# Patient Record
Sex: Female | Born: 1975 | Race: Black or African American | Hispanic: No | Marital: Single | Smoking: Former smoker
Health system: Southern US, Community
[De-identification: ages and names within clinical notes are randomized; demographics above are authoritative.]

## PROBLEM LIST (undated history)

## (undated) DIAGNOSIS — Z973 Presence of spectacles and contact lenses: Secondary | ICD-10-CM

## (undated) DIAGNOSIS — E1140 Type 2 diabetes mellitus with diabetic neuropathy, unspecified: Secondary | ICD-10-CM

## (undated) DIAGNOSIS — Z9889 Other specified postprocedural states: Secondary | ICD-10-CM

## (undated) DIAGNOSIS — F32A Depression, unspecified: Secondary | ICD-10-CM

## (undated) DIAGNOSIS — M79606 Pain in leg, unspecified: Secondary | ICD-10-CM

## (undated) DIAGNOSIS — F329 Major depressive disorder, single episode, unspecified: Secondary | ICD-10-CM

## (undated) DIAGNOSIS — N920 Excessive and frequent menstruation with regular cycle: Secondary | ICD-10-CM

## (undated) DIAGNOSIS — I10 Essential (primary) hypertension: Secondary | ICD-10-CM

## (undated) DIAGNOSIS — R0600 Dyspnea, unspecified: Secondary | ICD-10-CM

## (undated) DIAGNOSIS — R112 Nausea with vomiting, unspecified: Secondary | ICD-10-CM

## (undated) DIAGNOSIS — B009 Herpesviral infection, unspecified: Secondary | ICD-10-CM

## (undated) DIAGNOSIS — F8081 Childhood onset fluency disorder: Secondary | ICD-10-CM

## (undated) DIAGNOSIS — F419 Anxiety disorder, unspecified: Secondary | ICD-10-CM

## (undated) DIAGNOSIS — R269 Unspecified abnormalities of gait and mobility: Secondary | ICD-10-CM

## (undated) DIAGNOSIS — G8929 Other chronic pain: Secondary | ICD-10-CM

## (undated) DIAGNOSIS — E1110 Type 2 diabetes mellitus with ketoacidosis without coma: Secondary | ICD-10-CM

## (undated) DIAGNOSIS — R29898 Other symptoms and signs involving the musculoskeletal system: Secondary | ICD-10-CM

## (undated) HISTORY — DX: Anxiety disorder, unspecified: F41.9

## (undated) HISTORY — DX: Unspecified abnormalities of gait and mobility: R26.9

## (undated) HISTORY — DX: Depression, unspecified: F32.A

## (undated) HISTORY — DX: Childhood onset fluency disorder: F80.81

## (undated) HISTORY — DX: Herpesviral infection, unspecified: B00.9

## (undated) HISTORY — DX: Type 2 diabetes mellitus with ketoacidosis without coma: E11.10

## (undated) HISTORY — DX: Excessive and frequent menstruation with regular cycle: N92.0

## (undated) HISTORY — DX: Other symptoms and signs involving the musculoskeletal system: R29.898

## (undated) HISTORY — DX: Major depressive disorder, single episode, unspecified: F32.9

## (undated) HISTORY — DX: Pain in leg, unspecified: M79.606

## (undated) HISTORY — DX: Other chronic pain: G89.29

## (undated) HISTORY — PX: TUBAL LIGATION: SHX77

---

## 1998-02-02 ENCOUNTER — Emergency Department (HOSPITAL_COMMUNITY): Admission: EM | Admit: 1998-02-02 | Discharge: 1998-02-02 | Payer: Self-pay | Admitting: Emergency Medicine

## 1998-03-21 ENCOUNTER — Encounter: Admission: RE | Admit: 1998-03-21 | Discharge: 1998-03-21 | Payer: Self-pay | Admitting: Family Medicine

## 1998-06-27 ENCOUNTER — Emergency Department (HOSPITAL_COMMUNITY): Admission: EM | Admit: 1998-06-27 | Discharge: 1998-06-27 | Payer: Self-pay | Admitting: Emergency Medicine

## 1998-06-28 ENCOUNTER — Inpatient Hospital Stay (HOSPITAL_COMMUNITY): Admission: EM | Admit: 1998-06-28 | Discharge: 1998-07-01 | Payer: Self-pay | Admitting: Emergency Medicine

## 1998-06-28 ENCOUNTER — Encounter: Payer: Self-pay | Admitting: Emergency Medicine

## 1998-07-02 ENCOUNTER — Encounter: Admission: RE | Admit: 1998-07-02 | Discharge: 1998-09-30 | Payer: Self-pay | Admitting: *Deleted

## 1998-08-11 ENCOUNTER — Emergency Department (HOSPITAL_COMMUNITY): Admission: EM | Admit: 1998-08-11 | Discharge: 1998-08-11 | Payer: Self-pay | Admitting: Internal Medicine

## 1998-10-23 ENCOUNTER — Emergency Department (HOSPITAL_COMMUNITY): Admission: EM | Admit: 1998-10-23 | Discharge: 1998-10-23 | Payer: Self-pay | Admitting: Emergency Medicine

## 1998-10-25 ENCOUNTER — Encounter: Admission: RE | Admit: 1998-10-25 | Discharge: 1998-10-25 | Payer: Self-pay | Admitting: Internal Medicine

## 1998-11-06 ENCOUNTER — Encounter: Admission: RE | Admit: 1998-11-06 | Discharge: 1998-11-06 | Payer: Self-pay | Admitting: Internal Medicine

## 1998-11-06 ENCOUNTER — Ambulatory Visit (HOSPITAL_COMMUNITY): Admission: RE | Admit: 1998-11-06 | Discharge: 1998-11-06 | Payer: Self-pay | Admitting: Internal Medicine

## 1999-01-29 ENCOUNTER — Encounter: Admission: RE | Admit: 1999-01-29 | Discharge: 1999-01-29 | Payer: Self-pay | Admitting: Internal Medicine

## 1999-03-31 ENCOUNTER — Encounter: Payer: Self-pay | Admitting: Emergency Medicine

## 1999-03-31 ENCOUNTER — Emergency Department (HOSPITAL_COMMUNITY): Admission: EM | Admit: 1999-03-31 | Discharge: 1999-03-31 | Payer: Self-pay | Admitting: Emergency Medicine

## 1999-05-16 ENCOUNTER — Encounter: Admission: RE | Admit: 1999-05-16 | Discharge: 1999-08-14 | Payer: Self-pay | Admitting: Endocrinology

## 1999-07-30 ENCOUNTER — Other Ambulatory Visit: Admission: RE | Admit: 1999-07-30 | Discharge: 1999-07-30 | Payer: Self-pay | Admitting: Family Medicine

## 1999-08-22 ENCOUNTER — Encounter: Admission: RE | Admit: 1999-08-22 | Discharge: 1999-08-22 | Payer: Self-pay | Admitting: Family Medicine

## 1999-08-22 ENCOUNTER — Encounter: Payer: Self-pay | Admitting: Family Medicine

## 1999-08-24 ENCOUNTER — Inpatient Hospital Stay (HOSPITAL_COMMUNITY): Admission: EM | Admit: 1999-08-24 | Discharge: 1999-08-29 | Payer: Self-pay | Admitting: Emergency Medicine

## 1999-09-02 ENCOUNTER — Encounter: Admission: RE | Admit: 1999-09-02 | Discharge: 1999-12-01 | Payer: Self-pay | Admitting: Family Medicine

## 1999-10-30 ENCOUNTER — Inpatient Hospital Stay (HOSPITAL_COMMUNITY): Admission: EM | Admit: 1999-10-30 | Discharge: 1999-11-03 | Payer: Self-pay | Admitting: Family Medicine

## 1999-10-31 ENCOUNTER — Encounter: Payer: Self-pay | Admitting: Family Medicine

## 1999-10-31 ENCOUNTER — Encounter (HOSPITAL_BASED_OUTPATIENT_CLINIC_OR_DEPARTMENT_OTHER): Payer: Self-pay | Admitting: General Surgery

## 2000-02-02 ENCOUNTER — Inpatient Hospital Stay (HOSPITAL_COMMUNITY): Admission: AD | Admit: 2000-02-02 | Discharge: 2000-02-04 | Payer: Self-pay | Admitting: Obstetrics

## 2000-02-05 ENCOUNTER — Encounter: Admission: RE | Admit: 2000-02-05 | Discharge: 2000-02-05 | Payer: Self-pay | Admitting: Obstetrics

## 2000-02-05 ENCOUNTER — Encounter: Admission: RE | Admit: 2000-02-05 | Discharge: 2000-02-05 | Payer: Self-pay | Admitting: Hematology and Oncology

## 2000-02-08 ENCOUNTER — Emergency Department (HOSPITAL_COMMUNITY): Admission: EM | Admit: 2000-02-08 | Discharge: 2000-02-08 | Payer: Self-pay | Admitting: Emergency Medicine

## 2000-03-11 ENCOUNTER — Emergency Department (HOSPITAL_COMMUNITY): Admission: EM | Admit: 2000-03-11 | Discharge: 2000-03-11 | Payer: Self-pay | Admitting: Emergency Medicine

## 2000-03-22 ENCOUNTER — Inpatient Hospital Stay (HOSPITAL_COMMUNITY): Admission: EM | Admit: 2000-03-22 | Discharge: 2000-03-24 | Payer: Self-pay | Admitting: Emergency Medicine

## 2000-03-26 ENCOUNTER — Encounter: Admission: RE | Admit: 2000-03-26 | Discharge: 2000-03-26 | Payer: Self-pay | Admitting: Family Medicine

## 2000-05-05 ENCOUNTER — Encounter: Admission: RE | Admit: 2000-05-05 | Discharge: 2000-05-05 | Payer: Self-pay | Admitting: Family Medicine

## 2000-05-10 ENCOUNTER — Encounter: Admission: RE | Admit: 2000-05-10 | Discharge: 2000-05-10 | Payer: Self-pay | Admitting: Family Medicine

## 2000-07-23 ENCOUNTER — Emergency Department (HOSPITAL_COMMUNITY): Admission: EM | Admit: 2000-07-23 | Discharge: 2000-07-23 | Payer: Self-pay | Admitting: Emergency Medicine

## 2000-09-29 ENCOUNTER — Inpatient Hospital Stay (HOSPITAL_COMMUNITY): Admission: EM | Admit: 2000-09-29 | Discharge: 2000-09-30 | Payer: Self-pay | Admitting: Emergency Medicine

## 2000-11-13 ENCOUNTER — Encounter: Payer: Self-pay | Admitting: Emergency Medicine

## 2000-11-13 ENCOUNTER — Inpatient Hospital Stay (HOSPITAL_COMMUNITY): Admission: EM | Admit: 2000-11-13 | Discharge: 2000-11-16 | Payer: Self-pay | Admitting: Emergency Medicine

## 2000-11-19 ENCOUNTER — Encounter: Admission: RE | Admit: 2000-11-19 | Discharge: 2000-11-19 | Payer: Self-pay | Admitting: Family Medicine

## 2001-01-27 ENCOUNTER — Encounter: Admission: RE | Admit: 2001-01-27 | Discharge: 2001-01-27 | Payer: Self-pay | Admitting: Family Medicine

## 2001-01-27 ENCOUNTER — Ambulatory Visit (HOSPITAL_COMMUNITY): Admission: RE | Admit: 2001-01-27 | Discharge: 2001-01-27 | Payer: Self-pay | Admitting: Sports Medicine

## 2001-01-31 ENCOUNTER — Encounter: Admission: RE | Admit: 2001-01-31 | Discharge: 2001-01-31 | Payer: Self-pay | Admitting: Family Medicine

## 2001-02-02 ENCOUNTER — Encounter: Admission: RE | Admit: 2001-02-02 | Discharge: 2001-02-02 | Payer: Self-pay | Admitting: Family Medicine

## 2001-02-09 ENCOUNTER — Encounter: Admission: RE | Admit: 2001-02-09 | Discharge: 2001-02-09 | Payer: Self-pay | Admitting: Obstetrics & Gynecology

## 2001-02-16 ENCOUNTER — Observation Stay (HOSPITAL_COMMUNITY): Admission: AD | Admit: 2001-02-16 | Discharge: 2001-02-16 | Payer: Self-pay | Admitting: Obstetrics

## 2001-02-16 ENCOUNTER — Encounter: Admission: RE | Admit: 2001-02-16 | Discharge: 2001-02-16 | Payer: Self-pay | Admitting: Obstetrics & Gynecology

## 2001-03-02 ENCOUNTER — Encounter: Admission: RE | Admit: 2001-03-02 | Discharge: 2001-03-02 | Payer: Self-pay | Admitting: *Deleted

## 2001-03-09 ENCOUNTER — Encounter: Admission: RE | Admit: 2001-03-09 | Discharge: 2001-03-09 | Payer: Self-pay | Admitting: *Deleted

## 2001-03-16 ENCOUNTER — Encounter: Admission: RE | Admit: 2001-03-16 | Discharge: 2001-03-16 | Payer: Self-pay | Admitting: *Deleted

## 2001-03-23 ENCOUNTER — Encounter: Admission: RE | Admit: 2001-03-23 | Discharge: 2001-03-23 | Payer: Self-pay | Admitting: *Deleted

## 2001-03-24 ENCOUNTER — Ambulatory Visit (HOSPITAL_COMMUNITY): Admission: RE | Admit: 2001-03-24 | Discharge: 2001-03-24 | Payer: Self-pay | Admitting: *Deleted

## 2001-03-30 ENCOUNTER — Encounter: Admission: RE | Admit: 2001-03-30 | Discharge: 2001-03-30 | Payer: Self-pay | Admitting: *Deleted

## 2001-04-06 ENCOUNTER — Encounter: Admission: RE | Admit: 2001-04-06 | Discharge: 2001-04-06 | Payer: Self-pay | Admitting: *Deleted

## 2001-04-10 ENCOUNTER — Inpatient Hospital Stay (HOSPITAL_COMMUNITY): Admission: AD | Admit: 2001-04-10 | Discharge: 2001-04-10 | Payer: Self-pay | Admitting: *Deleted

## 2001-04-12 ENCOUNTER — Ambulatory Visit (HOSPITAL_COMMUNITY): Admission: RE | Admit: 2001-04-12 | Discharge: 2001-04-12 | Payer: Self-pay | Admitting: *Deleted

## 2001-04-20 ENCOUNTER — Encounter: Admission: RE | Admit: 2001-04-20 | Discharge: 2001-04-20 | Payer: Self-pay | Admitting: *Deleted

## 2001-04-27 ENCOUNTER — Encounter: Admission: RE | Admit: 2001-04-27 | Discharge: 2001-04-27 | Payer: Self-pay | Admitting: *Deleted

## 2001-04-28 ENCOUNTER — Inpatient Hospital Stay (HOSPITAL_COMMUNITY): Admission: AD | Admit: 2001-04-28 | Discharge: 2001-04-30 | Payer: Self-pay | Admitting: *Deleted

## 2001-05-04 ENCOUNTER — Encounter: Admission: RE | Admit: 2001-05-04 | Discharge: 2001-05-04 | Payer: Self-pay | Admitting: *Deleted

## 2001-05-11 ENCOUNTER — Encounter: Admission: RE | Admit: 2001-05-11 | Discharge: 2001-05-11 | Payer: Self-pay | Admitting: *Deleted

## 2001-05-19 ENCOUNTER — Encounter: Admission: RE | Admit: 2001-05-19 | Discharge: 2001-05-19 | Payer: Self-pay | Admitting: *Deleted

## 2001-05-24 ENCOUNTER — Ambulatory Visit (HOSPITAL_COMMUNITY): Admission: RE | Admit: 2001-05-24 | Discharge: 2001-05-24 | Payer: Self-pay | Admitting: *Deleted

## 2001-05-25 ENCOUNTER — Encounter: Admission: RE | Admit: 2001-05-25 | Discharge: 2001-05-25 | Payer: Self-pay | Admitting: *Deleted

## 2001-06-01 ENCOUNTER — Encounter: Admission: RE | Admit: 2001-06-01 | Discharge: 2001-06-01 | Payer: Self-pay | Admitting: *Deleted

## 2001-06-08 ENCOUNTER — Encounter: Admission: RE | Admit: 2001-06-08 | Discharge: 2001-06-08 | Payer: Self-pay | Admitting: *Deleted

## 2001-06-15 ENCOUNTER — Observation Stay (HOSPITAL_COMMUNITY): Admission: AD | Admit: 2001-06-15 | Discharge: 2001-06-16 | Payer: Self-pay | Admitting: Obstetrics and Gynecology

## 2001-06-23 ENCOUNTER — Encounter: Admission: RE | Admit: 2001-06-23 | Discharge: 2001-06-23 | Payer: Self-pay | Admitting: *Deleted

## 2001-06-27 ENCOUNTER — Encounter: Admission: RE | Admit: 2001-06-27 | Discharge: 2001-06-27 | Payer: Self-pay | Admitting: Family Medicine

## 2001-06-29 ENCOUNTER — Encounter (HOSPITAL_COMMUNITY): Admission: RE | Admit: 2001-06-29 | Discharge: 2001-07-29 | Payer: Self-pay | Admitting: *Deleted

## 2001-06-29 ENCOUNTER — Encounter: Admission: RE | Admit: 2001-06-29 | Discharge: 2001-06-29 | Payer: Self-pay | Admitting: *Deleted

## 2001-07-07 ENCOUNTER — Encounter: Admission: RE | Admit: 2001-07-07 | Discharge: 2001-07-07 | Payer: Self-pay | Admitting: *Deleted

## 2001-07-10 ENCOUNTER — Inpatient Hospital Stay (HOSPITAL_COMMUNITY): Admission: AD | Admit: 2001-07-10 | Discharge: 2001-07-10 | Payer: Self-pay | Admitting: *Deleted

## 2001-07-13 ENCOUNTER — Encounter: Admission: RE | Admit: 2001-07-13 | Discharge: 2001-07-13 | Payer: Self-pay | Admitting: *Deleted

## 2001-07-20 ENCOUNTER — Encounter: Admission: RE | Admit: 2001-07-20 | Discharge: 2001-07-20 | Payer: Self-pay | Admitting: *Deleted

## 2001-07-21 ENCOUNTER — Encounter: Admission: RE | Admit: 2001-07-21 | Discharge: 2001-07-21 | Payer: Self-pay | Admitting: *Deleted

## 2001-07-21 ENCOUNTER — Inpatient Hospital Stay (HOSPITAL_COMMUNITY): Admission: AD | Admit: 2001-07-21 | Discharge: 2001-07-21 | Payer: Self-pay | Admitting: *Deleted

## 2001-07-27 ENCOUNTER — Encounter: Admission: RE | Admit: 2001-07-27 | Discharge: 2001-07-27 | Payer: Self-pay | Admitting: *Deleted

## 2001-08-02 ENCOUNTER — Encounter (HOSPITAL_COMMUNITY): Admission: RE | Admit: 2001-08-02 | Discharge: 2001-08-04 | Payer: Self-pay | Admitting: *Deleted

## 2001-08-05 ENCOUNTER — Inpatient Hospital Stay (HOSPITAL_COMMUNITY): Admission: AD | Admit: 2001-08-05 | Discharge: 2001-08-09 | Payer: Self-pay | Admitting: *Deleted

## 2001-09-09 ENCOUNTER — Encounter: Admission: RE | Admit: 2001-09-09 | Discharge: 2001-09-09 | Payer: Self-pay | Admitting: Family Medicine

## 2001-09-30 ENCOUNTER — Other Ambulatory Visit: Admission: RE | Admit: 2001-09-30 | Discharge: 2001-09-30 | Payer: Self-pay | Admitting: Family Medicine

## 2001-09-30 ENCOUNTER — Encounter: Admission: RE | Admit: 2001-09-30 | Discharge: 2001-09-30 | Payer: Self-pay | Admitting: Family Medicine

## 2001-12-28 ENCOUNTER — Encounter: Admission: RE | Admit: 2001-12-28 | Discharge: 2001-12-28 | Payer: Self-pay | Admitting: Family Medicine

## 2002-01-05 ENCOUNTER — Encounter: Admission: RE | Admit: 2002-01-05 | Discharge: 2002-01-05 | Payer: Self-pay | Admitting: Family Medicine

## 2002-01-10 ENCOUNTER — Encounter: Admission: RE | Admit: 2002-01-10 | Discharge: 2002-01-10 | Payer: Self-pay | Admitting: Family Medicine

## 2002-01-17 ENCOUNTER — Encounter: Admission: RE | Admit: 2002-01-17 | Discharge: 2002-01-17 | Payer: Self-pay | Admitting: Family Medicine

## 2002-01-18 ENCOUNTER — Ambulatory Visit (HOSPITAL_COMMUNITY): Admission: RE | Admit: 2002-01-18 | Discharge: 2002-01-18 | Payer: Self-pay | Admitting: Family Medicine

## 2002-01-18 ENCOUNTER — Encounter: Payer: Self-pay | Admitting: Family Medicine

## 2002-01-19 ENCOUNTER — Encounter (INDEPENDENT_AMBULATORY_CARE_PROVIDER_SITE_OTHER): Payer: Self-pay | Admitting: *Deleted

## 2002-01-19 HISTORY — PX: TUBAL LIGATION: SHX77

## 2002-01-19 LAB — CONVERTED CEMR LAB

## 2002-01-20 ENCOUNTER — Inpatient Hospital Stay (HOSPITAL_COMMUNITY): Admission: AD | Admit: 2002-01-20 | Discharge: 2002-01-25 | Payer: Self-pay | Admitting: Family Medicine

## 2002-01-20 ENCOUNTER — Encounter: Admission: RE | Admit: 2002-01-20 | Discharge: 2002-01-20 | Payer: Self-pay | Admitting: Family Medicine

## 2002-01-26 ENCOUNTER — Encounter: Admission: RE | Admit: 2002-01-26 | Discharge: 2002-04-26 | Payer: Self-pay | Admitting: Family Medicine

## 2002-02-01 ENCOUNTER — Encounter: Admission: RE | Admit: 2002-02-01 | Discharge: 2002-02-01 | Payer: Self-pay | Admitting: *Deleted

## 2002-02-15 ENCOUNTER — Encounter: Admission: RE | Admit: 2002-02-15 | Discharge: 2002-02-15 | Payer: Self-pay | Admitting: *Deleted

## 2002-02-22 ENCOUNTER — Encounter: Admission: RE | Admit: 2002-02-22 | Discharge: 2002-02-22 | Payer: Self-pay | Admitting: *Deleted

## 2002-03-08 ENCOUNTER — Encounter: Admission: RE | Admit: 2002-03-08 | Discharge: 2002-03-08 | Payer: Self-pay | Admitting: *Deleted

## 2002-03-13 ENCOUNTER — Emergency Department (HOSPITAL_COMMUNITY): Admission: EM | Admit: 2002-03-13 | Discharge: 2002-03-13 | Payer: Self-pay | Admitting: Emergency Medicine

## 2002-03-15 ENCOUNTER — Encounter: Admission: RE | Admit: 2002-03-15 | Discharge: 2002-03-15 | Payer: Self-pay | Admitting: *Deleted

## 2002-04-18 ENCOUNTER — Encounter: Payer: Self-pay | Admitting: *Deleted

## 2002-04-18 ENCOUNTER — Ambulatory Visit (HOSPITAL_COMMUNITY): Admission: RE | Admit: 2002-04-18 | Discharge: 2002-04-18 | Payer: Self-pay | Admitting: *Deleted

## 2002-04-19 ENCOUNTER — Encounter: Admission: RE | Admit: 2002-04-19 | Discharge: 2002-04-19 | Payer: Self-pay | Admitting: *Deleted

## 2002-04-26 ENCOUNTER — Encounter: Admission: RE | Admit: 2002-04-26 | Discharge: 2002-04-26 | Payer: Self-pay | Admitting: *Deleted

## 2002-05-03 ENCOUNTER — Encounter: Admission: RE | Admit: 2002-05-03 | Discharge: 2002-05-03 | Payer: Self-pay | Admitting: *Deleted

## 2002-05-31 ENCOUNTER — Encounter: Admission: RE | Admit: 2002-05-31 | Discharge: 2002-05-31 | Payer: Self-pay | Admitting: *Deleted

## 2002-06-07 ENCOUNTER — Encounter: Admission: RE | Admit: 2002-06-07 | Discharge: 2002-06-07 | Payer: Self-pay | Admitting: Obstetrics and Gynecology

## 2002-06-21 ENCOUNTER — Encounter: Admission: RE | Admit: 2002-06-21 | Discharge: 2002-06-21 | Payer: Self-pay | Admitting: *Deleted

## 2002-06-21 ENCOUNTER — Ambulatory Visit (HOSPITAL_COMMUNITY): Admission: RE | Admit: 2002-06-21 | Discharge: 2002-06-21 | Payer: Self-pay | Admitting: *Deleted

## 2002-06-28 ENCOUNTER — Encounter: Admission: RE | Admit: 2002-06-28 | Discharge: 2002-06-28 | Payer: Self-pay | Admitting: *Deleted

## 2002-07-05 ENCOUNTER — Ambulatory Visit (HOSPITAL_COMMUNITY): Admission: RE | Admit: 2002-07-05 | Discharge: 2002-07-05 | Payer: Self-pay | Admitting: *Deleted

## 2002-07-05 ENCOUNTER — Encounter: Admission: RE | Admit: 2002-07-05 | Discharge: 2002-07-05 | Payer: Self-pay | Admitting: *Deleted

## 2002-07-26 ENCOUNTER — Encounter: Admission: RE | Admit: 2002-07-26 | Discharge: 2002-07-26 | Payer: Self-pay | Admitting: *Deleted

## 2002-08-02 ENCOUNTER — Encounter: Admission: RE | Admit: 2002-08-02 | Discharge: 2002-08-02 | Payer: Self-pay | Admitting: *Deleted

## 2002-08-09 ENCOUNTER — Encounter: Admission: RE | Admit: 2002-08-09 | Discharge: 2002-08-09 | Payer: Self-pay | Admitting: *Deleted

## 2002-08-13 ENCOUNTER — Inpatient Hospital Stay (HOSPITAL_COMMUNITY): Admission: AD | Admit: 2002-08-13 | Discharge: 2002-08-13 | Payer: Self-pay | Admitting: Obstetrics & Gynecology

## 2002-08-16 ENCOUNTER — Encounter: Admission: RE | Admit: 2002-08-16 | Discharge: 2002-08-16 | Payer: Self-pay | Admitting: *Deleted

## 2002-08-16 ENCOUNTER — Encounter: Payer: Self-pay | Admitting: Obstetrics and Gynecology

## 2002-08-16 ENCOUNTER — Inpatient Hospital Stay (HOSPITAL_COMMUNITY): Admission: AD | Admit: 2002-08-16 | Discharge: 2002-08-18 | Payer: Self-pay | Admitting: Obstetrics and Gynecology

## 2002-08-23 ENCOUNTER — Encounter: Admission: RE | Admit: 2002-08-23 | Discharge: 2002-08-23 | Payer: Self-pay | Admitting: *Deleted

## 2002-08-28 ENCOUNTER — Encounter: Admission: RE | Admit: 2002-08-28 | Discharge: 2002-08-28 | Payer: Self-pay | Admitting: *Deleted

## 2002-08-30 ENCOUNTER — Encounter: Admission: RE | Admit: 2002-08-30 | Discharge: 2002-08-30 | Payer: Self-pay | Admitting: *Deleted

## 2002-09-03 ENCOUNTER — Inpatient Hospital Stay (HOSPITAL_COMMUNITY): Admission: AD | Admit: 2002-09-03 | Discharge: 2002-09-03 | Payer: Self-pay | Admitting: Family Medicine

## 2002-09-04 ENCOUNTER — Encounter: Admission: RE | Admit: 2002-09-04 | Discharge: 2002-09-04 | Payer: Self-pay | Admitting: *Deleted

## 2002-09-06 ENCOUNTER — Encounter: Admission: RE | Admit: 2002-09-06 | Discharge: 2002-09-06 | Payer: Self-pay | Admitting: *Deleted

## 2002-09-06 ENCOUNTER — Inpatient Hospital Stay (HOSPITAL_COMMUNITY): Admission: AD | Admit: 2002-09-06 | Discharge: 2002-09-09 | Payer: Self-pay | Admitting: Obstetrics and Gynecology

## 2002-09-22 ENCOUNTER — Encounter: Admission: RE | Admit: 2002-09-22 | Discharge: 2002-09-22 | Payer: Self-pay | Admitting: Family Medicine

## 2002-10-25 ENCOUNTER — Encounter: Admission: RE | Admit: 2002-10-25 | Discharge: 2002-10-25 | Payer: Self-pay | Admitting: Family Medicine

## 2002-11-08 ENCOUNTER — Encounter: Admission: RE | Admit: 2002-11-08 | Discharge: 2002-11-08 | Payer: Self-pay | Admitting: Family Medicine

## 2003-03-26 ENCOUNTER — Emergency Department (HOSPITAL_COMMUNITY): Admission: EM | Admit: 2003-03-26 | Discharge: 2003-03-26 | Payer: Self-pay | Admitting: Emergency Medicine

## 2003-05-25 ENCOUNTER — Encounter: Admission: RE | Admit: 2003-05-25 | Discharge: 2003-05-25 | Payer: Self-pay | Admitting: Family Medicine

## 2003-12-03 ENCOUNTER — Emergency Department (HOSPITAL_COMMUNITY): Admission: EM | Admit: 2003-12-03 | Discharge: 2003-12-03 | Payer: Self-pay | Admitting: Emergency Medicine

## 2004-07-08 ENCOUNTER — Emergency Department (HOSPITAL_COMMUNITY): Admission: EM | Admit: 2004-07-08 | Discharge: 2004-07-08 | Payer: Self-pay | Admitting: Emergency Medicine

## 2004-07-22 ENCOUNTER — Emergency Department (HOSPITAL_COMMUNITY): Admission: EM | Admit: 2004-07-22 | Discharge: 2004-07-22 | Payer: Self-pay | Admitting: Emergency Medicine

## 2004-10-21 ENCOUNTER — Ambulatory Visit: Payer: Self-pay | Admitting: Sports Medicine

## 2004-11-04 ENCOUNTER — Ambulatory Visit: Payer: Self-pay | Admitting: Family Medicine

## 2005-01-08 ENCOUNTER — Inpatient Hospital Stay (HOSPITAL_COMMUNITY): Admission: EM | Admit: 2005-01-08 | Discharge: 2005-01-09 | Payer: Self-pay | Admitting: Emergency Medicine

## 2005-01-08 ENCOUNTER — Ambulatory Visit: Payer: Self-pay | Admitting: Family Medicine

## 2005-01-15 ENCOUNTER — Ambulatory Visit: Payer: Self-pay | Admitting: Sports Medicine

## 2005-02-18 ENCOUNTER — Ambulatory Visit: Payer: Self-pay | Admitting: Family Medicine

## 2005-03-03 ENCOUNTER — Ambulatory Visit: Payer: Self-pay | Admitting: Family Medicine

## 2005-04-17 ENCOUNTER — Ambulatory Visit: Payer: Self-pay | Admitting: Sports Medicine

## 2005-04-21 ENCOUNTER — Emergency Department (HOSPITAL_COMMUNITY): Admission: EM | Admit: 2005-04-21 | Discharge: 2005-04-21 | Payer: Self-pay | Admitting: Emergency Medicine

## 2005-08-08 ENCOUNTER — Emergency Department (HOSPITAL_COMMUNITY): Admission: EM | Admit: 2005-08-08 | Discharge: 2005-08-08 | Payer: Self-pay | Admitting: Emergency Medicine

## 2005-08-14 ENCOUNTER — Ambulatory Visit: Payer: Self-pay | Admitting: Sports Medicine

## 2006-02-10 ENCOUNTER — Emergency Department (HOSPITAL_COMMUNITY): Admission: EM | Admit: 2006-02-10 | Discharge: 2006-02-10 | Payer: Self-pay | Admitting: Emergency Medicine

## 2006-03-18 DIAGNOSIS — G43909 Migraine, unspecified, not intractable, without status migrainosus: Secondary | ICD-10-CM | POA: Insufficient documentation

## 2006-03-18 DIAGNOSIS — E109 Type 1 diabetes mellitus without complications: Secondary | ICD-10-CM | POA: Insufficient documentation

## 2006-03-18 DIAGNOSIS — F339 Major depressive disorder, recurrent, unspecified: Secondary | ICD-10-CM | POA: Insufficient documentation

## 2006-03-19 ENCOUNTER — Encounter (INDEPENDENT_AMBULATORY_CARE_PROVIDER_SITE_OTHER): Payer: Self-pay | Admitting: *Deleted

## 2006-06-01 ENCOUNTER — Ambulatory Visit: Payer: Self-pay | Admitting: Family Medicine

## 2006-06-01 ENCOUNTER — Encounter (INDEPENDENT_AMBULATORY_CARE_PROVIDER_SITE_OTHER): Payer: Self-pay | Admitting: *Deleted

## 2006-06-02 ENCOUNTER — Telehealth: Payer: Self-pay | Admitting: Family Medicine

## 2006-06-10 ENCOUNTER — Encounter (INDEPENDENT_AMBULATORY_CARE_PROVIDER_SITE_OTHER): Payer: Self-pay | Admitting: *Deleted

## 2006-06-28 ENCOUNTER — Encounter: Payer: Self-pay | Admitting: Family Medicine

## 2006-06-30 ENCOUNTER — Telehealth: Payer: Self-pay | Admitting: *Deleted

## 2006-07-24 ENCOUNTER — Emergency Department (HOSPITAL_COMMUNITY): Admission: EM | Admit: 2006-07-24 | Discharge: 2006-07-25 | Payer: Self-pay | Admitting: Emergency Medicine

## 2006-08-02 ENCOUNTER — Ambulatory Visit: Payer: Self-pay | Admitting: Family Medicine

## 2006-08-02 ENCOUNTER — Encounter: Payer: Self-pay | Admitting: Family Medicine

## 2006-08-02 LAB — CONVERTED CEMR LAB
BUN: 9 mg/dL (ref 6–23)
CO2: 23 meq/L (ref 19–32)
Calcium: 9 mg/dL (ref 8.4–10.5)
Chloride: 106 meq/L (ref 96–112)
Creatinine, Ser: 0.72 mg/dL (ref 0.40–1.20)
Glucose, Bld: 174 mg/dL — ABNORMAL HIGH (ref 70–99)
Hgb A1c MFr Bld: 7.5 %
Potassium: 4.2 meq/L (ref 3.5–5.3)
Sodium: 140 meq/L (ref 135–145)

## 2006-08-10 ENCOUNTER — Telehealth (INDEPENDENT_AMBULATORY_CARE_PROVIDER_SITE_OTHER): Payer: Self-pay | Admitting: *Deleted

## 2006-08-16 ENCOUNTER — Encounter: Payer: Self-pay | Admitting: Family Medicine

## 2006-09-16 ENCOUNTER — Encounter (INDEPENDENT_AMBULATORY_CARE_PROVIDER_SITE_OTHER): Payer: Self-pay | Admitting: *Deleted

## 2006-11-28 ENCOUNTER — Telehealth (INDEPENDENT_AMBULATORY_CARE_PROVIDER_SITE_OTHER): Payer: Self-pay | Admitting: Family Medicine

## 2007-04-20 ENCOUNTER — Telehealth: Payer: Self-pay | Admitting: *Deleted

## 2007-04-20 ENCOUNTER — Ambulatory Visit: Payer: Self-pay | Admitting: Family Medicine

## 2007-04-20 ENCOUNTER — Encounter (INDEPENDENT_AMBULATORY_CARE_PROVIDER_SITE_OTHER): Payer: Self-pay | Admitting: *Deleted

## 2007-04-20 LAB — CONVERTED CEMR LAB
BUN: 8 mg/dL (ref 6–23)
Basophils Absolute: 0 10*3/uL (ref 0.0–0.1)
Basophils Relative: 0 % (ref 0–1)
Bilirubin Urine: NEGATIVE
Blood Glucose, Fingerstick: 314
CO2: 24 meq/L (ref 19–32)
Calcium: 8.7 mg/dL (ref 8.4–10.5)
Chloride: 103 meq/L (ref 96–112)
Creatinine, Ser: 0.54 mg/dL (ref 0.40–1.20)
Eosinophils Absolute: 0.2 10*3/uL (ref 0.0–0.7)
Eosinophils Relative: 2 % (ref 0–5)
Glucose, Bld: 262 mg/dL — ABNORMAL HIGH (ref 70–99)
Glucose, Urine, Semiquant: 500
HCT: 34 % — ABNORMAL LOW (ref 36.0–46.0)
Hemoglobin: 11.1 g/dL — ABNORMAL LOW (ref 12.0–15.0)
Hgb A1c MFr Bld: 9.5 %
Ketones, urine, test strip: NEGATIVE
Lymphocytes Relative: 43 % (ref 12–46)
Lymphs Abs: 3.9 10*3/uL (ref 0.7–4.0)
MCHC: 32.6 g/dL (ref 30.0–36.0)
MCV: 89.9 fL (ref 78.0–100.0)
Monocytes Absolute: 0.6 10*3/uL (ref 0.1–1.0)
Monocytes Relative: 7 % (ref 3–12)
Neutro Abs: 4.3 10*3/uL (ref 1.7–7.7)
Neutrophils Relative %: 48 % (ref 43–77)
Nitrite: POSITIVE
Platelets: 388 10*3/uL (ref 150–400)
Potassium: 4.1 meq/L (ref 3.5–5.3)
Protein, U semiquant: NEGATIVE
RBC: 3.78 M/uL — ABNORMAL LOW (ref 3.87–5.11)
RDW: 12.9 % (ref 11.5–15.5)
Sodium: 138 meq/L (ref 135–145)
Specific Gravity, Urine: 1.015
Urobilinogen, UA: 0.2
WBC Urine, dipstick: NEGATIVE
WBC: 9 10*3/uL (ref 4.0–10.5)
pH: 7

## 2007-04-21 ENCOUNTER — Telehealth: Payer: Self-pay | Admitting: *Deleted

## 2007-08-09 ENCOUNTER — Emergency Department (HOSPITAL_COMMUNITY): Admission: EM | Admit: 2007-08-09 | Discharge: 2007-08-09 | Payer: Self-pay | Admitting: Emergency Medicine

## 2007-10-20 ENCOUNTER — Encounter: Payer: Self-pay | Admitting: Family Medicine

## 2007-11-08 ENCOUNTER — Encounter (INDEPENDENT_AMBULATORY_CARE_PROVIDER_SITE_OTHER): Payer: Self-pay | Admitting: *Deleted

## 2008-02-27 ENCOUNTER — Observation Stay (HOSPITAL_COMMUNITY): Admission: EM | Admit: 2008-02-27 | Discharge: 2008-02-27 | Payer: Self-pay | Admitting: Emergency Medicine

## 2008-02-27 ENCOUNTER — Telehealth: Payer: Self-pay | Admitting: Family Medicine

## 2008-02-28 ENCOUNTER — Encounter: Payer: Self-pay | Admitting: *Deleted

## 2008-02-28 ENCOUNTER — Encounter (INDEPENDENT_AMBULATORY_CARE_PROVIDER_SITE_OTHER): Payer: Self-pay | Admitting: Family Medicine

## 2008-02-28 ENCOUNTER — Telehealth: Payer: Self-pay | Admitting: Family Medicine

## 2008-02-28 ENCOUNTER — Ambulatory Visit: Payer: Self-pay | Admitting: Family Medicine

## 2008-02-28 LAB — CONVERTED CEMR LAB
ALT: 8 units/L (ref 0–35)
AST: 9 units/L (ref 0–37)
Albumin: 3.9 g/dL (ref 3.5–5.2)
Alkaline Phosphatase: 92 units/L (ref 39–117)
BUN: 9 mg/dL (ref 6–23)
CO2: 24 meq/L (ref 19–32)
Calcium: 8.8 mg/dL (ref 8.4–10.5)
Chloride: 104 meq/L (ref 96–112)
Creatinine, Ser: 0.68 mg/dL (ref 0.40–1.20)
Glucose, Bld: 213 mg/dL — ABNORMAL HIGH (ref 70–99)
Hgb A1c MFr Bld: 8.7 %
Potassium: 4.2 meq/L (ref 3.5–5.3)
Sodium: 135 meq/L (ref 135–145)
Total Bilirubin: 0.3 mg/dL (ref 0.3–1.2)
Total Protein: 6.9 g/dL (ref 6.0–8.3)

## 2008-02-29 ENCOUNTER — Encounter (INDEPENDENT_AMBULATORY_CARE_PROVIDER_SITE_OTHER): Payer: Self-pay | Admitting: Family Medicine

## 2008-04-06 ENCOUNTER — Telehealth: Payer: Self-pay | Admitting: Family Medicine

## 2008-04-09 ENCOUNTER — Telehealth: Payer: Self-pay | Admitting: Family Medicine

## 2008-05-08 ENCOUNTER — Emergency Department (HOSPITAL_COMMUNITY): Admission: EM | Admit: 2008-05-08 | Discharge: 2008-05-08 | Payer: Self-pay | Admitting: Emergency Medicine

## 2008-05-15 ENCOUNTER — Encounter (INDEPENDENT_AMBULATORY_CARE_PROVIDER_SITE_OTHER): Payer: Self-pay | Admitting: Family Medicine

## 2008-05-15 ENCOUNTER — Ambulatory Visit: Payer: Self-pay | Admitting: Family Medicine

## 2008-05-15 ENCOUNTER — Encounter: Payer: Self-pay | Admitting: Family Medicine

## 2008-05-15 ENCOUNTER — Other Ambulatory Visit: Admission: RE | Admit: 2008-05-15 | Discharge: 2008-05-15 | Payer: Self-pay | Admitting: Family Medicine

## 2008-05-15 DIAGNOSIS — R110 Nausea: Secondary | ICD-10-CM | POA: Insufficient documentation

## 2008-05-15 DIAGNOSIS — N644 Mastodynia: Secondary | ICD-10-CM | POA: Insufficient documentation

## 2008-05-15 LAB — CONVERTED CEMR LAB
Beta hcg, urine, semiquantitative: NEGATIVE
Bilirubin Urine: NEGATIVE
Blood in Urine, dipstick: NEGATIVE
Glucose, Urine, Semiquant: 500
Hgb A1c MFr Bld: 9 %
Ketones, urine, test strip: NEGATIVE
Nitrite: NEGATIVE
Protein, U semiquant: NEGATIVE
Specific Gravity, Urine: >=1.03
Urobilinogen, UA: 0.2
WBC Urine, dipstick: NEGATIVE
Whiff Test: POSITIVE
pH: 5.5

## 2008-05-16 ENCOUNTER — Encounter: Payer: Self-pay | Admitting: Family Medicine

## 2008-05-16 LAB — CONVERTED CEMR LAB
ALT: 9 units/L (ref 0–35)
AST: 10 units/L (ref 0–37)
Albumin: 3.7 g/dL (ref 3.5–5.2)
Alkaline Phosphatase: 81 units/L (ref 39–117)
BUN: 11 mg/dL (ref 6–23)
Basophils Absolute: 0 10*3/uL (ref 0.0–0.1)
Basophils Relative: 0 % (ref 0–1)
CO2: 23 meq/L (ref 19–32)
Calcium: 8.7 mg/dL (ref 8.4–10.5)
Chlamydia, DNA Probe: NEGATIVE
Chloride: 104 meq/L (ref 96–112)
Creatinine, Ser: 0.67 mg/dL (ref 0.40–1.20)
Eosinophils Absolute: 0.2 10*3/uL (ref 0.0–0.7)
Eosinophils Relative: 3 % (ref 0–5)
GC Probe Amp, Genital: NEGATIVE
Glucose, Bld: 356 mg/dL — ABNORMAL HIGH (ref 70–99)
HCT: 32.5 % — ABNORMAL LOW (ref 36.0–46.0)
Hemoglobin: 10.8 g/dL — ABNORMAL LOW (ref 12.0–15.0)
Lymphocytes Relative: 41 % (ref 12–46)
Lymphs Abs: 3.1 10*3/uL (ref 0.7–4.0)
MCHC: 33.2 g/dL (ref 30.0–36.0)
MCV: 88.8 fL (ref 78.0–100.0)
Monocytes Absolute: 0.4 10*3/uL (ref 0.1–1.0)
Monocytes Relative: 5 % (ref 3–12)
Neutro Abs: 3.9 10*3/uL (ref 1.7–7.7)
Neutrophils Relative %: 51 % (ref 43–77)
Platelets: 462 10*3/uL — ABNORMAL HIGH (ref 150–400)
Potassium: 4.3 meq/L (ref 3.5–5.3)
RBC: 3.66 M/uL — ABNORMAL LOW (ref 3.87–5.11)
RDW: 12.3 % (ref 11.5–15.5)
Sodium: 138 meq/L (ref 135–145)
Total Bilirubin: 0.3 mg/dL (ref 0.3–1.2)
Total Protein: 6.7 g/dL (ref 6.0–8.3)
WBC: 7.6 10*3/uL (ref 4.0–10.5)

## 2008-05-18 ENCOUNTER — Encounter: Payer: Self-pay | Admitting: *Deleted

## 2008-05-18 ENCOUNTER — Telehealth: Payer: Self-pay | Admitting: Family Medicine

## 2008-05-18 ENCOUNTER — Ambulatory Visit: Payer: Self-pay | Admitting: Family Medicine

## 2008-05-18 DIAGNOSIS — R109 Unspecified abdominal pain: Secondary | ICD-10-CM | POA: Insufficient documentation

## 2008-05-18 LAB — CONVERTED CEMR LAB: Beta hcg, urine, semiquantitative: NEGATIVE

## 2008-05-21 ENCOUNTER — Encounter: Payer: Self-pay | Admitting: Family Medicine

## 2008-05-31 ENCOUNTER — Ambulatory Visit: Payer: Self-pay | Admitting: Family Medicine

## 2008-05-31 DIAGNOSIS — N898 Other specified noninflammatory disorders of vagina: Secondary | ICD-10-CM | POA: Insufficient documentation

## 2008-05-31 LAB — CONVERTED CEMR LAB
Bilirubin Urine: NEGATIVE
Blood in Urine, dipstick: NEGATIVE
Glucose, Urine, Semiquant: NEGATIVE
Ketones, urine, test strip: NEGATIVE
Nitrite: NEGATIVE
Protein, U semiquant: 30
Specific Gravity, Urine: 1.02
Urobilinogen, UA: 0.2
Whiff Test: POSITIVE
pH: 7

## 2008-06-01 ENCOUNTER — Encounter: Payer: Self-pay | Admitting: Family Medicine

## 2008-06-04 ENCOUNTER — Telehealth: Payer: Self-pay | Admitting: Family Medicine

## 2008-06-08 ENCOUNTER — Telehealth: Payer: Self-pay | Admitting: Family Medicine

## 2008-06-12 ENCOUNTER — Encounter: Payer: Self-pay | Admitting: Family Medicine

## 2008-08-29 ENCOUNTER — Emergency Department (HOSPITAL_COMMUNITY): Admission: EM | Admit: 2008-08-29 | Discharge: 2008-08-30 | Payer: Self-pay | Admitting: Emergency Medicine

## 2008-09-20 ENCOUNTER — Observation Stay (HOSPITAL_COMMUNITY): Admission: EM | Admit: 2008-09-20 | Discharge: 2008-09-20 | Payer: Self-pay | Admitting: Emergency Medicine

## 2008-10-27 ENCOUNTER — Encounter (INDEPENDENT_AMBULATORY_CARE_PROVIDER_SITE_OTHER): Payer: Self-pay | Admitting: *Deleted

## 2008-10-27 DIAGNOSIS — F17200 Nicotine dependence, unspecified, uncomplicated: Secondary | ICD-10-CM | POA: Insufficient documentation

## 2008-11-20 ENCOUNTER — Encounter: Payer: Self-pay | Admitting: Family Medicine

## 2009-08-07 ENCOUNTER — Emergency Department (HOSPITAL_COMMUNITY): Admission: EM | Admit: 2009-08-07 | Discharge: 2009-08-07 | Payer: Self-pay | Admitting: Emergency Medicine

## 2009-12-09 ENCOUNTER — Emergency Department (HOSPITAL_COMMUNITY): Admission: EM | Admit: 2009-12-09 | Discharge: 2009-12-09 | Payer: Self-pay | Admitting: Emergency Medicine

## 2009-12-24 ENCOUNTER — Emergency Department (HOSPITAL_COMMUNITY)
Admission: EM | Admit: 2009-12-24 | Discharge: 2009-12-24 | Payer: Self-pay | Source: Home / Self Care | Admitting: Emergency Medicine

## 2010-01-19 DIAGNOSIS — B009 Herpesviral infection, unspecified: Secondary | ICD-10-CM

## 2010-01-19 HISTORY — DX: Herpesviral infection, unspecified: B00.9

## 2010-02-18 NOTE — Progress Notes (Signed)
  went to ED. K+ was low, dehydrated, sugar was high. they gave her insulin. has laryngitis. not feeling any better today. appt for today .Marland KitchenNe[facility] CALDWELL RN  February 28, 2008 10:52 AM

## 2010-02-18 NOTE — Miscellaneous (Signed)
Summary: ED Discharge Summary   Clinical Lists Changes  MRN:  [id number]        Account #: [id number]   Name: Barbara Clark, Barbara Clark       Sex: F   Age: 35         DOB: 15-May-1975   Complaint: Nausea and vomiting      Primary Diagnosis: Hyperglycemia   Arrival Time: 02/27/2008 11:26      Discharge Time: 02/27/2008 19:18   All Providers: Dr. Linwood Dibbles, MD PSA; Ms. Narda Bonds - PA   --------------------------------------------------------------------------------------         PROVIDER: Dr. Linwood Dibbles, MD [facility]        HPI:       The patient is a 35 year old female who presents with a chief complaint of nausea       and vomiting. The history was provided by the patient. Pt only  vomited  once  on       saturday. She  called  her doctor and was told to come to the ED The nausea and       vomiting started 3 day(s) ago. The onset was gradual. The Course  is  worsening.       It is  characterized  as clear emesis. The symptoms are described as moderate to       severe. The condition is aggravated by eating. The condition is relieved by noth-       ing.  The symptoms have been associated with nausea,  while the symptoms have not       been associated with abdominal pain, diarrhea, dyspnea or fever.     14:11 02/27/2008 by Linwood Dibbles, MD [facility], Dr.           Linus Orn:       Statement: all systems negative except as marked or noted in the HPI       ENMT: Positive for sore throat.       Neuro: Positive for headache.     14:11 02/27/2008 by Linwood Dibbles, MD [facility], Dr.           Earl Lagos:       Documentation: physician reviewed/amended       Historian: patient       Last normal period: 07/20/2007       Past medical history: diabetes       Family History: CAD, diabetes, hypertension       Surgical History: tubal ligation       Social History: non-drinker, no drug abuse, current smoker w/i last 12 mos.       TB Screen: no symptoms present       Contraception: nothing       Special Needs: none      +--------------------------------------------------------------------------------+       +     Allergies         +       +---------------------+----------------------------+-----------------------------+       +    Drug    +     Reaction        +   Allergy Note      +       +---------------------+----------------------------+-----------------------------+       +    NKDA    +          +        +       +---------------------+----------------------------+-----------------------------+     14:11 02/27/2008 by Linwood Dibbles,  MD PSA, Dr.           Physical examination:       Vital signs and O2 SAT: reviewed, normal       Constitutional: well developed, well nourished, in no acute distress, calm       Head and Face: normocephalic, atraumatic       Eyes: normal appearance, no scleral icterus       ENMT: mucous membranes dry NOTE - hoarseness          Neck: supple       Cardiovascular: regular rate and rhythm, no murmur, rub, or gallop       Respiratory: breath sounds equal bilaterally, no rales, rhonchi, wheezes, or rub       Abdomen: soft, nontender, no guarding, no rebound tenderness       Extremities: pulses normal, no tenderness, no edema       Neuro: motor intact in all extremities, sensation normal , Cranial Nerves  II-XII       intact, normal coordination, normal speech       Skin: color normal       Psychiatric: no abnormalities of mood or affect     14:11 02/27/2008 by Linwood Dibbles, MD PSA, Dr.           ED Course:       CDU Observation:         I have placed the patient in CDU Observation at: 12:41 PM         For continuous medical management of: hyperglycemia     12:41 02/27/2008 by Linwood Dibbles, MD PSA, Dr.        ED Course:       Comments: Pt likely with viral type illness and hyperglycemia.  No dka.  Awaiting       ua and strep test. Blood sugar is iproving with treatment.     14:31 02/27/2008 by Linwood Dibbles, MD PSA, Dr.           Reviewed result:       Result Type: Cleda Daub: [id number]       Step Type: LAB       Procedure Name: GLUCOSE, CAPILLARY        Procedure: GLUCOSE, CAPILLARY        Result:        GLUCOSE    441       mg/dL  [54-09]      H           14:11 02/27/2008 by Linwood Dibbles, MD PSA, Dr.        Reviewed result:       Result Type: Cleda Daub: [id number]       Step Type: LAB       Procedure Name: CBC WITH DIFF        Procedure: CBC WITH DIFF        Result:        WBC COUNT    6.1       K/uL  [4.0-10.5]        RBC COUNT    3.89       MIL/uL  [3.87-5.11]        HEMOGLOBIN    12.0       g/dL  [29.5-62.1]        HEMATOCRIT    36.3       %   [  36.0-46.0]        MCV    93.2       fL  [78.0-100.0]        MCHC    33.1       g/dL  [16.1-09.6]        RDW    13.2       %   [11.5-15.5]        PLATELET COUNT   276       K/uL  [150-400]        NEUTROPHIL    49       %   [43-77]        ABS GRANULOCYTE   3.0       K/uL  [1.7-7.7]        LYMPHOCYTE    42       %   [12-46]        ABS LYMPH    2.6       K/uL  [0.7-4.0]        MONOCYTE    4       %   [3-12]        ABS MONOCYTE   0.2       K/uL  [0.1-1.0]        EOSINOPHIL    4       %   [0-5]        ABS EOS    0.3       K/uL  [0.0-0.7]        BASOPHIL    1       %   [0-1]        ABS BASO    0.0       K/uL  [0.0-0.1]           14:11 02/27/2008 by Linwood Dibbles, MD PSA, Dr.        Reviewed result:       Result Type: Cleda Daub: [id number]       Step Type: XRAY       Procedure Name: DG CHEST 2 VIEW        Procedure: DG CHEST 2 VIEW        Result:              Clinical Data: Nausea, chest pain.           CHEST - 2 VIEW           Comparison: 01/08/2005           Findings: Heart and mediastinal contours are within normal limits.        No focal opacities or effusions.  No acute bony abnormality.           IMPRESSION:        No active disease.           REF:A1 DICTATED: 02/27/2008 12:30:08           14:11 02/27/2008 by Linwood Dibbles, MD PSA, Dr.         Reviewed result:       Result Type: Cleda Daub: [id number]       Step Type: LAB       Procedure Name: BASIC METABOLIC PANEL        Procedure: BASIC METABOLIC PANEL        Procedure Notes: GFR, Est Afr Am - The eGFR has been calculated using  the  MDRD       equation.  This  calculation  has  not been validated in all clinical situations.       eGFR's persistently <60 mL/min signify possible Chronic Kidney Disease.        Result:        SODIUM    128       mEq/L  [135-145]      L        POTASSIUM    3.9       mEq/L  [3.5-5.1]        CHLORIDE    99       mEq/L  [96-112]        CARBON DIOXIDE   22       mEq/L  [19-32]        GLUCOSE    388       mg/dL  [16-10]      H        BUN    9       mg/dL  [9-60]        CREATININE    0.63       mg/dL  [4.5-4.0]        CALCIUM    8.0       mg/dL  [9.8-11.9]      L        GFR, Est Non Af Am   >60       mL/min  [>60]        GFR, Est Afr Am   >60       mL/min  [>60]           14:11 02/27/2008 by Linwood Dibbles, MD [facility], Dr.           Attending:       Supervision of:         Midlevel: I have personally performed and participated in all the services  and         procedures documented herein. I have rev iewed the findings with the patient.     14:31 02/27/2008 by Linwood Dibbles, MD [facility], Dr.           Libby Maw orders:       Verify orders: verify all orders     16:06 02/27/2008 by Linwood Dibbles, MD PSA, Dr.           Milinda Pointer electronically signed by ER Physician     16:06 02/27/2008 by Linwood Dibbles, MD PSA, Dr.        Milinda Pointer electronically signed by ER Physician     16:07 02/27/2008 by Linwood Dibbles, MD PSA, Dr.        Milinda Pointer electronically signed by ER Physician     08:03 02/28/2008 by Linwood Dibbles, MD [facility], Dr.            PROVIDER: Ms. Narda Bonds - PA        Home Medications:       Documentation: nurse pulled-forward and verified/updated          +--------------------------------------------------------------------------------+       +            Medications         +       +--------------------+------------------+------------------------+---------------+       +  Medication  +  [Medication]    +     Frequency      +  Last Dose   +       +    +  Dosage     +        +       +       +--------------------+------------------+------------------------+---------------+       +  NovoLIN N SubQ  +  37 units     + every morning      +       +       +--------------------+------------------+------------------------+---------------+       +  NovoLIN N SubQ  +  20 units     + at bed time      +       +       +--------------------+------------------+------------------------+---------------+       +  HumuLIN R Inj  +  11 units     + three times a day    +       +       +--------------------+------------------+------------------------+---------------+     19:07 02/27/2008 by Narda Bonds - PA, Ms.           ED Course:       Comments:  Received  to CDU bed 5. Presented with complaints of vomiting. Evalua-       tion revealed dehydration, hyponatremia, and hyperglycemia (known diabetic). Cur-       rently  receiving  IV hydration and is on glucostabilizer. UA neg. for infection.       Strep screen pending. Vital signs stable. Tolerating oral meds and fluids.  Will       continue to monitor.     15:56 02/27/2008 by Narda Bonds - PA, Ms.        ED Course:       Comments: Results for strep scree were negative (via both verbal and faxed report       from lab).     17:14 02/27/2008 by Narda Bonds - PA, Ms.        ED Course:       Comments: Tolerating oral fluids, repeat BMET improved. vital signs  stable.  D/C       home.     19:06 02/27/2008 by Narda Bonds - PA, Ms.           Patient disposition:       Patient disposition: Disch - Home       Primary Diagnosis: hyperglycemia       Additional diagnoses: dehydration, hypokalemia, hyponatremia, laryngitis            Counseling:  advised  of diagnosis, advised of treatment plan, advised of xray          and lab findings, advised of need for  close  follow-up, advised  of  need  to         return  for  worsening  or changing symptoms, advised of specific symptoms that         should prompt their return, patient voices understanding     19:06 02/27/2008 by Narda Bonds - PA, Ms.           Documentation completed by Responsible Physician     19:08 02/27/2008 by Narda Bonds - PA, Ms.           Medication disposition:          +--------------------------------------------------------------------------------+       +           Medications         +       +-----------+--------------+-------------+-----------+-------------+-------------+       +  Medication + [Medication] +  Frequency  + Last Dose + Medication  + PCP contact +       +  + Dosage       +      +   + disposition +      +       +-----------+--------------+-------------+-----------+-------------+-------------+       +NovoLIN N + 37 units     + every morn- +   + continue    +      +       +SubQ +        + ing      +   +        +      +       +-----------+--------------+-------------+-----------+-------------+-------------+       +NovoLIN N + 20 units     + at bed time +   + continue    +      +       +SubQ +        +      +   +        +      +       +-----------+--------------+-------------+-----------+-------------+-------------+       +HumuLIN R + 11 units     + three times +   + continue    +      +       +Inj +        + a day      +   +        +      +       +-----------+--------------+-------------+-----------+-------------+-------------+     19:07 02/27/2008 by Narda Bonds - PA, Ms.           Discharge:       Discharge Instructions:         dehydration - iv fluids, hyperglycemia, hypokalemia - with potassium supplemen-         tation, hyponatremia, laryngitis - no antibiotics, viral             +--------------------------------------------------------------------------------+       +      Referral/Appointment         +        +----------------------+--------------------+-------------------+----------------+       +  Refer Patient To:   +   Phone Number: +   Follow-up in    +  Appointment   +       +      +   +      +  Details:      +       +----------------------+--------------------+-------------------+----------------+       +  [facility]-    +   [phone]  +      +       +       +  Family Practice    +   +      +       +       +----------------------+--------------------+-------------------+----------------+     19:08 02/27/2008 by Narda Bonds - PA, Ms.

## 2010-02-18 NOTE — Assessment & Plan Note (Signed)
Summary: fu/diabetes/el   Vital Signs:  Patient Profile:   35 Years Old Female Weight:      170 pounds Pulse rate:   83 / minute BP sitting:   108 / 69  Pt. in pain?   no  Vitals Entered By: Arlyss Repress CMA, (August 02, 2006 9:13 AM)              Is Patient Diabetic? Yes    Chief Complaint:  f/up DM/refill meds.  History of Present Illness:  Type 1 Diabetes Mellitus Follow-Up      This is a 35 year old woman who presents for Type 1 diabetes mellitus follow-up.  The patient has not been seen since 2/07 and has a hx of multiple admissions for DKA.  Currently denies polyuria, polydipsia, blurred vision, self managed hypoglycemia, weight loss, weight gain, and numbness of extremities.  The patient denies the following symptoms: neuropathic pain, chest pain, vomiting, and foot ulcer.  Since the last visit the patient reports compliance with medications, not exercising regularly, and not monitoring blood glucose b/c she ran out of strips.  Since the last visit, the patient reports having had eye care by an optometrist and no foot care.  Pt admits to coming in today only b/c she was told that she would not get med refills w/out appt.  Last A1C 7.2    Past Medical History:    Reviewed history from 03/18/2006 and no changes required:       multiple hosp with DKA       NSVD 1993, 7/03, 8/04  Past Surgical History:    Reviewed history from 03/18/2006 and no changes required:       BTL - 08/20/2002   Family History:    Reviewed history from 03/18/2006 and no changes required:       Grandmother w/DM--unsure of rest b/c was foster child., Mother - drug addiction  Social History:    Reviewed history from 03/18/2006 and no changes required:       Had dgtr Theme park manager) at age 33, also 2 other young daughters Nickie [place], [place]).  Lives alone but has relationship with FOB of younger daughters.  Quit tobacco with last pregnancy; now smoking again.  Infreq EtOH.  No drugs.  Quit school in 11th  grade, obtained GED.  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   Risk Factors:  Tobacco use:  current    Cigarettes:  Yes -- 1/5 pack(s) per day   Review of Systems      See HPI   Physical Exam  General:     Well-developed,well-nourished,in no acute distress; alert,appropriate and cooperative throughout examination Eyes:     PERRL, EOMI Mouth:     Oral mucosa and oropharynx without lesions or exudates.  Neck:     No deformities, masses, or tenderness noted. Lungs:     Normal respiratory effort, chest expands symmetrically. Lungs are clear to auscultation, no crackles or wheezes. Heart:     Normal rate and regular rhythm. S1 and S2 normal without gallop, murmur, click, rub or other extra sounds. Abdomen:     Bowel sounds positive,abdomen soft and non-tender without masses, organomegaly or hernias noted. Pulses:     +2 DP and radial pulses Extremities:     No clubbing, cyanosis, edema.  No lesions.  Diabetes Management Exam:    Foot Exam (with socks and/or shoes not present):       Sensory-Pinprick/Light touch:          Left medial  foot (L-4): normal          Left dorsal foot (L-5): normal          Left lateral foot (S-1): normal          Right medial foot (L-4): normal          Right dorsal foot (L-5): normal          Right lateral foot (S-1): normal       Sensory-Monofilament:          Left foot: diminished          Right foot: diminished       Sensory-other: Diminished sensation to monofilament exam on heels bilaterally.  Otherwise WNL.       Inspection:          Left foot: normal          Right foot: normal       Nails:          Left foot: normal          Right foot: normal    Eye Exam:       Eye Exam done elsewhere          Date: 06/04/2006          Results: normal          Done by: [facility]    Impression & Recommendations:  Problem # 1:  DIABETES MELLITUS, I (ICD-250.01) Assessment: Unchanged Pt dx'd w/ DM Type I in 2000.  Has been noncompliant w/  follow-up as she hasn't been seen since 2/07.  Pt states she's been compliant w/ medication regimen- Humulin 12 units TIDAC and NPH 38 units QAM adn 20 units QPM.  Pt has not been testing CBGs as she has been out of strips.  A1C increased from 7.2--> 7.5.  Will not make changes in insulin regimen at this time as I am uncertain as to what pt's fasting CBGs vs postprandial values.  Pt to continue current insulin regimen and will begin testing sugars and bring values to next appt so we can adjust her regimen.  Pt in agreement w/ this plan.  *Following pt's departure reviewed 2+ microalbuminuria, pt will need low dose ACE for renal protection pending the result of her BMP. Orders: A1C-[facility] (16109) UA Microalbumin-[facility] (60454) Basic Met-[facility] (09811-91478) [facility]- Est Level  3 (29562)   Problem # 2:  Preventive Health Care (ICD-V70.0) Assessment: Comment Only Pt has not had a pap documented in chart since 2003.  Advised pt she is overdue for complete physical exam.  Will schedule pt to return for exam in 6-8 weeks at which time I will also adjust insulin as needed.   Patient Instructions: 1)  Please schedule a follow-up appointment in 6-8 weeks for complete physical. 2)  Check blood sugars every morning (fasting) and before each meal for a total of 4 times/day 3)  Record values in a notebook and bring to your next visit 4)  Please bring your meter to the next visit 5)  Use insulin EVERY day!  Call the office if sugars are >250 or <65. 6)  We will adjust your insulin doses based on your numbers at the next appointment. 7)  HAPPY BIRTHDAY!       Laboratory Results   Urine Tests  Date/Time Recieved: August 02, 2006 9.16  AM  Date/Time Reported: August 02, 2006 10:39 AM  Microalbumin (urine): 2+ mg/L    Comments: ...............test performed by......Marland KitchenBonnie A. [place],  [place] (ASCP)   Blood Tests   Date/Time Recieved: August 02, 2006 9:16 AM  Date/Time Reported: August 02, 2006 9:28 AM   HGBA1C:  7.5%   (Normal Range: Non-Diabetic - 3-6%   Control Diabetic - 6-8%)  Comments: ...............test performed by......Marland KitchenBonnie A. [place], [place] (ASCP)

## 2010-02-18 NOTE — Miscellaneous (Signed)
Summary: insulin refill  Clinical Lists Changes   Prescriptions: NOVOLIN N 100 UNIT/ML  SUSP (INSULIN ISOPHANE HUMAN) Inject 38 units QAM and 20 units QPM.  Dispense 1 vial.  #1 x 3   Entered and Authorized by:   Neena Rhymes  MD   Signed by:   Neena Rhymes  MD on 08/16/2006   Method used:   Electronically sent to ...       [facility] DrMarland Kitchen       [address]       [place], [place]  [address]       Ph: [phone]       Fax: [phone]   RxID:   [id number] NOVOLIN R 100 UNIT/ML INJ SOLN (INSULIN REGULAR HUMAN) inject 12 units three times a day with meals.  Dispense 1 vial  #1 x 3   Entered and Authorized by:   Neena Rhymes  MD   Signed by:   Neena Rhymes  MD on 08/16/2006   Method used:   Electronically sent to ...       [facility] Dr[facility]land Kitchen       [address]       [place], [place]  [address]       Ph: [phone]       Fax: [phone]   RxID:   [id number]

## 2010-02-18 NOTE — Progress Notes (Signed)
Summary: Rx Prob   Phone Note Call from Patient Call back at Home Phone [phone]   Caller: Patient Summary of Call: needs rxs for her insulin the pen the needles that go to it and the strips.  She talked to the pharmacy and they said it would be best if she got a whole new meter because medicaid was not paying for the One Touch Ultra. Initial call taken by: Clydell Hakim,  Jun 04, 2008 9:22 AM  Follow-up for Phone Call        will sned message to MD. Follow-up by: Theresia Lo RN,  Jun 04, 2008 9:30 AM  Additional Follow-up for Phone Call Additional follow up Details #1::        wrote script for lantus needles, prodigy meter and supplies.  also wrote script for humulin R pens (although they don't have this at [facility]).  pt will call me and tell me if she finds pens for fast acting insulin.  called pt and told her scripts ready to pick up Additional Follow-up by: Asher Muir MD,  Jun 04, 2008 12:15 PM

## 2010-02-18 NOTE — Assessment & Plan Note (Signed)
Summary: DNKA   DPG   

## 2010-02-18 NOTE — Assessment & Plan Note (Signed)
Summary: f/u,df   Vital Signs:  Patient profile:   35 year old female Height:      64 inches Weight:      171.6 pounds Temp:     98.6 degrees F Pulse rate:   89 / minute BP sitting:   110 / 78  (right arm)  Vitals Entered By: Renato Battles slade,cma CC: f/up ER visit Is Patient Diabetic? Yes  Pain Assessment Patient in pain? yes     Location: lower back Intensity: 7 Onset of pain  x 1 week   History of Present Illness: Have met this pt when she has brought her children in to see, but today is the first time I am seeing her as a pt.  Discussed:  1.  UTI--seen in ER one week ago.  diagnosed with UTI.  per notes +nitrate and large LE, but no cx was done.  Given 10 day course of cipro.  pt has been taking cipro, but still feels general malaise.  fevers up to 102, most recently yesterday morning.  sleeping alot.  still having nausea (no vomitting), bilateral lower abdominal pain.  breasts tender.  reporst that her sugars are actually lower than normal, ie, 75 this am, 138 this afternoon.  she usually runs in the 200s.  She denies any dysuria, urgency.  does endorse frequency.  she is also constipated the past few weeks.  Has had BTL and neg upreg in ER, but says that she feels like she did when she was pregnant.  LMP 04/21/08.    2.  DM--has type I DM.  takes novolin-N 40 in am and 22 in pm.  takes novolin R 12 units three times a day with meals.  last A1C8.7 on 02/28/08.  Would like to switch to the novolog flex pen because she is having trouble with air bubbles in the needle  contact [phone]   Allergies: No Known Drug Allergies  Past History:  Past Medical History:    Reviewed history from 08/02/2006 and no changes required:    multiple hosp with DKA    NSVD 1993, 7/03, 8/04  Past Surgical History:    Reviewed history from 03/18/2006 and no changes required:    BTL - 08/20/2002  Social History:    Had dgtr Theme park manager) at age 53, also 2 other young daughters Nickie Retort, [place]).  Lives  alone but has relationship with FOB of younger daughters.  Quit tobacco with last pregnancy; now smoking again.  Infreq EtOH.  No drugs.  Quit school in 11th grade, obtained GED.  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  currently in school and working.  lots of psychosocial stressors right now.  Physical Exam  General:  Laying on bed, appears uncomfortable Head:  Normocephalic and atraumatic without obvious abnormalities. No apparent alopecia or balding. Mouth:  mmm Abdomen:  mild ttp in bilateral lower quadrants.  soft, no distention, no masses, no guarding, no rebound tenderness, and no abdominal hernia.  NABS. Genitalia:  Pelvic Exam:        External: normal female genitalia without lesions or masses        Vagina: normal without lesions or masses        Cervix: normal without lesions or masses; discomfort with motion.          Adnexa: no masses or fullness, but discomfort with palpation        Uterus: no discomfort or fullness, but discomfort with palpation        Pap smear: performed; also  G/C, chlam, wet prep Extremities:  no LE edema 2+dp pulses no foot lesions Additional Exam:  vital signs reviewed    Impression & Recommendations:  Problem # 1:  NAUSEA (ICD-787.02) Assessment New Really, this is a constellation of sxs including malaise, fever, urinary frequency, breast tenderness, lower abdominal pain.  She tells me that her cbgs have been lower, but her glucose is 353 today.  no ketonuria; so I don't think this is DKA.  Her urine is normal except for glycosuria.  her exam is a little concerning (though not completely convincing) for PID.  Will check labs as below.  will go ahead and treat empirically for G/C chlam with doxy and metronidazole.  needs close f/u for her DM.  see below.  If she is still having this pain, may need to consider transvaginal u/s to look for ovarian cysts or other gyn causes.  Orders: Urinalysis-[facility] (00000) Comp Met-[facility] [phone]) CBC w/Diff-[facility] [phone])  Glucose Cap-[facility] (78469) Wet Prep- [facility] (62952) GC/Chlamydia-[facility] (87591/87491) [facility]- Est  Level 4 ([address])  Problem # 2:  DIABETES MELLITUS, I (ICD-250.01) Assessment: Deteriorated clearly this is not well-controlled.  will increase her meal covg with some SSI.  don't want to increase basal at this point, because she is having some low cbgs.  given the amount of insulin she is requiring, there is probably an insulin resistant component to this as well.  Will do SSI as written in pt instructions.  RTC soon for f/u Her updated medication list for this problem includes:    Novolin R 100 Unit/ml Inj Soln (Insulin regular human) .Marland Kitchen... At mealtime, use sliding scale as instructed    Novolin N 100 Unit/ml Susp (Insulin isophane human) ..... Inject 45 units qam and 25 units qpm.  dispense 1 vial.  Orders: A1C-[facility] (44010) [facility]- Est  Level 4 (27253)  Problem # 3:  update since visit Assessment: Comment Only reviewed labs.  plts high (400s) and slightly anemic.  probably need to recheck plts at some point and do iron studies for the anemia.  called pt to give results.  she still feels ill.  Tmax 99 since visit.  also gave her script for ibuprofen for pain.  also discussed her request to change to pens.  pt says that she just received medicaid.  was on lantus in the past and that worked well for her.  plan on seeing her for DM follow up and will plan to change her back to lantus at next visit.  Complete Medication List: 1)  Novolin R 100 Unit/ml Inj Soln (Insulin regular human) .... At mealtime, use sliding scale as instructed 2)  Novolin N 100 Unit/ml Susp (Insulin isophane human) .... Inject 45 units qam and 25 units qpm.  dispense 1 vial. 3)  Fluticasone Propionate 50 Mcg/act Susp (Fluticasone propionate) .... 2 sprays per nostril daily 4)  Doxycycline Hyclate 100 Mg Caps (Doxycycline hyclate) .Marland Kitchen.. 1 tab by mouth two times a day for 14 days 5)  Metronidazole 500 Mg Tabs (Metronidazole) .Marland Kitchen.. 1 tab by  mouth two times a day for 14 days 6)  Ibuprofen 600 Mg Tabs (Ibuprofen) .... Take 1 tab by mouth three times a day as needed pain; take with food  Other Orders: U Preg-[facility] (66440) Pap Smear-[facility] ([id number]) Urine Culture-[facility] ([id number])  Patient Instructions: 1)  It was nice to see you today.  2)  If you are not feeling considerably better by Friday, call early Friday morning for a same-day appointment.   3)  I will call you with your lab results. 4)  Finish your antibiotics. 5)  In the meantime, drink lots of water. 6)  At mealtime, if your sugar is 200-250, use 15 units.  if your sugar is 251-300, use 18 units.  if your sugar is 301-350, use 20 units.  if your sugar is >351, use 22 units (novolin R). 7)  Start taking the antibiotics I prescribed you.   8)  Please schedule a follow-up appointment within 1 month for DM and a complete physical.  Prescriptions: IBUPROFEN 600 MG TABS (IBUPROFEN) take 1 tab by mouth three times a day as needed pain; take with food  #90 x 0   Entered and Authorized by:   Asher Muir MD   Signed by:   Asher Muir MD on 05/16/2008   Method used:   Electronically to        [facility] (retail)       [address]       [place], [place]  [address]       Ph: [phone]       Fax: [phone]   RxID:   [id number] NOVOLIN R 100 UNIT/ML INJ SOLN (INSULIN REGULAR HUMAN) At mealtime, use sliding scale as instructed  #2 vials x 0   Entered and Authorized by:   Asher Muir MD   Signed by:   Asher Muir MD on 05/16/2008   Method used:   Electronically to        [facility] (retail)       [address]       [place], [place]  [address]       Ph: [phone]       Fax: [phone]   RxID:   [id number] METRONIDAZOLE 500 MG TABS (METRONIDAZOLE) 1 tab by mouth two times a day for 14 days  #28 x 0   Entered and Authorized by:   Asher Muir MD   Signed by:   Asher Muir MD on 05/15/2008   Method used:   Electronically to        [facility] (retail)       [address]       [place], [place]  [address]       Ph: [phone]       Fax: [phone]   RxID:   [id number] DOXYCYCLINE HYCLATE 100 MG CAPS (DOXYCYCLINE HYCLATE) 1 tab by mouth two times a day for 14 days  #28 x 0   Entered and Authorized by:   Asher Muir MD   Signed by:   Asher Muir MD on 05/15/2008   Method used:   Electronically to        [facility] (retail)       [address]       [place], [place]  [address]       Ph: [phone]       Fax: [phone]   RxID:   [id number]    Flowsheet View for Follow-up Visit    Weight:     171.6    Blood pressure:   110 / 78    Urine protein:       negative    Urine glucose:    500    Urine  nitrite:     negative    OB Initial Intake Information    Race: Black    Marital status: Single   Flowsheet View for Follow-up Visit    Weight:     171.6    Blood pressure:   110 / 78    Urine Protein:     negative    Urine Glucose:   500    Urine Nitrite:     negative     Laboratory Results   Urine Tests  Date/Time Received: May 15, 2008 3:03 PM  Date/Time Reported: May 15, 2008 3:15 PM   Routine Urinalysis   Color: yellow Appearance: Clear Glucose: 500   (Normal Range: Negative) Bilirubin: negative   (Normal Range: Negative) Ketone: negative   (Normal Range: Negative) Spec. Gravity: >=1.030   (Normal Range: 1.003-1.035) Blood: negative   (Normal Range: Negative) pH: 5.5   (Normal Range: 5.0-8.0) Protein: negative   (Normal Range: Negative) Urobilinogen: 0.2   (Normal Range: 0-1) Nitrite: negative   (Normal Range: Negative) Leukocyte Esterace: negative   (Normal Range: Negative)    Urine HCG: negative Comments: ...............test performed by......Marland KitchenBonnie A. [place], [place] (ASCP)   Blood Tests   Date/Time Received: May 15, 2008 3:48 PM  Date/Time Reported: May 15, 2008 5:15 PM  Time patient last ate: 12:30  HGBA1C: 9.0%   (Normal Range: Non-Diabetic - 3-6%   Control Diabetic - 6-8%)  Comments: ...............test performed by......Marland KitchenBonnie A. [place], [place] (ASCP)  Date/Time Received: May 15, 2008 3:48 PM  Date/Time Reported: May 15, 2008 5:16 PM   [employer] Source: vag WBC/hpf: 1-5 Bacteria/hpf: 2+ cocci with 1+ rods Clue cells/hpf: moderate  Positive whiff Yeast/hpf: none Trichomonas/hpf: none Comments: ...............test performed by......Marland KitchenBonnie A. [place], [place] (ASCP)

## 2010-02-18 NOTE — Assessment & Plan Note (Signed)
Summary: tb test/tabori/el  Nurse Visit      PPD Application    Vaccine Type: PPD    Site: left forearm    Mfr: Sanofi Pasteur    Dose: 0.1 ml    Route: ID    Given by: AMY MARTIN RN    Exp. Date: 03/18/2008    Lot #: [id number]   Orders Added: 1)  TB Skin Test [86580] 2)  Admin 1st Vaccine [phone]

## 2010-02-18 NOTE — Letter (Signed)
Summary: Handout Printed  Printed Handout:  - Family Medicine Patient Instructions 

## 2010-02-18 NOTE — Letter (Signed)
Summary: Out of [facility]  [address]   [place], [place] [address]   Phone: [phone]  Fax: [phone]    November 08, 2007   Student:  Max Sane    To Whom It May Concern:   For Medical reasons, please excuse the above named student from school for the following dates:  Start:   November 08, 2007  End:    November 10, 2007 4:49 PM   If you need additional information, please feel free to contact our office.   Sincerely,    Timon Geissinger CMA    ****This is a legal document and cannot be tampered with.  Schools are authorized to verify all information and to do so accordingly.

## 2010-02-18 NOTE — Letter (Signed)
Summary: Generic Letter  [facility]  [address]   [place], [place] [address]   Phone: [phone]  Fax: [phone]    02/29/2008  STORIE HEFFERN [address] [place], [place]  [address]  Dear Ms. Dowson,  Your labs from 02/28/08 were all normal except for your glucose being elevated at 213. Your sodium was low in the hospital but is now normal. If you have any questions, please call our office.   Sincerely,   Cyndia Bent MD [facility]  Appended Document: Generic Letter mailed

## 2010-02-18 NOTE — Letter (Signed)
Summary: Generic Letter  [facility]  [address]   [place], [place] [address]   Phone: [phone]  Fax: [phone]    11/20/2008  Barbara Clark [address] [place], [place]  [address]  Dear Ms. Nogales,   You have not been seen in our office for your diabetes since May.  Please schedule a follow up visit by calling our front office to discuss your diabetes.  I look forward to seeing you.        Sincerely,   Asher Muir MD  Appended Document: Generic Letter mailed

## 2010-02-18 NOTE — Progress Notes (Signed)
Summary: Triage   Phone Note Call from Patient Call back at Home Phone [phone]   Reason for Call: Talk to Nurse Summary of Call: pt is calling c/o feeling bad since last week, thinks it is due to her insulin.  wants to be seen today, but would also like to speak with rn. Initial call taken by: Haydee Salter,  February 27, 2008 10:24 AM  Follow-up for Phone Call        left message Follow-up by: Golden Circle RN,  February 27, 2008 10:27 AM  Additional Follow-up for Phone Call Additional follow up Details #1::        returning call Additional Follow-up by: Haydee Salter,  February 27, 2008 10:33 AM    Additional Follow-up for Phone Call Additional follow up Details #2::    am cbg was over 500. has been sick & having high cbgs since last week. states last time she was like this she was admitted to hospital. sent to ED now. states she has someone to drive her & will go now. voice was high pitched and she was barely able to talk. denies breathing problems. Follow-up by: Golden Circle RN,  February 27, 2008 10:54 AM

## 2010-02-18 NOTE — Assessment & Plan Note (Signed)
Summary: not feeling any better-see Tues notes   Vital Signs:  Patient profile:   35 year old female Weight:      174.2 pounds Temp:     98.2 degrees F oral Pulse rate:   75 / minute BP sitting:   111 / 76  (left arm)  Vitals Entered By: Arlyss Repress CMA, (May 18, 2008 10:11 AM) CC: swollen feet yesterday. stomach pain. Pain Assessment Patient in pain? yes     Location: stomach Intensity: 6   History of Present Illness: 35yo with DM1.  Here b/c not feeling well for 10 days now.  Initially went to ED and given cipro for UTI (no cx obtained).  Also had negative UPreg and CBC at that visit.  Came to our clinic 4/28 b/c not any better.  Evaluated with CMET, CBC, GC/CT, and urine culture.  All studies essentially negative.  Pt treated empirically for PID with doxycycline and flagyl.  Still not any better despite taking cipro, doxycycline,a nd flagyl now.  Breast swelling and tenderness- feels like she is pregnant.  Just had period 4/3 and is s/p BTL and had negative UPreg 4/20.  Also with lower abdominal pain and bloating.  Has only had 1 BM in 2 weeks which is unusual for her.  No blood in stool, no obstipation.  Has not filled miralax prescription given from Dr. Lafonda Mosses on 4/28.  No fevers, no vag d/c, normal appetite.  Still wonders if she could be pregnant or have a tubal pregnancy.  DM1- sugars running "normal" which is unusal for her since they are usually 200-300.  She has been eating normally so she is not sure why her sugars are suddenly "normal."  Allergies: No Known Drug Allergies  Past History:  Past Medical History:    Reviewed history from 08/02/2006 and no changes required:    multiple hosp with DKA    NSVD 1993, 7/03, 8/04  Past Surgical History:    Reviewed history from 03/18/2006 and no changes required:    BTL - 08/20/2002  Physical Exam  General:  alert, laying back on exam table, appears uncomfortable (out of proportion). Head:  normocephalic and  atraumatic.   Mouth:  moist mucous membranes Abdomen:  +BS, soft, ttp in lower quadrants, no rebound, no guarding, no masses Rectal:  no external abnormalities, no hemorrhoids, normal sphincter tone, no masses, no tenderness, and no fissures.  No fecal impaction. Extremities:  no swelling. Neurologic:  alert & oriented X3.     Impression & Recommendations:  Problem # 1:  ABDOMINAL PAIN, LOWER (ICD-789.09) prior w/u has included CBC, CMET, U/A, urine culture, GC, CT, and Upreg. not better now with cipro, doxycycline, and flagyl. repeat upreg today is negative. I think her lower abdominal pain is coming from constipation.  will start miralax 1 cap daily until bowels are more regular.  If no better on return visit, consider pelvic US and checking HIV since her symptoms are so vague.    Her updated medication list for this problem includes:    Ibuprofen 600 Mg Tabs (Ibuprofen) .Marland Kitchen... Take 1 tab by mouth three times a day as needed pain; take with food  Orders: U Preg-[facility] (81025) [facility]- Est  Level 4 ([address])  Complete Medication List: 1)  Novolin R 100 Unit/ml Inj Soln (Insulin regular human) .... At mealtime, use sliding scale as instructed 2)  Novolin N 100 Unit/ml Susp (Insulin isophane human) .... Inject 45 units qam and 25 units qpm.  dispense 1  vial. 3)  Fluticasone Propionate 50 Mcg/act Susp (Fluticasone propionate) .... 2 sprays per nostril daily 4)  Doxycycline Hyclate 100 Mg Caps (Doxycycline hyclate) .Marland Kitchen.. 1 tab by mouth two times a day for 14 days 5)  Metronidazole 500 Mg Tabs (Metronidazole) .Marland Kitchen.. 1 tab by mouth two times a day for 14 days 6)  Ibuprofen 600 Mg Tabs (Ibuprofen) .... Take 1 tab by mouth three times a day as needed pain; take with food 7)  Miralax Powd (Polyethylene glycol 3350) .Marland Kitchen.. 17gm mixed in water or juice once daily until bowels are regular.  give 1 large jar.  Patient Instructions: 1)  We will check one more pregnancy test just to be sure- yours on april  20th was negative. 2)  If the pregnancy test is negative, I think you could be having stomach pain from constipation.  Miralax laxative will be sent to your pharmacy.  Take 1 scoop daily until your bowel movements become regular. 3)  Follow up with Dr. Lafonda Mosses in 1 week if you are not better- or sooner for any severe abdominal pain, stomach swelling, fevers, or vomiting. Prescriptions: MIRALAX  POWD (POLYETHYLENE GLYCOL 3350) 17gm mixed in water or juice once daily until bowels are regular.  Give 1 large jar.  #1 x 0   Entered and Authorized by:   Sylvan Cheese MD   Signed by:   Sylvan Cheese MD on 05/18/2008   Method used:   Electronically to        [facility] (retail)       [address]       [place], [place]  [address]       Ph: [phone]       Fax: [phone]   RxID:   [id number]   Laboratory Results   Urine Tests  Date/Time Received: May 18, 2008 10:51 AM  Date/Time Reported: May 18, 2008 10:54 AM     Urine HCG: negative Comments: ...........test performed by...........Marland KitchenTerese Door, CMA

## 2010-02-18 NOTE — Progress Notes (Signed)
Summary: Phone call - Needs appt  Phone Note Outgoing Call   Call placed by: AMY MARTIN RN,  Jun 02, 2006 10:24 AM Call placed to: Patient Summary of Call: Phone call to pt - left her a message to call me back so that I can set her up with an appt to have a check up with for her DM ASAP. Initial call taken by: AMY MARTIN RN,  Jun 02, 2006 10:31 AM  Follow-up for Phone Call        Phone call to pt - left message to return my call about scheduling an appt to f/u her DM.   Follow-up by: AMY MARTIN RN,  Jun 04, 2006 4:06 PM  Additional Follow-up for Phone Call Additional follow up Details #1::        Phone call to pt- left message to return my call.  Will send letter regarding this because I have been unable to reach pt by phone.  Additional Follow-up by: AMY MARTIN RN,  Jun 10, 2006 9:59 AM

## 2010-02-18 NOTE — Consult Note (Signed)
[facility]: [facility]   Imported By: Knox Royalty 11/02/2007 14:56:42  _____________________________________________________________________  External Attachment:    Type:   Image     Comment:   External Document  Appended Document: [facility]     Clinical Lists Changes  Observations: Added new observation of DBT EY CK DT: 11/05/2007 (11/05/2007 21:21) Added new observation of DMEYEEXAMNXT: 11/04/2008 (11/05/2007 21:21) Added new observation of DIAB EYE EX: normal (11/05/2007 21:21) Added new observation of PAP DUE: 01/20/2003 (11/05/2007 21:21) Added new observation of DB FT EX DUE: 08/02/2007 (11/05/2007 21:21) Added new observation of HGBA1CNXTDUE: 07/20/2007 (11/05/2007 21:21) Added new observation of MICRALB DUE: 08/02/2007 (11/05/2007 21:21) Added new observation of CREATNXTDUE: 04/19/2008 (11/05/2007 21:21) Added new observation of POTASSIUMDUE: 04/19/2008 (11/05/2007 21:21)      Diabetic Eye Exam Date:  11/05/2007 Diabetes Eye Exam Result:  normal Diabetes Eye Exam Due:  1 yr

## 2010-02-18 NOTE — Miscellaneous (Signed)
Summary: [name]  Clinical Lists Changes  Problems: Added new problem of [name] (ICD-305.1) 

## 2010-02-18 NOTE — Progress Notes (Signed)
Summary: triage   Phone Note Call from Patient Call back at Home Phone [phone]   Caller: Patient Summary of Call: still not feeling well and still in pain.  wants to come in today Initial call taken by: De Nurse,  May 18, 2008 9:03 AM  Follow-up for Phone Call        states she does not feel any better. wants to be sen this am. appt with Dr. [name] at 10am Follow-up by: Golden Circle RN,  May 18, 2008 9:09 AM

## 2010-02-18 NOTE — Letter (Signed)
Summary: Out of Work  [facility]  [address]   [place], [place] [address]   Phone: [phone]  Fax: [phone]    February 28, 2008   Employee:  ALICJA EVERITT    To Whom It May Concern:   For Medical reasons, please excuse the above named employee from work for the following dates:  Start:   02/28/08 to 03/01/08   If you need additional information, please feel free to contact our office.         Sincerely,    Cyndia Bent MD

## 2010-02-18 NOTE — Assessment & Plan Note (Signed)
Summary: ed yesterday- not any better-see notes   Vital Signs:  Patient Profile:   35 Years Old Female Height:     64 inches (162.56 cm) Weight:      165.8 pounds (75.36 kg) BMI:     28.56 Temp:     98.1 degrees F (36.72 degrees C) oral Pulse rate:   97 / minute BP sitting:   111 / 74  (left arm) Cuff size:   regular  Vitals Entered By: Garen Grams LPN (February 28, 2008 1:53 PM)                 PCP:  Asher Muir MD  Chief Complaint:  Ed f/u for Nausea and hyperglycemia.  History of Present Illness: 35 yo with type I DM here for ED FU for hyperglycemia and nausea.  Child was recently sick - the 2 youngest had colds. She started feeling sick on Friday and lost her voice. Still does not have her voice back yet. Was taking theraflu and tylenol. Vomited Saturday x 2. Has not vomited since then. Ate a little yesterday but appetite is decreased. Is drinking liquids well. Had cough on Friday but really not much today. Fever on Friday or Saturday but not checked with thermometer. Chest hurts from vomiting and only when you press on it. Sugars have been elevated. This weekend they were in the 600s at home and at the hospital they were in the 400s.  Left work on Friday because could not even work. Feels really tired. Was having runny nose but now nose is really stopped up.  Period started this AM and has BTL. 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 Also has multiple children at home that she has to care for. Feels a little bit better than yesterday. Drinking water.   Patient was also seen by Dr. Leveda Anna as she did not ellaborate much on what we could do to help her and what symptoms were bothering her the most. He was able to spend more time with her to discuss her busy daily schedule. She is getting minimal sleep and is unable to function with work, school, and children. She was advised to think about this and figure out something to decrease or cut  out of her schedule to allow her the time she needs to stay well.     Current Allergies: No known allergies       Physical Exam  General:     Laying on bed, Looks miserable, hoarse voice  Head:     normocephalic and atraumatic.   Ears:     TMs normal bilaterally  Mouth:     pharynx pink and moist, no erythema, and no exudates.   Neck:     supple and no masses.   Chest Wall:     Tender over upper chest wall on palpation Lungs:     normal respiratory effort, normal breath sounds, no crackles, and no wheezes.   Heart:     normal rate, regular rhythm, no murmur, no gallop, and no rub.   Abdomen:     soft and non-tender.   Extremities:     no edema  Neurologic:     Seems very sleepy but is oriented and will talk when asked questions but not very freely.  Psych:     Seems depressed. Tearful at times.     Impression & Recommendations:  Problem # 1:  URI (ICD-465.9) Assessment:  New Has this which has caused hoarseness as well as may also be developing a sinus infection. Sent in Rx for fluticasone nasal spray to see if this helps her symptoms. Orders: [facility]- Est  Level 4 (57322)   Problem # 2:  HYPONATREMIA (ICD-276.1) Assessment: New Likely due to high glucose yesterday in hospital. Will recheck today.   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 Advised her to take a few days off of work and consider her schedule and what she really can possibly do and still stay healthy.  Orders: [facility]- Est  Level 4 (15176)   Problem # 4:  DIABETES MELLITUS, I (ICD-250.01) Assessment: Improved AIC is 8.7 down from 9.5. Will increase insulin by 2 units in AM and 2 units in PM.  Her updated medication list for this problem includes:    Novolin R 100 Unit/ml  Inj Soln (Insulin regular human) ..... Inject 12 units three times a day with meals.  dispense 1 vial    Novolin N 100 Unit/ml Susp (Insulin isophane human) ..... Inject 40 units qam and 22 units qpm.  dispense 1 vial.  Orders: A1C-[facility] (16073) Glucose Cap-[facility] (71062) [facility]- Est  Level 4 (99214)   Complete Medication List: 1)  Novolin R 100 Unit/ml Inj Soln (Insulin regular human) .... Inject 12 units three times a day with meals.  dispense 1 vial 2)  Novolin N 100 Unit/ml Susp (Insulin isophane human) .... Inject 40 units qam and 22 units qpm.  dispense 1 vial. 3)  Fluticasone Propionate 50 Mcg/act Susp (Fluticasone propionate) .... 2 sprays per nostril daily   Patient Instructions: 1)  I think you either have a bad cold or you have a sinus infection. 2)  Try the fluticasone nasal spray daily to help with your infection. 3)  Also try nasal saline drops for your congestion. 4)  I am giving you a note for work.  5)  Try to drink lots of liquids.  6)  We will check some more blood work today. 7)  If you are still feeling poorly on Thursday come back to see [place].  8)  Take Tylenol as needed headache.  9)  Increase your two times a day insulin to 2 more units every morning and 22 units in the evening.  10)  Make an appointment with Dr. Lafonda Mosses (her next available) to follow up your diabetes. It would be best to come back in 1 week if you are able to do this. 11)  Try to relax and think about your schedule. You need regular meals and sleep to function.    Prescriptions: FLUTICASONE PROPIONATE 50 MCG/ACT SUSP (FLUTICASONE PROPIONATE) 2 sprays per nostril daily  #1 x 1   Entered and Authorized by:   Cyndia Bent MD   Signed by:   Cyndia Bent MD on 02/28/2008   Method used:   Electronically to        [facility] (retail)       [address]       [place], [place]  [address]       Ph: [phone]       Fax: [phone]   RxID:   [id number]    Laboratory Results   Blood Tests  Date/Time Received: February 28, 2008 1:56 PM  Date/Time Reported: February 28, 2008 2:13 PM   HGBA1C: 8.7%   (Normal Range: Non-Diabetic - 3-6%   Control Diabetic - 6-8%)  Comments: ..........test performed by.....Marland KitchenMarland KitchenDeseree Blount (SMA)                 confirmed by...Marland KitchenMarland KitchenMarland Kitchen Bonnie A. [place], [place] (ASCP)      Appended Document: ed yesterday- not any better-see notes Also in ED CBC was normal and electrolytes were fine except for Na. Given patients sugar today and her normal electrolytes on her CMET she is not in DKA.

## 2010-02-18 NOTE — Letter (Signed)
Summary: Out of Work  [facility]  [address]   [place], [place] [address]   Phone: [phone]  Fax: [phone]    November 08, 2007   Employee:  Barbara Clark    To Whom It May Concern:   For Medical reasons, please excuse the above named employee from work for the following dates:  Start:    End:    If you need additional information, please feel free to contact our office.         Sincerely,    Lillia Pauls CMA

## 2010-02-18 NOTE — Letter (Signed)
Summary: Generic Letter  [facility]  [address]   [place], [place] [address]   Phone: [phone]  Fax: [phone]    05/21/2008  Barbara Clark [address] [place], [place]  [address]  Dear Ms. Boyar,   I just wanted to let you know that your pap smear was normal.  Please call me if you have any questions or concerns.            Sincerely,   Asher Muir MD  Appended Document: Generic Letter mailed

## 2010-02-18 NOTE — Assessment & Plan Note (Signed)
Summary: f/u dm,df   Vital Signs:  Patient profile:   35 year old female Height:      64 inches Weight:      169.8 pounds Temp:     98.2 degrees F oral Pulse rate:   87 / minute BP sitting:   101 / 71  (left arm)  Vitals Entered By: Alphia Kava (May 31, 2008 1:33 PM) CC: f/u DM, abd pain, vag irritation Is Patient Diabetic? Yes   History of Present Illness: 1.  abd pain-- resolved.  never took miralax, but did give self enema.  feels much better.  back to work.  2.  dm--wants to change to lantus pen.  now has medicaid again.  did well on lantus before.  currently on 45 novolin am and 25 in pm.  also on SSI with meals.  sugars 48-348 past week  3.  vaginal irritation--self treated for yeast infection with antifungal cream, but still with some irritation.  also with dysuria and +frequency  Habits & Providers     Tobacco Status: current     Cigarette Packs/Day: 0.5  Allergies: No Known Drug Allergies  Physical Exam  General:  alert, laying back on exam table, appears uncomfortable (out of proportion). Lungs:  Normal respiratory effort, chest expands symmetrically. Lungs are clear to auscultation, no crackles or wheezes. Heart:  Normal rate and regular rhythm. S1 and S2 normal without gallop, murmur, click, rub or other extra sounds. Abdomen:  Bowel sounds positive,abdomen soft and non-tender without masses, organomegaly or hernias noted.  Diabetes Management Exam:    Foot Exam (with socks and/or shoes not present):       Sensory-Pinprick/Light touch:          Left medial foot (L-4): diminished          Left dorsal foot (L-5): diminished          Left lateral foot (S-1): normal          Right medial foot (L-4): diminished          Right dorsal foot (L-5): diminished          Right lateral foot (S-1): normal       Sensory-Monofilament:          Left foot: diminished          Right foot: diminished       Inspection:          Left foot: normal          Right foot: normal        Nails:          Left foot: normal          Right foot: normal   Impression & Recommendations:  Problem # 1:  ABDOMINAL PAIN, LOWER (ICD-789.09) Assessment Improved resolved.  told to take miralax as needed for constipation The following medications were removed from the medication list:    Ibuprofen 600 Mg Tabs (Ibuprofen) .Marland Kitchen... Take 1 tab by mouth three times a day as needed pain; take with food  Orders: [facility]- Est  Level 4 ([address])  Problem # 2:  VAGINAL DISCHARGE (ICD-623.5) Assessment: New wet prep consistent with BV.  prescribed flagyl Orders: Wet Prep- [facility] (60454) [facility]- Est  Level 4 (09811)  Problem # 3:  DYSURIA (ICD-788.1) Assessment: New u/a not convincing for infection.  will await cx results The following medications were removed from the medication list:    Metronidazole 500 Mg Tabs (Metronidazole) .Marland Kitchen... 1 tab  by mouth two times a day for 14 days Her updated medication list for this problem includes:    Doxycycline Hyclate 100 Mg Caps (Doxycycline hyclate) .Marland Kitchen... 1 tab by mouth two times a day for 14 days    Metronidazole 500 Mg Tabs (Metronidazole) .Marland Kitchen... 1 tab by mouth two times a day for 7 days for bacterial vaginosis  Orders: Urinalysis-[facility] (00000) Urine Culture-[facility] ([address]-78295) [facility]- Est  Level 4 ([address])  Problem # 4:  DIABETES MELLITUS, I (ICD-250.01) Assessment: Unchanged stop nph.  start lantus 60 and increase to get to fasting cbg <140.  see instructions below.  rtc few weeks.  continue same ssi.   The following medications were removed from the medication list:    Novolin N 100 Unit/ml Susp (Insulin isophane human) ..... Inject 45 units qam and 25 units qpm.  dispense 1 vial. Her updated medication list for this problem includes:    Novolin R 100 Unit/ml Inj Soln (Insulin regular human) .Marland Kitchen... At mealtime, use sliding scale as instructed    Lantus Solostar 100 Unit/ml Soln (Insulin glargine) ..... Inject 60 units subcutaneously each morning.  each  morning that your fasting blood sugar is >140, increase your dose by one unit.  Orders: [facility]- Est  Level 4 (99214)  Complete Medication List: 1)  Novolin R 100 Unit/ml Inj Soln (Insulin regular human) .... At mealtime, use sliding scale as instructed 2)  Fluticasone Propionate 50 Mcg/act Susp (Fluticasone propionate) .... 2 sprays per nostril daily 3)  Doxycycline Hyclate 100 Mg Caps (Doxycycline hyclate) .Marland Kitchen.. 1 tab by mouth two times a day for 14 days 4)  Onetouch Ultra Test Strp (Glucose blood) .... Test blood sugars 4 x a day 5)  Lantus Solostar 100 Unit/ml Soln (Insulin glargine) .... Inject 60 units subcutaneously each morning.  each morning that your fasting blood sugar is >140, increase your dose by one unit. 6)  Metronidazole 500 Mg Tabs (Metronidazole) .Marland Kitchen.. 1 tab by mouth two times a day for 7 days for bacterial vaginosis  Patient Instructions: 1)  It was nice to see you today.   I'm glad you are feeling better. 2)  Please schedule a follow-up appointment in 2-3 weeks.  3)  For your diabetes, STOP your novolin-N.  START taking lantus.  Take 60 units daily in the morning.  Each day that your morning fasting blood sugar is more than 140, increase your lantus by one unit. 4)  Keep using the same sliding scale insulin as before.  5)  Please keep a record of your sugars, the times and the amount of sliding scale insulin you are using.   6)  I will call you if you need antibiotics Prescriptions: METRONIDAZOLE 500 MG TABS (METRONIDAZOLE) 1 tab by mouth two times a day for 7 days for bacterial vaginosis  #14 x 0   Entered and Authorized by:   Asher Muir MD   Signed by:   Asher Muir MD on 05/31/2008   Method used:   Electronically to        [facility] (retail)       [address]       [place], [place]  [address]       Ph: [phone]       Fax: [phone]   RxID:   [id number] LANTUS SOLOSTAR 100 UNIT/ML SOLN (INSULIN GLARGINE) inject  60 units subcutaneously each morning.  Each morning that your fasting blood sugar is >  140, increase your dose by one unit.  #1 box x 12   Entered and Authorized by:   Asher Muir MD   Signed by:   Asher Muir MD on 05/31/2008   Method used:   Electronically to        [facility] (retail)       [address]       [place], [place]  [address]       Ph: [phone]       Fax: [phone]   RxID:   [id number] [place] TEST  STRP (GLUCOSE BLOOD) test blood sugars 4 X a day  #3 bottles x 12   Entered and Authorized by:   Asher Muir MD   Signed by:   Asher Muir MD on 05/31/2008   Method used:   Electronically to        [facility] (retail)       [address]       [place], [place]  [address]       Ph: [phone]       Fax: [phone]   RxID:   [id number]   Laboratory Results   Urine Tests  Date/Time Received: May 31, 2008 2:10 PM  Date/Time Reported: May 31, 2008 2:31 PM   Routine Urinalysis   Color: yellow Appearance: Clear Glucose: negative   (Normal Range: Negative) Bilirubin: negative   (Normal Range: Negative) Ketone: negative   (Normal Range: Negative) Spec. Gravity: 1.020   (Normal Range: 1.003-1.035) Blood: negative   (Normal Range: Negative) pH: 7.0   (Normal Range: 5.0-8.0) Protein: 30   (Normal Range: Negative) Urobilinogen: 0.2   (Normal Range: 0-1) Nitrite: negative   (Normal Range: Negative) Leukocyte Esterace: small   (Normal Range: Negative)  Urine Microscopic WBC/HPF: 5-15 RBC/HPF: 1-5 Bacteria/HPF: 1+ Mucous/HPF: 2+ Epithelial/HPF: 10-20    Comments: ...............test performed by......Marland KitchenBonnie A. [place], [place] (ASCP)  Date/Time Received: May 31, 2008 2:10 PM  Date/Time Reported: May 31, 2008 2:32 PM   [employer] Source: vag WBC/hpf: 10-20 Bacteria/hpf: 3+   predominately cocci with 1+ rods Clue cells/hpf: few  Positive whiff Yeast/hpf:  none Trichomonas/hpf: none Comments: ...............test performed by......Marland KitchenBonnie A. [place], [place] (ASCP)

## 2010-02-18 NOTE — Letter (Signed)
Summary: Out of School  [facility]  [address]   [place], [place] [address]   Phone: [phone]  Fax: [phone]    May 18, 2008   Student:  Barbara Clark    To Whom It May Concern:   For Medical reasons, please excuse the above named student from school for the following dates:  Start:   May 16, 2008  End:    May 19, 2008  If you need additional information, please feel free to contact our office.   Sincerely,    Arlyss Repress CMA,    ****This is a legal document and cannot be tampered with.  Schools are authorized to verify all information and to do so accordingly.

## 2010-02-18 NOTE — Progress Notes (Signed)
Summary: Triage   Phone Note Call from Patient Call back at Home Phone [phone]   Reason for Call: Talk to Nurse Summary of Call: requesting to speak with RN, sts she was given an antibiotic and since shes been taking it she has had a severe headache, wants to know what she can take with the medication? Initial call taken by: Knox Royalty,  April 21, 2007 2:12 PM  Follow-up for Phone Call        pt states she has  headache and has tried tylenol but it is not helping. suggested she try ibuprofen with food. push fluids. Follow-up by: Theresia Lo RN,  April 21, 2007 2:23 PM

## 2010-02-18 NOTE — Progress Notes (Signed)
Summary: Triage   Phone Note Call from Patient Call back at Home Phone [phone]   Reason for Call: Talk to Nurse Summary of Call: Pt is requesting to be seen this afternoon for a temp and she states everytime she blows her nose it will bleed and then she will get an awful HA. Initial call taken by: Haydee Salter,  April 20, 2007 2:56 PM  Follow-up for Phone Call        states she got a nosebleed & HA yesterday. Has been sick since the weekend. using otc. has diabetes. states her cbg was in 500s. last temp was 102. told to come in now. informed of wait. pt ok with that  Follow-up by: Golden Circle RN,  April 20, 2007 2:59 PM

## 2010-02-18 NOTE — Progress Notes (Signed)
   Phone Note Outgoing Call   Summary of Call: would you all mind calling this patient and asking her to make an appt with me within the next 6 weeks.  I just refilled her insulin, but she needs to come in for an office visit.  Thanks.  Darl Pikes Initial call taken by: Asher Muir MD,  April 09, 2008 8:59 AM      Appended Document:  pt will make an appt.

## 2010-02-18 NOTE — Letter (Signed)
Summary: Out of Work  [facility]  [address]   [place], [place] [address]   Phone: [phone]  Fax: [phone]    February 28, 2008   Employee:  ELIORA NIENHUIS    To Whom It May Concern:   For Medical reasons, please excuse the above named employee from work for the following dates:  02/28/08 - 02/29/08  If you need additional information, please feel free to contact our office.         Sincerely,    Cyndia Bent MD

## 2010-04-01 LAB — POCT URINALYSIS DIPSTICK
Bilirubin Urine: NEGATIVE
Glucose, UA: 500 mg/dL — AB
Hgb urine dipstick: NEGATIVE
Ketones, ur: NEGATIVE mg/dL
Nitrite: NEGATIVE
Protein, ur: NEGATIVE mg/dL
Specific Gravity, Urine: 1.01 (ref 1.005–1.030)
Urobilinogen, UA: 1 mg/dL (ref 0.0–1.0)
pH: 7 (ref 5.0–8.0)

## 2010-04-01 LAB — POCT RAPID STREP A (OFFICE): Streptococcus, Group A Screen (Direct): POSITIVE — AB

## 2010-04-01 LAB — POCT PREGNANCY, URINE: Preg Test, Ur: NEGATIVE

## 2010-04-01 LAB — GLUCOSE, CAPILLARY: Glucose-Capillary: 347 mg/dL — ABNORMAL HIGH (ref 70–99)

## 2010-04-02 LAB — POCT I-STAT, CHEM 8
BUN: 15 mg/dL (ref 6–23)
Calcium, Ion: 1.1 mmol/L — ABNORMAL LOW (ref 1.12–1.32)
Chloride: 102 mEq/L (ref 96–112)
Creatinine, Ser: 0.7 mg/dL (ref 0.4–1.2)
Glucose, Bld: 317 mg/dL — ABNORMAL HIGH (ref 70–99)
HCT: 40 % (ref 36.0–46.0)
Hemoglobin: 13.6 g/dL (ref 12.0–15.0)
Potassium: 3.9 mEq/L (ref 3.5–5.1)
Sodium: 136 mEq/L (ref 135–145)
TCO2: 25 mmol/L (ref 0–100)

## 2010-04-02 LAB — GLUCOSE, CAPILLARY: Glucose-Capillary: 178 mg/dL — ABNORMAL HIGH (ref 70–99)

## 2010-04-02 LAB — POCT PREGNANCY, URINE: Preg Test, Ur: NEGATIVE

## 2010-04-05 LAB — GLUCOSE, CAPILLARY
Glucose-Capillary: 249 mg/dL — ABNORMAL HIGH (ref 70–99)
Glucose-Capillary: 320 mg/dL — ABNORMAL HIGH (ref 70–99)
Glucose-Capillary: 379 mg/dL — ABNORMAL HIGH (ref 70–99)
Glucose-Capillary: 434 mg/dL — ABNORMAL HIGH (ref 70–99)

## 2010-04-05 LAB — BASIC METABOLIC PANEL
BUN: 10 mg/dL (ref 6–23)
CO2: 26 mEq/L (ref 19–32)
Calcium: 8.8 mg/dL (ref 8.4–10.5)
Chloride: 102 mEq/L (ref 96–112)
Creatinine, Ser: 0.84 mg/dL (ref 0.4–1.2)
GFR calc Af Amer: >60 mL/min (ref >60)
GFR calc non Af Amer: >60 mL/min (ref >60)
Glucose, Bld: 416 mg/dL — ABNORMAL HIGH (ref 70–99)
Potassium: 4.4 mEq/L (ref 3.5–5.1)
Sodium: 136 mEq/L (ref 135–145)

## 2010-04-05 LAB — URINALYSIS, ROUTINE W REFLEX MICROSCOPIC
Bilirubin Urine: NEGATIVE
Glucose, UA: >1000 mg/dL — AB
Ketones, ur: 40 mg/dL — AB
Leukocytes, UA: NEGATIVE
Nitrite: NEGATIVE
Protein, ur: NEGATIVE mg/dL
Specific Gravity, Urine: 1.041 — ABNORMAL HIGH (ref 1.005–1.030)
Urobilinogen, UA: 1 mg/dL (ref 0.0–1.0)
pH: 6.5 (ref 5.0–8.0)

## 2010-04-05 LAB — CBC
HCT: 32.5 % — ABNORMAL LOW (ref 36.0–46.0)
Hemoglobin: 10.8 g/dL — ABNORMAL LOW (ref 12.0–15.0)
MCH: 30.9 pg (ref 26.0–34.0)
MCHC: 33.3 g/dL (ref 30.0–36.0)
MCV: 92.5 fL (ref 78.0–100.0)
Platelets: 318 10*3/uL (ref 150–400)
RBC: 3.51 MIL/uL — ABNORMAL LOW (ref 3.87–5.11)
RDW: 13.7 % (ref 11.5–15.5)
WBC: 8.5 10*3/uL (ref 4.0–10.5)

## 2010-04-05 LAB — DIFFERENTIAL
Basophils Absolute: 0.1 10*3/uL (ref 0.0–0.1)
Basophils Relative: 1 % (ref 0–1)
Eosinophils Absolute: 0.2 10*3/uL (ref 0.0–0.7)
Eosinophils Relative: 2 % (ref 0–5)
Lymphocytes Relative: 42 % (ref 12–46)
Lymphs Abs: 3.6 10*3/uL (ref 0.7–4.0)
Monocytes Absolute: 0.6 10*3/uL (ref 0.1–1.0)
Monocytes Relative: 7 % (ref 3–12)
Neutro Abs: 4.1 10*3/uL (ref 1.7–7.7)
Neutrophils Relative %: 48 % (ref 43–77)

## 2010-04-05 LAB — POCT PREGNANCY, URINE: Preg Test, Ur: NEGATIVE

## 2010-04-05 LAB — URINE MICROSCOPIC-ADD ON

## 2010-04-25 LAB — COMPREHENSIVE METABOLIC PANEL
ALT: 16 U/L (ref 0–35)
AST: 16 U/L (ref 0–37)
Albumin: 3.1 g/dL — ABNORMAL LOW (ref 3.5–5.2)
Alkaline Phosphatase: 92 U/L (ref 39–117)
BUN: 6 mg/dL (ref 6–23)
CO2: 29 mEq/L (ref 19–32)
Calcium: 8.5 mg/dL (ref 8.4–10.5)
Chloride: 97 mEq/L (ref 96–112)
Creatinine, Ser: 0.88 mg/dL (ref 0.4–1.2)
GFR calc Af Amer: >60 mL/min (ref >60)
GFR calc non Af Amer: >60 mL/min (ref >60)
Glucose, Bld: 340 mg/dL — ABNORMAL HIGH (ref 70–99)
Potassium: 4.3 mEq/L (ref 3.5–5.1)
Sodium: 134 mEq/L — ABNORMAL LOW (ref 135–145)
Total Bilirubin: 0.9 mg/dL (ref 0.3–1.2)
Total Protein: 7.1 g/dL (ref 6.0–8.3)

## 2010-04-25 LAB — CBC
HCT: 33.1 % — ABNORMAL LOW (ref 36.0–46.0)
Hemoglobin: 10.9 g/dL — ABNORMAL LOW (ref 12.0–15.0)
MCHC: 32.8 g/dL (ref 30.0–36.0)
MCV: 93.2 fL (ref 78.0–100.0)
Platelets: 306 10*3/uL (ref 150–400)
RBC: 3.56 MIL/uL — ABNORMAL LOW (ref 3.87–5.11)
RDW: 12.9 % (ref 11.5–15.5)
WBC: 10.7 10*3/uL — ABNORMAL HIGH (ref 4.0–10.5)

## 2010-04-25 LAB — DIFFERENTIAL
Basophils Absolute: 0 10*3/uL (ref 0.0–0.1)
Basophils Relative: 0 % (ref 0–1)
Eosinophils Absolute: 0 10*3/uL (ref 0.0–0.7)
Eosinophils Relative: 0 % (ref 0–5)
Lymphocytes Relative: 10 % — ABNORMAL LOW (ref 12–46)
Lymphs Abs: 1 10*3/uL (ref 0.7–4.0)
Monocytes Absolute: 1 10*3/uL (ref 0.1–1.0)
Monocytes Relative: 9 % (ref 3–12)
Neutro Abs: 8.7 10*3/uL — ABNORMAL HIGH (ref 1.7–7.7)
Neutrophils Relative %: 81 % — ABNORMAL HIGH (ref 43–77)

## 2010-04-25 LAB — URINE MICROSCOPIC-ADD ON

## 2010-04-25 LAB — POCT PREGNANCY, URINE: Preg Test, Ur: NEGATIVE

## 2010-04-25 LAB — URINE CULTURE: Colony Count: >=100000

## 2010-04-25 LAB — URINALYSIS, ROUTINE W REFLEX MICROSCOPIC
Bilirubin Urine: NEGATIVE
Glucose, UA: >1000 mg/dL — AB
Ketones, ur: >80 mg/dL — AB
Nitrite: NEGATIVE
Protein, ur: 100 mg/dL — AB
Specific Gravity, Urine: 1.024 (ref 1.005–1.030)
Urobilinogen, UA: 1 mg/dL (ref 0.0–1.0)
pH: 6 (ref 5.0–8.0)

## 2010-04-25 LAB — GLUCOSE, CAPILLARY
Glucose-Capillary: 241 mg/dL — ABNORMAL HIGH (ref 70–99)
Glucose-Capillary: 334 mg/dL — ABNORMAL HIGH (ref 70–99)

## 2010-04-25 LAB — LIPASE, BLOOD: Lipase: 11 U/L (ref 11–59)

## 2010-04-26 LAB — BASIC METABOLIC PANEL
BUN: 9 mg/dL (ref 6–23)
CO2: 28 mEq/L (ref 19–32)
Calcium: 8.3 mg/dL — ABNORMAL LOW (ref 8.4–10.5)
Chloride: 101 mEq/L (ref 96–112)
Creatinine, Ser: 0.62 mg/dL (ref 0.4–1.2)
GFR calc Af Amer: >60 mL/min (ref >60)
GFR calc non Af Amer: >60 mL/min (ref >60)
Glucose, Bld: 306 mg/dL — ABNORMAL HIGH (ref 70–99)
Potassium: 4 mEq/L (ref 3.5–5.1)
Sodium: 132 mEq/L — ABNORMAL LOW (ref 135–145)

## 2010-04-26 LAB — GLUCOSE, CAPILLARY
Glucose-Capillary: 218 mg/dL — ABNORMAL HIGH (ref 70–99)
Glucose-Capillary: 243 mg/dL — ABNORMAL HIGH (ref 70–99)
Glucose-Capillary: 300 mg/dL — ABNORMAL HIGH (ref 70–99)
Glucose-Capillary: 354 mg/dL — ABNORMAL HIGH (ref 70–99)

## 2010-04-26 LAB — URINALYSIS, ROUTINE W REFLEX MICROSCOPIC
Bilirubin Urine: NEGATIVE
Glucose, UA: >1000 mg/dL — AB
Hgb urine dipstick: NEGATIVE
Ketones, ur: NEGATIVE mg/dL
Leukocytes, UA: NEGATIVE
Nitrite: NEGATIVE
Protein, ur: NEGATIVE mg/dL
Specific Gravity, Urine: 1.039 — ABNORMAL HIGH (ref 1.005–1.030)
Urobilinogen, UA: 1 mg/dL (ref 0.0–1.0)
pH: 6.5 (ref 5.0–8.0)

## 2010-04-26 LAB — URINE MICROSCOPIC-ADD ON

## 2010-04-26 LAB — PREGNANCY, URINE: Preg Test, Ur: NEGATIVE

## 2010-04-30 LAB — URINALYSIS, ROUTINE W REFLEX MICROSCOPIC
Bilirubin Urine: NEGATIVE
Glucose, UA: NEGATIVE mg/dL
Ketones, ur: NEGATIVE mg/dL
Nitrite: POSITIVE — AB
Protein, ur: 30 mg/dL — AB
Specific Gravity, Urine: 1.012 (ref 1.005–1.030)
Urobilinogen, UA: 1 mg/dL (ref 0.0–1.0)
pH: 6 (ref 5.0–8.0)

## 2010-04-30 LAB — POCT I-STAT, CHEM 8
BUN: 7 mg/dL (ref 6–23)
Calcium, Ion: 0.98 mmol/L — ABNORMAL LOW (ref 1.12–1.32)
Chloride: 104 mEq/L (ref 96–112)
Creatinine, Ser: 0.9 mg/dL (ref 0.4–1.2)
Glucose, Bld: 130 mg/dL — ABNORMAL HIGH (ref 70–99)
HCT: 35 % — ABNORMAL LOW (ref 36.0–46.0)
Hemoglobin: 11.9 g/dL — ABNORMAL LOW (ref 12.0–15.0)
Potassium: 3.3 mEq/L — ABNORMAL LOW (ref 3.5–5.1)
Sodium: 137 mEq/L (ref 135–145)
TCO2: 22 mmol/L (ref 0–100)

## 2010-04-30 LAB — GLUCOSE, CAPILLARY
Glucose-Capillary: 138 mg/dL — ABNORMAL HIGH (ref 70–99)
Glucose-Capillary: 242 mg/dL — ABNORMAL HIGH (ref 70–99)
Glucose-Capillary: 353 mg/dL — ABNORMAL HIGH (ref 70–99)

## 2010-04-30 LAB — CBC
HCT: 33.6 % — ABNORMAL LOW (ref 36.0–46.0)
Hemoglobin: 11.1 g/dL — ABNORMAL LOW (ref 12.0–15.0)
MCHC: 33 g/dL (ref 30.0–36.0)
MCV: 93.3 fL (ref 78.0–100.0)
Platelets: 307 10*3/uL (ref 150–400)
RBC: 3.6 MIL/uL — ABNORMAL LOW (ref 3.87–5.11)
RDW: 12.9 % (ref 11.5–15.5)
WBC: 9.8 10*3/uL (ref 4.0–10.5)

## 2010-04-30 LAB — URINE MICROSCOPIC-ADD ON

## 2010-04-30 LAB — DIFFERENTIAL
Basophils Absolute: 0 10*3/uL (ref 0.0–0.1)
Basophils Relative: 0 % (ref 0–1)
Eosinophils Absolute: 0 10*3/uL (ref 0.0–0.7)
Eosinophils Relative: 0 % (ref 0–5)
Lymphocytes Relative: 10 % — ABNORMAL LOW (ref 12–46)
Lymphs Abs: 0.9 10*3/uL (ref 0.7–4.0)
Monocytes Absolute: 0.7 10*3/uL (ref 0.1–1.0)
Monocytes Relative: 7 % (ref 3–12)
Neutro Abs: 8.1 10*3/uL — ABNORMAL HIGH (ref 1.7–7.7)
Neutrophils Relative %: 83 % — ABNORMAL HIGH (ref 43–77)

## 2010-04-30 LAB — POCT PREGNANCY, URINE: Preg Test, Ur: NEGATIVE

## 2010-05-06 LAB — BASIC METABOLIC PANEL
BUN: 4 mg/dL — ABNORMAL LOW (ref 6–23)
BUN: 5 mg/dL — ABNORMAL LOW (ref 6–23)
BUN: 9 mg/dL (ref 6–23)
CO2: 22 mEq/L (ref 19–32)
CO2: 26 mEq/L (ref 19–32)
CO2: 26 mEq/L (ref 19–32)
Calcium: 7.7 mg/dL — ABNORMAL LOW (ref 8.4–10.5)
Calcium: 7.8 mg/dL — ABNORMAL LOW (ref 8.4–10.5)
Calcium: 8 mg/dL — ABNORMAL LOW (ref 8.4–10.5)
Chloride: 105 mEq/L (ref 96–112)
Chloride: 106 mEq/L (ref 96–112)
Chloride: 99 mEq/L (ref 96–112)
Creatinine, Ser: 0.51 mg/dL (ref 0.4–1.2)
Creatinine, Ser: 0.53 mg/dL (ref 0.4–1.2)
Creatinine, Ser: 0.63 mg/dL (ref 0.4–1.2)
GFR calc Af Amer: >60 mL/min (ref >60)
GFR calc Af Amer: >60 mL/min (ref >60)
GFR calc Af Amer: >60 mL/min (ref >60)
GFR calc non Af Amer: >60 mL/min (ref >60)
GFR calc non Af Amer: >60 mL/min (ref >60)
GFR calc non Af Amer: >60 mL/min (ref >60)
Glucose, Bld: 134 mg/dL — ABNORMAL HIGH (ref 70–99)
Glucose, Bld: 190 mg/dL — ABNORMAL HIGH (ref 70–99)
Glucose, Bld: 388 mg/dL — ABNORMAL HIGH (ref 70–99)
Potassium: 3.4 mEq/L — ABNORMAL LOW (ref 3.5–5.1)
Potassium: 3.9 mEq/L (ref 3.5–5.1)
Potassium: 3.9 mEq/L (ref 3.5–5.1)
Sodium: 128 mEq/L — ABNORMAL LOW (ref 135–145)
Sodium: 134 mEq/L — ABNORMAL LOW (ref 135–145)
Sodium: 135 mEq/L (ref 135–145)

## 2010-05-06 LAB — DIFFERENTIAL
Basophils Absolute: 0 10*3/uL (ref 0.0–0.1)
Basophils Relative: 1 % (ref 0–1)
Eosinophils Absolute: 0.3 10*3/uL (ref 0.0–0.7)
Eosinophils Relative: 4 % (ref 0–5)
Lymphocytes Relative: 42 % (ref 12–46)
Lymphs Abs: 2.6 10*3/uL (ref 0.7–4.0)
Monocytes Absolute: 0.2 10*3/uL (ref 0.1–1.0)
Monocytes Relative: 4 % (ref 3–12)
Neutro Abs: 3 10*3/uL (ref 1.7–7.7)
Neutrophils Relative %: 49 % (ref 43–77)

## 2010-05-06 LAB — POCT PREGNANCY, URINE: Preg Test, Ur: NEGATIVE

## 2010-05-06 LAB — URINALYSIS, ROUTINE W REFLEX MICROSCOPIC
Bilirubin Urine: NEGATIVE
Glucose, UA: >1000 mg/dL — AB
Hgb urine dipstick: NEGATIVE
Ketones, ur: 15 mg/dL — AB
Leukocytes, UA: NEGATIVE
Nitrite: NEGATIVE
Protein, ur: NEGATIVE mg/dL
Specific Gravity, Urine: 1.042 — ABNORMAL HIGH (ref 1.005–1.030)
Urobilinogen, UA: 1 mg/dL (ref 0.0–1.0)
pH: 6 (ref 5.0–8.0)

## 2010-05-06 LAB — GLUCOSE, CAPILLARY
Glucose-Capillary: 116 mg/dL — ABNORMAL HIGH (ref 70–99)
Glucose-Capillary: 216 mg/dL — ABNORMAL HIGH (ref 70–99)
Glucose-Capillary: 271 mg/dL — ABNORMAL HIGH (ref 70–99)
Glucose-Capillary: 296 mg/dL — ABNORMAL HIGH (ref 70–99)
Glucose-Capillary: 355 mg/dL — ABNORMAL HIGH (ref 70–99)
Glucose-Capillary: 441 mg/dL — ABNORMAL HIGH (ref 70–99)

## 2010-05-06 LAB — RAPID STREP SCREEN (MED CTR MEBANE ONLY): Streptococcus, Group A Screen (Direct): NEGATIVE

## 2010-05-06 LAB — CBC
HCT: 36.3 % (ref 36.0–46.0)
Hemoglobin: 12 g/dL (ref 12.0–15.0)
MCHC: 33.1 g/dL (ref 30.0–36.0)
MCV: 93.2 fL (ref 78.0–100.0)
Platelets: 276 10*3/uL (ref 150–400)
RBC: 3.89 MIL/uL (ref 3.87–5.11)
RDW: 13.2 % (ref 11.5–15.5)
WBC: 6.1 10*3/uL (ref 4.0–10.5)

## 2010-05-06 LAB — POCT I-STAT 3, VENOUS BLOOD GAS (G3P V)
Acid-Base Excess: 2 mmol/L (ref 0.0–2.0)
Bicarbonate: 27.6 mEq/L — ABNORMAL HIGH (ref 20.0–24.0)
O2 Saturation: 46 %
Patient temperature: 97.1
TCO2: 29 mmol/L (ref 0–100)
pCO2, Ven: 45.8 mmHg (ref 45.0–50.0)
pH, Ven: 7.384 — ABNORMAL HIGH (ref 7.250–7.300)
pO2, Ven: 25 mmHg — CL (ref 30.0–45.0)

## 2010-05-06 LAB — URINE MICROSCOPIC-ADD ON

## 2010-06-06 NOTE — Discharge Summary (Signed)
[facility]  Patient:    Barbara Clark, Barbara Clark Visit Number: [id number] MRN: [id number]          Service Type: EMS Location: [facility] Attending Physician:  Pearletha Alfred Dictated by:   Ed Blalock. Burnadette Peter, M.D. Admit Date:  06/15/2001 Discharge Date: 06/15/2001                             Discharge Summary  DATE OF BIRTH:                August 17, 1975  HISTORY OF PRESENT ILLNESS:   The patient is a 35 year old G2, P1-0-0-1, at 29-6/7 weeks by a 9-week ultrasound who had presyncopal episode earlier on the morning of admission.  The patient reported a three- or four-day history of nausea, vomiting, and diarrhea.  She had family members with a similar history.  She was also recently started on Flagyl as well as Bactrim for a UTI and for cervicitis, and she reports a strong history of antibiotic-induced diarrhea.  The patient was on her way to the high-risk clinic and reported getting off the bus and then feeling lightheaded and is not exactly sure whether or not she passed out but does remember being helped to a wheelchair and taken to high-risk clinic where her blood pressure was noted to be 66/38. The nursing staff contacted me, and I ordered IV started and fluid bolus and for the patient to be transported to the [facility] ER.  In the [facility] ER after speaking with the on-call physician, the patient was evaluated for DKA and hypovolemia.  She was found not to be in DKA.  She was fluid bolused.  Her blood pressure returned up to 90s/60s, and she was transferred to [facility] for care, high-risk clinic, at 9 weeks.  Pregnancy complicated by insulin-dependent diabetes, previous hospitalization for DKA, GBS positivity.  MEDICATIONS:                  NPH and Regular Insulin.  ALLERGIES:                    No known drug allergies.  OBSTETRICAL HISTORY:          One prior delivery.  MEDICAL AND GYNECOLOGICAL HISTORY:                      Pregestational  diabetes.  FAMILY, SOCIAL HISTORY:       No tobacco or alcohol or illicit drug use.  LABORATORY DATA:              Prenatal laboratories were done and were normal.  PHYSICAL EXAMINATION:  VITAL SIGNS:                  On presentation the patients blood pressure was 113/68, respirations 18, heart rate 86, temperature 98.7.  GENERAL:                      In no acute distress.  CARDIOVASCULAR:               Regular rate and rhythm.  LUNGS:                        Clear to auscultation.  ABDOMEN:                      Gravid, nontender.  EXTREMITIES:                  With 2+ pulses, no edema.  NEUROLOGIC:                   With 2+ DTRs.  ______ enzyme was not done.  GENITOURINARY:                Cervical exam was not done.  Fetal heart rate initially by Doppler was 160.  She was not contracting.  LABORATORY DATA:              In [place] revealed H&H 11 and 33.  Sodium 135, potassium 4.2, chloride 105, bicarbonate 23, glucose 170.  Ketones negative. Amylase and lipase negative.  ABG showed a pH 7.366.  HOSPITAL COURSE:              The patient continued to receive IV fluid, hydration on hospital days 1 and 2.  On hospital day #2, mid morning, her IV was Hep-Locked, and she was continued on p.o. to see how she would tolerate this.  She was encouraged to ambulate.  She did so without any difficulty. She was eating although she did have some nausea but was tolerating p.o. including solids.  She had only two diarrheal stools on the date of discharge. She remained stable from a diabetes standpoint, continued on her home regime of insulin.  The patient was, therefore, discharged home with follow-up in the high-risk clinic.  DISCHARGE MEDICATIONS:        Phenergan 25 mg p.o. q.6h. as needed.  DIET:                         ADA 2000 calorie diet, no concentrated sweets.  ACTIVITY:                     As tolerated.  FOLLOW-UP:                    High-risk clinic Wednesday, June 22, 2001.   She is to return to [facility] prior to this if she has any problems.  CONDITION ON DISCHARGE:       Improved.  DISCHARGE DIAGNOSES:          1. Intrauterine pregnancy at 30 weeks at                                  discharge.                               2. Presyncopal episode.                               3. Acute gastroenteritis.                               4. Antibiotic-induced diarrhea. Dictated by:   Ed Blalock. Burnadette Peter, M.D. Attending Physician:  [place] B DD:  06/16/01 TD:  06/20/01 Job: [id number]

## 2010-06-06 NOTE — H&P (Signed)
[facility]  Patient:    Barbara Clark, Barbara Clark                        MRN: [id number] Adm. Date:  [id number] Attending:  Benny Lennert CC:         Estill Batten. Chambliss, M.D., [facility]   History and Physical  HISTORY OF PRESENT ILLNESS:  This is a 35 year old black female with a past medical history significant for IDDM x 3 years and a history of genital herpes (only one outbreak).  CHIEF COMPLAINT:  "Vomiting and I havent taken my insulin in the last few days," (story varies), ? reliability.  HISTORY OF PRESENT ILLNESS:  Patient felt fine on Saturday, March 20, 2000. That afternoon, she had a drink, she threw up shortly thereafter and has vomited several times since then.  She has had no diarrhea, no blood in her vomitus, no recent fevers, chills, cough, headaches, chest pain, abdominal discomfort.  She says she has been urinating a lot more recently and has lost a lot of weight over the last several days.  She usually takes 35 units of Lente at night and uses a sliding scale during the rest of the day.  She is not sure what her sliding-scale dose is.  She has an appointment at [facility] tomorrow morning.  She believes her usual weight is about 110 pounds.  She cannot remember what she has eaten recently and when exactly her last dose of insulin was.  PAST MEDICAL HISTORY Hospitalizations/surgeries:  She was hospitalized for DKA in October of last surgery; unfortunately, that chart cannot be found.  She says she has been in the hospital several times related to her diabetes.  Illnesses:  IDDM and genital herpes.  MEDICATIONS:  Lente 35 units subcu q.p.m., sliding-scale insulin with ? dose rest of the time.  She cannot tell me how often she checks her sugars.  ALLERGIES:  No known drug allergies.  SOCIAL HISTORY:  She tells me she does not smoke but other part in the chart says she does smoke.  She says she does not  drink, yet had an alcoholic drink at the start of her vomiting two days ago.  She does no illicit drugs.  FAMILY HISTORY:  A grandmother had diabetes, otherwise, unobtainable.  REVIEW OF SYSTEMS:  Significant weight loss over the last few days but no fevers, chills, cough, shortness of breath, chest pain, diarrhea, constipation, headaches, pharyngitis, rashes.  PHYSICAL EXAMINATION  GENERAL:  Slender, lethargic black female, sleeping and at times semi-difficult to arouse.  VITAL SIGNS:  Blood pressure 95/62, pulse 97 and regular, respirations 20, temperature 98.7.  HEENT:  Normocephalic, atraumatic.  Pupils are equal and reactive.  TMs and EACs okay.  Oropharynx:  Mucous membranes are dry.  NECK:  Supple without thyromegaly or significant nodes.  LUNGS:  Clear without rales, wheezes or rhonchi.  COR:  Normal S1 and S2.  No murmurs, gallops or rubs.  ABDOMEN:  Positive bowel sounds.  Soft, nontender.  No masses.  GU:  Deferred, not pertinent to admission.  RECTAL:  Deferred, not pertinent to admission.  BREASTS:  Deferred, not pertinent to admission.  EXTREMITIES:  No edema.  Dorsalis pedis and posterior tibial pulses present.  NEUROLOGIC:  Lethargic, nonlateralizing, alert at times, though oriented to person and place only.  LABORATORY AND X-RAY FINDINGS:  PCO2 of 15.2, bicarb of 4.2, pH of 7.063. Hemoglobin 15.6, hematocrit 48,  WBC count 14,000.  Sodium 136, potassium 5.2, CO2 8, chloride 105, BUN 19, creatinine 1.3, glucose 414, calcium 9.8.  Urine reveals specific gravity of 1.030, pH of 5.5, greater than 1000 glucose, greater than 80 ketones, negative nitrites, negative LET.  EKG showed a sinus tachycardia.  ASSESSMENT AND PLAN 1. Insulin-dependent diabetes mellitus with diabetic ketoacidosis.  Will    obtain old records, do one-hour capillary blood glucoses, two-hour basic    metabolic panels and pHs, intravenous insulin at 1 u/kg per hour,    intravenous  fluids two and a half bags will run in wide open, then will run    at 300 cc/hr.  May consult endocrinology in the morning.  Replace potassium    when needed. 2. Herpes:  Has only had one episode. DD:  03/22/00 TD:  03/22/00 Job: [id number]

## 2010-06-06 NOTE — Op Note (Signed)
   NAME:  Barbara Clark, Barbara Clark                         ACCOUNT NO.:  [id number]   MEDICAL RECORD NO.:  [id number]                   PATIENT TYPE:  INP   LOCATION:  9131                                 FACILITY:  [facility]   PHYSICIAN:  Clement Husbands, M.D.         DATE OF BIRTH:  08-Mar-1975   DATE OF PROCEDURE:  09/08/2002  DATE OF DISCHARGE:  09/09/2002                                 OPERATIVE REPORT   PREOPERATIVE DIAGNOSIS:  Requested sterilization.   POSTOPERATIVE DIAGNOSIS:  Requested sterilization.   OPERATION/PROCEDURE:  Postpartum bilateral tubal ligation with partial  salpingectomy.   SURGEON:  Burnadette Peter, M.D.   ANESTHESIA:  Epidural.   PROCEDURE:  With the patient under satisfactory epidural anesthesia in the  supine position, the abdomen was prepped and draped.  A small  transverse  infraumbilical skin incision was made and with the scissors, the fascia was  entered and the peritoneal cavity entered.  The fundus of the uterus was at  least two fingerbreadths above the umbilicus.  With pressure from below the  right cornual area, the uterus was seen and the right fallopian tube was  identified.  It was grasped in its mid portion and traced out to the  fimbria.  Mid portion of the right fallopian tube was then doubly ligated  with 0 plain catgut with the ligated segment excised with nonopposing edges.  Attention was turned to the left side where a similar procedure was done.  The tube on the left side, however, was doubly ligated a little bit further  out towards the fimbria.  Ligated segment was also excised with nonopposing  edges.  Retractor was removed.  The peritoneum and fascia were closed  together with a running 0 Vicryl suture.  The skin was approximated with a  subcuticular 3-0 Vicryl suture.  Blood loss was negligible.  Sponge and  needle count was correct.  The patient tolerated the procedure well and was  returned to the recovery room in satisfactory  condition.                                                Clement Husbands, M.D.    EFR/MEDQ  D:  09/08/2002  T:  09/09/2002  Job:  [id number]

## 2010-06-06 NOTE — Discharge Summary (Signed)
[facility]  Patient:    Barbara Clark, Barbara Clark                        MRN: [id number] Adm. Date:  [id number] Disc. Date: [id number] Attending:  Valera Castle CC:         [place]. [facility]   Discharge Summary  DATE OF BIRTH:  Jul 06, 1975  ADMISSION DIAGNOSIS:  Diabetic ketoacidosis.  DISCHARGE DIAGNOSES: 1. Diabetic ketoacidosis, resolved. 2. Insulin-dependent diabetes mellitus. 3. Tobacco abuse.  CONSULTS:  Endocrine, Tera Mater. Evlyn Kanner, M.D.  PROCEDURES:  Electrocardiogram showing sinus tachycardia with a rate of 101.  HISTORY OF PRESENT ILLNESS:  Barbara Clark is a 35 year old black female with a history of IDDM x 3 years, who was recently admitted in January for DKA.  She ran out of her Humalog Insulin and sugars became uncontrolled.  She did remain on her Lantus Insulin 35 units at bedtime.  The glucose on admission was 414 and ABGs showed a pH of 7.063.  HOSPITAL COURSE:  She was started on IV insulin and normal saline after boluses at a rate of 300 cc/hr.  Tera Mater. Evlyn Kanner, M.D., was consulted. Sugars became much better controlled and she was switched to subcutaneous insulin with a sliding scale, as well as remaining on the Lantus 35 units at bedtime.  Her blood pH rapidly normalized, as well as sugars returning to a more acceptable range.  She did receive diabetic education concerning diet, checking sugars, and administrating her insulin and did demonstrated proficiency with this prior to her discharge.  She was supposed to follow up with the family practice center at [facility]. [facility], but was hospitalized at [facility] during this time, so an appointment is to be made prior to her discharge with the family practice center for follow-up within the next week.  She will also be sent out with Humalog and Lantus Insulin.  She said that she ran out because she could not afford to buy  the insulin.  LABORATORY DATA:  UA on admission with glucose greater than 1000, trace hemoglobin, greater than 80 ketones, 100 protein, a few epithelial cells, negative LE, negative nitrite, and 0-5 red blood cells.  Later on March 22, 2000, urine acetone went from large to negative and serum acetone also went from small to moderate to then negative.  CBC:  WBC 14.0, hemoglobin 15.6, hematocrit 48.0, MCV 98.2, platelets 449, 85% neutrophils, 11.9 ANC, 10% lymphocytes, and remainder of differential normal.  On admission, sodium 136, potassium 5.2, chloride 105, bicarbonate 8, glucose 414, BUN 19, creatinine 1.3, and calcium 9.8.  On March 23, 2000, sodium 139, potassium 3.5, chloride 119, bicarbonate 19, glucose 133, BUN 6, creatinine 0.6, and calcium 7.4.  On admission, ABG on room air 7.063/15.2/129.3/4.2.  Nineteen hours later, ABG on room air 7.332/32.1/106.9/16.6.  DISCHARGE MEDICATIONS: 1. Lantus Insulin 35 units q.h.s. 2. Sliding scale insulin.  If sugar less than 30, orange juice or skimmed milk    and crackles and recheck sugar in one hour.  If sugar 70-150, 0 units of    Humalog.  If 151-200, 3 units of Humalog.  If 201-250, 6 units of Humalog.    If 251-300, 9 units of Humalog.  If greater than 300, 11 units of Humalog    and call family practice center at [facility] [facility]. [facility].  DIET:  Strict diabetic diet.  SPECIAL INSTRUCTIONS:  No alcohol.  No tobacco.  Check sugar a.c. and q.h.s. Will consider getting Nicoderm patch over-the-counter after discharge.  FOLLOW-UP:  Nutritional and diabetic management center to call the patient to schedule an appointment with a dietician.  An appointment will be set up for the family practice center at [facility]. [facility] within the next week and this will be made prior to discharge from the hospital to stress the importance of diet, following insulin schedule instructions strictly, and checking sugars  q.i.d.  CONDITION ON DISCHARGE:  Improved. DD:  03/24/00 TD:  03/24/00 Job: [id number]

## 2010-06-06 NOTE — Op Note (Signed)
NAME:  Barbara Clark, Barbara Clark                         ACCOUNT NO.:  [id number]   MEDICAL RECORD NO.:  [id number]                   PATIENT TYPE:  INP   LOCATION:  9131                                 FACILITY:  [facility]   PHYSICIAN:  Tanya S. Shawnie Pons, M.D.                DATE OF BIRTH:  [birth year]   DATE OF PROCEDURE:  09/07/2002  DATE OF DISCHARGE:                                 OPERATIVE REPORT   PREOPERATIVE DIAGNOSES:  1. Intrauterine pregnancy at 39 weeks.  2. Class B diabetes, poorly-controlled.  3. Active labor.  4. Chorioamnionitis.   DELIVERY NOTE:  I assumed care of this patient from the teaching service at  approximately 5 p.m. on the night of this delivery.  I was told the patient  was 8-9 and had been for the better part of three hours.  I did check this  patient at approximately 8 p.m. and found her to be an anterior lip and 0  station.  She was ROP at the time.  The patient was then turned exaggerated  Sims and Pitocin started, for her labor was inadequate, and an hour and a  half later the patient was found to be complete.  She did have fever at this  time.  She was on ampicillin and gentamicin for control of this.  She was a  known class B diabetic, poorly-controlled, and had not had a recent growth  scan.  Per the patient and my exam, the baby felt to be approximately 8-1/2  to 9 pounds.  The patient did report that her last baby was 8 pounds 5  ounces and that this baby did not feel significantly bigger than that.  As  the patient became complete, she began pushing and pushed for approximately  45 minutes with the nurse.  Upon my entry into the room she pushed for  approximately 10-15 more minutes with good advancement of the head with each  subsequent push.  The head delivered easily; however, a turtle sign was  noted.  At that point I called for help.  A call to the NICU was placed.  McRoberts and suprapubic efforts were tried without success.  A large  episiotomy was cut  posteriorly and again McRoberts and suprapubic tried with  no dislodging of the anterior shoulder.  Corkscrew was tried in both  directions; however, the anterior shoulder would not budge.  Attempt was  made to deliver the posterior arm; however, this could not be easily  accomplished.  A call was then made overhead for any OB while efforts  remained to try to deliver the infant.  Carrington Clamp, M.D., then came  into the room, put on gloves, and assessed the situation.  After  approximately a minute and a half, she had the posterior arm delivered and  the rest of the baby came out easily.  The cord was clamped x2 and the baby  taken to the awaiting pediatricians.  This was a viable female infant.  Apgars were 1, 7, and 8.  Cord pH was obtained; however, the sample was  inadequate.  Subsequently the placenta delivered spontaneously and intact  with a three-vessel cord.  After injection with local, the second degree  midline episiotomy was repaired with 3-0 running Vicryl without problem or  without difficulty.  Discussion with pediatrics revealed that the infant  would go to the central nursery and was already moving its left arm although  the movement was not  as brisk as the right arm.  The patient tolerated the  procedure well and was ready for transfer to the mother-baby unit  postpartum.                                               Shelbie Proctor. Shawnie Pons, M.D.    TSP/MEDQ  D:  09/07/2002  T:  09/08/2002  Job:  [id number]

## 2010-06-06 NOTE — Discharge Summary (Signed)
[facility]  Patient:    Barbara Clark, PAVON Visit Number: [id number] MRN: [id number]          Service Type: OBS Location: 9400 [id number] Attending Physician:  Enid Cutter Dictated by:   Marlinda Mike, CNM Admit Date:  04/28/2001 Discharge Date: 04/30/2001                             Discharge Summary  DATE OF BIRTH:                1975/03/06.  ADMITTING DIAGNOSES:          1. Intrauterine pregnancy at 22 weeks 6 days.                               2. Insulin-dependent diabetes mellitus with                                  poor control.                               3. Early diabetic ketoacidosis.  DISCHARGE STATUS:             Discharged April 30, 2001, against medical                               advice.  DISCHARGE DIAGNOSES:          1. Intrauterine pregnancy at 23 weeks 1 day.                               2. Resolving diabetic ketoacidosis with                                  suboptimal glycemic control.                               3. Patient signed herself out of hospital                                  against medical advice after counseling                                  related to risk and benefit of                                  hospitalization and complete glyecemic                                  control prior to discharge.  HISTORY OF PRESENT ILLNESS:   The patient is a 35 year old gravida 2, para 1-0-0-1, with a last menstrual period of unsure.  The patient is dated by a nine-week ultrasound, which gives her an [facility] of August 26, 2001.  The patient is currently on insulin management of insulin-dependent diabetes.  She is on prenatal vitamins and suppression with Keflex.  The patient is currently followed at the high risk OB clinic since approximately [redacted] weeks gestation for risk factors of insulin-dependent diabetes, recurrent strep bacteria.  ALLERGIES:                    No known drug allergies.  PAST MEDICAL HISTORY:          Patient diagnosed with insulin-dependent diabetes approximately three years ago.  History of recurrent diabetic ketoacidosis related to noncompliance.  PAST SURGICAL HISTORY:        The patient has no surgical history.  OBSTETRICAL HISTORY:          Previous pregnancy with a spontaneous vaginal birth.  The patient has a history of HSV with the last outbreak prior to pregnancy.  SOCIAL HISTORY:               The patient denies any substance use.  History of tobacco use, half-pack to one pack per day.  Quit at onset of current pregnancy.  PRENATAL LABORATORY DATA:     The patient is B positive, antibody screen negative.  Rubella nonimmune.  Hepatitis negative.  Syphilis nonreactive.  HIV nonreactive.  GC and Chlamydia cultures were both negative.  History of positive group beta strep both from vaginal swabs and with current bacteriuria.  CURRENT HISTORY:              The patient presents to the maternity admissions unit with complaint of vomiting and headache x1-2 days.  The patient states blood sugars greater than 300 with fasting on day of admission at 325.  The patient states no insulin and unable to tolerate any food or liquid x1 day.  PHYSICAL EXAMINATION:  PHYSICAL EXAMINATION:         On day of admission, temperature 98, pulse 104, respirations 26, blood pressure 126/84.  Fetal heart rate of 160s.  No contractions.  ADMISSION LABORATORY DATA:    CBG in maternity admissions of 290.  UA revealed greater than 80 ketones, glucose of 250, specific gravity of 1.030.  No leukocyte esterase, and negative for nitrite.  Initial blood gas on room air shows a pH of 7.34, a PCO2 of 30, PO2 of 95, a bicarbonate of 15.6.  CBC: White blood cell count of 12.8, hemoglobin of 12.1, hematocrit of 35.7, platelet count of 315.  Glucose of 241 from serial blood tests, BUN of 5, creatinine of 0.5.  Serum acetone positive in small amount.  CO2 17.  Urine culture during hospitalization  revealed negative, just positive for yeast. Wet prep revealed many white blood cells, negative Trichomonas, moderate yeast, no clue cells.  Lab values on April 30, 2001, on day the patient signed AMA out of hospital:  Glucose 224 from serum stick, BUN still of 5, creatinine of 0.5, CO2 now of 25.  Blood gas 7.4, PCO2 of 37.7, PO2 of 82, and bicarbonate of 23.2.  HOSPITAL COURSE:              The patient underwent IV hydration during hospitalization and placed on a Glucommander insulin drip.  History of hyperglycemia and DKA.  Patient treated for Monilia vaginitis during hospitalization.  On day April 30, 2001, patient tearful, desires to go home. The patient discussed blood sugars prior to discharge home are suboptimal. Risk of health.  Patient aware of risk of discharge home.  Patient states  she is leaving ambulatory.  The patient agrees to follow insulin regimen at home and to return with any onset of vomiting or symptomology.  Patient to return to the high risk OB clinic on Wednesday for evaluation.  Dr. Orlene Erm notified that the patient would be leaving hospital.  The patient signed against medical advice form after counseling, left ambulatory with resolving DKA. Postprandial blood sugar level prior to discharge at two hours was 123, which was greatly improved.  Patient tolerating diet at time of leaving the hospital.  Fetal heart rate tracing had shown no contractions during hospitalization with fetal heart tones by Doppler between 135 and 145. Dictated by:   Marlinda Mike, CNM Attending Physician:  Enid Cutter DD:  06/11/01 TD:  06/14/01 Job: [id number] [id number]

## 2010-06-06 NOTE — Discharge Summary (Signed)
[facility]  Patient:    Barbara Clark, Barbara Clark Visit Number: [id number] MRN: [id number]          Service Type: OBS Location: [phone] Attending Physician:  Tammi Sou Dictated by:   Ocie Doyne, M.D. Admit Date:  02/16/2001 Discharge Date: 02/16/2001   CC:         Barbara Clark, M.D.                           Discharge Summary  DATE OF BIRTH:                [birth year]  ADMISSION DIAGNOSES:          1. Insulin-dependent diabetes mellitus poorly                                  controlled.                               2. Hyperglycemia.                               3. Pregnancy at 17-5/[redacted] weeks gestation.  DISCHARGE DIAGNOSES:          1. Insulin-dependent diabetes mellitus poorly                                  controlled.                               2. Hyperglycemia.                               3. Pregnancy at 17-5/[redacted] weeks gestation.  DISCHARGE MEDICATIONS:        1. Insulin NPH 7 units at breakfast and 7 units                                  at bedtime.                               2. Insulin Humalog 10 units at breakfast, 10                                  units at lunch, and 10 units at dinner.                               3. Prenatal vitamins.  HISTORY OF PRESENT ILLNESS:   This 35 year old, gravida 2, para 1-0-0-1, at 12-5/[redacted] weeks gestation presented to the [facility] today.  She was complaining of nausea.  She was found to have a capillary blood glucose of 264.  She was therefore referred to [facility] for admission to check urine for ketones, serum ketones, electrolytes, CBC, and a 24-hour urine.  For details, please refer to admission history and physical as  dictated by Salvadore Dom, M.D.  HOSPITAL COURSE:  House officer called to the patients room at 8 p.m., the patient demanding to leave the hospital.  The patient was tearful.  She stated that her boyfriend who was supposed to  be caring for her 23 year old daughter was not returning her phone calls, although, he had told her he would be returning to the hospital to take care of her child.  She reports he has a history of leaving the child unattended while the patient has been hospitalized for complications related to her diabetes.  She reported that the father of the baby is not supportive of the pregnancy, has told her that he wishes she was not pregnant, and that she has no sources of emotional support. She is under considerable financial stressors and is currently unemployed.  I encouraged her to remain in the hospital overnight so that we could continue a 24-hour urine collection and review her labs that were collected today as well as titrating her insulin to optimize her glycemic control.  She declined to stay.  The risks that were explained to her included complications of the pregnancy secondary to uncontrolled diabetes as well as diabetic ketoacidosis due to the patients poor glycemic control.  The patient was urged to continue using her insulin as instructed by Lenor Coffin and to check fasting blood sugars before meals three times a day and at bedtime.  I strongly encouraged her to document these and bring these numbers to her next visit.  She is to contact the [facility] tomorrow morning for an appointment early next week. Additionally, I left a message with the [facility] that they will want to get in touch with [facility] and ensure that she is not lost to follow-up.  I am very concerned about her suboptimally controlled diabetes and have shared with her my concerns about the effects that her illness will have both on her unborn child and on her own health, however, she declines to stay in the hospital and has signed out AMA. Dictated by:   Ocie Doyne, M.D. Attending Physician:  Tammi Sou DD:  02/16/01 TD:  02/17/01 Job: [id number]

## 2010-06-06 NOTE — Discharge Summary (Signed)
NAMEMIDGE, MOMON NO.:  [id number]   MEDICAL RECORD NO.:  [id number]          PATIENT TYPE:  INP   LOCATION:  3025                         FACILITY:  [facility]   PHYSICIAN:  Altamese Cabal, M.D.  DATE OF BIRTH:  Jan 24, 1975   DATE OF ADMISSION:  01/08/2005  DATE OF DISCHARGE:  01/09/2005                                 DISCHARGE SUMMARY   DISCHARGE DIAGNOSES:  1.  Hyperglycemia.  2.  Dehydration.  3.  Nausea and vomiting.  4.  Depression.   DISCHARGE MEDICATIONS:  1.  NPH 36 units q.a.m., 18 units q.p.m.  2.  Regular insulin 12 units q.a.c.   ADMISSION LABORATORY:  Sodium 134, potassium 4, chloride 104, bicarb 20, BUN  9, creatinine 0.9, glucose 518.  Urine had a specific gravity of 1.037,  glucose greater than 1000, ketones 40, leukocyte esterase negative, nitrite  negative.   BRIEF HISTORY OF PRESENT ILLNESS:  This is a 35 year old African-American  female with a history of type 1 diabetes, who presented to the ED  complaining of diarrhea, dehydration, nausea and vomiting.  She had been out  of her Lantus for about two weeks, and had just been taking regular insulin  instead.  Then starting a week ago she began having nausea and vomiting, and  decreased p.o. intake.  On exam, did appear dehydrated and fatigued, but  otherwise normal exam.   PROCEDURES:  Chest x-ray negative for acute cardiopulmonary disease.   HOSPITAL COURSE BY PROBLEM:  1.  Hyperglycemia.  The patient was initially managed by the Glucommander      overnight and her sugars got down to below 200 in less than about four      hours.  However, once she was off the Glucommander, she did take p.o.      and her blood sugars immediately shot up to around 300 and she got a      large dose of regular insulin on top of half her usual Lantus dose and      her blood sugars plummeted to 34.  We had a discussion with the patient      regarding barriers to using insulin, and her main difficulty  was      affording the medication.  We therefore decided to start her on an NPH      regimen that be less than half the cost of her previous regimen of      Lantus and regular insulin.  The patient was set up with follow-up for      the following week.  She was instructed to measure her blood sugar at      least once every morning and record these readings for her primary care      physician.  2.  Dehydration.  The patient was resuscitated with IV fluids overnight, and      these were gradually decreased the following morning when she was taking      better p.o.  3.  Social.  We spoke with a social work Nurse, adult, who started to help      her  with the Medicaid application process.  We also got her funds from      [employer] for three days worth of medication.   DISCHARGE LABORATORY DATA:  BMP.  Sodium 137, potassium 3.7, chloride 110,  bicarb 24, glucose 136, BUN 4, creatinine 0.6.   DISCHARGE INSTRUCTIONS:  1.  She is to take all medications as prescribed.  2.  She was given a phone number to call regarding her Medicaid application.  3.  A follow-up appointment was set up at the [facility] for      her.  4.  She is to adhere to a diabetic diet, and monitor her blood sugars.      Altamese Cabal, M.D.     KS/MEDQ  D:  01/14/2005  T:  01/14/2005  Job:  [id number]

## 2010-06-06 NOTE — Discharge Summary (Signed)
[facility]  Patient:    Barbara Clark, Barbara Clark                        MRN: [id number] Adm. Date:  [id number] Disc. Date: [id number] Attending:  Valera Castle                           Discharge Summary  ADMITTING DIAGNOSES:          1. Primary herpes genital outbreak.                               2. Insulin-dependent diabetes mellitus,                                  uncontrolled.  DISCHARGE DIAGNOSES:          1. Primary herpes genital outbreak.                               2. Insulin-dependent diabetes mellitus,                                  uncontrolled.                               3. Improved after IV therapy and diabetic                                  management.  DISPOSITION:                  Discharged home in good condition.  REASON FOR ADMISSION:         A 35 year old G0 presents to [facility] triage with painful genital lesions and diabetes out of control. The patient states that she had an itchy feeling in the vaginal area for about a week, and noted increasing pain in the area. She reports having the same sexual partner for the last year and a half who also has small painful spots on his genital area. The patient is an insulin-dependent diabetic and is on insulin, Humalog, and Lantus LA, but she is not in very good control.  PAST MEDICAL HISTORY:         Surgery: None that is known. Illnesses: Diabetes, insulin-dependent, noncompliant.  MEDICATIONS:                  Insulin, Humalog, Lantus LA.  ALLERGIES:                    No known drug allergies.  PHYSICAL EXAMINATION:  GENERAL:                      Very tearful, emotional patient, upset about her diagnosis of genital herpes.  VITAL SIGNS:                  Temperature 99, pulse 88, respiratory rate 18, blood pressure 115/71.  HEENT:  Benign.  LUNGS:                        Clear to auscultation bilaterally.  HEART:                         Regular rate and rhythm without murmur, rub or gallop.  ABDOMEN:                      Soft, nontender, no palpable masses or organomegaly appreciated.  BACK:                         No CVA tenderness.  PELVIC:                       Labia and introitus and perirectal area revealed multiple erosions which were painful to palpation, some were crusting over and draining. Further pelvic exam, including the speculum and bimanual exam, were deferred because of the severe pain with primary herpes outbreak.  IMPRESSION:                   A 35 year old G0 with primary herpes simplex virus outbreak and uncontrolled insulin-dependent mellitus.  PLAN:                         Admit for IV acyclovir and pain control. Consult for diabetic management.  LABORATORY VALUES:            Urine: ketones greater than 80, glucose 500, urine specific gravity is 1.030. Herpes cultures were done of the genital lesions. Hepatitis screen was done. HIV screen was done. Urine pregnancy was negative. Wet prep revealed a few white blood cells, few yeast.  HOSPITAL COURSE:              The patient was admitted and responded well to medical therapy and was very little pain by hospital day #2, and was therefore discharged home with instructions.  DISCHARGE MEDICATIONS:        1. Continue diabetic medications as instructed.                               2. Valtrex 1000 mg twice a day for eight days.                               3. Diflucan 200 mg one pill daily for three                                  days.  DISCHARGE INSTRUCTIONS:       Instructions were given for sitz baths for primary herpes outbreak. Routine written instructions were given for diet and activity.  FOLLOWUP:                     The patient is to follow up in the OB/GYN Office for samples and follow up in the [facility] on February 05, 2000, and also follow up at the [facility], the outpatient internal medicine, on  February 05, 2000 for diabetes and general medical care. D:  05/19/00 TD:  05/19/00 Job: [id number]

## 2010-06-06 NOTE — Discharge Summary (Signed)
NAME:  Barbara Clark, Barbara Clark                         ACCOUNT NO.:  [id number]   MEDICAL RECORD NO.:  [id number]                   PATIENT TYPE:  INP   LOCATION:  9114                                 FACILITY:  [facility]   PHYSICIAN:  Conni Elliot, M.D.             DATE OF BIRTH:  Oct 22, 1975   DATE OF ADMISSION:  08/16/2002  DATE OF DISCHARGE:  08/18/2002                                 DISCHARGE SUMMARY   DISCHARGE DIAGNOSES:  1. Escherichia colic urinary tract infection, pansensitive.  2. Pregnancy at gestation of 36-3/7 weeks.  3. Diabetes mellitus type 1.   DISCHARGE MEDICATIONS:  1. Macrodantin 100 mg p.o. q.i.d. x7 days, then 100 mg p.o. q.d.  2. NPH insulin 62 units q.a.m., 42 units q.p.m.  3. Regular insulin 34 units b.i.d.  4. Colace/Milk of Magnesia as needed.  5. Prenatal vitamins one per day.   FOLLOW UP:  The patient is to follow up at high risk clinic next Wednesday,  August 4.   PROCEDURE:  Renal ultrasound on August 16, 2002, shows hydronephrosis of the  right kidney with moderate to marked pelvocaliectasis and bilateral ureteral  jets within the bladder, but no obstructing calculous.   HISTORY OF PRESENT ILLNESS:  Barbara Clark is a 35 year old, G3, P2 at 36-1/7  weeks who presented with pain and dysuria.  Urine culture from July 28, was  found to be positive for E. coli that was pansensitive.   HOSPITAL COURSE:  The patient did develop hematuria without evidence of  vaginal bleeding during her hospitalization which did resolve.  She took  Percocet for pain, developed no contractions, fever or vomiting, but did  have nausea.  She was treated with IV Cefotetan x2 days.  She had  difficulties with her diabetic control throughout her hospitalization with  result in increasing NPH dosing and requested discharge on hospital day #2  due to a feeling that the walls were closing in on her here.  She  understands the risks of early discharge, however, with her urine culture  results being back and showing pansensitivity, Dr. Perlie Gold and I feel that  it is safe to send her home.   DISCHARGE LABORATORY DATA AND X-RAY FINDINGS:  A1C 6.7.  Sodium 133,  potassium 4.0, chloride 104, CO2 22, BUN 6, creatinine 0.6.  Bilirubin 0.6,  Alk phos 119, AST 10, ALT 20, total protein 6.3, albumin 3.0, calcium 8.7.  WBC 10.2, hemoglobin 12.5, platelets 253.  Urine culture positive for  greater than 100,000 colonies of E. coli pansensitive.    DISPOSITION:  The patient is discharged to home on hospital day #2 and is to  follow up at the high-risk clinic next Wednesday.        Jonah Blue, M.D.                      Conni Elliot, M.D.    Milas Gain  D:  08/18/2002  T:  08/19/2002  Job:  [id number]

## 2010-06-06 NOTE — H&P (Signed)
[place]  Patient:    Barbara Clark, Barbara Clark                        MRN: [id number] Adm. Date:  [id number] Attending:  Sandi Raveling Dictator:   Andrey Spearman, M.D.                         History and Physical  CHIEF COMPLAINT:  Nausea, vomiting, and diarrhea.  HISTORY OF PRESENT ILLNESS:  This is [name] who reports a one-day history of initially a headache, which then progressed to a fever to approximately 100 degrees, then diarrhea, then vomiting.  She reports she has vomited x 2 today and she has had multiple episodes of diarrhea overnight.  She reports that she has an 33-year-old daughter who was sick with vomiting and diarrhea one week ago, which was her only sick contact.  She denies any blood in her stools or vomit.  She denies any cough or sore throat. The patient reports that she is diabetic and she last checked her CBG at approximately 11 a.m. this morning, and it was 317 at that time, so she took 15 units of Humalog.  She reports her sugars generally range from 55-460.  She also has been able to keep down some potato chips recently today, but that is the only thing she has really taken by mouth.  REVIEW OF SYSTEMS:  She denies any changes in her vision.  No chest pain, no shortness of breath, no abdominal pain, no dysuria or hematuria.  She has had bilateral ankle swelling, but she attributes this to her p.m. Lantus insulin. Of note, she was seen in an Urgent Care Clinic today, where she apparently then also had a sugar in the 340 range.  I am a little unclear whether she received any insulin at that time.  PAST MEDICAL HISTORY:  Insulin-dependent diabetes mellitus, diagnosed three years ago, with a history of ketoacidosis.  Hospitalized multiple times in the last year, although she does not have a regular doctor she follows up with.  CURRENT MEDICATIONS: 1. Lantus insulin 30 units q.p.m. 2. Humalog  sliding scale insulin starting at 3 units with CBG between    150-220 she thinks, although she is not sure.  ALLERGIES:  No known drug allergies.  SOCIAL HISTORY:  She smokes 1/2 pack a day and she has for two years. Occasional alcohol use.  She is single and lives with her 59-year-old daughter.   FAMILY HISTORY:  Only known family history is a grandmother with diabetes mellitus.  PHYSICAL EXAMINATION:  VITAL SIGNS:  Temperature 98.6 degrees, blood pressure 90/55, pulse 99, respirations 16, saturating 99% on room air.  GENERAL:  She is pleasant, in no acute distress.  HEENT:  She is normocephalic, atraumatic.  Pupils equal, round, reactive to light.  Throat is clear.  Mucous membranes are dry.  NECK:  Shows shotty anterior and posterior cervical lymphadenopathy.  No thyromegaly.  HEART:  A regular rate and rhythm.  No murmurs, gallops, or rubs.  LUNGS:  Clear to auscultation bilaterally.  ABDOMEN:  Soft, nondistended, with good bowel sounds.  Slightly diffusely tender to palpation.  EXTREMITIES:  No cyanosis, clubbing, or edema.  NEUROLOGIC:  Strength 5/5 and symmetric with a normal gait.  LABORATORY DATA:  A CBC, BMP, urinalysis, with culture and hemoglobin A1C pending.  ASSESSMENT/PLAN:  This is a 35 year old with insulin-dependent diabetes  mellitus, with nausea, vomiting, diarrhea, and dehydration.  PLAN: 1. Insulin-dependent diabetes mellitus:  Will check her CBGs q.a.c., q.h.s.,    and q.3 a.m.  Will cover with sliding scale insulin, in addition to    her regular Lantus dose.  The patient is unlikely to be in ketoacidosis    but will give liberal fluids and watch her potassium.  Will check her    urinalysis for ketones and a BMP for her bicarbonate.  Will likely need    to adjust her insulin with Regular, to cover meals, but will need to    follow this as an outpatient. 2. GASTROINTESTINAL:  Will go ahead and check a CBC and urinalysis.    Suspect this is a  viral gastroenteritis.  I see no further workup    necessary at this time. DD:  03/11/00 TD:  03/12/00 Job: [id number]

## 2010-06-06 NOTE — Discharge Summary (Signed)
[facility]  Patient:    Barbara Clark, Barbara Clark                        MRN: [id number] Adm. Date:  [id number] Disc. Date: [id number] Attending:  Beverely Low                           Discharge Summary  HISTORY/HOSPITAL COURSE:  The patient is a 35 year old female who is a known diabetic who presented to the office and was found to have nausea and vomiting.  She refused admission at that time, came back a day later, was admitted this time at [facility] with no beds at present, she was transferred over to [facility].  She was admitted to the floor.  We had difficulty in maintaining the patients blood pressure.  She certainly was somewhat dehydrated from her episode of nausea and vomiting, but we did ask for a critical care consultation who felt that her DKA had been resolved and suggested that we continue with the problems that we had started.  The patient complained also of abdominal discomfort.  We had a consultation by Dr. Wiliam Ke who did not feel she had an acute abdomen.  At the time of her hospitalization, we felt that she possibly had a PID and she was treated for that and it is of note that she did get better on that treatment.  The patients main problem though, is that she has not been compliant on her diabetic care.  She has seen Dr. Margaretmary Bayley on an outpatient basis.  We also received a consultation from Dr. Chestine Spore who suggested that we continue her on the Lantus and fractional insulin regime, and that he will see her on an outpatient basis.  The patient continued to show improvement hemodynamically. Her blood pressure became stable and it was felt that she had benefited as much as she could from this hospitalization and that discharge was appropriate.  DISCHARGE DIAGNOSES: 1. Nausea and vomiting. 2. Questionable diabetic ketoacidosis. 3. Pelvic inflammatory disease. 4. Hypotension which has been corrected.  FOLLOW-UP:  She  will be followed up on an outpatient basis in our office and she will also be followed by the endocrinologist.  We have had a long talk with this patient and she understands how important this is.  She is to restart her medications. DD:  12/04/99 TD:  12/04/99 Job: [id number]

## 2010-06-06 NOTE — Discharge Summary (Signed)
[place]  Patient:    Barbara Clark, Barbara Clark Visit Number: [id number] MRN: [id number]          Service Type: MED Location: [id number] [id number] Attending Physician:  McDiarmid, Leighton Roach. Dictated by:   Harrold Donath, M.D. Admit Date:  09/29/2000 Discharge Date: 09/30/2000   CC:         Andrey Spearman   Discharge Summary  INCOMPLETE  DIAGNOSES: 1. Diabetic ketoacidosis. 2. Cervicitis. 3. Urinary tract infection.  MEDICATIONS: 1. Insulin 10 units subcu q.a.c. lunch, breakfast, and dinner. 2. Lantus 35 units subcu q.h.s. 3. Zoloft 50 mg q.d. 4. Ortho Tri-Cyclen q.d.  ADMISSION HISTORY:  The patient is a 35 year old female Dictated by:   Harrold Donath, M.D. Attending Physician:  McDiarmid, Tawanna Cooler D. DD:  10/01/00 TD:  10/01/00 Job: [id number]

## 2010-06-06 NOTE — H&P (Signed)
. [facility]  Patient:    Barbara Clark, Barbara Clark Visit Number: [id number] MRN: [id number]          Service Type: MED Location: [phone] Attending Physician:  Doneta Public Dictated by:   Ebbie Ridge, M.D. Admit Date:  11/13/2000   CC:         Andrey Spearman   History and Physical  CHIEF COMPLAINT:  Vomiting.  HISTORY OF PRESENT ILLNESS:  [name] is a 34 year old African-American female with a past medical history significant for type 1 diabetes mellitus and multiple hospitalization for DKA.  [name] presented to the emergency room with a 24-hour history of vomiting and fever.  She is unable to tolerate anything orally.  She said that she did not take her insulin dose the previous day because she was sick.  She had been exposed to two children with a viral illness for the past week.  Then, yesterday, she started running fever, having cough, and runny nose.  She began vomiting yesterday morning and continued throughout the day.  She also complains of back pain.  She denies dysuria, hematuria, polyuria, diarrhea, or abdominal pain.  Glucose on admission is 554.  A pH 7.059 and bicarb 8.  PAST MEDICAL HISTORY:  Type 1 diabetes mellitus diagnosed four years ago. History of tobacco abuse.  History of migraine headaches.  MEDICATIONS: 1. Humulin 10 units at lunch and dinner. 2. Lantus 35 units q.p.m. 3. Aspirin 325 mg q.d. 4. Ortho Tri-cyclen.  ALLERGIES:  No known drug allergies.  SOCIAL HISTORY:  The patient has one daughter, age 76 years old.  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  She lives alone.  She does use tobacco.  She also occasionally uses alcohol.  She denies any drugs.  FAMILY HISTORY:  The patient has a grandmother with diabetes mellitus.  She is unsure of the rest of her family history because she was a foster child.  REVIEW OF SYSTEMS:  The patient states she may have a yeast infection because she has  had thick white discharge.  She denies any genital lesions or yellow discharge.  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  PHYSICAL EXAMINATION:  VITAL SIGNS:  Temperature 97.5, blood pressure 99/60, pulse 123, respiratory rate 22.  Pulse oximetry 99% on room air.  GENERAL:  This is a very thin, ill-appearing black female in moderate distress.  She is anxious and begging for food.  HEENT:  Pupils are equal, round and reactive to light and accommodation. Extraocular movements are intact.  Mucus membranes moist are dry and cracked.  NECK:  Palpable with no thyromegaly.  No lymphadenopathy.  LUNGS:  Clear to auscultation bilaterally without wheezes, rales, or rhonchi.  CARDIOVASCULAR:  Tachycardic with a regular rhythm.  No murmurs, rubs, or gallops.  MUSCULOSKELETAL:  No CVA tenderness.  No paraspinous tenderness.  ABDOMEN:  Soft, nontender, and nondistended.  Normoactive bowel sounds.  SKIN:  Dry.  There are two annular scalene patches on the right arm and left shoulder.  NEUROLOGICAL:  Cranial nerves II-XII are intact.  The patient is alert and oriented x 3.  No focal deficits.  LABORATORY DATA:  A pH of 7.059, pCO2 29, bicarb 8, sodium 131, potassium 5.1, chloride 97, bicarb 8, BUN 24, creatinine 1.9, glucose 554, calcium 9.8. Total bilirubin 2.3, alkaline phosphatase 121, AST 23, ALT 19, total protein 8.7,  albumin 4.6.  Urine drug screen is negative.  Blood alcohol level is negative.  Urine pregnancy test is negative.  Urinalysis reveals specific gravity of 1.027 with greater than 1000 glucose, greater than 80 ketones, small blood, 100 protein, but no leukocyte esterase, nitrites, or bacteria. White blood count is 17.8 with 90% neutrophils, 6% lymphocytes.  Hemoglobin is 15.2 and platelets are 322,000.  ASSESSMENT:  [name] is [name] with type 1 diabetes  mellitus and frequent episodes of diabetic ketoacidosis who is being admitted for diabetic ketoacidosis.  We will admit to step-down unit and rehydrate with intravenous fluids aggressively and we will also start an insulin drip.  We will check blood cultures, urine cultures, and chest x-ray to rule out infectious sources for diabetic ketoacidosis.  However, the patients history is consistent with a viral upper respiratory infection as the initiating factor.  Once the patients acidosis resolves, we will change to subcutaneous insulin and regular diet.  During this admission, the patient would benefit from instruction and education regarding sick day management of her insulin regimen.  We will also treat her tinea corpus with clotrimazole cream t.i.d.  Acute renal failure with creatinine is likely prerenal.  Watch for resolution after hydration. Dictated by:   Ebbie Ridge, M.D. Attending Physician:  Doneta Public DD:  11/13/00 TD:  11/14/00 Job: [id number]

## 2010-06-06 NOTE — Discharge Summary (Signed)
[place]  Patient:    Barbara Clark, Barbara Clark Visit Number: [id number] MRN: [id number]          Service Type: MED Location: [phone] Attending Physician:  Doneta Public Dictated by:   Jonah Blue, M.D. Admit Date:  11/13/2000 Discharge Date: 11/16/2000   CC:         Barbara Clark, M.D.   Discharge Summary  DISCHARGE DIAGNOSES: 1. Diabetes mellitus type 1 with diabetic ketoacidosis. 2. Bacterial vaginosis. 3. Very poor social situation. 4. Severe depression. 5. Tobacco abuse.  DISCHARGE MEDICATIONS: 1. Effexor XR 75 mg p.o. q.d. 2. Lantus insulin 35 units subcutaneous q.p.m. 3. Metronidazole 500 mg p.o. b.i.d. x1 week.  FOLLOW-UP:  The patient has an appointment to see Dr. Lenard Galloway on Friday, November 1, at 3:55 in the afternoon.  PROCEDURES/DIAGNOSTIC STUDIES:  The patient had a chest x-ray on October 26, which showed no active disease.  CONSULTANTS:  Social work and diabetes Programmer, systems.  ADMISSION HISTORY AND PHYSICAL:  The patient is a 36 year old African-American female with past medical history significant for type 1 diabetes mellitus with multiple hospitalizations for DKA who presents to the hospital to the ED with a 24-hour history of vomiting and fever, unable to keep p.o. down.  She missed her insulin dose last night secondary to being sick.  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  She also complains of back pain, denies dysuria, hematuria, diarrhea.  She has has had positive cough and runny nose yesterday and she also thinks she has a yeast infection.  PHYSICAL EXAMINATION:  GENERAL:  The patient was tachycardic, was ill-appearing, in moderate distress, was anxious and begging for food.  HEENT:  She had dry mucous membranes, sweet-smelling breath.  CARDIAC:  Hyperdynamic precordium which is tachycardic with a regular rhythm.  ABDOMEN:  No  CVAT.  EXTREMITIES:  No edema of extremities.  LABORATORIES:  On admission:  WBC 17.8, 90% neutrophils, BMET showed a bicarb of 8, and a glucose of 554.  Blood alcohol level was negative.  VDS was negative.  Small serum ketones.  Urine pregnancy test was negative.  UA showed greater than 1000 glucose, greater than 80 ketones, small blood, 100 proteins, but negative for LE, nitrites and bacteria.  The pH was 7.059, PCO2 29, and bicarb was 8.  The patient was admitted to the step-down unit for aggressive IV hydration and an insulin drip as well as diabetes education.  HOSPITAL COURSE: #1 - DIABETES:  The patient was maintained on the insulin drip with q.1h. CBGs for her entire first day of admission.  By the next morning, she was ready to go off the insulin drip with subcutaneous Humalog.  The patient was due for her Lantus insulin dose that p.m., so she was given Ultralente in the morning to last until the Lantus would start to work.  The patient did have some elevated blood sugars on the Lantus and somewhat sporadic blood sugars on the Lantus.  She would benefit from a three-shot a day Humalog regimen with meals. However, in this noncompliant patient, it seems unlikely that she will be able to comply with such a complicated regimen and therefore ongoing Lantus is probably her best option.  The patient was asked to keep a record of her blood sugars and present it to Dr. Lenard Galloway at their follow-up appointment on Friday. The patient notes that the reason that she does not take her  insulin is because she does not eat, because she cannot afford food.  Diabetic education did instruct the patient about the importance of eating and of even giving sick day insulin regardless of whether she eats or not.  #2 - SOCIAL:  The patient cannot afford food.  She does not have power in her house currently.  Housing authority does supplement her rent and if they find out, then the patient can lose her  housing authority assistance and would be homeless.  Additionally, she has a 59 year old daughter who is in the room with her who is supposed to be going on a field trip to the [employer] the next morning after the patient was admitted.  The child had no clean clothing and no way to get home to get any clothes or to get to the field trip.  I contacted her teacher who came in, picked up the child, took her home, gathered the clothing, took them home and did laundry for her, brought the child back and then picked up the child for the trip the next morning.  So, the patients daughter is on the trip at the time of discharge.  The patient also was connected with social services during her admission and with the social worker at the school that the daughter attends, a number of resources were made available to this patient.  The social worker, Norvel Richards, is going to take [name], this morning, to DSS to try to obtain emergency funding for her power bill and to help her get food.  Hopefully, by the time the little girl returns, the social situation will be more in control.  This should be followed up on by Dr. Lenard Galloway at follow-up as well.  #3 - DEPRESSION:  The patient was severely depressed during her admission. She does state that she has had suicidality of late due to overall feelings of worthlessness and yet she has made no attempts to try to injure herself.  Her last suicidal ideation was three days prior to admission.  She does contract for safety.  She notes that she has been on Zoloft and Paxil in the past and they caused her to be increasingly anxious.  The patient was started on Effexor 37.5 mg p.o. b.i.d. which was given for the first three days of the hospitalization.  At discharge, the patient was given a prescription for Effexor XR 75 mg p.o. q.d. and recommended to follow up with mental health.  #4 - SEXUALLY TRANSMITTED DISEASES:  The patient notes some risky  behaviors  regarding STDs.  She was checked with a wet prep for GC and chlamydia and for HIV.  She was negative for everything for BV and was treated consequently with metronidazole 500 mg p.o. b.i.d. x7 days.  Of note, the patient did come in with reported alcohol use prior to admission, per her daughter.  The patient was strongly encouraged to avoid alcohol and drugs, but especially while taking metronidazole.  #5 - TOBACCO ABUSE:  The patient does not seem to be a good candidate for smoking cessation information at this time, given her number of other issues, but this should be recommended to her as an outpatient.  DISCHARGE LABORATORIES:  ABG on October 26, pH 7.361, PCO2 32.2, PO2 107.0, bicarb 19.5, saturating 97.8% on room air.  CBC on October 27, shows WBC 16.6, hemoglobin 11.0, and platelets 264.  This white count is down from a maximum of 17.8 on the 26th.  Chemistries on October  27, show sodium 138, potassium 3.8, chloride 106, bicarb 27, BUN 3, creatinine 0.6, and glucose 120.  AST 17, ALT 16, alkaline phosphatase 94, bilirubin 2.1, calcium 8.3.  UA on October 26, negative for nitrites, 0-2 rbcs, 100 protein, glucose greater than 1000, small hemoglobin, bilirubin negative, ketones greater than 80. Urine culture on November 13, 2000, shows multiple species present with no uropathogens isolated, likely contamination.  HIV test on October 27, negative.  GC and chlamydia tests on October 27, negative.  Wet prep on October 27 shows few clue cells and moderate WBCs.  Blood acetone on October 26 was small.  Alcohol level on October 26 was less than 5.  Urine drug screen on October 26 was negative.  Urine pregnancy test on October 26 was also negative.  The patient was discharged to home with close follow-up recommended and hopefully adequate recommendations for various social services support available. Dictated by:   Jonah Blue, M.D. Attending Physician:  Doneta Public DD:  11/16/00 TD:  11/17/00 Job: [id number]

## 2010-06-06 NOTE — Discharge Summary (Signed)
[place]  Patient:    Barbara Clark, Barbara Clark                        MRN: [id number] Adm. Date:  [id number] Disc. Date: [id number] Attending:  Doneta Public Dictator:   Andrey Spearman, M.D.                           Discharge Summary  DISCHARGE DIAGNOSES: 1. Gastroenteritis. 2. Dehydration. 3. Insulin-dependent diabetes mellitus.  DISCHARGE MEDICATIONS: 1. Lantus 35 units p.m. q.d. 2. Sliding scale 150-200 3 units, 201-250 5 units, 251-300 7 units, 301-350 9    units, 351-400 11 units.  BRIEF ADMISSION HISTORY:  [name] with history of IDDM for three years comes in with diarrhea x 2 days, vomiting x 1 day, slight fever of 100 degrees, no cough, no sore throat.  The patients daughter also had similar symptoms last week.  The patient complained of abdominal pain upon vomiting.  She was seen earlier at urgent care with blood sugar of 340. On admission to the hospital, the patients blood sugar was 380.  At the time the patient was able to keep liquids down.  The patient did not complain of any other symptoms.  PERTINENT PHYSICAL EXAMINATION ON ADMISSION:  VITAL SIGNS:  Pulse 99, temperature 98.6, blood pressure 90/55, respiratory rate 16, saturating on room air at 99%.  HEENT:  Pale mucous membranes.  ABDOMEN:  Slightly diffuse tenderness to palpation.  No other positive findings were found on physical exam.  PERTINENT LABORATORY DATA:  Blood sugar of 380, a potassium of 5.5, a bicarb of 23.  UA showed + glucose and 15 ketones.  Hemoglobin A1C was 13.  HOSPITAL COURSE: #1 - VIRAL GASTROENTERITIS:  The patient was rehydrated with normal saline 500 cc bolus followed by 150 cc/h.  The patient immediately felt better.  The patients diarrhea and vomiting resolved, and the patient remained afebrile throughout hospital stay.  The patient was told to keep herself well hydrated when discharge.  #2 -  INSULIN-DEPENDENT DIABETES MELLITUS:  The patient has three-year history of IDDM.  The patient is not followed by anyone for this condition.  The patients blood sugar at home is not well controlled, given her hemoglobin of hemoglobin A1C and history of past admissions for DKA.  Her blood sugars were increased upon admission probably secondary to gastroenteritis.  Lantus 30 units and sliding scale are started.  The patients blood sugars began to normalize and currently are stable for discharge.  She should follow up in family practice clinic to better control her diabetes.  #3 - SMOKING HABIT:  Given the patients age and history of diabetes, the patient was advised to stop smoking.  This should also be discussed again with the patient when seen in family practice clinic.  CONDITION ON DISCHARGE:  Stable.  DISCHARGE FOLLOWUP:  Dr. Lenard Galloway on March 22, 2000. DD:  03/12/00 TD:  03/15/00 Job: [id number]

## 2010-06-06 NOTE — Discharge Summary (Signed)
NAMEKAMIA, INSALACO NO.:  [id number]   MEDICAL RECORD NO.:  [id number]                   PATIENT TYPE:  INP   LOCATION:  9128                                 FACILITY:  [facility]   PHYSICIAN:  Billey Gosling, M.D.                 DATE OF BIRTH:  Feb 25, 1975   DATE OF ADMISSION:  01/20/2002  DATE OF DISCHARGE:  01/25/2002                                 DISCHARGE SUMMARY   DISCHARGE DIAGNOSES:  1. Intrauterine pregnancy at 7 weeks and 1 day gestational age.  2. Diabetes mellitus type 1 poorly controlled.  3. Candidal vaginitis.   DISCHARGE MEDICATIONS:  1. Prenatal vitamins q.d.  2. Novolin insulin NPH 26 units q.a.m. and 15 units q.h.s.  3. Novolin regular insulin 17 units q.a.m. and 15 units with dinner.   DISPOSITION:  The patient was discharged to home with instructions to eat  three meals per day with two snacks and bring food diary to [facility] as well as record blood sugars fasting  before lunch, before dinner and at bedtime and call the OB/GYN office in  order for Dr. Gavin Potters to adjust her insulin doses.   FOLLOW UP:  She is to [facility]  tom[facility], January 26, 2002, as well as February 02, 2002, [facility].  She is also to follow up with her previously scheduled appointment  at the [facility] on February 01, 2002.   HOSPITAL COURSE:  This 35 year old African American female is a gravida 3,  para 2-0-0-2 who presented at 6 weeks and 2 days gestational days by  ultrasound and admitted for poorly controlled type 1 diabetes mellitus.  Her  last hemoglobin A1C per Dr. Ophelia Charter of the [facility] was 10.1 and  her average blood sugars had been in the 300 over the past three to four  weeks.  She was admitted for insulin titration and throughout  hospitalization, she was discontinued on the Lantus and started on Novolin  NPH as well as Novolin regular  twice a day.  Her sugars became better  controlled and on the day of discharge her sugars have been in the range of  78 to 160.  The patient expressed a strong desire to be discharged and  manage her insulins at home so arrangements have been made for follow-up and  for her to call the OB/GYN office for Dr. Gavin Potters to adjust her insulin  doses according to the blood sugar she writes down.  It is also stressed to  this patient how important this period of gestation is for organ growth and  for her to have very good blood sugar control with her insulin.  She  appeared to understand and did not have any further questions prior to  discharge.  Billey Gosling, M.D.    AS/MEDQ  D:  01/25/2002  T:  01/25/2002  Job:  [id number]

## 2010-06-06 NOTE — Discharge Summary (Signed)
[place]  Patient:    Barbara Clark, Barbara Clark                        MRN: [id number] Adm. Date:  [id number] Disc. Date: [id number] Attending:  Leilani Able D.                           Discharge Summary  DATE OF BIRTH:  1975/05/20  ADMISSION DIAGNOSES: 1. Diabetic ketoacidosis. 2. Type 1 diabetes.  DISCHARGE DIAGNOSIS:  Type 1 diabetes.  CONSULTANTS:  Lindell Spar. Chestine Spore, M.D.  HISTORY OF PRESENT ILLNESS:  This patient is [name] who presented on August 24, 1999, in moderately severe DKA.  She had been throwing up for several days and had become dehydrated and unable to keep any food down orally.  However, on presentation, she was not septic.  HOSPITAL COURSE:  The patient was sent to the unit and treated for her diabetic ketoacidosis for which she remained in the unit approximately three days.  Upon her transfer to the normal floor, her blood sugars had stabilized and her DKA had resolved.  While on the regular floor, the patients blood sugars continued to fluctuate, but in ranges less than 300 mg/dl.  Lindell Spar. Chestine Spore, M.D., was consulted to co-manage this patient.  Ever since the patient was started on Humalog and Lantus, her blood sugars have been relatively stable and there have been no further signs or symptoms of DKA.  Today the patient feels well and is considered stable.  In addition, her electrolytes are all stable.  DISPOSITION:  She will be discharged home today.  DIET:  The patient will remain on a 2000 calorie ADA diet at home.  DISCHARGE MEDICATIONS: 1. K-Dur 20 mEq one p.o. q.d. x 4 additional days. 2. Os-Cal 500 mg one p.o. q.d. and will discontinue in the outpatient setting.  ACTIVITY:  She is to resume her regular activities.  FOLLOW-UP:  She is to follow up with Betti D. Pecola Leisure, M.D., in one week.  She is also to follow up with Lindell Spar. Chestine Spore, M.D., in two weeks. DD:  08/29/99 TD:  08/31/99 Job:  [id number]

## 2010-06-06 NOTE — Discharge Summary (Signed)
[place]. [facility]  Patient:    Barbara Clark, Barbara Clark Visit Number: [id number] MRN: [id number]          Service Type: MED Location: [phone] Attending Physician:  McDiarmid, Leighton Roach. Dictated by:   Harrold Donath, M.D. Admit Date:  09/29/2000 Discharge Date: 09/30/2000   CC:         Andrey Spearman   Discharge Summary  DISCHARGE DIAGNOSES: 1. Diabetic ketoacidosis. 2. Cervicitis. 3. Urinary tract infection.  DISCHARGE MEDICATIONS: 1. Regular insulin 10 units subcu q.a.c. breakfast, lunch, and dinner. 2. Lantus 35 units subcu q.h.s. 3. Zoloft 50 mg q.d. 4. Ortho Tri-Cyclen q.d.  ADMISSION HISTORY:  The patient is [name] who presented to the emergency department after feeling sick for two days.  She states she had been taking her insulin but she states her refrigerator was not working properly and her insulin had crystallized.  She complained of polyuria and decreased p.o. and decreased weight over the last two weeks.  On admission, she was noted to have a glucose of 450.  PROBLEMS DURING ADMISSION: 1. Diabetic ketoacidosis.  On admission, the patients vitals were normal    except for a heart rate of 127.  She was sleepy but responsive.  She had    dry mucous membranes.  She had a white blood cell count of 18.5, a glucose    of 450, a bicarb of 10.  She had small ketones in her blood and anion gap    of 28.  Corrected sodium of 131.  Otherwise, her labs were relatively    normal.  She was given IV fluids and placed on a Glucommander.  Her anion    closed that afternoon of admission.  That evening, she began taking good    p.o. and her insulin drip was discontinued and she was placed on her home    regimen of Lantus and regular insulin after meals and covered with a    sliding scale.  The patient did well overnight and was discharged on those    medications the next morning.  This admission for DKA was likely due to  UTI    and cervicitis.  Please see next numbers for more information. 2. Urinary tract infection.  The patient was noted to have increased white    blood cell count and leukocytes esterase with bacteria on urinalysis.  She    was given a one-time dose of Suprax. 3. Cervicitis.  The patient was noted to have a yellow malodorous discharge on    pelvic exam.  She was given a one-time dose of Zithromax and Flagyl to    cover for Trichomonas and any possible GC or chlamydia.  The patient had no    further complaints throughout her hospital stay.  CONDITION ON DISCHARGE:  The patient was discharged to home in stable condition.  INSTRUCTIONS GIVEN TO THE PATIENT:  The patient was informed of her medical regimen and her primary care physician discussed the need for a possible SSRI. The patient agreed with this and Zoloft was added to her medical regimen.  She was informed of her appointment with Dr. Lenard Galloway on October 2 at 2 p.m. at the family practice center.  She was also told to watch her sugar intake and limit her calories to 2000 a day. Dictated by:   Harrold Donath, M.D. Attending Physician:  McDiarmid, Tawanna Cooler D. DD:  10/01/00 TD:  10/01/00 Job: [id number]

## 2010-07-28 ENCOUNTER — Encounter: Payer: Self-pay | Admitting: Family Medicine

## 2010-08-02 ENCOUNTER — Inpatient Hospital Stay (INDEPENDENT_AMBULATORY_CARE_PROVIDER_SITE_OTHER)
Admission: RE | Admit: 2010-08-02 | Discharge: 2010-08-02 | Disposition: A | Discharge status: Home / Self Care | Payer: Medicaid Other | Source: Ambulatory Visit | Attending: Emergency Medicine | Admitting: Emergency Medicine

## 2010-08-02 DIAGNOSIS — E109 Type 1 diabetes mellitus without complications: Secondary | ICD-10-CM

## 2010-08-02 DIAGNOSIS — B009 Herpesviral infection, unspecified: Secondary | ICD-10-CM

## 2010-08-06 LAB — HERPES SIMPLEX VIRUS CULTURE: Culture: DETECTED

## 2010-08-19 ENCOUNTER — Other Ambulatory Visit (HOSPITAL_COMMUNITY)
Admission: RE | Admit: 2010-08-19 | Discharge: 2010-08-19 | Disposition: A | Discharge status: Home / Self Care | Payer: Medicaid Other | Source: Ambulatory Visit | Attending: Family Medicine | Admitting: Family Medicine

## 2010-08-19 ENCOUNTER — Ambulatory Visit (INDEPENDENT_AMBULATORY_CARE_PROVIDER_SITE_OTHER): Payer: Medicaid Other | Admitting: Family Medicine

## 2010-08-19 ENCOUNTER — Encounter: Payer: Self-pay | Admitting: Family Medicine

## 2010-08-19 VITALS — BP 109/80 | HR 89 | Temp 98.7°F

## 2010-08-19 DIAGNOSIS — N760 Acute vaginitis: Secondary | ICD-10-CM

## 2010-08-19 DIAGNOSIS — A599 Trichomoniasis, unspecified: Secondary | ICD-10-CM | POA: Insufficient documentation

## 2010-08-19 DIAGNOSIS — Z01419 Encounter for gynecological examination (general) (routine) without abnormal findings: Secondary | ICD-10-CM | POA: Insufficient documentation

## 2010-08-19 DIAGNOSIS — Z202 Contact with and (suspected) exposure to infections with a predominantly sexual mode of transmission: Secondary | ICD-10-CM

## 2010-08-19 DIAGNOSIS — F339 Major depressive disorder, recurrent, unspecified: Secondary | ICD-10-CM

## 2010-08-19 DIAGNOSIS — E109 Type 1 diabetes mellitus without complications: Secondary | ICD-10-CM

## 2010-08-19 DIAGNOSIS — Z124 Encounter for screening for malignant neoplasm of cervix: Secondary | ICD-10-CM

## 2010-08-19 LAB — CBC
HCT: 36.8 % (ref 36.0–46.0)
Hemoglobin: 11.8 g/dL — ABNORMAL LOW (ref 12.0–15.0)
MCH: 30.6 pg (ref 26.0–34.0)
MCHC: 32.1 g/dL (ref 30.0–36.0)
MCV: 95.3 fL (ref 78.0–100.0)
Platelets: 424 10*3/uL — ABNORMAL HIGH (ref 150–400)
RBC: 3.86 MIL/uL — ABNORMAL LOW (ref 3.87–5.11)
RDW: 13.8 % (ref 11.5–15.5)
WBC: 7.6 10*3/uL (ref 4.0–10.5)

## 2010-08-19 LAB — COMPREHENSIVE METABOLIC PANEL
ALT: <8 U/L (ref 0–35)
AST: 10 U/L (ref 0–37)
Albumin: 4.3 g/dL (ref 3.5–5.2)
Alkaline Phosphatase: 113 U/L (ref 39–117)
BUN: 10 mg/dL (ref 6–23)
CO2: 27 mEq/L (ref 19–32)
Calcium: 9.4 mg/dL (ref 8.4–10.5)
Chloride: 102 mEq/L (ref 96–112)
Creat: 0.66 mg/dL (ref 0.50–1.10)
Glucose, Bld: 60 mg/dL — ABNORMAL LOW (ref 70–99)
Potassium: 4.4 mEq/L (ref 3.5–5.3)
Sodium: 138 mEq/L (ref 135–145)
Total Bilirubin: 0.2 mg/dL — ABNORMAL LOW (ref 0.3–1.2)
Total Protein: 7.4 g/dL (ref 6.0–8.3)

## 2010-08-19 LAB — POCT WET PREP (WET MOUNT): Yeast Wet Prep HPF POC: NEGATIVE

## 2010-08-19 LAB — POCT UA - MICROALBUMIN
Albumin/Creatinine Ratio, Urine, POC: <30
Creatinine, POC: 100 mg/dL
Microalbumin Ur, POC: 10 mg/dL

## 2010-08-19 LAB — LIPID PANEL
Cholesterol: 148 mg/dL (ref 0–200)
HDL: 51 mg/dL (ref >39)
LDL Cholesterol: 89 mg/dL (ref 0–99)
Total CHOL/HDL Ratio: 2.9 Ratio
Triglycerides: 40 mg/dL (ref <150)
VLDL: 8 mg/dL (ref 0–40)

## 2010-08-19 LAB — TSH: TSH: 2.355 u[IU]/mL (ref 0.350–4.500)

## 2010-08-19 LAB — POCT GLYCOSYLATED HEMOGLOBIN (HGB A1C): Hemoglobin A1C: 8.3

## 2010-08-19 MED ORDER — INSULIN GLARGINE 100 UNIT/ML ~~LOC~~ SOLN: 45.0000 [IU] | Freq: Every day | SUBCUTANEOUS | Status: DC

## 2010-08-19 MED ORDER — INSULIN REGULAR HUMAN 100 UNIT/ML IJ SOLN: 12.0000 [IU] | Freq: Three times a day (TID) | INTRAMUSCULAR | Status: DC

## 2010-08-19 MED ORDER — METRONIDAZOLE 500 MG PO TABS
ORAL_TABLET | ORAL | Status: AC
Start: 2010-08-19 — End: 2010-08-20

## 2010-08-19 NOTE — Progress Notes (Signed)
  Subjective:    Patient ID: Barbara Clark, female    DOB: 24-Dec-1975, 35 y.o.   MRN: [id number]  HPI Has not been seen in over 2 years - May 2010  Annual Gynecological Exam  [id number] Wt Readings from Last 3 Encounters:  05/31/08 169 lb 12.8 oz (77.021 kg)  05/18/08 174 lb 3.2 oz (79.017 kg)  05/15/08 171 lb 9.6 oz (77.837 kg)   Last period: 08/05/2010 Regular periods: yes Heavy bleeding: no  Sexually active: yes Birth control or hormonal therapy: No Hx of STD: Patient desires STD screening Dyspareunia: No Hot flashes: No Vaginal discharge: fishy odor   Dysuria:No  Last mammogram: 20's, normal- she said it was for screening Breast mass or concerns: No Last Pap: years ago History of abnormal pap: N  FH of breast, uterine, ovarian, colon cancer: NO  DIABETES- TYPE 1  Taking and tolerating: yes- has been getting lantus from a friend, does not have a meter Fasting blood sugars: unknown Hypoglycemic symptoms: no Visual problems: no Monitoring feet: yes Numbness/Tingling: no Last eye exam: 1 year ago per patient Diabetic Labs:  Lab Results  Component Value Date   HGBA1C 9.0 05/15/2008   HGBA1C 8.7 02/28/2008   HGBA1C 9.5 04/20/2007   Lab Results  Component Value Date   CREATININE 0.7 12/09/2009   Last microalbumin: No results found for this basename: MICROALBUR, [id number]         Review of Systems Gen:  No fever, chills, unexplained weight loss Ears:  No hearing loss, ringing Eyes: No vision changes, double vision, eye drainage Nose:  No rhinorrhea, congestion Throat:  No sore throat or dysphagia CV:  No chest pain, palpitations, PND, dyspnea on exertion, or edema Resp: No cough, dyspnea, wheezing Abd: No nausea, vomting, diarrhea, constipation, or change in bowel color, size, or caliber. MSK: no joint pain, myalgias GU: No dysuria, hematuria       Objective:   Physical Exam GEN: Alert & Oriented, No acute distress CV:  Regular Rate & Rhythm, no  murmur Respiratory:  Normal work of breathing, CTAB Abd:  + BS, soft, no tenderness to palpation Ext: no pre-tibial edema Pelvic Exam:        External: normal female genitalia without lesions or masses        Vagina: normal without lesions or masses        Cervix: normal without lesions or masses        Adnexa: normal bimanual exam without masses or fullness        Uterus: normal by palpation        Pap smear: performed        Samples for Wet prep, GC/Chlamydia obtained        Assessment & Plan:   No problem-specific assessment & plan notes found for this encounter.

## 2010-08-19 NOTE — Patient Instructions (Addendum)
Your a1c is 8.3%.  Goal is less than 7 Follow-up in 2-3 weeks.  We will discuss your labs and your diabetes in more detail Bring your glucose log to your appointment

## 2010-08-20 LAB — RPR

## 2010-08-20 LAB — GC/CHLAMYDIA PROBE AMP, GENITAL
Chlamydia, DNA Probe: NEGATIVE
GC Probe Amp, Genital: NEGATIVE

## 2010-08-20 LAB — HIV ANTIBODY (ROUTINE TESTING W REFLEX): HIV: NONREACTIVE

## 2010-08-21 DIAGNOSIS — Z1272 Encounter for screening for malignant neoplasm of vagina: Secondary | ICD-10-CM | POA: Insufficient documentation

## 2010-08-21 NOTE — Assessment & Plan Note (Signed)
Stable off treatment.  

## 2010-08-21 NOTE — Assessment & Plan Note (Signed)
Declines contraception.  Performed pap today.  Will ask about tdap at next visit.

## 2010-08-21 NOTE — Assessment & Plan Note (Signed)
Found on exam, will treat with metronidazole. Also screens for other STD as is at high risk

## 2010-08-21 NOTE — Assessment & Plan Note (Addendum)
Will resume Lantus 45 QHS and novolog 12 tid that patient states she has been taking.  a1c above goal.  Provided patient with rx for mete and strips.  Advised to keep glucose log and follow-up in 2-3 weeks for further titration.  Will need to asses if UTD on pneumovax

## 2010-08-22 ENCOUNTER — Encounter: Payer: Self-pay | Admitting: Family Medicine

## 2010-08-28 ENCOUNTER — Telehealth: Payer: Self-pay | Admitting: *Deleted

## 2010-08-28 ENCOUNTER — Ambulatory Visit: Payer: Medicaid Other

## 2010-08-28 NOTE — Telephone Encounter (Signed)
Patient states that she awoke this am and was nauseous and sweaty.  These are typically her first symptoms when her BS runs low.  From that point on she does not remember anything else.  Her family reports that she got in her car and drove around.  Patient was very upset about this and wanted to know it it was normal.  I explained that there are times when a diabetic's BS gets so low that this may happen.  Her BS at the time of our conversation was 187 and she denied any symptoms.  Advised her to come in today to be seen and made sure that she currently had someone there with her.  She stated that her mom was there.  Gave her an SDA appointment with Dr. Earnest Bailey at 1:30.

## 2010-09-02 ENCOUNTER — Ambulatory Visit (INDEPENDENT_AMBULATORY_CARE_PROVIDER_SITE_OTHER): Payer: Medicaid Other | Admitting: Family Medicine

## 2010-09-02 ENCOUNTER — Encounter: Payer: Self-pay | Admitting: Family Medicine

## 2010-09-02 VITALS — BP 99/74 | HR 88 | Ht 67.0 in | Wt 158.0 lb

## 2010-09-02 DIAGNOSIS — E109 Type 1 diabetes mellitus without complications: Secondary | ICD-10-CM

## 2010-09-02 DIAGNOSIS — Z23 Encounter for immunization: Secondary | ICD-10-CM

## 2010-09-02 DIAGNOSIS — F339 Major depressive disorder, recurrent, unspecified: Secondary | ICD-10-CM
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
## 2010-09-02 NOTE — Assessment & Plan Note (Addendum)
Only one hypoglycemic episode to 63.  Unable to correlate this to other symptoms she is having.  Also CBG's do not seem to correlate with her start of lantus and novolog obtained by prescriptions vs off the street.  Will continued with lowered lantus from 45 to 30 units which was patient initiated.  Asked her to check CBG's QID and to bring log to next visit in 2 weeks.    Will consider c-peptide and autoantibodies to clarify dx of  Type 1 dm.  May benefit from metformin.

## 2010-09-02 NOTE — Patient Instructions (Signed)
Keep sugar log for the next two weeks and bring to appointment Please check fasting, before each meal, and any time you notice unusual symptoms. Consider counseling and/or evaluation for meds Follow-up in 2 weeks You got your tetantus and pertussis vaccine today

## 2010-09-17 ENCOUNTER — Encounter: Payer: Self-pay | Admitting: Family Medicine

## 2010-09-17 ENCOUNTER — Ambulatory Visit (INDEPENDENT_AMBULATORY_CARE_PROVIDER_SITE_OTHER): Payer: Medicaid Other | Admitting: Family Medicine

## 2010-09-17 VITALS — BP 103/74 | HR 97 | Temp 98.2°F | Wt 158.0 lb

## 2010-09-17 DIAGNOSIS — F339 Major depressive disorder, recurrent, unspecified: Secondary | ICD-10-CM

## 2010-09-17 DIAGNOSIS — E109 Type 1 diabetes mellitus without complications: Secondary | ICD-10-CM

## 2010-09-17 LAB — C-PEPTIDE: C-Peptide: <0.1 ng/mL — ABNORMAL LOW (ref 0.80–3.90)

## 2010-09-17 NOTE — Assessment & Plan Note (Addendum)
Given history of being on orals, despite her understanding she is Type I i suspect she may have some endogenous insulin production.  Will check C peptide and if has some insulin production, will consider adding metformin as resistance may also be playing a role.     Will refer to Dm education.  Follow-up in 3 months.

## 2010-09-17 NOTE — Assessment & Plan Note (Signed)
She is reluctant to seek care.  Readdressed today, gave her information for mental health when she feels ready to seek care.

## 2010-09-17 NOTE — Patient Instructions (Signed)
Keep up your glucose log- it was very helpful! I will refer you to diabetes education, let us know if you dont hear about an appt i will call you if we need to start an oral medication Follow-up in 3 months Consider calling mental health - see contact info

## 2010-09-17 NOTE — Progress Notes (Signed)
  Subjective:    Patient ID: Barbara Clark, female    DOB: [birth year], 35 y.o.   MRN: [id number]  HPI  Here for follow-up of DM.  Brings her CBG log with her. Takes 30 units of lantus each morning with 12 Regular at each meal 3 AM lows in 53-61 range.  Overall range is 59-371.  Does not seem to have lows other times of day.  Not exercising.  Notes sometimes poor appetitie.  Oveall much improved from 200-300's she is used to.  Continues to feel sad. Review of Systems    see  HPI Objective:   Physical Exam GEN: Alert & Oriented, No acute distress.  Sad affect CV:  Regular Rate & Rhythm, no murmur Respiratory:  Normal work of breathing, CTAB Ext: no pre-tibial edema        Assessment & Plan:

## 2010-11-03 ENCOUNTER — Ambulatory Visit: Payer: Medicaid Other | Admitting: Family Medicine

## 2010-12-12 ENCOUNTER — Emergency Department (HOSPITAL_COMMUNITY)
Admission: EM | Admit: 2010-12-12 | Discharge: 2010-12-12 | Disposition: A | Discharge status: Home / Self Care | Payer: Medicaid Other | Attending: Emergency Medicine | Admitting: Emergency Medicine

## 2010-12-12 ENCOUNTER — Telehealth: Payer: Self-pay | Admitting: Family Medicine

## 2010-12-12 DIAGNOSIS — Z0389 Encounter for observation for other suspected diseases and conditions ruled out: Secondary | ICD-10-CM | POA: Insufficient documentation

## 2010-12-12 NOTE — Telephone Encounter (Signed)
Pt called emergency line because she injected 5 units of air accidentally in her arm about 20 minutes ago. She thought that she was injecting SQ insulin.  She did not know that the syringe was empty. She is scared, but is feeling ok . She denies CP/SOB, she states that her arm feels fine. She is currently at [facility] ED. I advised pt that it does not sound like she injected air into her vein. She may wait to be seen at [facility] if that will make her more comfortable. I advised patient to be very careful in the future. Patient agreed with plan and voiced understanding.

## 2010-12-12 NOTE — ED Notes (Addendum)
Pt handed this nurse the pager, states "im going to [facility]."  AMA form signed.  Pt left pre-triage.

## 2010-12-31 ENCOUNTER — Other Ambulatory Visit: Payer: Self-pay | Admitting: Family Medicine

## 2010-12-31 NOTE — Telephone Encounter (Signed)
Needs office visit.

## 2010-12-31 NOTE — Telephone Encounter (Signed)
Refill request

## 2011-02-24 ENCOUNTER — Emergency Department (HOSPITAL_COMMUNITY)
Admission: EM | Admit: 2011-02-24 | Discharge: 2011-02-24 | Disposition: A | Discharge status: Home / Self Care | Payer: Medicaid Other | Attending: Emergency Medicine | Admitting: Emergency Medicine

## 2011-02-24 ENCOUNTER — Encounter (HOSPITAL_COMMUNITY): Payer: Self-pay

## 2011-02-24 DIAGNOSIS — R112 Nausea with vomiting, unspecified: Secondary | ICD-10-CM | POA: Insufficient documentation

## 2011-02-24 DIAGNOSIS — E1069 Type 1 diabetes mellitus with other specified complication: Secondary | ICD-10-CM | POA: Insufficient documentation

## 2011-02-24 DIAGNOSIS — E162 Hypoglycemia, unspecified: Secondary | ICD-10-CM

## 2011-02-24 DIAGNOSIS — Z794 Long term (current) use of insulin: Secondary | ICD-10-CM | POA: Insufficient documentation

## 2011-02-24 LAB — URINALYSIS, ROUTINE W REFLEX MICROSCOPIC
Bilirubin Urine: NEGATIVE
Glucose, UA: 100 mg/dL — AB
Hgb urine dipstick: NEGATIVE
Leukocytes, UA: NEGATIVE
Nitrite: NEGATIVE
Protein, ur: NEGATIVE mg/dL
Specific Gravity, Urine: 1.03 (ref 1.005–1.030)
Urobilinogen, UA: 0.2 mg/dL (ref 0.0–1.0)
pH: 6 (ref 5.0–8.0)

## 2011-02-24 LAB — CBC
HCT: 33.4 % — ABNORMAL LOW (ref 36.0–46.0)
Hemoglobin: 10.9 g/dL — ABNORMAL LOW (ref 12.0–15.0)
MCH: 30.2 pg (ref 26.0–34.0)
MCHC: 32.6 g/dL (ref 30.0–36.0)
MCV: 92.5 fL (ref 78.0–100.0)
Platelets: 358 10*3/uL (ref 150–400)
RBC: 3.61 MIL/uL — ABNORMAL LOW (ref 3.87–5.11)
RDW: 13.1 % (ref 11.5–15.5)
WBC: 8.8 10*3/uL (ref 4.0–10.5)

## 2011-02-24 LAB — COMPREHENSIVE METABOLIC PANEL
ALT: 8 U/L (ref 0–35)
AST: 12 U/L (ref 0–37)
Albumin: 3.2 g/dL — ABNORMAL LOW (ref 3.5–5.2)
Alkaline Phosphatase: 99 U/L (ref 39–117)
BUN: 10 mg/dL (ref 6–23)
CO2: 26 mEq/L (ref 19–32)
Calcium: 8.7 mg/dL (ref 8.4–10.5)
Chloride: 105 mEq/L (ref 96–112)
Creatinine, Ser: 0.58 mg/dL (ref 0.50–1.10)
GFR calc Af Amer: >90 mL/min (ref >90)
GFR calc non Af Amer: >90 mL/min (ref >90)
Glucose, Bld: 98 mg/dL (ref 70–99)
Potassium: 3.7 mEq/L (ref 3.5–5.1)
Sodium: 142 mEq/L (ref 135–145)
Total Bilirubin: 0.2 mg/dL — ABNORMAL LOW (ref 0.3–1.2)
Total Protein: 6.8 g/dL (ref 6.0–8.3)

## 2011-02-24 LAB — DIFFERENTIAL
Basophils Absolute: 0 10*3/uL (ref 0.0–0.1)
Basophils Relative: 0 % (ref 0–1)
Eosinophils Absolute: 0.1 10*3/uL (ref 0.0–0.7)
Eosinophils Relative: 1 % (ref 0–5)
Lymphocytes Relative: 23 % (ref 12–46)
Lymphs Abs: 2 10*3/uL (ref 0.7–4.0)
Monocytes Absolute: 0.5 10*3/uL (ref 0.1–1.0)
Monocytes Relative: 6 % (ref 3–12)
Neutro Abs: 6.2 10*3/uL (ref 1.7–7.7)
Neutrophils Relative %: 71 % (ref 43–77)

## 2011-02-24 LAB — GLUCOSE, CAPILLARY
Glucose-Capillary: 117 mg/dL — ABNORMAL HIGH (ref 70–99)
Glucose-Capillary: 96 mg/dL (ref 70–99)

## 2011-02-24 LAB — PREGNANCY, URINE: Preg Test, Ur: NEGATIVE

## 2011-02-24 LAB — POCT PREGNANCY, URINE: Preg Test, Ur: NEGATIVE

## 2011-02-24 MED ORDER — DEXTROSE 50 % IV SOLN
INTRAVENOUS | Status: AC
  Administered 2011-02-24: 08:00:00
  Filled 2011-02-24: qty 50

## 2011-02-24 MED ORDER — SODIUM CHLORIDE 0.9 % IV SOLN
Freq: Once | INTRAVENOUS | Status: AC
  Administered 2011-02-24: 09:00:00 via INTRAVENOUS

## 2011-02-24 MED ORDER — ONDANSETRON HCL 4 MG/2ML IJ SOLN
INTRAMUSCULAR | Status: AC
  Administered 2011-02-24: 08:00:00
  Filled 2011-02-24: qty 2

## 2011-02-24 MED ORDER — PROMETHAZINE HCL 25 MG/ML IJ SOLN
12.5000 mg | Freq: Once | INTRAMUSCULAR | Status: AC
  Administered 2011-02-24: 12.5 mg via INTRAVENOUS
  Filled 2011-02-24: qty 1

## 2011-02-24 MED ORDER — PROMETHAZINE HCL 25 MG PO TABS: 25.0000 mg | ORAL_TABLET | Freq: Four times a day (QID) | ORAL | Status: AC | PRN

## 2011-02-24 MED ORDER — KETOROLAC TROMETHAMINE 30 MG/ML IJ SOLN
30.0000 mg | Freq: Once | INTRAMUSCULAR | Status: AC
  Administered 2011-02-24: 30 mg via INTRAVENOUS
  Filled 2011-02-24: qty 1

## 2011-02-24 NOTE — ED Notes (Signed)
Per ems, upon arrival pt called out for hypoglycemia, n/v.  Pt's cbg was 60 upon arrival.  Per ems, pt was give 25mg  of D50 and 4mg  Zofran.  Pt cbg upon arrival was 122.  Pt alert and oriented.

## 2011-02-24 NOTE — ED Notes (Signed)
[id number]<AV> Expected date:02/24/11<BR> Expected time: 7:39 AM<BR> Means of arrival:Ambulance<BR> Comments:<BR> N/v;diabetic

## 2011-02-24 NOTE — ED Provider Notes (Signed)
History     CSN: [id number]  Arrival date & time 02/24/11  4132   First MD Initiated Contact with Patient 02/24/11 0813      Chief Complaint  Patient presents with  . Hypoglycemia  . Nausea    (Consider location/radiation/quality/duration/timing/severity/associated sxs/prior treatment) HPI Comments: Woke this morning "not feeling well".  Checked sugar and was 60.  Vomited once.  No fevers, chills, or abd pain.  No other complaints.  Still doesn't feel well.  Patient is a 36 y.o. female presenting with vomiting. The history is provided by the patient.  Emesis  This is a new problem. Episode onset: this morning. The problem has not changed since onset.The emesis has an appearance of stomach contents. There has been no fever. Pertinent negatives include no abdominal pain, no chills, no diarrhea and no fever.    Past Medical History  Diagnosis Date  . Diabetes mellitus     Type I  . Depression   . Anxiety   . HSV-2 (herpes simplex virus 2) infection 2012    serology    Past Surgical History  Procedure Date  . Tubal ligation     Family History  Problem Relation Age of Onset  . Diabetes Mother   . Hypertension Mother   . Diabetes Maternal Grandmother     History  Substance Use Topics  . Smoking status: Current Everyday Smoker -- 0.5 packs/day  . Smokeless tobacco: Not on file  . Alcohol Use: No    OB History    Grav Para Term Preterm Abortions TAB SAB Ect Mult Living                  Review of Systems  Constitutional: Negative for fever and chills.  Gastrointestinal: Positive for vomiting. Negative for abdominal pain and diarrhea.  All other systems reviewed and are negative.    Allergies  Review of patient's allergies indicates no known allergies.  Home Medications   Current Outpatient Rx  Name Route Sig Dispense Refill  . INSULIN GLARGINE 100 UNIT/ML [place] SOLN Subcutaneous Inject 30 Units into the skin at bedtime. 10 mL 3  . LANTUS 100 UNIT/ML [place] SOLN   INJECT 45 UNITS SUBCUTANEOUSLY INTO THE SKIN AT BEDTIME 10 mL 2  . NOVOLIN R RELION 100 UNIT/ML IJ SOLN  INJECT 12 UNITS SUBCUTANEOUSLY THREE TIMES DAILY BEFORE MEALS 10 mL 2    BP 97/63  Pulse 74  Temp(Src) 98.1 F (36.7 C) (Oral)  Resp 18  Ht 5\' 7"  (1.702 m)  Wt 135 lb (61.236 kg)  BMI 21.14 kg/m2  SpO2 100%  LMP 02/21/2011  Physical Exam  Nursing note and vitals reviewed. Constitutional: She is oriented to person, place, and time. She appears well-developed and well-nourished. No distress.  HENT:  Head: Normocephalic and atraumatic.  Neck: Normal range of motion. Neck supple.  Cardiovascular: Normal rate and regular rhythm.  Exam reveals no gallop and no friction rub.   No murmur heard. Pulmonary/Chest: Effort normal and breath sounds normal. No respiratory distress. She has no wheezes.  Abdominal: Soft. Bowel sounds are normal. She exhibits no distension. There is no tenderness.  Musculoskeletal: Normal range of motion.  Neurological: She is alert and oriented to person, place, and time.  Skin: Skin is warm and dry. She is not diaphoretic.    ED Course  Procedures (including critical care time)  Labs Reviewed  GLUCOSE, CAPILLARY - Abnormal; Notable for the following:    Glucose-Capillary 117 (*)    All  other components within normal limits  CBC  DIFFERENTIAL  COMPREHENSIVE METABOLIC PANEL  URINALYSIS, ROUTINE W REFLEX MICROSCOPIC  PREGNANCY, URINE   No results found.   No diagnosis found.    MDM  Fluids in, labs look fine, sugar is 98.  Will discharge to home with phenergan, time.  Return prn.        Geoffery Lyons, MD 02/24/11 1024

## 2011-02-25 MED FILL — Dextrose Inj 50%: INTRAVENOUS | Qty: 50 | Status: AC

## 2011-06-03 ENCOUNTER — Other Ambulatory Visit: Payer: Self-pay | Admitting: Family Medicine

## 2011-06-29 ENCOUNTER — Other Ambulatory Visit: Payer: Self-pay | Admitting: Family Medicine

## 2011-08-12 ENCOUNTER — Emergency Department (HOSPITAL_COMMUNITY)
Admission: EM | Admit: 2011-08-12 | Discharge: 2011-08-13 | Disposition: A | Discharge status: Home / Self Care | Payer: Medicaid Other | Attending: Emergency Medicine | Admitting: Emergency Medicine

## 2011-08-12 ENCOUNTER — Encounter (HOSPITAL_COMMUNITY): Payer: Self-pay | Admitting: Emergency Medicine

## 2011-08-12 DIAGNOSIS — B3731 Acute candidiasis of vulva and vagina: Secondary | ICD-10-CM | POA: Insufficient documentation

## 2011-08-12 DIAGNOSIS — E109 Type 1 diabetes mellitus without complications: Secondary | ICD-10-CM | POA: Insufficient documentation

## 2011-08-12 DIAGNOSIS — F3289 Other specified depressive episodes: Secondary | ICD-10-CM | POA: Insufficient documentation

## 2011-08-12 DIAGNOSIS — F17200 Nicotine dependence, unspecified, uncomplicated: Secondary | ICD-10-CM | POA: Insufficient documentation

## 2011-08-12 DIAGNOSIS — F411 Generalized anxiety disorder: Secondary | ICD-10-CM | POA: Insufficient documentation

## 2011-08-12 DIAGNOSIS — B373 Candidiasis of vulva and vagina: Secondary | ICD-10-CM | POA: Insufficient documentation

## 2011-08-12 DIAGNOSIS — N390 Urinary tract infection, site not specified: Secondary | ICD-10-CM | POA: Insufficient documentation

## 2011-08-12 DIAGNOSIS — F329 Major depressive disorder, single episode, unspecified: Secondary | ICD-10-CM | POA: Insufficient documentation

## 2011-08-12 DIAGNOSIS — R739 Hyperglycemia, unspecified: Secondary | ICD-10-CM

## 2011-08-12 NOTE — ED Notes (Signed)
Pt alert, arrives from home, c/o "cottage cheese like discharge" from vagina, onset was several days ago, tried using "plain yogurt on vagina", denies relief, resp even unlabored, skin pwd

## 2011-08-13 LAB — URINALYSIS, ROUTINE W REFLEX MICROSCOPIC
Bilirubin Urine: NEGATIVE
Glucose, UA: >1000 mg/dL — AB
Ketones, ur: NEGATIVE mg/dL
Nitrite: NEGATIVE
Protein, ur: NEGATIVE mg/dL
Specific Gravity, Urine: 1.02 (ref 1.005–1.030)
Urobilinogen, UA: 0.2 mg/dL (ref 0.0–1.0)
pH: 6 (ref 5.0–8.0)

## 2011-08-13 LAB — URINE MICROSCOPIC-ADD ON

## 2011-08-13 LAB — POCT I-STAT, CHEM 8
BUN: 7 mg/dL (ref 6–23)
Calcium, Ion: 1.19 mmol/L (ref 1.12–1.23)
Chloride: 103 mEq/L (ref 96–112)
Creatinine, Ser: 0.5 mg/dL (ref 0.50–1.10)
Glucose, Bld: 247 mg/dL — ABNORMAL HIGH (ref 70–99)
HCT: 38 % (ref 36.0–46.0)
Hemoglobin: 12.9 g/dL (ref 12.0–15.0)
Potassium: 4 mEq/L (ref 3.5–5.1)
Sodium: 138 mEq/L (ref 135–145)
TCO2: 24 mmol/L (ref 0–100)

## 2011-08-13 LAB — WET PREP, GENITAL: Trich, Wet Prep: NONE SEEN

## 2011-08-13 LAB — GLUCOSE, CAPILLARY
Glucose-Capillary: 211 mg/dL — ABNORMAL HIGH (ref 70–99)
Glucose-Capillary: 327 mg/dL — ABNORMAL HIGH (ref 70–99)

## 2011-08-13 LAB — POCT PREGNANCY, URINE: Preg Test, Ur: NEGATIVE

## 2011-08-13 MED ORDER — INSULIN REGULAR HUMAN 100 UNIT/ML IJ SOLN: 10.0000 [IU] | Freq: Once | INTRAMUSCULAR | Status: DC

## 2011-08-13 MED ORDER — SODIUM CHLORIDE 0.9 % IV BOLUS (SEPSIS)
1000.0000 mL | Freq: Once | INTRAVENOUS | Status: AC
  Administered 2011-08-13: 1000 mL via INTRAVENOUS

## 2011-08-13 MED ORDER — METRONIDAZOLE 500 MG PO TABS: 500.0000 mg | ORAL_TABLET | Freq: Two times a day (BID) | ORAL | Status: AC

## 2011-08-13 MED ORDER — FLUCONAZOLE 200 MG PO TABS: 200.0000 mg | ORAL_TABLET | Freq: Every day | ORAL | Status: AC

## 2011-08-13 MED ORDER — INSULIN ASPART 100 UNIT/ML ~~LOC~~ SOLN
10.0000 [IU] | Freq: Once | SUBCUTANEOUS | Status: AC
  Administered 2011-08-13: 10 [IU] via SUBCUTANEOUS
  Filled 2011-08-13: qty 1

## 2011-08-13 MED ORDER — FLUCONAZOLE 200 MG PO TABS
200.0000 mg | ORAL_TABLET | Freq: Once | ORAL | Status: AC
  Administered 2011-08-13: 200 mg via ORAL
  Filled 2011-08-13: qty 1

## 2011-08-13 MED ORDER — NITROFURANTOIN MONOHYD MACRO 100 MG PO CAPS: 100.0000 mg | ORAL_CAPSULE | Freq: Two times a day (BID) | ORAL | Status: AC

## 2011-08-13 MED ORDER — DEXTROSE 5 % IV SOLN
1.0000 g | Freq: Once | INTRAVENOUS | Status: AC
  Administered 2011-08-13: 04:00:00 via INTRAVENOUS
  Filled 2011-08-13: qty 10

## 2011-08-13 NOTE — ED Notes (Signed)
Pt is diabetic and CBG has been 400-500s in the past 3 days. Pt states she think she has a yeast infection. Pt reports itching and then cottage cheese discharge noted Monday. Pt reports taking OTC one day med for yeast infection and felt like it made it worse with burning and swelling. Pt stopped taking medication and placed plain yogurt yesterday in vagina to treat it.

## 2011-08-13 NOTE — ED Provider Notes (Signed)
Medical screening examination/treatment/procedure(s) were performed by non-physician practitioner and as supervising physician I was immediately available for consultation/collaboration.   Zyiere Rosemond L Mckaylah Bettendorf, MD 08/13/11 2251 

## 2011-08-13 NOTE — ED Provider Notes (Signed)
History     CSN: [id number]  Arrival date & time 08/12/11  2204   First MD Initiated Contact with Patient 08/13/11 0155      Chief Complaint  Patient presents with  . Vaginal Discharge   HPI  History provided by the patient. Patient is a 36 year old female with history of type 1 diabetes who presents with complaints of thick white vaginal discharge. Patient states she believes she has a yeast infection for the past 3-4 days. Patient tried using over-the-counter one day yeast infection medication but is not had any improvement. Patient also reports some burning to the area at this time. Patient states she feels very raw. Patient also admits to using some plain yogurt to see if this would help to the area. Patient denies having any additional symptoms. She denies any dysuria, hematuria, urinary frequency. She denies any abdominal pain, nausea or vomiting. No fever, chills or sweats.   Past Medical History  Diagnosis Date  . Diabetes mellitus     Type I  . Depression   . Anxiety   . HSV-2 (herpes simplex virus 2) infection 2012    serology    Past Surgical History  Procedure Date  . Tubal ligation     Family History  Problem Relation Age of Onset  . Diabetes Mother   . Hypertension Mother   . Diabetes Maternal Grandmother     History  Substance Use Topics  . Smoking status: Current Everyday Smoker -- 0.5 packs/day  . Smokeless tobacco: Not on file  . Alcohol Use: No    OB History    Grav Para Term Preterm Abortions TAB SAB Ect Mult Living                  Review of Systems  Constitutional: Negative for fever and chills.  Gastrointestinal: Negative for nausea, vomiting, abdominal pain and diarrhea.  Genitourinary: Positive for vaginal discharge. Negative for dysuria, frequency, hematuria, flank pain and vaginal bleeding.    Allergies  Review of patient's allergies indicates no known allergies.  Home Medications   Current Outpatient Rx  Name Route Sig  Dispense Refill  . LANTUS 100 UNIT/ML  SOLN  INJECT 45 UNITS SUBCUTANEOUSLY INTO THE SKIN AT BEDTIME 10 mL 0  . MICONAZOLE NITRATE 200 MG VA SUPP Vaginal Place 200 mg vaginally at bedtime.    Marland Kitchen NOVOLIN R 100 UNIT/ML IJ SOLN  INJECT 12 UNITS SUBCUTANEOUSLY THREE TIMES DAILY BEFORE MEALS 10 mL 1    BP 119/75  Pulse 78  Temp 98 F (36.7 C)  Resp 16  SpO2 100%  LMP 08/02/2011  Physical Exam  Nursing note and vitals reviewed. Constitutional: She is oriented to person, place, and time. She appears well-developed and well-nourished. No distress.  HENT:  Head: Normocephalic.  Cardiovascular: Normal rate and regular rhythm.   Pulmonary/Chest: Effort normal and breath sounds normal. No respiratory distress. She has no wheezes. She has no rales.  Abdominal: Soft. She exhibits no distension. There is no tenderness. There is no rebound and no guarding.  Genitourinary: Cervix exhibits discharge. Cervix exhibits no motion tenderness and no friability. Right adnexum displays no mass, no tenderness and no fullness. Left adnexum displays no mass, no tenderness and no fullness.       Chaperone was present. There is a large amount of thick white discharge  Neurological: She is alert and oriented to person, place, and time.  Skin: Skin is warm and dry.  Psychiatric: She has a normal mood  and affect. Her behavior is normal.    ED Course  Procedures   Results for orders placed during the hospital encounter of 08/12/11  URINALYSIS, ROUTINE W REFLEX MICROSCOPIC      Component Value Range   Color, Urine YELLOW  YELLOW   APPearance CLOUDY (*) CLEAR   Specific Gravity, Urine 1.020  1.005 - 1.030   pH 6.0  5.0 - 8.0   Glucose, UA >1000 (*) NEGATIVE mg/dL   Hgb urine dipstick SMALL (*) NEGATIVE   Bilirubin Urine NEGATIVE  NEGATIVE   Ketones, ur NEGATIVE  NEGATIVE mg/dL   Protein, ur NEGATIVE  NEGATIVE mg/dL   Urobilinogen, UA 0.2  0.0 - 1.0 mg/dL   Nitrite NEGATIVE  NEGATIVE   Leukocytes, UA  MODERATE (*) NEGATIVE  GLUCOSE, CAPILLARY      Component Value Range   Glucose-Capillary 327 (*) 70 - 99 mg/dL  POCT PREGNANCY, URINE      Component Value Range   Preg Test, Ur NEGATIVE  NEGATIVE  WET PREP, GENITAL      Component Value Range   Yeast Wet Prep HPF POC MANY (*) NONE SEEN   Trich, Wet Prep NONE SEEN  NONE SEEN   Clue Cells Wet Prep HPF POC MANY (*) NONE SEEN   WBC, Wet Prep HPF POC MANY (*) NONE SEEN  URINE MICROSCOPIC-ADD ON      Component Value Range   Squamous Epithelial / LPF MANY (*) RARE   WBC, UA 21-50  <3 WBC/hpf   RBC / HPF 3-6  <3 RBC/hpf   Bacteria, UA MANY (*) RARE   Urine-Other RARE YEAST    POCT I-STAT, CHEM 8      Component Value Range   Sodium 138  135 - 145 mEq/L   Potassium 4.0  3.5 - 5.1 mEq/L   Chloride 103  96 - 112 mEq/L   BUN 7  6 - 23 mg/dL   Creatinine, Ser 6.96  0.50 - 1.10 mg/dL   Glucose, Bld 295 (*) 70 - 99 mg/dL   Calcium, Ion 2.84  1.32 - 1.23 mmol/L   TCO2 24  0 - 100 mmol/L   Hemoglobin 12.9  12.0 - 15.0 g/dL   HCT 44.0  10.2 - 72.5 %  GLUCOSE, CAPILLARY      Component Value Range   Glucose-Capillary 211 (*) 70 - 99 mg/dL        1. Vaginal candidiasis   2. UTI (lower urinary tract infection)   3. Hyperglycemia       MDM  2:30 AM patient seen and evaluated. Pelvic exam consistent with yeast infection. Diflucan ordered. Patient also was signs of UTI. Rocephin ordered.  Patient also with elevated blood sugar. Will order i-STAT. Patient states she has had a very difficult time controlling her blood sugar.  Blood sugar has improved. Normal anion gap. Patient feeling well. We'll provide prescriptions for UTI and yeast infection.    Angus Seller, [place] 08/13/11 [phone]

## 2011-08-14 LAB — GC/CHLAMYDIA PROBE AMP, GENITAL
Chlamydia, DNA Probe: NEGATIVE
GC Probe Amp, Genital: NEGATIVE

## 2011-08-27 ENCOUNTER — Ambulatory Visit (INDEPENDENT_AMBULATORY_CARE_PROVIDER_SITE_OTHER): Payer: Medicaid Other | Admitting: Family Medicine

## 2011-08-27 ENCOUNTER — Encounter: Payer: Self-pay | Admitting: Family Medicine

## 2011-08-27 VITALS — BP 118/82 | HR 85 | Ht 67.0 in | Wt 163.8 lb

## 2011-08-27 DIAGNOSIS — F339 Major depressive disorder, recurrent, unspecified: Secondary | ICD-10-CM

## 2011-08-27 DIAGNOSIS — E109 Type 1 diabetes mellitus without complications: Secondary | ICD-10-CM

## 2011-08-27 MED ORDER — FLUCONAZOLE 150 MG PO TABS: 150.0000 mg | ORAL_TABLET | Freq: Once | ORAL | Status: DC

## 2011-08-27 NOTE — Assessment & Plan Note (Addendum)
Worsened.  No SI, appars to be appropriately goal oriented to improved her financial situation contributing to her depressed mood.  She reports some support persons, declines need for further treatment.  I gave her info resources for [place] encouraged to be vigilant and seek early care, [place] [facility] for psych and financial/job difficulty.

## 2011-08-27 NOTE — Progress Notes (Signed)
  Subjective:    Patient ID: Barbara Clark, female    DOB: 12-14-1975, 36 y.o.   MRN: [id number]  HPI  DIABETES  Taking and tolerating: yes lantus + novlog Fasting blood sugars: no log, meter does not have time set.  Values from 41-300's. Hypoglycemic symptoms: Had several hypoglycemic episodes in June.  EMS was called.  States were tiems hen she didnit eat. Visual problems: no Monitoring feet: yes Numbness/Tingling: no Last eye exam: none in past year Diabetic Labs:  Lab Results  Component Value Date   HGBA1C 8.3 08/19/2010   HGBA1C 9.0 05/15/2008   HGBA1C 8.7 02/28/2008   Lab Results  Component Value Date   LDLCALC 89 08/19/2010   CREATININE 0.50 08/13/2011   Last microalbumin: No results found for this basename: MICROALBUR, [id number]   Depression:  States was sad as her FOB died in 06/17/22, but feels much support from his family.  Feel she is doing better, but is still worried about not having a job.  No SI.  She declines need to teratment   Review of Systems See HPI    Objective:   Physical Exam GEN: Alert & Oriented, No acute distress CV:  Regular Rate & Rhythm, no murmur Respiratory:  Normal work of breathing, CTAB Abd:  + BS, soft, no tenderness to palpation Ext: no pre-tibial edema Psych:  Linear thought, Good insight       Assessment & Plan:

## 2011-08-27 NOTE — Assessment & Plan Note (Addendum)
Deteriorated.  Concerned with hypoglycemia.  Last a1c not at goal. No recent hypoglycemia, seems diet related.  Will continue lantus 45 QHS, advised to decrease novolog by half is eating lighter meal.  Encouraged her not to skip meals.  Gave her a log, will check CBG's QID, follow-up in 1 week for further titration

## 2011-08-27 NOTE — Patient Instructions (Addendum)
If you eat half, cut novolog in half  Try not to skip meals  Bring log and return in one week

## 2011-09-02 ENCOUNTER — Encounter: Payer: Self-pay | Admitting: Family Medicine

## 2011-09-02 ENCOUNTER — Ambulatory Visit (INDEPENDENT_AMBULATORY_CARE_PROVIDER_SITE_OTHER): Payer: Medicaid Other | Admitting: Family Medicine

## 2011-09-02 VITALS — BP 113/78 | HR 89 | Ht 67.0 in | Wt 166.0 lb

## 2011-09-02 DIAGNOSIS — Z5989 Other problems related to housing and economic circumstances: Secondary | ICD-10-CM

## 2011-09-02 DIAGNOSIS — Z598 Other problems related to housing and economic circumstances: Secondary | ICD-10-CM

## 2011-09-02 DIAGNOSIS — E109 Type 1 diabetes mellitus without complications: Secondary | ICD-10-CM

## 2011-09-02 DIAGNOSIS — F339 Major depressive disorder, recurrent, unspecified: Secondary | ICD-10-CM

## 2011-09-02 DIAGNOSIS — E119 Type 2 diabetes mellitus without complications: Secondary | ICD-10-CM

## 2011-09-02 LAB — POCT GLYCOSYLATED HEMOGLOBIN (HGB A1C): Hemoglobin A1C: 8.2

## 2011-09-02 NOTE — Patient Instructions (Addendum)
Decrease Lantus to 40 units at night from 45  Keep novlog the same  Try to eat regularly  Will ask social worker to give you a call to see if she can help  Follow-up in 4 weeks, bring your log with you

## 2011-09-03 DIAGNOSIS — Z598 Other problems related to housing and economic circumstances: Secondary | ICD-10-CM | POA: Insufficient documentation

## 2011-09-03 DIAGNOSIS — Z5987 Material hardship due to limited financial resources, not elsewhere classified: Secondary | ICD-10-CM | POA: Insufficient documentation

## 2011-09-03 DIAGNOSIS — Z5989 Other problems related to housing and economic circumstances: Secondary | ICD-10-CM | POA: Insufficient documentation

## 2011-09-03 NOTE — Assessment & Plan Note (Signed)
Unemployed, not eating regularly due to food scarcity, impacting her insulin dependent diabetes.  Will refer to SW.

## 2011-09-03 NOTE — Progress Notes (Signed)
  Subjective:    Patient ID: Barbara Clark, female    DOB: 12-22-1975, 36 y.o.   MRN: [id number]  HPI Here for 1` week follow-up of depression and diabetes  Depression: states feeling better than last week.  Has not contacted resources I advised for treatment.  DIABETES  Taking and tolerating: yes: Lantus 40 QHS, Novolog 12 tid   Glucose log:  Before breakfast Before lunch  Before dinner  Bedtime 49   334   380   289  84   147   149   198 127   Did not eat  Did not eat  82 94     169   72   120 164   55   127  Diabetic Labs:  Lab Results  Component Value Date   HGBA1C 8.2 09/02/2011   HGBA1C 8.3 08/19/2010   HGBA1C 9.0 05/15/2008   Lab Results  Component Value Date   LDLCALC 89 08/19/2010   CREATININE 0.50 08/13/2011   Last microalbumin: No results found for this basename: MICROALBUR, [id number]       Review of Systems See HPI    Objective:   Physical Exam  GEN: NAD, with her daughter      Assessment & Plan:

## 2011-09-03 NOTE — Assessment & Plan Note (Signed)
Improved glucoses while checking this week compared to her a1c of 8.2.  2 readings below 60.  Will decrease lantus from 45 to 40 units.  Will keep novolog at 12.  She will decrease novolog when not eating, but encouraged her to eat regularly.  Will fwd to social work as food scarcity is a factor.  Will continue to keep log, bring back in 1 month

## 2011-09-03 NOTE — Assessment & Plan Note (Signed)
Stable.  She feels it is situational due to unemployment.  Encouraged considering seeking help.

## 2011-09-07 ENCOUNTER — Telehealth: Payer: Self-pay | Admitting: Clinical

## 2011-09-07 NOTE — Telephone Encounter (Signed)
Message copied by Theresia Bough on Mon Sep 07, 2011  3:44 PM ------      Message from: Macy Mis      Created: Thu Sep 03, 2011  9:10 AM       Not eating due to no food, insulin dependent diabetes.  Can you call to offer resources?  Thanks!

## 2011-09-07 NOTE — Telephone Encounter (Signed)
Clinical Child psychotherapist (CSW) received a referral to assist pt with additional resources. CSW contacted pt and explored pt current situation. Pt stated she is unemployed, having a difficult time paying for her $100 rent, bills, food and children's back to school needs. CSW validated pt concerns and tearfulness. CSW explored whether pt has applied for employment, if pt has any family/friends who are able to help and which resources pt has already pursued. Pt stated she was applying for jobs however since she is not able to work until next week when her children go back to school she has not continued applying. Pt stated her mother is not very supportive and pt does not have any other friends or family who are able to assist pt and her 2 children. Pt stated she has received financial assistance from [employer] and has gone to a Programme researcher, broadcasting/film/video. CSW praised pt for trying to find employment and encouraged pt to continue applying as pt could potentially receive an interview by the time her children return to school. CSW encouraged pt to contact the school social worker at [employer] (when to Child psychotherapist returns) and explore what resources the school social worker may have to assist pt children. CSW also provided pt with contact information to the [employer] [phone] [address] [place]/ [phone]) who can provide pt with a clothing voucher for her children as well as provide pt with a box of food. Pt was very appreciative of CSW resources. CSW will continue exploring other options however currently pt is receiving food stamps, has housing and plans to begin searching for employment.  Theresia Bough, MSW, Theresia Majors [phone]

## 2011-09-18 ENCOUNTER — Other Ambulatory Visit: Payer: Self-pay | Admitting: Family Medicine

## 2011-09-18 MED ORDER — GLUCOSE BLOOD VI STRP: ORAL_STRIP | Status: DC

## 2011-10-05 ENCOUNTER — Ambulatory Visit: Payer: Medicaid Other | Admitting: Family Medicine

## 2011-10-26 ENCOUNTER — Other Ambulatory Visit: Payer: Self-pay | Admitting: Family Medicine

## 2011-11-11 ENCOUNTER — Other Ambulatory Visit: Payer: Self-pay | Admitting: Family Medicine

## 2011-12-04 ENCOUNTER — Emergency Department (INDEPENDENT_AMBULATORY_CARE_PROVIDER_SITE_OTHER)
Admission: EM | Admit: 2011-12-04 | Discharge: 2011-12-04 | Disposition: A | Discharge status: Home / Self Care | Payer: Self-pay | Source: Home / Self Care

## 2011-12-04 ENCOUNTER — Encounter (HOSPITAL_COMMUNITY): Payer: Self-pay

## 2011-12-04 DIAGNOSIS — N390 Urinary tract infection, site not specified: Secondary | ICD-10-CM

## 2011-12-04 LAB — URINALYSIS, ROUTINE W REFLEX MICROSCOPIC
Bilirubin Urine: NEGATIVE
Glucose, UA: >1000 mg/dL — AB
Hgb urine dipstick: NEGATIVE
Ketones, ur: NEGATIVE mg/dL
Leukocytes, UA: NEGATIVE
Nitrite: NEGATIVE
Protein, ur: NEGATIVE mg/dL
Specific Gravity, Urine: 1.034 — ABNORMAL HIGH (ref 1.005–1.030)
Urobilinogen, UA: 0.2 mg/dL (ref 0.0–1.0)
pH: 6.5 (ref 5.0–8.0)

## 2011-12-04 LAB — URINE MICROSCOPIC-ADD ON

## 2011-12-04 LAB — POCT URINALYSIS DIP (DEVICE)
Bilirubin Urine: NEGATIVE
Glucose, UA: 500 mg/dL — AB
Hgb urine dipstick: NEGATIVE
Ketones, ur: NEGATIVE mg/dL
Leukocytes, UA: NEGATIVE
Nitrite: POSITIVE — AB
Protein, ur: NEGATIVE mg/dL
Specific Gravity, Urine: 1.01 (ref 1.005–1.030)
Urobilinogen, UA: 0.2 mg/dL (ref 0.0–1.0)
pH: 7 (ref 5.0–8.0)

## 2011-12-04 LAB — POCT PREGNANCY, URINE: Preg Test, Ur: NEGATIVE

## 2011-12-04 MED ORDER — NITROFURANTOIN MONOHYD MACRO 100 MG PO CAPS: 100.0000 mg | ORAL_CAPSULE | Freq: Two times a day (BID) | ORAL | Status: DC

## 2011-12-04 MED ORDER — METRONIDAZOLE 500 MG PO TABS
ORAL_TABLET | ORAL | Status: AC
  Filled 2011-12-04: qty 4

## 2011-12-04 MED ORDER — METRONIDAZOLE 500 MG PO TABS
2000.0000 mg | ORAL_TABLET | Freq: Once | ORAL | Status: AC
  Administered 2011-12-04: 2000 mg via ORAL

## 2011-12-04 NOTE — ED Provider Notes (Signed)
History     CSN: [id number]  Arrival date & time 12/04/11  1150   None     Chief Complaint  Patient presents with  . Urinary Tract Infection    (Consider location/radiation/quality/duration/timing/severity/associated sxs/prior treatment) HPI Comments: Mild dysuria.  No frequency, hematuria or urgency.  "fishy" odor to urine.  No vaginal d/c or pelvic pain.    Urine is "dark".  Drinks plenty of water.  Patient is a 36 y.o. female presenting with urinary tract infection. The history is provided by the patient. No language interpreter was used.  Urinary Tract Infection This is a new problem. The current episode started more than 2 days ago. The problem occurs constantly. Nothing aggravates the symptoms. She has tried nothing for the symptoms.    Past Medical History  Diagnosis Date  . Diabetes mellitus     Type I  . Depression   . Anxiety   . HSV-2 (herpes simplex virus 2) infection 2012    serology    Past Surgical History  Procedure Date  . Tubal ligation     Family History  Problem Relation Age of Onset  . Diabetes Mother   . Hypertension Mother   . Diabetes Maternal Grandmother     History  Substance Use Topics  . Smoking status: Current Every Day Smoker -- 1.0 packs/day  . Smokeless tobacco: Not on file     Comment: is trying to quitt  . Alcohol Use: No    OB History    Grav Para Term Preterm Abortions TAB SAB Ect Mult Living                  Review of Systems  Constitutional: Negative for fever and chills.  Genitourinary: Positive for dysuria. Negative for urgency, frequency, hematuria, decreased urine volume, vaginal bleeding, vaginal discharge, vaginal pain and pelvic pain.  Musculoskeletal: Positive for back pain.  All other systems reviewed and are negative.    Allergies  Review of patient's allergies indicates no known allergies.  Home Medications   Current Outpatient Rx  Name  Route  Sig  Dispense  Refill  . GLUCOSE BLOOD VI STRP        Check 4 times a day and as needed   100 each   12   . LANTUS 100 UNIT/ML [employer] SOLN      INJECT 45 UNITS SUBCUTANEOUSLY INTO THE SKIN AT BEDTIME   10 mL   0   . NITROFURANTOIN MONOHYD MACRO 100 MG PO CAPS   Oral   Take 1 capsule (100 mg total) by mouth 2 (two) times daily.   10 capsule   0   . NOVOLIN R RELION 100 UNIT/ML IJ SOLN      INJECT 12 UNITS SUBCUTANEOUSLY THREE TIMES DAILY BEFORE MEALS   10 mL   1     Needs office visit     BP 112/74  Pulse 80  Temp 98.7 F (37.1 C) (Oral)  Resp 18  SpO2 98%  Physical Exam  Nursing note and vitals reviewed. Constitutional: She is oriented to person, place, and time. She appears well-developed and well-nourished. No distress.  HENT:  Head: Normocephalic and atraumatic.  Eyes: EOM are normal.  Neck: Normal range of motion.  Cardiovascular: Normal rate and regular rhythm.   Pulmonary/Chest: Effort normal.  Abdominal: Soft. She exhibits no distension. There is no hepatosplenomegaly. There is no tenderness. There is no rigidity, no rebound, no guarding, no CVA tenderness, no tenderness at McBurney's point  and negative Murphy's sign.  Genitourinary: No vaginal discharge found.  Musculoskeletal: Normal range of motion.  Neurological: She is alert and oriented to person, place, and time.  Skin: Skin is warm and dry.  Psychiatric: She has a normal mood and affect. Judgment normal.    ED Course  Procedures (including critical care time)  Labs Reviewed  POCT URINALYSIS DIP (DEVICE) - Abnormal; Notable for the following:    Glucose, UA 500 (*)     Nitrite POSITIVE (*)     All other components within normal limits  URINALYSIS, ROUTINE W REFLEX MICROSCOPIC - Abnormal; Notable for the following:    APPearance CLOUDY (*)     Specific Gravity, Urine 1.034 (*)     Glucose, UA >1000 (*)     All other components within normal limits  URINE MICROSCOPIC-ADD ON - Abnormal; Notable for the following:    Squamous Epithelial / LPF FEW  (*)     Bacteria, UA MANY (*)     All other components within normal limits  POCT PREGNANCY, URINE  URINE CULTURE   No results found.   1. UTI (lower urinary tract infection)       MDM  rx-macrobid, 10 Urine culture Empiric tx for trich. F/u [facility]        Evalina Field, PA 12/04/11 1443

## 2011-12-04 NOTE — ED Notes (Signed)
C/o she has had strong odor to UA for past month, and episodic syx that make her think she might have another case of BV

## 2011-12-04 NOTE — ED Provider Notes (Signed)
Medical screening examination/treatment/procedure(s) were performed by non-physician practitioner and as supervising physician I was immediately available for consultation/collaboration.  Leslee Home, M.D.   Reuben Likes, MD 12/04/11 2126

## 2011-12-06 LAB — URINE CULTURE: Colony Count: >=100000

## 2011-12-08 NOTE — ED Notes (Signed)
Urine culture: >100,000 colonies E. Coli.  Pt. adequately treated with Macrobid. Barbara Clark 12/08/2011

## 2011-12-09 ENCOUNTER — Other Ambulatory Visit: Payer: Self-pay | Admitting: Family Medicine

## 2011-12-11 ENCOUNTER — Telehealth: Payer: Self-pay | Admitting: Family Medicine

## 2011-12-11 MED ORDER — FLUCONAZOLE 150 MG PO TABS: 150.0000 mg | ORAL_TABLET | Freq: Once | ORAL | Status: DC

## 2011-12-11 NOTE — Telephone Encounter (Signed)
Pt has a yeast inf. Form some abx that she got from UC last week - would like something called in please  [facility] [address]

## 2011-12-11 NOTE — Telephone Encounter (Signed)
Patient reports she is currently taking antibiotic and developed symptoms yesterday of vaginal itching and thick white discharge. Has tried eating yogurt. Will send message to Dr. Earnest Bailey . Suggested to patient she try OTC Monistat but she states last time she had yeast ,used Monistat and still ended up coming in  Office for RX.

## 2011-12-11 NOTE — Telephone Encounter (Signed)
Please let patient know I will send  fluconazole for her.  Remind her that she is very overdue for a follow-up office visit.  Please bring glucose log to appointment

## 2011-12-11 NOTE — Telephone Encounter (Signed)
Message left on voice mail.

## 2012-01-24 ENCOUNTER — Other Ambulatory Visit: Payer: Self-pay | Admitting: Family Medicine

## 2012-02-11 ENCOUNTER — Encounter: Payer: Self-pay | Admitting: Family Medicine

## 2012-02-11 ENCOUNTER — Ambulatory Visit (INDEPENDENT_AMBULATORY_CARE_PROVIDER_SITE_OTHER): Payer: Medicaid Other | Admitting: Family Medicine

## 2012-02-11 VITALS — BP 127/85 | HR 89 | Ht 67.0 in | Wt 164.0 lb

## 2012-02-11 DIAGNOSIS — F339 Major depressive disorder, recurrent, unspecified: Secondary | ICD-10-CM

## 2012-02-11 DIAGNOSIS — E109 Type 1 diabetes mellitus without complications: Secondary | ICD-10-CM

## 2012-02-11 DIAGNOSIS — Z23 Encounter for immunization: Secondary | ICD-10-CM

## 2012-02-11 DIAGNOSIS — F17200 Nicotine dependence, unspecified, uncomplicated: Secondary | ICD-10-CM

## 2012-02-11 LAB — POCT GLYCOSYLATED HEMOGLOBIN (HGB A1C): Hemoglobin A1C: 8.7

## 2012-02-11 MED ORDER — BUPROPION HCL ER (SR) 150 MG PO TB12: 150.0000 mg | ORAL_TABLET | Freq: Two times a day (BID) | ORAL | Status: DC

## 2012-02-11 MED ORDER — NICOTINE 21 MG/24HR TD PT24: 1.0000 | MEDICATED_PATCH | TRANSDERMAL | Status: DC

## 2012-02-11 MED ORDER — INSULIN GLARGINE 100 UNIT/ML ~~LOC~~ SOLN: 46.0000 [IU] | Freq: Every day | SUBCUTANEOUS | Status: DC

## 2012-02-11 MED ORDER — INSULIN ASPART 100 UNIT/ML ~~LOC~~ SOLN: 14.0000 [IU] | Freq: Three times a day (TID) | SUBCUTANEOUS | Status: DC

## 2012-02-11 NOTE — Progress Notes (Signed)
  Subjective:    Patient ID: Barbara Clark, female    DOB: 04/28/1975, 37 y.o.   MRN: [id number]  HPI  DIABETES  Taking and tolerating: Lantus 45 QHS, Regular 13 units TID Fasting blood sugars: Hasn't been checking Hypoglycemic symptoms: no, perhaps, but not checking. Visual problems: no Monitoring feet: yes Numbness/Tingling: no Last eye exam: overdue Diabetic Labs:  Lab Results  Component Value Date   HGBA1C 8.2 09/02/2011   HGBA1C 8.3 08/19/2010   HGBA1C 9.0 05/15/2008   Lab Results  Component Value Date   LDLCALC 89 08/19/2010   CREATININE 0.50 08/13/2011   Last microalbumin: No results found for this basename: MICROALBUR, MALB24HUR   Tobacco JYN:WGNFAO- 1+ PPD since teens.  Did stop for 2 years during pregnancy. Worse during times of stress.  Had tried regular chewing gum, water,eating.  Has not seen someone professionally.  Is ready to  Quit now.  Hurdles- such as stress relief chain smokes.  Worries.  Got a job at [employer] and is hopefully will occupy her time.  If hasn't smoked for 2-3 hours, gets the shakes and gets irritable.  Has tried healthy snacks such as celery and popcorn and drinking water.    Depression;  Still sturggles much is in much better mood as she had medicaid and thinks she will be able to get a job at [employer]  I have reviewed patient's  PMH, FH, and Social history and Medications as related to this visit.      Review of Systemssee HPI   Objective:   Physical Exam  GEN: Alert & Oriented, No acute distress CV:  Regular Rate & Rhythm, no murmur Respiratory:  Normal work of breathing, CTAB Abd:  + BS, soft, no tenderness to palpation Ext: no pre-tibial edema       Assessment & Plan:

## 2012-02-11 NOTE — Assessment & Plan Note (Signed)
Will start wellbutrin and nicotine replacement as she has significant withdrawal symptoms.  Counseled on quit date- Feb 14- although she may decide to move it up a few weeks.  Wellbutrin will also help with depression.  Advised to follow-up 1-2 weeks after quit date. Given info for 1-800 quit now as well,.

## 2012-02-11 NOTE — Patient Instructions (Addendum)
Start wellbutrin- one tablet a day for 3 days, then increase to one tablet twice a day   After on it for 1 week, quit date:  Use nicotine patches- once daily, will decrease dose after 1 month  Follow-up in pharmacy clinic 1-2 weeks after quit date  1-800-quit now for free phone counseling

## 2012-02-11 NOTE — Assessment & Plan Note (Signed)
a1c increased.  Now has medicaid.  Will change insulin r to novolog, increase lantus from 45- 46 units and increase novolog to 14 untis with meals from 14 units of regular.  Will rx pens per patient preference.    Discussed with patient, above goal, but given she may have had some hypoglycemia and has not been checking blood sugars at all, I am reluctant to make large changes.  She will pick up diabetic supplies and start checking, will follow-up in 3-4 weeks

## 2012-02-11 NOTE — Addendum Note (Signed)
Addended by: Macy Mis on: 02/11/2012 12:37 PM   Modules accepted: Orders

## 2012-02-11 NOTE — Assessment & Plan Note (Signed)
Stable. Will start wellbutrin as part of tobacco cessation regimen.

## 2012-02-16 ENCOUNTER — Telehealth: Payer: Self-pay | Admitting: Family Medicine

## 2012-02-16 MED ORDER — INSULIN PEN NEEDLE 31G X 5 MM MISC: Status: DC

## 2012-02-16 NOTE — Telephone Encounter (Signed)
Pt is at [facility] and they need a script for her needles for the flex pen -

## 2012-02-25 ENCOUNTER — Encounter: Payer: Self-pay | Admitting: Pharmacist

## 2012-02-25 ENCOUNTER — Ambulatory Visit (INDEPENDENT_AMBULATORY_CARE_PROVIDER_SITE_OTHER): Payer: Medicaid Other | Admitting: Pharmacist

## 2012-02-25 VITALS — BP 120/83 | HR 89 | Ht 67.75 in | Wt 168.3 lb

## 2012-02-25 DIAGNOSIS — F17200 Nicotine dependence, unspecified, uncomplicated: Secondary | ICD-10-CM

## 2012-02-25 NOTE — Progress Notes (Signed)
Age when started using tobacco on a daily basis 19. Number of Cigarettes per day Was 20, but over past week has gone down to 3-5/day.  Estimated Nicotine Content per Cigarette (mg) 1.  Estimated Nicotine intake per day 20 mg normally, recently down to 3-5.   Smokes first cigarette at variable minutes after waking often times later in the day. Denies waking to smoke.    Most recent quit attempt a few years ago Longest time ever been tobacco free 2. What Medications (NRT, bupropion, varenicline) used in past includes buproprion started on last visit, now starting NRT patch.  Rates IMPORTANCE of quitting tobacco on 1-10 scale of 10. Rates READINESS of quitting tobacco on 1-10 scale of 7. Rates CONFIDENCE of quitting tobacco on 1-10 scale of 6. 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  She wanted to move up her quit date to today which given her recent success in decreasing her smoking is reasonable. Pt will continue buproprion and start her NRT patch today. She is starting her new job at [employer] this Monday and will focus on keeping busy to help her quit. She expressed the desire to eat healthier and is planning to eat fruits/veggies when she has cravings. Follow up scheduled with Dr. Earnest Bailey Feb 24th. Patient seen with Drue Stager PharmD, Pharmacy Resident.

## 2012-02-25 NOTE — Progress Notes (Signed)
Patient ID: Barbara Clark, female   DOB: [birth year], 37 y.o.   MRN: [id number] Reviewed and agree with Dr. Macky Lower documentation and management.

## 2012-02-25 NOTE — Assessment & Plan Note (Addendum)
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  She wanted to move up her quit date to today which given her recent success in decreasing her smoking is reasonable. Pt will continue buproprion and start her NRT patch today. She is starting her new job at [employer] this Monday and will focus on keeping busy to help her quit. She expressed the desire to eat healthier and is planning to eat fruits/veggies when she has cravings. Follow up scheduled with Dr. Earnest Bailey Feb 24th. Patient seen with Drue Stager PharmD, Pharmacy Resident.

## 2012-02-25 NOTE — Patient Instructions (Addendum)
1. Stop smoking now 2. Continue the buproprion (Wellbutrin) 3. Start using the nicotine patches now 4. Enjoy the new job and keep trying to keep yourself busy while you're getting used to being smoke-free 5. If you would like professional counseling at anytime you can call 1-800-QUIT-NOW

## 2012-03-14 ENCOUNTER — Ambulatory Visit (INDEPENDENT_AMBULATORY_CARE_PROVIDER_SITE_OTHER): Payer: Medicaid Other | Admitting: Family Medicine

## 2012-03-14 ENCOUNTER — Encounter: Payer: Self-pay | Admitting: Family Medicine

## 2012-03-14 VITALS — BP 118/76 | HR 94 | Temp 98.1°F | Ht 67.0 in | Wt 172.0 lb

## 2012-03-14 DIAGNOSIS — M79671 Pain in right foot: Secondary | ICD-10-CM

## 2012-03-14 DIAGNOSIS — E109 Type 1 diabetes mellitus without complications: Secondary | ICD-10-CM

## 2012-03-14 DIAGNOSIS — M79609 Pain in unspecified limb: Secondary | ICD-10-CM

## 2012-03-14 DIAGNOSIS — M79672 Pain in left foot: Secondary | ICD-10-CM | POA: Insufficient documentation

## 2012-03-14 NOTE — Patient Instructions (Addendum)
Continue Lantus 30 units daily  Don't skip any meals- ok to have a small snack before bedtime to prevent low blood sugars overnight  Will get you an appointment with diabetes nutrition counselor  Buy some supportive athletic shoes- make sure it gives you enough support and isnt too tight in the foot or toes  Someone from sports medicine will call with an appointment - bring your shoes with you  Follow-up in 1-2 months

## 2012-03-14 NOTE — Assessment & Plan Note (Signed)
Labile CBG due to erratic eating schedules.  Discussed bringing snacks to work and bedtime snack to rpevent hypoglycemia.  HAve backed off lantus for now, but oevrall still elevated a1c.  WIll refer to DM education, follow-up with 1-2 months and hopefully will be able to titrate insulin back up without fear of hypoglycemia.

## 2012-03-14 NOTE — Assessment & Plan Note (Signed)
Bilateral foot pain with evidence of shoes rubbing on pressure points on her foot.  Concern due to poorly controlled diabetes.  Asked her to consider buying supportive athletic shoes, will refer to sports medicines for further evaluation for need for orthotics for support.

## 2012-03-14 NOTE — Progress Notes (Signed)
  Subjective:    Patient ID: Barbara Clark, female    DOB: Jul 09, 1975, 37 y.o.   MRN: [id number]  HPI Here for follow-up of DM, eval of blisters  DIABETES  Taking and tolerating:reduced her lantis dose to 30 from 46.   Fasting blood sugars:;abile- between 50's - 300's.  Lows are when she skips dinner or all day meals Hypoglycemic symptom: yes Visual problems: no Monitoring feet: yes Numbness/Tingling: no Last eye exam: has to reschedule appt Diabetic Labs:  Lab Results  Component Value Date   HGBA1C 8.7 02/11/2012   HGBA1C 8.2 09/02/2011   HGBA1C 8.3 08/19/2010   Lab Results  Component Value Date   LDLCALC 89 08/19/2010   CREATININE 0.50 08/13/2011   Last microalbumin: No results found for this basename: MICROALBUR, MALB24HUR   Foot pain;  Bilateral foot pain after starting new job where she is on her feet daily.  Wears clogs which she did not bring today.  No open sores    Review of Systems See HPI    Objective:   Physical Exam GEN: Alert & Oriented, No acute distress CV:  Regular Rate & Rhythm, no murmur Respiratory:  Normal work of breathing, CTAB Abd:  + BS, soft, no tenderness to palpation Ext: no pre-tibial edema Feet: monofilament testing wnl.   Area of tenderness on pressure points bilaterally.  Abrasion on great toe bilaterally.       Assessment & Plan:

## 2012-03-16 ENCOUNTER — Telehealth: Payer: Self-pay | Admitting: Family Medicine

## 2012-03-16 NOTE — Telephone Encounter (Signed)
Spoke with Barbara Clark, pt is to call [phone] # on back of machine.  I will inform tomorrow. Fleeger, Maryjo Rochester

## 2012-03-16 NOTE — Telephone Encounter (Signed)
The Accu check [employer] meter that the patient has, had a piece to break off and she didn't if it can be fixed or if she needs to get a new meter all together.

## 2012-03-17 NOTE — Telephone Encounter (Signed)
LM on identifiable VM informing her to call the 1-800 #. Fleeger, Maryjo Rochester

## 2012-03-23 ENCOUNTER — Ambulatory Visit (INDEPENDENT_AMBULATORY_CARE_PROVIDER_SITE_OTHER): Payer: Medicaid Other | Admitting: Sports Medicine

## 2012-03-23 ENCOUNTER — Encounter: Payer: Self-pay | Admitting: Sports Medicine

## 2012-03-23 VITALS — BP 119/76 | HR 93 | Ht 67.0 in | Wt 172.0 lb

## 2012-03-23 DIAGNOSIS — M79671 Pain in right foot: Secondary | ICD-10-CM

## 2012-03-23 DIAGNOSIS — M79672 Pain in left foot: Secondary | ICD-10-CM

## 2012-03-23 DIAGNOSIS — M79609 Pain in unspecified limb: Secondary | ICD-10-CM

## 2012-03-23 NOTE — Progress Notes (Signed)
  Subjective:    Patient ID: Barbara Clark, female    DOB: [birth year], 37 y.o.   MRN: [id number]  HPI chief complaint: Bilateral feet pain  37 year old diabetic female comes in today complaining of bilateral foot pain. She has recently started a new job at [employer] which requires her to stand for long periods of time. Since starting on February 16 she has noticed increasing pain diffusely in her feet. She has also developed blisters although she admits that the blisters have improved. She is also complaining of numbness and tingling which is worse at night. Her diabetes is poorly controlled. She was referred to [place] by her primary care physician in the hopes that we may be able to get her some inserts. She denies significant problems with her feet in the past.  Medical history and medications are reviewed No known drug allergies Socially she smokes a pack of cigarettes a day, no alcohol use, working at [employer]     Review of Systems     Objective:   Physical Exam Well-developed, well-nourished. No acute distress. Awake alert and oriented x3  Examination of each of her feet in the standing position shows collapse of both the longitudinal and transverse arches. No soft tissue swelling. No obvious blister formation. Patient is diffusely tender to palpation along the dorsum and plantar aspect of the foot. Also some tenderness along the medial great toes bilaterally. Sensation is intact to light-touch. 2. discrimination is intact. Brisk capillary refill. Good color. Good dorsalis pedis and posterior tibial pulses. Patient walks without significant limp.      Assessment & Plan:  1. 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 Uncontrolled diabetes  Although the patient is diabetic she does appear to still have fairly good sensation in her feet. Therefore, we will try a green sports insole with a scaphoid pad. We may need to add a metatarsal pad later but I do not want  to add too much padding initially. I've asked her to buy some tennis shoes with a wider toe box. She will followup with me in 3 weeks. The symptoms she is experiencing at night are likely due to diabetic neuropathy and I've explained to her that the only real treatment for this at this point in time is better control of her underlying diabetes.

## 2012-03-30 ENCOUNTER — Encounter: Payer: Medicaid Other | Attending: Family Medicine | Admitting: *Deleted

## 2012-03-30 ENCOUNTER — Encounter: Payer: Self-pay | Admitting: *Deleted

## 2012-03-30 VITALS — Ht 67.0 in | Wt 173.9 lb

## 2012-03-30 DIAGNOSIS — E109 Type 1 diabetes mellitus without complications: Secondary | ICD-10-CM | POA: Insufficient documentation

## 2012-03-30 DIAGNOSIS — Z713 Dietary counseling and surveillance: Secondary | ICD-10-CM | POA: Insufficient documentation

## 2012-03-30 NOTE — Patient Instructions (Signed)
Plan:  Consider paying more attention to foods that contain carbohydrate so you can be more aware of the amounts you are eating or drinking We will discuss amounts of Carb to aim for at our next meeting. Consider reading food labels for Total Carbohydrate of foods Continue checking BG before meals each day as directed by MD  Consider taking medication Lantus at 7 PM each night so it can be a more consistent time to take it daily

## 2012-03-30 NOTE — Progress Notes (Signed)
  Medical Nutrition Therapy:  Appt start time: 1200 end time:  1300.  Assessment:  Primary concerns today: patient here for diabetes and carb counting. Lives with 2 of 3 children, 75 & 35 year old. Just started working at [employer], hours vary from week to week, on feet a lot. Just bought better pair of shoes for support. She does her own food shopping and preparation. Does not understand carb counting yet. SMBG 4 times or more a day and takes insulin pre meals and if she feels bad, but if not eating, states she is not taking any.  MEDICATIONS: see list   DIETARY INTAKE:  Usual eating pattern includes 1-3 meals and 1-3 snacks per day.  Everyday foods include fair variety of all food groups.  Avoided foods include pork, soda.    24-hr recall:  B ( AM): skips often, left overs OR eggs, bacon OR sandwich OR sweet cereal, whole or 2% milk, decaf coffee with sweetener and creamer Snk ( AM): occasionally PNB, fruit, yogurt, veggies or anything  L ( PM): sonic food: burger, chic sandwich or fried peppers or fries etc., water and mixed sweet tea with water Snk ( PM): same as AM D ( PM): cooks or kids cook: chicken, steak, ribs, with starches, vegetables, water with Crystal light Snk ( PM): popcorn or PNB crackers or celery with PNB and jelly Beverages: water, milk, diluted sweet tea, crystal light  Usual physical activity: walks a lot, doesn't have a car  Estimated energy needs: 1500 calories 170 g carbohydrates 112 g protein 42 g fat  Progress Towards Goal(s):  In progress.   Nutritional Diagnosis:  NB-1.1 Food and nutrition-related knowledge deficit As related to diabetes management.  As evidenced by A1c of 8.7% on 02/17/12.    Intervention:  Nutrition counseling and diabetes education initiated. Discussed basic physiology of diabetes, SMBG and rationale of checking BG at alternate times of day, A1c, Carb Counting and reading food labels, and benefits of increased activity. Also discussed  insulin action and the importance of taking Lantus at more consistent time each night. Mentioned potential of moving towards an intensive insulin regime where she could adjust insulin dose based on carb intake as well as BG reading.   Plan:  Consider paying more attention to foods that contain carbohydrate so you can be more aware of the amounts you are eating or drinking We will discuss amounts of Carb to aim for at our next meeting. Consider reading food labels for Total Carbohydrate of foods Continue checking BG before meals each day as directed by MD  Consider taking medication Lantus at 7 PM each night so it can be a more consistent time to take it daily  Handouts given during visit include: Living Well with Diabetes Carb Counting and Food Label handouts Meal Plan Card  Monitoring/Evaluation:  Dietary intake, exercise, SMBG, and body weight in 4 week(s).

## 2012-04-04 ENCOUNTER — Encounter: Payer: Self-pay | Admitting: *Deleted

## 2012-04-12 ENCOUNTER — Encounter: Payer: Self-pay | Admitting: Family Medicine

## 2012-04-12 ENCOUNTER — Ambulatory Visit (INDEPENDENT_AMBULATORY_CARE_PROVIDER_SITE_OTHER): Payer: Medicaid Other | Admitting: Family Medicine

## 2012-04-12 ENCOUNTER — Other Ambulatory Visit (HOSPITAL_COMMUNITY)
Admission: RE | Admit: 2012-04-12 | Discharge: 2012-04-12 | Disposition: A | Discharge status: Home / Self Care | Payer: Medicaid Other | Source: Ambulatory Visit | Attending: Family Medicine | Admitting: Family Medicine

## 2012-04-12 VITALS — BP 110/75 | HR 80 | Temp 98.6°F | Ht 67.0 in | Wt 171.9 lb

## 2012-04-12 DIAGNOSIS — N898 Other specified noninflammatory disorders of vagina: Secondary | ICD-10-CM | POA: Insufficient documentation

## 2012-04-12 DIAGNOSIS — Z7251 High risk heterosexual behavior: Secondary | ICD-10-CM | POA: Insufficient documentation

## 2012-04-12 DIAGNOSIS — Z113 Encounter for screening for infections with a predominantly sexual mode of transmission: Secondary | ICD-10-CM | POA: Insufficient documentation

## 2012-04-12 DIAGNOSIS — N760 Acute vaginitis: Secondary | ICD-10-CM

## 2012-04-12 LAB — POCT WET PREP (WET MOUNT): Clue Cells Wet Prep Whiff POC: POSITIVE

## 2012-04-12 MED ORDER — METRONIDAZOLE 500 MG PO TABS: 500.0000 mg | ORAL_TABLET | Freq: Two times a day (BID) | ORAL | Status: DC

## 2012-04-12 MED ORDER — FLUCONAZOLE 150 MG PO TABS: 150.0000 mg | ORAL_TABLET | Freq: Once | ORAL | Status: DC

## 2012-04-12 NOTE — Patient Instructions (Addendum)
Will call you with results and send normal results in the mail  Will check HIV in 6 weeks

## 2012-04-12 NOTE — Progress Notes (Signed)
  Subjective:    Patient ID: Barbara Clark, female    DOB: 02-23-1975, 37 y.o.   MRN: [id number]  HPI 37 yo here for evaluation of vaginal discharge and condom break last night  Known intermittent partner- no known STD exposure.  Has tubal for contraception.  States vaginal discharge itchy, like yeast infection  On menses Review of Systems See HPI    Objective:   Physical Exam GEN: NAD Pelvic Exam:On menses        External: normal female genitalia without lesions or masses        Vagina: normal without lesions or masses        Cervix: normal without lesions or masses        Samples for Wet prep, GC/Chlamydia obtained        Assessment & Plan:

## 2012-04-12 NOTE — Assessment & Plan Note (Signed)
Known partner, no known STD exposure.  Will check GC/Chlamydia, Wet prep today.  Patient declines baseline HIV, had neg in 2012.  Advised to have partner screened at health department, will return to get HIV checked in 6 weeks.

## 2012-04-13 ENCOUNTER — Encounter: Payer: Self-pay | Admitting: Family Medicine

## 2012-04-20 ENCOUNTER — Ambulatory Visit (INDEPENDENT_AMBULATORY_CARE_PROVIDER_SITE_OTHER): Payer: Medicaid Other | Admitting: Sports Medicine

## 2012-04-20 VITALS — BP 120/78 | Ht 67.0 in | Wt 164.0 lb

## 2012-04-20 DIAGNOSIS — M2140 Flat foot [pes planus] (acquired), unspecified foot: Secondary | ICD-10-CM

## 2012-04-20 NOTE — Progress Notes (Signed)
  Subjective:    Patient ID: Barbara Clark, female    DOB: October 05, 1975, 37 y.o.   MRN: [id number]  HPI Patient comes in today for followup on bilateral foot pain. She is doing much better. She has obtained a pair of combat boots which seem to be more comfortable to her. He is wearing her green sports insoles with scaphoid pads in the boots. The blistering that she was getting previously has resolved. Her nighttime pain has also improved.    Review of Systems     Objective:   Physical Exam Well-developed, well-nourished  Examination of each of her feet shows no soft tissue swelling. There is no bony or soft tissue tenderness to direct palpation. No pain with metatarsal squeeze. Neurovascularly intact distally.       Assessment & Plan:  1. Improved bilateral foot pain secondary to pes planus and transverse arch collapse  Patient will continue with her green sports insoles with scaphoid pads. At some point down the road she may benefit from a custom orthotic. Although she does have collapse of the transverse arch she seems to be very comfortable in her current sports insole with scaphoid pads. Therefore, I will not place a metatarsal pad at this time. I can do this at a later date if she develops metatarsalgia. Can you with activity as tolerated and followup when necessary.

## 2012-04-25 ENCOUNTER — Encounter: Payer: Self-pay | Admitting: *Deleted

## 2012-04-25 ENCOUNTER — Encounter: Payer: Medicaid Other | Attending: Family Medicine | Admitting: *Deleted

## 2012-04-25 VITALS — Ht 67.0 in | Wt 170.7 lb

## 2012-04-25 DIAGNOSIS — Z713 Dietary counseling and surveillance: Secondary | ICD-10-CM | POA: Insufficient documentation

## 2012-04-25 DIAGNOSIS — E109 Type 1 diabetes mellitus without complications: Secondary | ICD-10-CM | POA: Insufficient documentation

## 2012-04-25 NOTE — Progress Notes (Signed)
Medical Nutrition Therapy:  Appt start time: 1115end time:  1215.  Assessment:  Primary concerns today: patient here for diabetes and carb counting follow up visit. States she is more comfortable with Carb Counting now, is taking Lantus in AM now around 7 AM more consistently. Not able to SMBG lately stating her lancing device is broken. Has incorporated taking less Novolog if not eating enough carbs at that meal, for example in only 2 Carb choices she takes 8 units instead of 13 for the meal.  MEDICATIONS: see list   DIETARY INTAKE:  Usual eating pattern includes 1-3 meals and 1-3 snacks per day.  Everyday foods include fair variety of all food groups.  Avoided foods include pork, soda.    24-hr recall:  B ( AM): skips often, left overs OR eggs, bacon OR sandwich OR sweet cereal, whole or 2% milk, decaf coffee with sweetener and creamer Snk ( AM): occasionally PNB, fruit, yogurt, veggies or anything  L ( PM): sonic food: burger, chic sandwich or fried peppers, no more fries, water and mixed sweet tea diluted with water Snk ( PM): same as AM D ( PM): cooks or kids cook: chicken, steak, ribs, with starches, vegetables, water with Crystal light Snk ( PM): popcorn or PNB crackers or celery with PNB and jelly Beverages: water, milk, diluted sweet tea, crystal light  Usual physical activity: walks a lot, doesn't have a car  Estimated energy needs: 1500 calories 170 g carbohydrates 112 g protein 42 g fat  Progress Towards Goal(s):  In progress.   Nutritional Diagnosis:  NB-1.1 Food and nutrition-related knowledge deficit As related to diabetes management.  As evidenced by A1c of 8.7% on 02/17/12.    Intervention:  Commended her on her improved eating habits, less concentrated sweets and also taking the initiative to decrease her Novolog insulin based on a lower carb meal! Discussed concept of intensive insulin further and explained that based on her current food intake and insulin dose  she could consider a Carb ratio of 4 units Novolog for each Carb choice (= 15 grams each) that she eats. She expressed good understanding, motivation and interest in implementing that in order to improve her control. Also discussed possibility of insulin pump. She was interested in learning more about pumps so I demonstrated a pump, infusion set, reservoir and overview of changing out a pump every 3 days. Also discussed increased accuracy of insulin delivery and how infusion set would be inserted by herself. She thought a pump would have to be surgically implanted, was relieved to find out it was self inserted so very easily. Suggested she discuss with her MD whenever she felt ready to. 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   Plan:  Continue paying more attention to foods that contain carbohydrate  Consider aiming for 3 Carb choices (45 grams) per meal, +/- 1 either way to provide a consistent carb intake Continue reading food labels for Total Carbohydrate of foods Continue checking BG before meals each day as directed by MD  Continue taking Lantus at 7 PM each night at a more consistent time daily Consider adjusting Novolog insulin at meal time at 4 units per each Carb Choice to better match insulin to carb intake and improve your BG control.   Handouts given during visit include: Reviewed Carb Counting and Food Label handouts New Lancing device and lancets  Monitoring/Evaluation:  Dietary intake, exercise, SMBG,  and body weight in 4 week(s).

## 2012-04-29 ENCOUNTER — Encounter: Payer: Self-pay | Admitting: *Deleted

## 2012-04-29 NOTE — Patient Instructions (Signed)
Plan:  Continue paying more attention to foods that contain carbohydrate  Consider aiming for 3 Carb choices (45 grams) per meal, +/- 1 either way to provide a consistent carb intake Continue reading food labels for Total Carbohydrate of foods Continue checking BG before meals each day as directed by MD  Continue taking Lantus at 7 PM each night at a more consistent time daily Consider adjusting Novolog insulin at meal time at 4 units per each Carb Choice to better match insulin to carb intake and improve your BG control.

## 2012-05-24 ENCOUNTER — Encounter: Payer: Medicaid Other | Attending: Family Medicine | Admitting: *Deleted

## 2012-05-24 ENCOUNTER — Encounter: Payer: Self-pay | Admitting: *Deleted

## 2012-05-24 VITALS — Ht 67.0 in | Wt 172.4 lb

## 2012-05-24 DIAGNOSIS — Z713 Dietary counseling and surveillance: Secondary | ICD-10-CM | POA: Insufficient documentation

## 2012-05-24 DIAGNOSIS — E109 Type 1 diabetes mellitus without complications: Secondary | ICD-10-CM | POA: Insufficient documentation

## 2012-05-24 NOTE — Progress Notes (Signed)
Medical Nutrition Therapy:  Appt start time: 1115end time:  1145.  Assessment:  Primary concerns today: patient here for diabetes and carb counting follow up visit.  She shared with me today that she has a lot of stress in her life that is affecting her BG management. Prior to some family issues occuring the last couple of weeks, she states she was starting to get some BGs under 200 mg/dl. Not taking any correction insulin right now, stating she does not have a sliding scale. She has continued modifying her insulin dose based on 4 units for each Carb Choice (15 grams). Upon review, she discovered she has not been counting her fruit intake as a Carb choice, which will have impacted her BG control. 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 That will start in the next couple of weeks. She did state that she had been taking her Lantus in the morning but if she doesn't remember, she moves it to the afternoon. She continues to be interested in pursuing an insulin pump.  MEDICATIONS: see list   DIETARY INTAKE:  Usual eating pattern includes 1-3 meals and 1-3 snacks per day.  Everyday foods include fair variety of all food groups.  Avoided foods include pork, soda.    24-hr recall:  B ( AM): skips often, left overs OR eggs, bacon OR sandwich OR sweet cereal, whole or 2% milk, decaf coffee with sweetener and creamer Snk ( AM): occasionally PNB, fruit, yogurt, veggies or anything  L ( PM): sonic food: burger, chic sandwich or fried peppers, no more fries, water and mixed sweet tea diluted with water Snk ( PM): same as AM D ( PM): cooks or kids cook: chicken, steak, ribs, with starches, vegetables, water with Crystal light Snk ( PM): popcorn or PNB crackers or celery with PNB and jelly Beverages: water, milk, diluted sweet tea, crystal light  Usual physical activity: walks a lot, doesn't have a car  Estimated energy needs: 1500 calories 170 g carbohydrates 112 g  protein 42 g fat  Progress Towards Goal(s):  In progress.   Nutritional Diagnosis:  NB-1.1 Food and nutrition-related knowledge deficit As related to diabetes management.  As evidenced by A1c of 8.7% on 02/17/12.    Intervention: Commended her on her continued decision-making about Novolog dose relating to Carb intake. Acknowledged with her that when see has extra stress in her life, it will affect her BG management and that she should not feel guilty about that. Then discussed the potential of having a Sliding Scale to provide a correction dose so BG's don't have to remain high in those circumstances. Upon the realization that she wasn't counting her fruit as part of her Carb choices, we reviewed Carb Counting again to reinforce those decisions. Suggested she speak with her MD regarding the insulin pump and the implementation of a Sliding Scale at her next visit.  Plan:  Continue paying more attention to foods that contain carbohydrate  Continue aiming for 3 Carb choices (45 grams) per meal, +/- 1 either way to provide a consistent carb intake Continue reading food labels for Total Carbohydrate of foods Continue checking BG before meals each day as directed by MD and record in your new book or Log Sheet for MD Continue taking Lantus at 10 PM each night at a more consistent time daily Continue adjusting Novolog insulin at meal time at 4 units per each Carb Choice to better match insulin to carb intake  and improve your BG control. Start including fruits as Carb choices  Talk to your MD about:  Possibility of moving towards insulin pump? Use of Sliding Scale to be able to correct high BGs   Handouts given during visit include: Reviewed Carb Counting and Food Label handouts Yellow Meal Plan Card  Monitoring/Evaluation:  Dietary intake, exercise, SMBG, and body weight after her next MD appointment so if pump is approved, we can contact the pump company to start the process.

## 2012-05-24 NOTE — Patient Instructions (Addendum)
Plan:  Continue paying more attention to foods that contain carbohydrate  Continue aiming for 3 Carb choices (45 grams) per meal, +/- 1 either way to provide a consistent carb intake Continue reading food labels for Total Carbohydrate of foods Continue checking BG before meals each day as directed by MD and record in your new book or Log Sheet for MD Continue taking Lantus at 10 PM each night at a more consistent time daily Continue adjusting Novolog insulin at meal time at 4 units per each Carb Choice to better match insulin to carb intake and improve your BG control. Start including fruits as Carb choices  Talk to your MD about:  Possibility of moving towards insulin pump? Use of Sliding Scale to be able to correct high BGs

## 2012-07-28 ENCOUNTER — Other Ambulatory Visit: Payer: Self-pay

## 2012-07-29 ENCOUNTER — Emergency Department (HOSPITAL_COMMUNITY): Payer: Worker's Compensation

## 2012-07-29 ENCOUNTER — Encounter (HOSPITAL_COMMUNITY): Payer: Self-pay

## 2012-07-29 ENCOUNTER — Emergency Department (HOSPITAL_COMMUNITY)
Admission: EM | Admit: 2012-07-29 | Discharge: 2012-07-29 | Disposition: A | Discharge status: Home / Self Care | Payer: Worker's Compensation | Attending: Emergency Medicine | Admitting: Emergency Medicine

## 2012-07-29 DIAGNOSIS — Z8659 Personal history of other mental and behavioral disorders: Secondary | ICD-10-CM | POA: Insufficient documentation

## 2012-07-29 DIAGNOSIS — Z794 Long term (current) use of insulin: Secondary | ICD-10-CM | POA: Insufficient documentation

## 2012-07-29 DIAGNOSIS — IMO0002 Reserved for concepts with insufficient information to code with codable children: Secondary | ICD-10-CM | POA: Insufficient documentation

## 2012-07-29 DIAGNOSIS — Z8619 Personal history of other infectious and parasitic diseases: Secondary | ICD-10-CM | POA: Insufficient documentation

## 2012-07-29 DIAGNOSIS — S199XXA Unspecified injury of neck, initial encounter: Secondary | ICD-10-CM | POA: Insufficient documentation

## 2012-07-29 DIAGNOSIS — Y9389 Activity, other specified: Secondary | ICD-10-CM | POA: Insufficient documentation

## 2012-07-29 DIAGNOSIS — Y990 Civilian activity done for income or pay: Secondary | ICD-10-CM | POA: Insufficient documentation

## 2012-07-29 DIAGNOSIS — W19XXXA Unspecified fall, initial encounter: Secondary | ICD-10-CM

## 2012-07-29 DIAGNOSIS — M549 Dorsalgia, unspecified: Secondary | ICD-10-CM

## 2012-07-29 DIAGNOSIS — F17200 Nicotine dependence, unspecified, uncomplicated: Secondary | ICD-10-CM | POA: Insufficient documentation

## 2012-07-29 DIAGNOSIS — Y9210 Unspecified residential institution as the place of occurrence of the external cause: Secondary | ICD-10-CM | POA: Insufficient documentation

## 2012-07-29 DIAGNOSIS — S298XXA Other specified injuries of thorax, initial encounter: Secondary | ICD-10-CM | POA: Insufficient documentation

## 2012-07-29 DIAGNOSIS — E119 Type 2 diabetes mellitus without complications: Secondary | ICD-10-CM | POA: Insufficient documentation

## 2012-07-29 DIAGNOSIS — S0993XA Unspecified injury of face, initial encounter: Secondary | ICD-10-CM | POA: Insufficient documentation

## 2012-07-29 DIAGNOSIS — R0602 Shortness of breath: Secondary | ICD-10-CM | POA: Insufficient documentation

## 2012-07-29 DIAGNOSIS — T148XXA Other injury of unspecified body region, initial encounter: Secondary | ICD-10-CM

## 2012-07-29 MED ORDER — NAPROXEN 500 MG PO TABS: 500.0000 mg | ORAL_TABLET | Freq: Two times a day (BID) | ORAL | Status: DC

## 2012-07-29 MED ORDER — CYCLOBENZAPRINE HCL 10 MG PO TABS: 10.0000 mg | ORAL_TABLET | Freq: Three times a day (TID) | ORAL | Status: DC | PRN

## 2012-07-29 MED ORDER — IBUPROFEN 400 MG PO TABS
800.0000 mg | ORAL_TABLET | Freq: Once | ORAL | Status: AC
  Administered 2012-07-29: 800 mg via ORAL
  Filled 2012-07-29: qty 2
  Filled 2012-07-29: qty 1

## 2012-07-29 NOTE — ED Provider Notes (Signed)
History    CSN: [id number] Arrival date & time 07/29/12  2153  First MD Initiated Contact with Patient 07/29/12 2211     Chief Complaint  Patient presents with  . Back Pain   HPI  History provided by the patient. The patient is a 37 year old female with history of diabetes presenting with work-related injury with chest and back pain. 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 Patient was positioned behind the woman but was unable to keep her from falling. The woman weighed near 200 pounds and fell directly on top of the patient to the floor. Patient complains of mid back pain as well as some chest discomforts. She reported initial loss of breath and shortness of breath but has since improved. Denies any shortness of breath at this time. She denied any significant head injury to the floor and no LOC. Denies any neck pains. No weakness or numbness in extremities. No urinary fecal incontinence. The patient is not using treatments for her symptoms. She did drive herself to the emergency room. Denies any other aggravating or alleviating factors. No other associated symptoms.     Past Medical History  Diagnosis Date  . Diabetes mellitus     Type I  . Depression   . Anxiety   . HSV-2 (herpes simplex virus 2) infection 2012    serology   Past Surgical History  Procedure Laterality Date  . Tubal ligation     Family History  Problem Relation Age of Onset  . Diabetes Mother   . Hypertension Mother   . Diabetes Maternal Grandmother    History  Substance Use Topics  . Smoking status: Current Some Day Smoker -- 1.00 packs/day    Types: Cigarettes  . Smokeless tobacco: Never Used     Comment: trying to quit  . Alcohol Use: No   OB History   Grav Para Term Preterm Abortions TAB SAB Ect Mult Living                 Review of Systems  HENT: Negative for neck pain.   Respiratory: Negative  for shortness of breath.   Cardiovascular: Positive for chest pain.  Gastrointestinal: Negative for abdominal pain.  Musculoskeletal: Positive for back pain.  Neurological: Negative for weakness, numbness and headaches.    Allergies  Review of patient's allergies indicates no known allergies.  Home Medications   Current Outpatient Rx  Name  Route  Sig  Dispense  Refill  . insulin aspart (NOVOLOG) 100 UNIT/ML injection   Subcutaneous   Inject 13 Units into the skin 3 (three) times daily before meals.         . insulin glargine (LANTUS) 100 UNIT/ML injection   Subcutaneous   Inject 30 Units into the skin at bedtime.         Marland Kitchen glucose blood (ACCU-CHEK AVIVA PLUS) test strip      Check 4 times a day and as needed   100 each   12   . Insulin Pen Needle (B-D UF III MINI PEN NEEDLES) 31G X 5 MM MISC      To be used with novolog flexpen and lantus solostar.  Can substitute per formulary as compatible.   100 each   5    BP 121/70  Pulse 90  Temp(Src) 98.1 F (36.7 C) (Oral)  Resp 16  SpO2 99% Physical Exam  Nursing note  and vitals reviewed. Constitutional: She is oriented to person, place, and time. She appears well-developed and well-nourished. No distress.  HENT:  Head: Normocephalic.  Neck: Normal range of motion. Neck supple.  Mild discomforts with movements of the head and neck but otherwise full range of motion. There is no significant cervical midline tenderness.  Cardiovascular: Normal rate and regular rhythm.   Pulmonary/Chest: Effort normal and breath sounds normal. No respiratory distress. She has no wheezes. She has no rales.  Abdominal: Soft. There is no tenderness.  Musculoskeletal:       Cervical back: Normal.       Thoracic back: She exhibits tenderness. She exhibits no deformity.       Lumbar back: Normal.       Back:  Neurological: She is alert and oriented to person, place, and time.  Skin: Skin is warm and dry. No rash noted.  Psychiatric: She  has a normal mood and affect. Her behavior is normal.    ED Course  Procedures    Dg Chest 2 View  07/29/2012   *RADIOLOGY REPORT*  Clinical Data: Pain in the chest and sternum after fall.  CHEST - 2 VIEW  Comparison: 09/20/2008  Findings: The heart size and pulmonary vascularity are normal. The lungs appear clear and expanded without focal air space disease or consolidation. No blunting of the costophrenic angles.  No pneumothorax.  Mediastinal contours appear intact.  Sternum does not appear depressed. Visualized ribs do not appear displaced. Probable old fracture of the right anterior fourth rib.  No significant change since previous study.  IMPRESSION: No evidence of active pulmonary disease.   Original Report Authenticated By: Burman Nieves, M.D.   Dg Cervical Spine Complete  07/29/2012   *RADIOLOGY REPORT*  Clinical Data: Neck pain after fall.  CERVICAL SPINE - COMPLETE 4+ VIEW  Comparison: 08/08/2005  Findings: Straightening of the usual cervical lordosis which is likely due to patient positioning and stable since previous study. Ligamentous injury or muscle spasm can also have this appearance. No vertebral compression deformities.  Intervertebral disc space heights are preserved.  No prevertebral soft tissue swelling.  No focal bone lesion or bone destruction.  Bone cortex and trabecular architecture appear intact.  No bony encroachment upon the neural foramina.  Facet joints appear well-aligned.  Lateral masses of C1 appear symmetrical.  The odontoid process appears intact.  No significant change since previous study.  IMPRESSION: Nonspecific straightening of the usual cervical lordosis.  No displaced cervical fractures identified.   Original Report Authenticated By: Burman Nieves, M.D.   Dg Thoracic Spine 2 View  07/29/2012   *RADIOLOGY REPORT*  Clinical Data: Back pain after fall.  THORACIC SPINE - 2 VIEW  Comparison: Chest 09/20/2008  Findings: Normal alignment of the thoracic  vertebrae.  No vertebral compression deformities.  Intervertebral disc space heights are preserved.  No focal bone lesion or bone destruction.  Bone cortex and trabecular architecture appear intact.  No paraspinal soft tissue swelling.  IMPRESSION: No displaced fractures identified in the thoracic spine.   Original Report Authenticated By: Burman Nieves, M.D.      1. Fall, initial encounter   2. Back pain   3. Muscle strain     MDM  10:15 PM patient seen and evaluated. The patient sitting upright in chair does not appear in significant discomfort in no acute distress. Normal respirations and O2 sats.  Angus Seller, PA-C 07/29/12 2352

## 2012-07-29 NOTE — ED Notes (Signed)
Pt presents with injury from work. A patient fell on top her and she landed on her back. Now complains of thoracic back pain and chest pain described as sharp and sore. Denies SOB. Sats 100% ra

## 2012-07-30 NOTE — ED Provider Notes (Signed)
Medical screening examination/treatment/procedure(s) were performed by non-physician practitioner and as supervising physician I was immediately available for consultation/collaboration.  Rickiya Picariello M Chalee Hirota, MD 07/30/12 0045 

## 2012-08-26 ENCOUNTER — Telehealth: Payer: Self-pay | Admitting: Family Medicine

## 2012-08-26 NOTE — Telephone Encounter (Signed)
NPI auth for Nutrition appt on 05/24/12, for Diabeties education and training, ok'd by Clint Guy, RN.

## 2012-09-08 ENCOUNTER — Other Ambulatory Visit: Payer: Self-pay | Admitting: Occupational Medicine

## 2012-09-08 ENCOUNTER — Ambulatory Visit: Payer: Worker's Compensation

## 2012-09-08 DIAGNOSIS — M549 Dorsalgia, unspecified: Secondary | ICD-10-CM

## 2012-11-20 ENCOUNTER — Encounter (HOSPITAL_COMMUNITY): Payer: Self-pay | Admitting: Emergency Medicine

## 2012-11-20 ENCOUNTER — Emergency Department (HOSPITAL_COMMUNITY): Payer: Medicaid Other

## 2012-11-20 ENCOUNTER — Other Ambulatory Visit (HOSPITAL_COMMUNITY): Payer: Self-pay

## 2012-11-20 ENCOUNTER — Inpatient Hospital Stay (HOSPITAL_COMMUNITY)
Admission: EM | Admit: 2012-11-20 | Discharge: 2012-11-26 | DRG: 880 | Disposition: A | Discharge status: Skilled Nursing Facility | Payer: Medicaid Other | Attending: Family Medicine | Admitting: Family Medicine

## 2012-11-20 DIAGNOSIS — Z794 Long term (current) use of insulin: Secondary | ICD-10-CM

## 2012-11-20 DIAGNOSIS — I959 Hypotension, unspecified: Secondary | ICD-10-CM | POA: Diagnosis present

## 2012-11-20 DIAGNOSIS — F411 Generalized anxiety disorder: Secondary | ICD-10-CM | POA: Diagnosis present

## 2012-11-20 DIAGNOSIS — F339 Major depressive disorder, recurrent, unspecified: Secondary | ICD-10-CM | POA: Diagnosis present

## 2012-11-20 DIAGNOSIS — E860 Dehydration: Secondary | ICD-10-CM

## 2012-11-20 DIAGNOSIS — E109 Type 1 diabetes mellitus without complications: Secondary | ICD-10-CM | POA: Diagnosis present

## 2012-11-20 DIAGNOSIS — R414 Neurologic neglect syndrome: Secondary | ICD-10-CM | POA: Diagnosis present

## 2012-11-20 DIAGNOSIS — F430 Acute stress reaction: Principal | ICD-10-CM | POA: Diagnosis present

## 2012-11-20 DIAGNOSIS — L293 Anogenital pruritus, unspecified: Secondary | ICD-10-CM | POA: Diagnosis present

## 2012-11-20 DIAGNOSIS — N898 Other specified noninflammatory disorders of vagina: Secondary | ICD-10-CM

## 2012-11-20 DIAGNOSIS — D72829 Elevated white blood cell count, unspecified: Secondary | ICD-10-CM | POA: Diagnosis present

## 2012-11-20 DIAGNOSIS — L299 Pruritus, unspecified: Secondary | ICD-10-CM

## 2012-11-20 DIAGNOSIS — F17200 Nicotine dependence, unspecified, uncomplicated: Secondary | ICD-10-CM | POA: Diagnosis present

## 2012-11-20 DIAGNOSIS — F8081 Childhood onset fluency disorder: Secondary | ICD-10-CM

## 2012-11-20 DIAGNOSIS — R269 Unspecified abnormalities of gait and mobility: Secondary | ICD-10-CM

## 2012-11-20 LAB — URINALYSIS, ROUTINE W REFLEX MICROSCOPIC
Bilirubin Urine: NEGATIVE
Glucose, UA: >1000 mg/dL — AB
Hgb urine dipstick: NEGATIVE
Ketones, ur: NEGATIVE mg/dL
Leukocytes, UA: NEGATIVE
Nitrite: NEGATIVE
Protein, ur: NEGATIVE mg/dL
Specific Gravity, Urine: 1.029 (ref 1.005–1.030)
Urobilinogen, UA: 0.2 mg/dL (ref 0.0–1.0)
pH: 5.5 (ref 5.0–8.0)

## 2012-11-20 LAB — BASIC METABOLIC PANEL
BUN: 9 mg/dL (ref 6–23)
CO2: 21 mEq/L (ref 19–32)
Calcium: 8.4 mg/dL (ref 8.4–10.5)
Chloride: 102 mEq/L (ref 96–112)
Creatinine, Ser: 0.63 mg/dL (ref 0.50–1.10)
GFR calc Af Amer: >90 mL/min (ref >90)
GFR calc non Af Amer: >90 mL/min (ref >90)
Glucose, Bld: 274 mg/dL — ABNORMAL HIGH (ref 70–99)
Potassium: 3.9 mEq/L (ref 3.5–5.1)
Sodium: 134 mEq/L — ABNORMAL LOW (ref 135–145)

## 2012-11-20 LAB — CBC WITH DIFFERENTIAL/PLATELET
Basophils Absolute: 0 10*3/uL (ref 0.0–0.1)
Basophils Relative: 0 % (ref 0–1)
Eosinophils Absolute: 0 10*3/uL (ref 0.0–0.7)
Eosinophils Relative: 0 % (ref 0–5)
HCT: 38 % (ref 36.0–46.0)
Hemoglobin: 12.9 g/dL (ref 12.0–15.0)
Lymphocytes Relative: 50 % — ABNORMAL HIGH (ref 12–46)
Lymphs Abs: 4.2 10*3/uL — ABNORMAL HIGH (ref 0.7–4.0)
MCH: 30.9 pg (ref 26.0–34.0)
MCHC: 33.9 g/dL (ref 30.0–36.0)
MCV: 90.9 fL (ref 78.0–100.0)
Monocytes Absolute: 0.7 10*3/uL (ref 0.1–1.0)
Monocytes Relative: 8 % (ref 3–12)
Neutro Abs: 3.5 10*3/uL (ref 1.7–7.7)
Neutrophils Relative %: 41 % — ABNORMAL LOW (ref 43–77)
Platelets: 372 10*3/uL (ref 150–400)
RBC: 4.18 MIL/uL (ref 3.87–5.11)
RDW: 13 % (ref 11.5–15.5)
WBC: 8.5 10*3/uL (ref 4.0–10.5)

## 2012-11-20 LAB — CREATININE, SERUM
Creatinine, Ser: 0.55 mg/dL (ref 0.50–1.10)
GFR calc Af Amer: >90 mL/min (ref >90)
GFR calc non Af Amer: >90 mL/min (ref >90)

## 2012-11-20 LAB — GLUCOSE, CAPILLARY
Glucose-Capillary: 240 mg/dL — ABNORMAL HIGH (ref 70–99)
Glucose-Capillary: 311 mg/dL — ABNORMAL HIGH (ref 70–99)
Glucose-Capillary: 353 mg/dL — ABNORMAL HIGH (ref 70–99)

## 2012-11-20 LAB — CBC
HCT: 34 % — ABNORMAL LOW (ref 36.0–46.0)
Hemoglobin: 11.4 g/dL — ABNORMAL LOW (ref 12.0–15.0)
MCH: 30.3 pg (ref 26.0–34.0)
MCHC: 33.5 g/dL (ref 30.0–36.0)
MCV: 90.4 fL (ref 78.0–100.0)
Platelets: 368 10*3/uL (ref 150–400)
RBC: 3.76 MIL/uL — ABNORMAL LOW (ref 3.87–5.11)
RDW: 13.1 % (ref 11.5–15.5)
WBC: 13.2 10*3/uL — ABNORMAL HIGH (ref 4.0–10.5)

## 2012-11-20 LAB — URINE MICROSCOPIC-ADD ON

## 2012-11-20 LAB — TROPONIN I: Troponin I: <0.3 ng/mL (ref <0.30)

## 2012-11-20 LAB — D-DIMER, QUANTITATIVE (NOT AT ARMC): D-Dimer, Quant: <0.27 ug/mL-FEU (ref 0.00–0.48)

## 2012-11-20 LAB — PREGNANCY, URINE: Preg Test, Ur: NEGATIVE

## 2012-11-20 MED ORDER — SODIUM CHLORIDE 0.9 % IV BOLUS (SEPSIS)
1000.0000 mL | Freq: Once | INTRAVENOUS | Status: AC
  Administered 2012-11-20: 1000 mL via INTRAVENOUS

## 2012-11-20 MED ORDER — DIPHENHYDRAMINE HCL 50 MG/ML IJ SOLN
50.0000 mg | Freq: Once | INTRAMUSCULAR | Status: AC
  Administered 2012-11-20: 50 mg via INTRAVENOUS
  Filled 2012-11-20: qty 1

## 2012-11-20 MED ORDER — SODIUM CHLORIDE 0.9 % IV SOLN
INTRAVENOUS | Status: AC
  Administered 2012-11-20: 17:00:00 via INTRAVENOUS

## 2012-11-20 MED ORDER — HEPARIN SODIUM (PORCINE) 5000 UNIT/ML IJ SOLN
5000.0000 [IU] | Freq: Three times a day (TID) | INTRAMUSCULAR | Status: DC
  Administered 2012-11-20 – 2012-11-26 (×17): 5000 [IU] via SUBCUTANEOUS
  Filled 2012-11-20 (×24): qty 1

## 2012-11-20 MED ORDER — INSULIN ASPART 100 UNIT/ML ~~LOC~~ SOLN
0.0000 [IU] | Freq: Three times a day (TID) | SUBCUTANEOUS | Status: DC
  Administered 2012-11-20: 7 [IU] via SUBCUTANEOUS
  Administered 2012-11-21: 5 [IU] via SUBCUTANEOUS
  Administered 2012-11-21 (×2): 2 [IU] via SUBCUTANEOUS
  Administered 2012-11-22: 9 [IU] via SUBCUTANEOUS
  Administered 2012-11-22 – 2012-11-23 (×3): 3 [IU] via SUBCUTANEOUS
  Administered 2012-11-23 – 2012-11-24 (×2): 5 [IU] via SUBCUTANEOUS

## 2012-11-20 MED ORDER — INSULIN ASPART 100 UNIT/ML ~~LOC~~ SOLN
0.0000 [IU] | Freq: Every day | SUBCUTANEOUS | Status: DC
  Administered 2012-11-20: 5 [IU] via SUBCUTANEOUS
  Administered 2012-11-21: 3 [IU] via SUBCUTANEOUS
  Administered 2012-11-22 – 2012-11-23 (×2): 4 [IU] via SUBCUTANEOUS

## 2012-11-20 MED ORDER — INSULIN ASPART 100 UNIT/ML ~~LOC~~ SOLN
14.0000 [IU] | Freq: Three times a day (TID) | SUBCUTANEOUS | Status: DC
  Administered 2012-11-20 – 2012-11-22 (×5): 14 [IU] via SUBCUTANEOUS

## 2012-11-20 MED ORDER — INSULIN GLARGINE 100 UNIT/ML ~~LOC~~ SOLN
30.0000 [IU] | Freq: Every day | SUBCUTANEOUS | Status: DC
  Administered 2012-11-20 – 2012-11-25 (×6): 30 [IU] via SUBCUTANEOUS
  Filled 2012-11-20 (×8): qty 0.3

## 2012-11-20 MED ORDER — ACETAMINOPHEN 650 MG RE SUPP: 650.0000 mg | Freq: Four times a day (QID) | RECTAL | Status: DC | PRN

## 2012-11-20 MED ORDER — ACETAMINOPHEN 325 MG PO TABS
650.0000 mg | ORAL_TABLET | Freq: Four times a day (QID) | ORAL | Status: DC | PRN
  Administered 2012-11-20 – 2012-11-26 (×7): 650 mg via ORAL
  Filled 2012-11-20 (×5): qty 2

## 2012-11-20 MED ORDER — METHYLPREDNISOLONE SODIUM SUCC 125 MG IJ SOLR
80.0000 mg | Freq: Once | INTRAMUSCULAR | Status: AC
  Administered 2012-11-20: 80 mg via INTRAVENOUS
  Filled 2012-11-20: qty 2

## 2012-11-20 MED ORDER — SODIUM CHLORIDE 0.9 % IV BOLUS (SEPSIS): 1000.0000 mL | Freq: Once | INTRAVENOUS | Status: DC

## 2012-11-20 MED ORDER — SODIUM CHLORIDE 0.9 % IV SOLN
INTRAVENOUS | Status: AC
  Administered 2012-11-21: 06:00:00 via INTRAVENOUS

## 2012-11-20 NOTE — ED Notes (Addendum)
2nd liter of ivf ns kvo at this time.

## 2012-11-20 NOTE — ED Notes (Signed)
Pt here for itching, reported facial swelling and lightheadness at onset of work this morning when she was walking the halls at her job.

## 2012-11-20 NOTE — ED Notes (Signed)
Returned from xray

## 2012-11-20 NOTE — ED Provider Notes (Signed)
CSN: [id number]     Arrival date & time 11/20/12  1478 History   First MD Initiated Contact with Patient 11/20/12 0745     Chief Complaint  Patient presents with  . Dizziness  . Pruritis   (Consider location/radiation/quality/duration/timing/severity/associated sxs/prior Treatment) HPI   Pt is 37 yo AAF with a PMH of DM1 and depression/anxiety who presents to the ED with a syncopal episode associated with hypotension and pruritis.  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  She denies any CP, abdominal pain, N/V, but endorses SOB.  She does not report any specific allergies or exposures.  She denies taking any medications or recreational drugs prior to the event.  She denies having her menstrual period at this time.  She states her daughter has allergies to something but it is unclear as to what.  She reports never having anything like this before.  Her initial CBG was in the 200's. She was hypotensive 71/40 and tachycardic at 103 and was given fluids and her BP increased to 97/66.  She was cold and shivering.  She is a smoker.    Past Medical History  Diagnosis Date  . Diabetes mellitus     Type I  . Depression   . Anxiety   . HSV-2 (herpes simplex virus 2) infection 2012    serology   Past Surgical History  Procedure Laterality Date  . Tubal ligation     Family History  Problem Relation Age of Onset  . Diabetes Mother   . Hypertension Mother   . Diabetes Maternal Grandmother    History  Substance Use Topics  . Smoking status: Current Some Day Smoker -- 1.00 packs/day    Types: Cigarettes  . Smokeless tobacco: Never Used     Comment: trying to quit  . Alcohol Use: No   OB History   Grav Para Term Preterm Abortions TAB SAB Ect Mult Living                 Review of Systems  Constitutional: Positive for chills. Negative for fever.  HENT: Negative for congestion.   Eyes: Negative  for visual disturbance.  Respiratory: Positive for shortness of breath.   Cardiovascular: Negative for chest pain.  Gastrointestinal: Negative for nausea, vomiting, abdominal pain, diarrhea and blood in stool.  Genitourinary: Negative for dysuria, flank pain and vaginal bleeding.  Musculoskeletal: Negative for back pain, neck pain and neck stiffness.  Skin: Negative for rash.  Allergic/Immunologic: Negative for environmental allergies and food allergies.  Neurological: Positive for dizziness, syncope and light-headedness. Negative for weakness and headaches.    Allergies  Review of patient's allergies indicates no known allergies.  Home Medications   Current Outpatient Rx  Name  Route  Sig  Dispense  Refill  . insulin aspart (NOVOLOG) 100 UNIT/ML injection   Subcutaneous   Inject 14 Units into the skin 3 (three) times daily before meals.          . insulin glargine (LANTUS) 100 UNIT/ML injection   Subcutaneous   Inject 30 Units into the skin at bedtime.          BP 107/71  Pulse 95  Temp(Src) 99 F (37.2 C) (Oral)  Resp 16  Wt 172 lb (78.019 kg)  SpO2 98% Physical Exam  Nursing note and vitals reviewed. Constitutional: She is oriented to person, place, and time. She appears well-developed  and well-nourished. She appears distressed.  HENT:  Head: Normocephalic and atraumatic.  Eyes: Conjunctivae and EOM are normal. Pupils are equal, round, and reactive to light.  Neck: Neck supple.  Cardiovascular: Regular rhythm and normal heart sounds.  Exam reveals no gallop and no friction rub.   No murmur heard. Tachycardic.   Pulmonary/Chest: Effort normal and breath sounds normal.  Abdominal: Soft. There is no tenderness. There is no rebound and no guarding.  Musculoskeletal: She exhibits no edema.  Neurological: She is alert and oriented to person, place, and time. No cranial nerve deficit. GCS eye subscore is 4. GCS verbal subscore is 5. GCS motor subscore is 6.  5+  strength in UE and LE with f/e at major joints. Sensation to palpation intact in UE and LE. CNs 2-12 grossly intact.  EOMFI.  PERRL.   Finger nose and coordination intact bilateral.   Visual fields intact to finger testing. Stuttering intermittent  Skin: Skin is warm and dry. No rash noted. She is not diaphoretic. No erythema. No pallor.    ED Course  Procedures (including critical care time) Labs Review Labs Reviewed  CBC WITH DIFFERENTIAL - Abnormal; Notable for the following:    Neutrophils Relative % 41 (*)    Lymphocytes Relative 50 (*)    Lymphs Abs 4.2 (*)    All other components within normal limits  BASIC METABOLIC PANEL - Abnormal; Notable for the following:    Sodium 134 (*)    Glucose, Bld 274 (*)    All other components within normal limits  GLUCOSE, CAPILLARY - Abnormal; Notable for the following:    Glucose-Capillary 240 (*)    All other components within normal limits  URINALYSIS, ROUTINE W REFLEX MICROSCOPIC - Abnormal; Notable for the following:    APPearance CLOUDY (*)    Glucose, UA >1000 (*)    All other components within normal limits  URINE MICROSCOPIC-ADD ON - Abnormal; Notable for the following:    Squamous Epithelial / LPF FEW (*)    Bacteria, UA MANY (*)    All other components within normal limits  PREGNANCY, URINE  D-DIMER, QUANTITATIVE  TROPONIN I   Imaging Review Ct Head Wo Contrast  11/20/2012   CLINICAL DATA:  Initial encounter for acute onset of dizziness and stuttering of while speaking earlier today. Current history of diabetes, major depression, and migraine headaches.  EXAM: CT HEAD WITHOUT CONTRAST  TECHNIQUE: Contiguous axial images were obtained from the base of the skull through the vertex without intravenous contrast.  COMPARISON:  08/09/2007, 07/08/2004.  FINDINGS: Ventricular system normal in size and appearance for age. No mass lesion. No midline shift. No acute hemorrhage or hematoma. No extra-axial fluid collections. No evidence  of acute infarction. No focal brain parenchymal abnormalities. No significant interval change intracranially.  No focal osseous abnormality involving the skull. Mild mucosal thickening in scattered ethmoid air cells bilaterally. Remaining visualized paranasal sinuses, bilateral mastoid air cells, and bilateral middle ear cavities well-aerated.  IMPRESSION: 1. Normal and stable intracranially. 2. 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   Electronically Signed   By: Hulan Saas M.D.   On: 11/20/2012 11:07    EKG Interpretation     Ventricular Rate:  105 PR Interval:  145 QRS Duration: 74 QT Interval:  336 QTC Calculation: 444 R Axis:   66 Text Interpretation:  Sinus tachycardia No acute findings            MDM   1. Dehydration  2. Hypotension   3. Stuttering     Hypotension: Unclear etiology, pt was given benadryl (for itching), 1L NS bolus, 80mg  solumedrol which resolved her HTN.  Pt has new onset stuttering and is unable to ambulate without assistance.  Psych consult?  Will admit to [facility] [place] for observation.   -EKG: sinus tachycardia -d-dimer: normal -pregnancy test: negative -CBC/diff: unremarkable -BMP: unremarkable     Boykin Peek, MD 11/20/12 1344  Medical screening examination/treatment/procedure(s) were conducted as a shared visit with non-physician practitioner(s) or resident  and myself.  I personally evaluated the patient during the encounter and agree with the findings and plan unless otherwise indicated.    I have personally reviewed any xrays and/ or EKG's with the provider and I agree with interpretation.   Presented with hypotension 79 systolic and pruritis that started while at work.  No cp.  Vague/ mild sob that has resolved. No new exposures.  No cardiac hx.  No bleeding.  Pt had coffee this am.  No trismus, uvular deviation, unilateral posterior pharyngeal edema or submandibular swelling.No rashes. No FH angioedema. Exam dry mm,  sinus tachycardia, CNs intact, moves ext equal, abd soft/ NT, no flank pain.  Patient denies blood clot history, active cancer, recent major trauma or surgery, unilateral leg swelling/ pain, recent long travel, hemoptysis or oral contraceptives. Low risk PE, ddimer.  Fluid bolus.  Labs.  Steroids/ benadryl incase atypical allergic rx. EKG sinus tach, reviewed.   Pt rechecked multiple times, vitals improved with fluid boluses however pt developed stuttering, she has had recent stressors.  Discussed via phone with neuro on call, no acute concerns with stuttering, fup outpt pcp/ neuro.  Plan for discharge and pt unable to walk, feels week.  Her repeat neuro exam normal except gait.  Vitals normalized in ED.   Admitted for gait difficulty, Hypotension, Stuttering, dehydration   Enid Skeens, MD 11/20/12 1740

## 2012-11-20 NOTE — ED Notes (Signed)
MD at bedside. 

## 2012-11-20 NOTE — ED Notes (Signed)
Patient could not ambulate in room; took a few steps and could not bear her own weight any more. Required both nurse and nurse tech to get her back into bed without falling.

## 2012-11-20 NOTE — H&P (Signed)
Family Medicine Teaching [facility] Admission History and Physical Service Pager: [phone]  Patient name: Barbara Clark Medical record number: [id number] Date of birth: 01-28-1975 Age: 37 y.o. Gender: female  Primary Care Provider: Janit Pagan, MD Consultants: none Code Status: Full  Chief Complaint: generalized itching, acute stuttering and inability to walk  Assessment and Plan: Barbara Clark is a 37 y.o. female presenting with generalized pruritus, stuttering and inability to walk . PMH is significant for type 1 diabetes  # Stuttering and acute inability to walk: appears to be from acute stress reaction. There is no evidence of focal weakness on exam when patient is laying down.   Patient was treated with benadryl and solumedrol in the ED. CT head normal.  - continue benadryl as needed - observe overnight  # Pruritus: potentially from allergic reaction. Unclear etiology although patient thinking it could from hazelnut in coffee. Pruritis has resolved. No evidence of lip swelling or airway obstruction on exam - benadryl as needed  # Hypotension: in the 80's systolic on admission. Responded to 2L NS - continue NS at 75cc/hr  # Type1 Diabetes: CBG's on admission in the 270's with >1000 glucose in urine. BMP not showing any evidence of DKA. Latest A1C: 8.7 on 02/11/12 - restart home lantus 30units and novolog 14u at meal time. Add Insulin sliding scale as well.  - obtain A1C  FEN/GI: carb modified diet Prophylaxis: heparin  Disposition: admit for observation. Pending improvement of stuttering and ability to walk  History of Present Illness: Barbara Clark is a 37 y.o. female presenting with generalized pruritus that started this morning when she was walking at work. She also felt like she had some swelling of her lips at the time. She therefore came to the ED to be evaluated. In the ED, she received benadryl and solumedrol which helped with the itching. She then developed  acute stuttering and difficulty speaking. The ED physician curb sided neurology who agreed that this was likely from acute stress reaction and for her to be seen by neurology as an outpatient. Upon discharge, patient was unable to walk, in 3 different instances. At which point, FPTS was called for admission for observation.   Patient denies any new medication or over the counter medications. She had coffee with hazelnut which she thinks could potentially have been related. She denies any cough, any shortness of breath. She reports 1 episode of diarrhea on Wednesday which self resolved. She denies any fever, no sick contacts. She has had episode of stuttering in the past, associated with possible allergic reactions.   Review Of Systems: Per HPI with the following additions: none Otherwise 12 point review of systems was performed and was unremarkable.  Patient Active Problem List   Diagnosis Date Noted  . Vaginal discharge 04/12/2012  . High risk sexual behavior 04/12/2012  . Foot pain, bilateral 03/14/2012  . TOBACCO USER 10/27/2008  . DIABETES MELLITUS, I 03/18/2006  . DEPRESSION, MAJOR, RECURRENT 03/18/2006  . MIGRAINE, UNSPEC., W/O INTRACTABLE MIGRAINE 03/18/2006   Past Medical History: Past Medical History  Diagnosis Date  . Diabetes mellitus     Type I  . Depression   . Anxiety   . HSV-2 (herpes simplex virus 2) infection 2012    serology   Past Surgical History: Past Surgical History  Procedure Laterality Date  . Tubal ligation     Social History: History  Substance Use Topics  . Smoking status: Current Some Day Smoker -- 1.00 packs/day    Types: Cigarettes  .  Smokeless tobacco: Never Used     Comment: trying to quit  . Alcohol Use: Yes     Comment: rare but on occasion   Additional social history: none Please also refer to relevant sections of EMR.  Family History: Family History  Problem Relation Age of Onset  . Diabetes Mother   . Hypertension Mother   .  Diabetes Maternal Grandmother    Allergies and Medications: No Known Allergies No current facility-administered medications on file prior to encounter.   Current Outpatient Prescriptions on File Prior to Encounter  Medication Sig Dispense Refill  . insulin aspart (NOVOLOG) 100 UNIT/ML injection Inject 14 Units into the skin 3 (three) times daily before meals.       . insulin glargine (LANTUS) 100 UNIT/ML injection Inject 30 Units into the skin at bedtime.        Objective: BP 114/70  Pulse 101  Temp(Src) 98 F (36.7 C) (Oral)  Resp 18  Ht 5\' 7"  (1.702 m)  Wt 172 lb (78.019 kg)  BMI 26.93 kg/m2  SpO2 99%  LMP 10/28/2012 Exam: General: anxious appearing, stuttering and difficult to understand HEENT: PERRLA, oropharynx clear without soft tissue obstruction, tongue and lips normal Cardiovascular: S1S2, mildly tachycardic in the 100's, regular rhythm, no murmur Respiratory: CTA bilaterally, no wheezing Abdomen: no tenderness, soft, non distended Extremities: no edema Skin: no urticaria or rash Neuro: CN2-12 grossly intact, normal grip strength bilaterally, 5/5 strength with hip flexion, knee extension, flexion, plantarflexion and dorsiflexion bilaterally  Labs and Imaging: CBC BMET   Recent Labs Lab 11/20/12 1700  WBC 13.2*  HGB 11.4*  HCT 34.0*  PLT 368    Recent Labs Lab 11/20/12 0830 11/20/12 1700  NA 134*  --   K 3.9  --   CL 102  --   CO2 21  --   BUN 9  --   CREATININE 0.63 0.55  GLUCOSE 274*  --   CALCIUM 8.4  --      CT head: 11/20/12 FINDINGS:  Ventricular system normal in size and appearance for age. No mass  lesion. No midline shift. No acute hemorrhage or hematoma. No  extra-axial fluid collections. No evidence of acute infarction. No  focal brain parenchymal abnormalities. No significant interval  change intracranially.  No focal osseous abnormality involving the skull. Mild mucosal  thickening in scattered ethmoid air cells bilaterally.  Remaining  visualized paranasal sinuses, bilateral mastoid air cells, and  bilateral middle ear cavities well-aerated.  IMPRESSION:  1. Normal and stable intracranially.  2. 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  Lonia Skinner, MD 11/20/2012, 6:25 PM PGY-3, [facility] FPTS Intern pager: [phone], text pages welcome

## 2012-11-20 NOTE — ED Notes (Signed)
Patient transported to CT 

## 2012-11-21 ENCOUNTER — Inpatient Hospital Stay (HOSPITAL_COMMUNITY): Payer: Medicaid Other

## 2012-11-21 DIAGNOSIS — N898 Other specified noninflammatory disorders of vagina: Secondary | ICD-10-CM

## 2012-11-21 LAB — CBC
HCT: 30.6 % — ABNORMAL LOW (ref 36.0–46.0)
Hemoglobin: 10.4 g/dL — ABNORMAL LOW (ref 12.0–15.0)
MCH: 30.6 pg (ref 26.0–34.0)
MCHC: 34 g/dL (ref 30.0–36.0)
MCV: 90 fL (ref 78.0–100.0)
Platelets: 349 10*3/uL (ref 150–400)
RBC: 3.4 MIL/uL — ABNORMAL LOW (ref 3.87–5.11)
RDW: 13.1 % (ref 11.5–15.5)
WBC: 18.4 10*3/uL — ABNORMAL HIGH (ref 4.0–10.5)

## 2012-11-21 LAB — BASIC METABOLIC PANEL
BUN: 11 mg/dL (ref 6–23)
CO2: 23 mEq/L (ref 19–32)
Calcium: 8.4 mg/dL (ref 8.4–10.5)
Chloride: 104 mEq/L (ref 96–112)
Creatinine, Ser: 0.59 mg/dL (ref 0.50–1.10)
GFR calc Af Amer: >90 mL/min (ref >90)
GFR calc non Af Amer: >90 mL/min (ref >90)
Glucose, Bld: 347 mg/dL — ABNORMAL HIGH (ref 70–99)
Potassium: 3.9 mEq/L (ref 3.5–5.1)
Sodium: 134 mEq/L — ABNORMAL LOW (ref 135–145)

## 2012-11-21 LAB — GLUCOSE, CAPILLARY
Glucose-Capillary: 315 mg/dL — ABNORMAL HIGH (ref 70–99)
Glucose-Capillary: 283 mg/dL — ABNORMAL HIGH (ref 70–99)
Glucose-Capillary: 198 mg/dL — ABNORMAL HIGH (ref 70–99)
Glucose-Capillary: 163 mg/dL — ABNORMAL HIGH (ref 70–99)
Glucose-Capillary: 239 mg/dL — ABNORMAL HIGH (ref 70–99)

## 2012-11-21 LAB — HEMOGLOBIN A1C
Hgb A1c MFr Bld: 8.9 % — ABNORMAL HIGH (ref <5.7)
Mean Plasma Glucose: 209 mg/dL — ABNORMAL HIGH (ref <117)

## 2012-11-21 MED ORDER — FLUCONAZOLE 150 MG PO TABS
150.0000 mg | ORAL_TABLET | Freq: Once | ORAL | Status: AC
  Administered 2012-11-21: 150 mg via ORAL
  Filled 2012-11-21: qty 1

## 2012-11-21 NOTE — Progress Notes (Signed)
FMTS Attending Note Patient's care discussed with resident team, I agree with Dr Dennison [name] assessment and plan as per this note.  Paula Compton, MD

## 2012-11-21 NOTE — Progress Notes (Addendum)
PCP Note: Barbara Clark I stopped by the hospital to meet [name], as she had not been to the clinic to see me before,unfortunately she is a little sleepy this morning, her boy friend was by her bedside who stated she is better except for her stuttering which is new. Patient tried to communicate with me but was stuttering a lot. I am not sure what her baseline was as this is my first time meeting her. Patient has hx of DM but was not found to be in DKA on admission which could have caused neurologic insult leading to stuttering. 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 If Stuttering persist might benefit from MRI brain to r/o occult stroke. I appreciate care provided by FMTS team. I will see her again for follow up at the clinic.

## 2012-11-21 NOTE — Evaluation (Signed)
Clinical/Bedside Swallow Evaluation Patient Details  Name: Zyanna Leisinger MRN: [id number] Date of Birth: [birth year]  Today's Date: 11/21/2012 Time: 4098-1191 SLP Time Calculation (min): 27 min  Past Medical History:  Past Medical History  Diagnosis Date  . Diabetes mellitus     Type I  . Depression   . Anxiety   . HSV-2 (herpes simplex virus 2) infection 2012    serology   Past Surgical History:  Past Surgical History  Procedure Laterality Date  . Tubal ligation     HPI:  37 yo female adm to [facility] with acute stuttering, decreased walking ability and generalized itching.  CT head negative.  Order for swallow evaluation received.    Assessment / Plan / Recommendation Clinical Impression  Pt presents with functional oropharyngeal swallow based on clinical evaluation, minimal intake accepted as pt just consumed breakfast and complained of nausea.  Timely swallow and clear vocal quality throughout intake of saltine cracker and ginger-ale.    SLP left communication board for pt after she demonstrated adequate usage.  Note ? conversion disorder per chart review and negative Head CT.  Pt reports family/friends state she has occasional fluent speech - of which she is not aware when it occurs.  CN exam unremarkable except generalized weakness and inconsisent sensory deficits (forehead difficulty locating stimulus -right/left) and sensation of tingling around the mouth.  Family reports pt getting right/left mixed up currently.     As pt with intact swallow *order for swallow eval*, no further SLP indicated.  If desire SLP for speech/stuttering, please order.  SLP tx may also be completed as outpt if desire after source of issue located.  Pt expressed gratitude for communication board.      Aspiration Risk  None    Diet Recommendation Regular;Thin liquid   Liquid Administration via: Straw;Cup Medication Administration: Whole meds with liquid Supervision: Patient able to self  feed Compensations: Slow rate;Small sips/bites Postural Changes and/or Swallow Maneuvers: Seated upright 90 degrees    Other  Recommendations Oral Care Recommendations: Oral care BID   Follow Up Recommendations  None (none for swallow function)    Frequency and Duration   n/a     Pertinent Vitals/Pain Afebrile, decreased    SLP Swallow Goals  n/a   Swallow Study Prior Functional Status   no h/o dysphagia or stuttering per pt and family    General Date of Onset: 11/21/12 HPI: 37 yo female adm to [facility] with acute stuttering, decreased walking ability and generalized itching.  CT head negative.  Order for swallow evaluation received.  Type of Study: Bedside swallow evaluation Diet Prior to this Study: Regular;Thin liquids Temperature Spikes Noted: No Respiratory Status: Room air History of Recent Intubation: No Behavior/Cognition: Alert;Cooperative;Pleasant mood Oral Cavity - Dentition: Adequate natural dentition Self-Feeding Abilities: Able to feed self Patient Positioning: Upright in bed Baseline Vocal Quality: Clear Volitional Cough: Weak Volitional Swallow: Able to elicit    Oral/Motor/Sensory Function Overall Oral Motor/Sensory Function: Other (comment) (sensory deficits appeared inconsistent, pt c/o tingling around mouth) Labial ROM: Within Functional Limits Labial Strength: Reduced (generally reduced, no focal deficit) Labial Sensation:  (complains of tingling around lips) Lingual ROM:  (generally reduced, no focal deficits) Lingual Strength: Reduced Facial ROM:  (decreased ability to move eyebrows- bilaterally) Facial Symmetry: Within Functional Limits Facial Strength:  (decreased ability to move eyebrows- bilaterally) Facial Sensation:  (decr ability to localize forehead sensation, inconsistent) Velum: Within Functional Limits Mandible: Within Functional Limits   Ice Chips Ice chips: Not tested  Thin Liquid Thin Liquid: Within functional limits Presentation:  Self Fed;Straw    Nectar Thick Nectar Thick Liquid: Not tested   Honey Thick Honey Thick Liquid: Not tested   Puree Puree: Not tested   Solid   GO Functional Assessment Tool Used: clinical judgement Functional Limitations: Swallowing Swallow Current Status (Z3086): 0 percent impaired, limited or restricted Swallow Goal Status (V7846): 0 percent impaired, limited or restricted Swallow Discharge Status [phone]): 0 percent impaired, limited or restricted  Solid: Within functional limits Presentation: Self Fed Other Comments: slow but effective mastication       Mills Koller, MS [facility] SLP [phone]

## 2012-11-21 NOTE — Progress Notes (Signed)
Interim Progress Note Family Medicine Teaching Service  I was called by RN after patient reported unwitnessed fall. Pt reports she tripped while attempting to get to bedside commode from chair before assistance arrived. She landed on her buttocks and was able to get up immediately. She denies pain in the area and incontinence. She has a history of lower back pain related to a work-related incident where a client fell on her causing her to fall down. Her vitals are stable, and she continues to have a dramatic stutter limiting vocal communication. There is some midline tenderness overlying the L-S spine and sacrum. Sensation is normal in bilateral lower extremities.   I will get radiographs to evaluate. Urged patient not to get up without assistance at this time. Bedside commode located directly next to bed.   Barbara Deloney B. Jarvis Newcomer, MD, PGY-1 11/21/2012 4:31 PM

## 2012-11-21 NOTE — Progress Notes (Signed)
Inpatient Diabetes Program Recommendations  AACE/ADA: New Consensus Statement on Inpatient Glycemic Control (2013)  Target Ranges:  Prepandial:   less than 140 mg/dL      Peak postprandial:   less than 180 mg/dL (1-2 hours)      Critically ill patients:  140 - 180 mg/dL   Reason for Visit:  Results for MARYLAND, STELL (MRN [id number]) as of 11/21/2012 12:45  Ref. Range 11/20/2012 17:18 11/20/2012 21:52 11/21/2012 03:04 11/21/2012 07:35 11/21/2012 11:32  Glucose-Capillary Latest Range: 70-99 mg/dL 409 (H) 811 (H) 914 (H) 283 (H) 198 (H)   Note that patient is on home diabetes medication regimen.  Please check A1C to determine pre-hospitalization glycemic control.  May need adjustment in home regimen.  Will follow.  Beryl Meager, RN, BC-ADM Inpatient Diabetes Coordinator Pager [phone]

## 2012-11-21 NOTE — H&P (Signed)
FMTS Attending Admission Note: Barbara Levy MD [phone] pager office [phone] I  have seen and examined this patient, reviewed their chart. I have discussed this patient with the resident. I agree with the resident's findings, assessment and care plan. I think she had some type mild allergic reaction (maybe some hives or rash that has now disappeared), she also had some hot flashes but no chills, afebrile here. I think the stuttering is an acute stress reaction. Will observe overnight, treat with some benadryl.Probably D/C tomorrow.

## 2012-11-21 NOTE — Progress Notes (Signed)
Patient claimed that she fell from the recliner chair and landed on her butt, denies pain, no injuries noted at this time, MD made aware.

## 2012-11-21 NOTE — Progress Notes (Signed)
Family Medicine Teaching Service Daily Progress Note Intern Pager: [phone]  Patient name: Barbara Clark Medical record number: [id number] Date of birth: 04/24/1975 Age: 37 y.o. Gender: female  Primary Care Provider: Janit Pagan, MD Consultants: None Code Status: Full code  Pt Overview and Major Events to Date:   Assessment and Plan: [name] is a 37 y.o. female presenting with generalized pruritus, stuttering and inability to walk currently with new onset left-sided hemi-neglect.  # Stuttering and acute inability to walk: appears to be from acute stress reaction. There is no evidence of focal weakness on exam when patient is laying down.  Patient was treated with benadryl and solumedrol in the ED. CT head normal.  - MRI brain to assess for possible infarct - follow-up SLP recommendations regarding speech evaluation - follow-up PT/OT recommendations - consider consulting neurology pending results of MRI  # Left sided hemi-neglect: new finding overnight. - MRI as above  # Pruritus: potentially from allergic reaction. Unclear etiology although patient thinking it could from hazelnut in coffee - benadryl as needed   # Leukocytosis: Most likely caused by steroid demargination of WBCs  # Vaginal pruritis: previous history of yeast infections - fluconazole 150mg  x1  # Hypotension: in the 80's systolic on admission. Responded to 2L NS. Current systolic blood pressures in 90s-120 range.  # Type1 Diabetes: CBG's on admission in the 270's with >1000 glucose in urine. BMP not showing any evidence of DKA. Latest A1C: 8.7 on 02/11/12. Fasting glucose 283 on 11/21/2012. She is s/p steroids which is most likely causing increased blood glucose. - continue home lantus 30 units and novolog 14u at meal time - continue insulin sliding scale  - follow-up A1C   FEN/GI: carb modified diet  Prophylaxis: heparin   #Disposition: pending results of MRI   Subjective: Barbara Clark voiced concern  regarding vaginal itching contributed to a yeast infection which she has had in the past due to her diabetes. She is also concerned regarding the reason for her new onset stuttering. Her boyfriend, Mr. [name], was called and had concerns regarding her Barbara Clark's neglect of her left side. When asked to raise either left or right extremities, she would always raise her right extremity.  Objective: Temp:  [97.6 F (36.4 C)-99 F (37.2 C)] 97.9 F (36.6 C) (11/03 0612) Pulse Rate:  [87-116] 87 (11/03 0612) Resp:  [13-21] 19 (11/03 0612) BP: (71-125)/(40-79) 97/64 mmHg (11/03 0612) SpO2:  [97 %-100 %] 98 % (11/03 0612) Weight:  [172 lb (78.019 kg)] 172 lb (78.019 kg) (11/02 1530)  Physical Exam: General: Laying comfortably in bed in no acute distress. Cardiovascular: RRR, no murmurs Respiratory: clear to auscultation bilaterally Abdomen: soft, non-tender, non-distended Neurology: raised right arm when asked to raise left/right arm. Raised right leg when asked to raise left/right leg. Identified left arm/leg as right arm/leg.   Laboratory:  Recent Labs Lab 11/20/12 0830 11/20/12 1700 11/21/12 0517  WBC 8.5 13.2* 18.4*  HGB 12.9 11.4* 10.4*  HCT 38.0 34.0* 30.6*  PLT 372 368 349    Recent Labs Lab 11/20/12 0830 11/20/12 1700 11/21/12 0517  NA 134*  --  134*  K 3.9  --  3.9  CL 102  --  104  CO2 21  --  23  BUN 9  --  11  CREATININE 0.63 0.55 0.59  CALCIUM 8.4  --  8.4  GLUCOSE 274*  --  347*    Imaging/Diagnostic Tests: None  Jacquelin Hawking, MD 11/21/2012, 7:36 AM PGY-1,  [place] Intern pager: [phone], text pages welcome

## 2012-11-22 LAB — GLUCOSE, CAPILLARY
Glucose-Capillary: 241 mg/dL — ABNORMAL HIGH (ref 70–99)
Glucose-Capillary: 102 mg/dL — ABNORMAL HIGH (ref 70–99)
Glucose-Capillary: 56 mg/dL — ABNORMAL LOW (ref 70–99)
Glucose-Capillary: 190 mg/dL — ABNORMAL HIGH (ref 70–99)
Glucose-Capillary: 353 mg/dL — ABNORMAL HIGH (ref 70–99)

## 2012-11-22 NOTE — Evaluation (Signed)
Speech Language Pathology Evaluation Patient Details Name: Barbara Clark MRN: [id number] DOB: [birth year] Today's Date: 11/22/2012 Time: 7829-5621 SLP Time Calculation (min): 39 min  Problem List:  Patient Active Problem List   Diagnosis Date Noted  . Vaginal discharge 04/12/2012  . High risk sexual behavior 04/12/2012  . Foot pain, bilateral 03/14/2012  . TOBACCO USER 10/27/2008  . DIABETES MELLITUS, I 03/18/2006  . DEPRESSION, MAJOR, RECURRENT 03/18/2006  . MIGRAINE, UNSPEC., W/O INTRACTABLE MIGRAINE 03/18/2006   Past Medical History:  Past Medical History  Diagnosis Date  . Diabetes mellitus     Type I  . Depression   . Anxiety   . HSV-2 (herpes simplex virus 2) infection 2012    serology   Past Surgical History:  Past Surgical History  Procedure Laterality Date  . Tubal ligation     HPI:  37 yo female adm to [facility] with acute stuttering, decreased walking ability and generalized itching.  CT head negative.  Order for swallow evaluation received.    Assessment / Plan / Recommendation Clinical Impression  Pt demonstrates inconsistent function indicating probable psychogenic etiology of speech impairment vs malingering. Pts stutter is completely atypical with hypernasal resonance, deleting phonemes and extending others with groping behaviors. Pt reports she cannot hear and needs to read lips, but then is observed to understand entire phrases without making eye contact with speaker. She reports she forgets information such as how to count, how to use her phone or differentiating her right from her left. She uses her right hand without difficulty and though she initially could not turn on her phone with cues she then easily used it to show pictures.   Her mother was present at bedside and spoke to this therapist after the session. SLP discussed strategies to facilitate communication (writing and then reading what was written) and mother and sister reported they felt the pt was  'acting.' SLP attempted to aducate family on possiblity that pt may believe she truly has an impairment due to conversion disorder. They felt that pt had demonstrated behaviors to achieve attention as secondary gain in the past. At this time, SLP will continue to follow pt in acute setting and if communcation does not resolve pt may need outpatient service in addition to psych treatment. 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     SLP Assessment  Patient needs continued Speech Lanaguage Pathology Services    Follow Up Recommendations  Outpatient SLP    Frequency and Duration min 1 x/week  2 weeks   Pertinent Vitals/Pain NA   SLP Goals  SLP Goals Potential to Achieve Goals: Good  SLP Evaluation Prior Functioning  Cognitive/Linguistic Baseline: Within functional limits Type of Home: House Vocation: Full time employment (CNA at [employer])   Cognition  Overall Cognitive Status: Impaired/Different from baseline Arousal/Alertness: Awake/alert Orientation Level: Oriented X4 Attention: Selective Selective Attention: Appears intact Memory: Appears intact (demonstrates short and long term, says she cant remember) Awareness: Appears intact Problem Solving: Appears intact    Comprehension  Auditory Comprehension Overall Auditory Comprehension: Appears within functional limits for tasks assessed Yes/No Questions: Within Functional Limits Commands: Within Functional Limits Conversation: Complex Other Conversation Comments: With confrontation says she doesnt understand, demonstrates understanding of complex conversation Interfering Components: Anxiety Reading Comprehension Reading Status: Within funtional limits    Expression Expression Primary Mode of Expression:  (uses gestures, verbal expression, writing with cues) Verbal Expression Overall Verbal Expression: Impaired Initiation:  Impaired Level of  Generative/Spontaneous Verbalization: Phrase;Word Repetition: Impaired Level of Impairment: Word level Naming: No impairment Effective Techniques: Written cues Written Expression Dominant Hand: Right   Oral / Motor Oral Motor/Sensory Function Overall Oral Motor/Sensory Function: Appears within functional limits for tasks assessed Motor Speech Overall Motor Speech: Impaired Respiration: Within functional limits Phonation: Other (comment) (does not sustain phonation) Resonance: Within functional limits Articulation: Impaired Level of Impairment: Word Intelligibility: Intelligibility reduced Word: 25-49% accurate Phrase: 25-49% accurate Motor Planning: Impaired Level of Impairment: Word Motor Speech Errors: Groping for words;Inconsistent;Aware Effective Techniques: Pacing   GO    Harlon Ditty, [place] CCC-SLP [phone]  Claudine Mouton 11/22/2012, 4:54 PM

## 2012-11-22 NOTE — Evaluation (Signed)
Physical Therapy Evaluation Patient Details Name: Barbara Clark MRN: [id number] DOB: [birth year] Today's Date: 11/22/2012 Time: 2130-8657 PT Time Calculation (min): 25 min  PT Assessment / Plan / Recommendation History of Present Illness  Barbara Clark is a 37 y.o. female presenting with generalized pruritus, stuttering and inability to walk . PMH is significant for type 1 diabetes  Clinical Impression  Therapy evaluation limited with inconsistent findings. At times patient requiring varying degrees of support and assist.  When going from bed to commode patient was independent, but then when attempting to stand patient required increased assist.  During ambulation gait pattern fluctuated again causing need for various degrees of support. During strength assessment, patient demonstrates bilateral LE weakness with sub optimal effort and patient became emotional and began to cry and persist that we assess strength again, to which patient could not follow commands consistently. Later in the session, strength was reassessed and appeared [facility].  Patient had notepad and paper to answer questions, but continued to try to communicate verbally with significant stutter impacting communication, when directed to use note pad patient wrote "hi" and then began trying to communicate verbally in response to questions again. Without prompting, patient did report that she did not know her left from her right. Patient then showed [place] that she had written on her hands L and R to keep her directed.  No evidence of neglect during session at all. Patient attended well bilaterally, picking up her phone from the bed on her left, keying in her password to show [place] the picture of her swollen face.  Later in session, pen was placed in bed to her left, patient was able to look to her left and pick up the pen immediately, then pick up the note pad on her right side.  At this time, will trial patient with PT services for mobility.  Recommend  d/c home with significant other upon discharge.     PT Assessment  Patient needs continued PT services    Follow Up Recommendations  Supervision/Assistance - 24 hour          Equipment Recommendations  None recommended by PT    Recommendations for Other Services Other (comment) (patient may benefit from cognition screen or psych evaluatio)   Frequency Min 3X/week    Precautions / Restrictions Precautions Precautions: Fall Restrictions Weight Bearing Restrictions: No   Pertinent Vitals/Pain No pain reported      Mobility  Bed Mobility Bed Mobility: Supine to Sit;Sitting - Scoot to Edge of Bed;Sit to Supine Details for Bed Mobility Assistance: upon entering the room patient was performing bed mobility independently sitting up and coming to EOB.  Transfers Transfers: Sit to Stand;Stand to [employer] Transfers Sit to Stand: 4: Min guard;4: Min assist;2: Max assist;From bed;From chair/3-in-1 Stand to Sit: 4: Min guard;4: Min assist;3: Mod assist;To bed;To chair/3-in-1 Stand Pivot Transfers: 7: Independent Details for Transfer Assistance: Varying effort and ability with every transfer, at times able to perform independently, other times requiring varying levels of assist, No consistent trend of function throughout session. Ambulation/Gait Ambulation/Gait Assistance: Other (comment) Ambulation Distance (Feet): 12 Feet Assistive device: 2 person hand held assist General Gait Details: Patient attempted ambulation with +2 HHA. Patient very unsafe with inconsistent gait deviations. At times patient with fully flexed posture with head to knees demonstrating significant safety concerns, Other times ambulating with minimal contact from therapist for assist. Patient at times starting tapping her head and spinning her finger as if to state she was dizzy, but  could not confirm when questioned.  During ambulation back to bed, patient lunged forward to the bed and crawled from the foot of  the bed up to the head of the bed and positioned herself in sidelying position without assist.         PT Diagnosis: Abnormality of gait;Altered mental status  PT Problem List: Decreased activity tolerance;Decreased mobility;Decreased cognition;Decreased safety awareness PT Treatment Interventions: Gait training;Functional mobility training;Therapeutic activities;Therapeutic exercise;Balance training;Patient/family education     PT Goals(Current goals can be found in the care plan section) Acute Rehab PT Goals Patient Stated Goal: none stated PT Goal Formulation: Patient unable to participate in goal setting Time For Goal Achievement: 12/06/12 Potential to Achieve Goals: Fair  Visit Information  Last PT Received On: 11/22/12 Assistance Needed: +2 History of Present Illness: Barbara Clark is a 37 y.o. female presenting with generalized pruritus, stuttering and inability to walk . PMH is significant for type 1 diabetes       Prior Functioning  Home Living Family/patient expects to be discharged to:: Private residence Living Arrangements: Spouse/significant other Type of Home: House Home Access: Ramped entrance Home Layout: One level Home Equipment: None Prior Function Level of Independence: Independent Communication Communication: No difficulties Dominant Hand: Right    Cognition  Cognition Arousal/Alertness: Awake/alert Behavior During Therapy: Anxious;Impulsive Overall Cognitive Status: Difficult to assess Area of Impairment: Following commands;Safety/judgement;Awareness;Problem solving Memory: Decreased short-term memory Following Commands: Follows one step commands inconsistently;Follows multi-step commands inconsistently Safety/Judgement: Decreased awareness of safety;Decreased awareness of deficits Awareness: Anticipatory Problem Solving: Slow processing;Decreased initiation;Difficulty sequencing;Requires verbal cues;Requires tactile cues General Comments: All  behaviors very inconsistent throughout session, at times unable to follow commands but was able to perform several commanded activities at other times.    Extremity/Trunk Assessment Upper Extremity Assessment Upper Extremity Assessment: Defer to OT evaluation Lower Extremity Assessment Lower Extremity Assessment: Difficult to assess due to impaired cognition (Patient with sub-optimal effort during testing, began crying)   Balance  Varying  End of Session PT - End of Session Equipment Utilized During Treatment: Gait belt Activity Tolerance: Patient limited by fatigue Patient left: in bed;with call bell/phone within reach;with bed alarm set Nurse Communication: Mobility status  GP     Fabio Asa 11/22/2012, 3:44 PM Charlotte Crumb, PT DPT  [phone]

## 2012-11-22 NOTE — Progress Notes (Signed)
FMTS Attending Note Patient seen and examined by me today, discussed with resident team and I agree with Dr Dennison [name] note.  Patient continues with stuttering, inability to expresses multi-syllable words. Is writing and using UEs fluidly, does not appear to have any receptive deficits.  Barbara Clark comes back to recent death of family dog, which reminds her of the death of her children's father in April of 2013.  (she brought this up when asked questions re. 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  She has had gait d/o as well during this episode of illness. Would favor inpatient psychiatry evaluation regarding to confirm this dx and to recommend setting of initial treatment (given her apparent functional limitations).  Paula Compton, MD

## 2012-11-22 NOTE — Evaluation (Signed)
Occupational Therapy Evaluation Patient Details Name: Barbara Clark MRN: [id number] DOB: 12-24-1975 Today's Date: 11/22/2012 Time: 0865-7846 OT Time Calculation (min): 27 min  OT Assessment / Plan / Recommendation History of present illness Barbara Clark is a 37 y.o. female presenting with generalized pruritus, stuttering and inability to walk . PMH is significant for type 1 diabetes   Clinical Impression   Pt presents with below problem list. Pt inconsistent in session-Pt having difficulty following commands at times (strength testing), but then able to follow commands other times. Recommending 24/7 supervision upon d/c and will update recommendations as needed.      OT Assessment  Patient needs continued OT Services    Follow Up Recommendations  Supervision/Assistance - 24 hour    Barriers to Discharge      Equipment Recommendations  Other (comment) (tbd)    Recommendations for Other Services    Frequency  Min 2X/week    Precautions / Restrictions Precautions Precautions: Fall Restrictions Weight Bearing Restrictions: No   Pertinent Vitals/Pain No pain reported.     ADL  Grooming: Wash/dry face;Teeth care;Set up;Supervision/safety (several tactile and verbal cues) Where Assessed - Grooming: Supported sitting;Supported standing Lower Body Dressing: Minimal assistance Where Assessed - Lower Body Dressing: Supported sit to stand Toilet Transfer: Charity fundraiser Method: Sit to Barista: Other (comment) (recliner chair) Tub/Shower Transfer Method: Not assessed Equipment Used: Gait belt Transfers/Ambulation Related to ADLs: +2 people for safety for ambulation; Min guard/Min A for transfers. ADL Comments: Pt performed grooming tasks at sink. Pt placed toothbrush in mouth with wrapper on toothbrush-cues to take it off and OT started it for pt and pt then able to take wrapper off. Pt hitting sink at one point versus using  levers to turn on. OT writing down information on piece of paper and pt seemed to understand information better if it was written. Pt telling therapist her left leg was her right leg and same for right leg. Pt able to get socks on and off with cues to do so.     OT Diagnosis: Cognitive deficits  OT Problem List: Impaired balance (sitting and/or standing);Decreased coordination;Decreased cognition;Decreased knowledge of use of DME or AE;Decreased knowledge of precautions OT Treatment Interventions: Self-care/ADL training;DME and/or AE instruction;Therapeutic activities;Cognitive remediation/compensation;Patient/family education;Balance training   OT Goals(Current goals can be found in the care plan section) Acute Rehab OT Goals Patient Stated Goal: none stated OT Goal Formulation: With patient Time For Goal Achievement: 11/29/12 Potential to Achieve Goals: Good ADL Goals Pt Will Perform Grooming: with modified independence;sitting Pt Will Perform Lower Body Bathing: with modified independence;sit to/from stand Pt Will Perform Upper Body Dressing: with modified independence;sitting Pt Will Perform Lower Body Dressing: with modified independence;sit to/from stand Pt Will Transfer to Toilet: with modified independence;ambulating;bedside commode Pt Will Perform Toileting - Clothing Manipulation and hygiene: with modified independence;sit to/from stand;sitting/lateral leans  Visit Information  Last OT Received On: 11/22/12 Assistance Needed: +2 History of Present Illness: Barbara Clark is a 37 y.o. female presenting with generalized pruritus, stuttering and inability to walk . PMH is significant for type 1 diabetes       Prior Functioning     Home Living Family/patient expects to be discharged to:: Private residence Living Arrangements: Spouse/significant other Type of Home: House Home Access: Ramped entrance Home Layout: One level Home Equipment: None Prior Function Level of  Independence: Independent Communication Communication: No difficulties Dominant Hand: Right         Vision/Perception  Cognition  Cognition Arousal/Alertness: Awake/alert Behavior During Therapy: WFL for tasks assessed/performed (pt appeared frustrated at times) Overall Cognitive Status: Difficult to assess (communication difficulties) Area of Impairment: Following commands;Problem solving Following Commands: Follows one step commands inconsistently Problem Solving: Slow processing;Difficulty sequencing;Requires verbal cues;Requires tactile cues General Comments: Pt requiring cues to problem solve through tasks such as opening toothbrush and toothpaste, but then at end of session she unscrewed her lip balm and applied it to her lips.     Extremity/Trunk Assessment Upper Extremity Assessment Upper Extremity Assessment:  (when fine motor coordination tested, pt seemed it was very difficult to perform, but able to open lip balm at end of session no problem) RUE Coordination: decreased fine motor LUE Coordination: decreased fine motor Lower Extremity Assessment Lower Extremity Assessment: Defer to PT evaluation     Mobility Bed Mobility Bed Mobility: Supine to Sit;Sit to Supine;Sitting - Scoot to Edge of Bed Supine to Sit: 5: Supervision Sitting - Scoot to Edge of Bed: 5: Supervision Details for Bed Mobility Assistance: cues to scoot to EOB.  Transfers Transfers: Sit to Stand;Stand to Sit Sit to Stand: 4: Min guard;4: Min assist;From chair/3-in-1;From bed Stand to Sit: 4: Min guard;4: Min assist;To bed;To chair/3-in-1 Details for Transfer Assistance: Min guard for sit to stand from bed and Min guard/Min A to stand from recliner chair (varied).  Cues for hand placement.     Exercise     Balance     End of Session OT - End of Session Equipment Utilized During Treatment: Gait belt Activity Tolerance: Patient tolerated treatment well Patient left: in bed;with call  bell/phone within reach Nurse Communication: Mobility status  GO      Earlie Raveling OTR/L [phone] 11/22/2012, 6:07 PM

## 2012-11-22 NOTE — Progress Notes (Signed)
Family Medicine Teaching Service Daily Progress Note Intern Pager: [phone]  Patient name: Barbara Clark Medical record number: [id number] Date of birth: 1975/08/30 Age: 37 y.o. Gender: female  Primary Care Provider: Janit Pagan, MD Consultants: None Code Status: Full code  Pt Overview and Major Events to Date:  11/3 - MRI normal; Lumbar spine and sacral/coccyx X-rays normal  Assessment and Plan: Barbara Clark is a 37 y.o. female presenting with generalized pruritus, stuttering and inability to walk with new onset left-sided hemi-neglect and stable currently not improving and stable.  # Stuttering and acute inability to walk: appears to be from acute stress reaction vs conversion disorder. There is no evidence of focal weakness on exam when patient is laying down. CT head normal. MRI was normal. - follow-up SLP recommendations regarding speech evaluation - follow-up PT/OT recommendations - consult psychiatry and follow-up recommendations  # Left sided hemi-neglect: new finding overnight.  # Type1 Diabetes: CBG's on admission in the 270's with >1000 glucose in urine. Fasting glucose 241 on 11/21/2012. Got 12 units of aspart for correction in the last 24 hours. Hemoglobin A1C is 8.9. - continue home lantus 30 units and novolog 14 units at meal time - continue insulin sliding scale   # Pruritus: potentially from allergic reaction. Unclear etiology although patient thinking it could from hazelnut in coffee - benadryl as needed  #Fall: X-ray of sacrum/coccyx and lumbar spine was normal  # Leukocytosis: Most likely caused by steroid demargination of WBCs  # Vaginal pruritis: previous history of yeast infections - given fluconazole 150mg  x1  # Hypotension: Resolved. in the 80's systolic on admission. Responded to 2L NS. Current systolic blood pressures in 90s-120 range.   FEN/GI: carb modified diet  Prophylaxis: heparin   #Disposition: discharge pending recommendations of SLP,  PT/OT and psychiatry   Subjective: No events overnight. Not complaining of any pain from her fall. Boyfriend called and says that she has had trouble hearing since yesterday. Ms. Liggett told me that the family dog recently died which, at the time, reminded her of her children's father's death one year ago in 05/04/22. She also voiced frustration with her current situation.  Objective: Temp:  [98 F (36.7 C)-98.8 F (37.1 C)] 98 F (36.7 C) (11/04 0619) Pulse Rate:  [75-97] 75 (11/04 0619) Resp:  [18-20] 18 (11/04 0619) BP: (98-123)/(56-82) 109/72 mmHg (11/04 0619) SpO2:  [97 %-100 %] 97 % (11/04 2725)  Physical Exam: General: Laying comfortably in bed in no acute distress. Intermittently did not respond to questions unless I was in front of her Cardiovascular: RRR, no murmurs Respiratory: clear to auscultation bilaterally Abdomen: soft, non-tender, non-distended Neurology: raised right arm when asked to raise left/right arm. Raised right leg when asked to raise left/right leg. Identified left arm/leg as right arm/leg.   Laboratory:  No recent labs  Imaging/Diagnostic Tests:  MRI Brain (11/21/2012) IMPRESSION: Normal  Lumbar Spine X-ray (11/21/2012) IMPRESSION:  No acute fracture or listhesis identified in the lumbar spine.  Sarcum/Coccyx X-ray (11/21/2012) IMPRESSION:  No acute fracture or dislocation identified about the sacrum or  coccyx.   Jacquelin Hawking, MD 11/22/2012, 6:26 AM PGY-1, [facility] Family Medicine FPTS Intern pager: [phone], text pages welcome

## 2012-11-22 NOTE — Progress Notes (Signed)
Pt refused CBG checked at this time.

## 2012-11-23 LAB — GLUCOSE, CAPILLARY
Glucose-Capillary: 333 mg/dL — ABNORMAL HIGH (ref 70–99)
Glucose-Capillary: 239 mg/dL — ABNORMAL HIGH (ref 70–99)
Glucose-Capillary: 264 mg/dL — ABNORMAL HIGH (ref 70–99)
Glucose-Capillary: 285 mg/dL — ABNORMAL HIGH (ref 70–99)
Glucose-Capillary: 322 mg/dL — ABNORMAL HIGH (ref 70–99)

## 2012-11-23 MED ORDER — DIPHENHYDRAMINE HCL 50 MG/ML IJ SOLN
INTRAMUSCULAR | Status: AC
  Administered 2012-11-23: 25 mg via DENTAL
  Filled 2012-11-23: qty 1

## 2012-11-23 MED ORDER — ESCITALOPRAM OXALATE 10 MG PO TABS
10.0000 mg | ORAL_TABLET | Freq: Every day | ORAL | Status: DC
  Administered 2012-11-23 – 2012-11-25 (×3): 10 mg via ORAL
  Filled 2012-11-23 (×4): qty 1

## 2012-11-23 MED ORDER — SENNOSIDES-DOCUSATE SODIUM 8.6-50 MG PO TABS
2.0000 | ORAL_TABLET | Freq: Every day | ORAL | Status: DC
  Administered 2012-11-23 – 2012-11-26 (×4): 2 via ORAL
  Filled 2012-11-23 (×4): qty 2

## 2012-11-23 MED ORDER — CLONAZEPAM 0.25 MG PO TBDP
0.2500 mg | ORAL_TABLET | Freq: Three times a day (TID) | ORAL | Status: DC
  Administered 2012-11-23: 0.25 mg via ORAL

## 2012-11-23 MED ORDER — POLYETHYLENE GLYCOL 3350 17 G PO PACK
17.0000 g | PACK | Freq: Every day | ORAL | Status: DC
  Administered 2012-11-23 – 2012-11-26 (×4): 17 g via ORAL
  Filled 2012-11-23 (×5): qty 1

## 2012-11-23 MED ORDER — CLONAZEPAM 0.5 MG PO TABS
0.2500 mg | ORAL_TABLET | Freq: Three times a day (TID) | ORAL | Status: DC
  Administered 2012-11-23 – 2012-11-24 (×3): 0.25 mg via ORAL
  Filled 2012-11-23 (×3): qty 1

## 2012-11-23 MED ORDER — DIPHENHYDRAMINE HCL 50 MG/ML IJ SOLN: 25.0000 mg | Freq: Four times a day (QID) | INTRAMUSCULAR | Status: DC | PRN
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
## 2012-11-23 NOTE — Progress Notes (Signed)
Pt c/o of itching all over and tingling of lips,no swelling of tongue,some redness on face and no rashes noted.Md on call notified and new order received.

## 2012-11-23 NOTE — Progress Notes (Signed)
Inpatient Diabetes Program Recommendations  AACE/ADA: New Consensus Statement on Inpatient Glycemic Control (2013)  Target Ranges:  Prepandial:   less than 140 mg/dL      Peak postprandial:   less than 180 mg/dL (1-2 hours)      Critically ill patients:  140 - 180 mg/dL   Reason for Visit: Results for NOVEMBER, SYPHER (MRN [id number]) as of 11/23/2012 12:50  Ref. Range 11/22/2012 13:21 11/22/2012 17:03 11/22/2012 21:33 11/23/2012 08:06 11/23/2012 12:03  Glucose-Capillary Latest Range: 70-99 mg/dL 409 (H) 811 (H) 914 (H) 239 (H) 264 (H)   Note hypoglycemia yesterday after patient received Novolog meal coverage 14 units tidwc.  Please restarting a portion of Novolog meal coverage.  Consider Novolog 8 units tid with meals (hold if patient eats less than 50%). Thanks, Beryl Meager, RN, BC-ADM Inpatient Diabetes Coordinator Pager [phone]

## 2012-11-23 NOTE — Progress Notes (Addendum)
Family Medicine Teaching Service Daily Progress Note Intern Pager: [phone]  Patient name: Barbara Clark Medical record number: [id number] Date of birth: 1975-03-31 Age: 36 y.o. Gender: female  Primary Care Provider: Janit Pagan, MD Consultants: None Code Status: Full code  Pt Overview and Major Events to Date:  11/3 - MRI normal; Lumbar spine and sacral/coccyx X-rays normal  Assessment and Plan: Barbara Clark is a 37 y.o. female presenting with generalized pruritus, stuttering and inability to walk with new onset left-sided hemi-neglect and stable currently not improving and stable.  # Stuttering and acute inability to walk: appears to be from acute stress reaction vs conversion disorder. There is no evidence of focal weakness on exam when patient is laying down. CT head normal. MRI was normal. Has new neurological complaints of decreased hearing - follow-up psychiatry recommendations  # Left sided hemi-neglect  # Type1 Diabetes: CBG's on admission in the 270's with >1000 glucose in urine. Fasting glucose 239 on 11/23/2012. Got 16 units of aspart for correction in the last 24 hours. Hemoglobin A1C is 8.9. Episode of hypoglycemia down to CBG of 56, raised to 190 with soda and peanut butter. Currently on a carb modified diet. - continue home lantus 30 units  - discontinue novolog 14 units at meal time because of low blood sugar yesterday.  - continue insulin sliding scale  # Pruritus: potentially from allergic reaction. Unclear etiology although patient thinking it could from hazelnut in coffee - benadryl as needed  # Leukocytosis: Most likely caused by steroid demargination of WBCs  # Vaginal pruritis: previous history of yeast infections - given fluconazole 150mg  x1  # Hypotension: Resolved. in the 80's systolic on admission. Responded to 2L NS. Current systolic blood pressures in 90s-120 range.   FEN/GI: carb modified diet  Prophylaxis: heparin   #Disposition: discharge  pending psychiatry recommendations   Subjective: Complaints of itching overnight, and got some benadryl. No other problems overnight  Objective: Temp:  [98.3 F (36.8 C)-98.9 F (37.2 C)] 98.3 F (36.8 C) (11/05 0530) Pulse Rate:  [72-89] 72 (11/05 0530) Resp:  [16-20] 16 (11/05 0530) BP: (106-112)/(67-73) 106/67 mmHg (11/05 0530) SpO2:  [100 %] 100 % (11/05 0530)  Physical Exam: General: Laying comfortably in bed in no acute distress Cardiovascular: RRR, no murmurs Respiratory: clear to auscultation bilaterally Abdomen: soft, non-tender, non-distended Neurology: 4/5 upper/lower extremity strength   Laboratory:  No recent labs  Imaging/Diagnostic Tests:  No recent imaging  Jacquelin Hawking, MD 11/23/2012, 8:45 AM PGY-1, [place] [facility] FPTS Intern pager: [phone], text pages welcome

## 2012-11-24 DIAGNOSIS — R4789 Other speech disturbances: Secondary | ICD-10-CM

## 2012-11-24 LAB — CBC
HCT: 37 % (ref 36.0–46.0)
Hemoglobin: 12.6 g/dL (ref 12.0–15.0)
MCH: 30.5 pg (ref 26.0–34.0)
MCHC: 34.1 g/dL (ref 30.0–36.0)
MCV: 89.6 fL (ref 78.0–100.0)
Platelets: 401 10*3/uL — ABNORMAL HIGH (ref 150–400)
RBC: 4.13 MIL/uL (ref 3.87–5.11)
RDW: 12.8 % (ref 11.5–15.5)
WBC: 8.1 10*3/uL (ref 4.0–10.5)

## 2012-11-24 LAB — GLUCOSE, CAPILLARY
Glucose-Capillary: 264 mg/dL — ABNORMAL HIGH (ref 70–99)
Glucose-Capillary: 289 mg/dL — ABNORMAL HIGH (ref 70–99)
Glucose-Capillary: 156 mg/dL — ABNORMAL HIGH (ref 70–99)
Glucose-Capillary: 152 mg/dL — ABNORMAL HIGH (ref 70–99)

## 2012-11-24 MED ORDER — INSULIN ASPART 100 UNIT/ML ~~LOC~~ SOLN: 0.0000 [IU] | Freq: Every day | SUBCUTANEOUS | Status: DC

## 2012-11-24 MED ORDER — INSULIN ASPART 100 UNIT/ML ~~LOC~~ SOLN
0.0000 [IU] | Freq: Three times a day (TID) | SUBCUTANEOUS | Status: DC
  Administered 2012-11-24: 5 [IU] via SUBCUTANEOUS
  Administered 2012-11-24: 2 [IU] via SUBCUTANEOUS
  Administered 2012-11-25: 5 [IU] via SUBCUTANEOUS
  Administered 2012-11-25: 3 [IU] via SUBCUTANEOUS
  Administered 2012-11-26: 5 [IU] via SUBCUTANEOUS
  Administered 2012-11-26: 7 [IU] via SUBCUTANEOUS

## 2012-11-24 MED ORDER — IBUPROFEN 600 MG PO TABS
600.0000 mg | ORAL_TABLET | Freq: Four times a day (QID) | ORAL | Status: DC | PRN
  Administered 2012-11-25 – 2012-11-26 (×5): 600 mg via ORAL
  Filled 2012-11-24 (×5): qty 1

## 2012-11-24 MED ORDER — INSULIN ASPART 100 UNIT/ML ~~LOC~~ SOLN
8.0000 [IU] | Freq: Three times a day (TID) | SUBCUTANEOUS | Status: DC
  Administered 2012-11-24 – 2012-11-26 (×8): 8 [IU] via SUBCUTANEOUS

## 2012-11-24 MED ORDER — INSULIN ASPART 100 UNIT/ML ~~LOC~~ SOLN: 0.0000 [IU] | Freq: Three times a day (TID) | SUBCUTANEOUS | Status: DC

## 2012-11-24 NOTE — Progress Notes (Signed)
Requested by daughter to speak with me. Spoke with patient, daughter and boyfriend earlier this evening about their concerns about discharge plans. They feel they are not ready/have not had time to make plans to take the patient home and give her 24 hour care/supervision. They also need to understand the reason for her symptoms and the follow up plan after she is discharged. They are trying to make plans to have someone be with the patient at home and also need some education on how to care for her (transferring her, giving her insulin, checking her blood glucose). Updated the RN caring for the patient and also updated the charge nurse. Sherran Needs, RN, Arts administrator, 6N.

## 2012-11-24 NOTE — Progress Notes (Signed)
Family Medicine Teaching Service Daily Progress Note Intern Pager: [phone]  Patient name: Barbara Clark Medical record number: [id number] Date of birth: 17-Nov-1975 Age: 37 y.o. Gender: female  Primary Care Provider: Janit Pagan, MD Consultants: None Code Status: Full code  Pt Overview and Major Events to Date:  11/3 - MRI normal; Lumbar spine and sacral/coccyx X-rays normal 11/5 - Psychiatry saw patient and diagnosed PTSD and depression  Assessment and Plan: [name] is a 37 y.o. female presenting with generalized pruritus, stuttering and inability to walk with new onset left-sided hemi-neglect and stable currently not improving and stable.  # Stuttering and acute inability to walk: appears to be from acute stress reaction vs conversion disorder. There is no evidence of focal weakness on exam when patient is laying down. CT head normal. MRI was normal. Has new neurological complaints of decreased hearing. Psychiatry started klonipin and lexapro for anxiety and depression. - will discontinue Klonipin because of high risk of fall  # Left sided hemi-neglect  # Type1 Diabetes: CBG's on admission in the 270's with >1000 glucose in urine. Fasting glucose 264 on 11/24/2012. She received 15 units of aspart for correction in the last 24 hours. CBGs have been in the 200-300 range. Hemoglobin A1C is 8.9. - continue home lantus 30 units  - continue sliding scale insulin - start insulin 8 units for meal coverage  # Pruritus: potentially from allergic reaction. Unclear etiology although patient thinking it could from hazelnut in coffee - benadryl as needed  # Leukocytosis: Most likely caused by steroid demargination of WBCs  # Vaginal pruritis: previous history of yeast infections - given fluconazole 150mg  x1  # Hypotension: Resolved. in the 80's systolic on admission. Responded to 2L NS. Current systolic blood pressures in 90s-120 range.   FEN/GI: carb modified diet  Prophylaxis:  heparin   #Disposition: discharge pending placement allowing for 24-hour supervision   Subjective: No problems overnight. Only wants her boyfriend and daughter to be informed regarding update to her medical management. Psychiatry saw patient yesterday and started Klonipin TID and Lexapro for anxiety and depression. [name] informed me that she has had a diagnosis of depression in the past and was started on medication that was subsequently stopped because of behavioral changes.  Objective: Temp:  [97.9 F (36.6 C)-98.8 F (37.1 C)] 98 F (36.7 C) (11/06 0545) Pulse Rate:  [79-83] 83 (11/06 0545) Resp:  [17-20] 20 (11/06 0545) BP: (96-111)/(64-71) 96/64 mmHg (11/06 0545) SpO2:  [97 %-99 %] 97 % (11/06 0545)  Physical Exam: General: Laying comfortably in bed in no acute distress, stuttering Cardiovascular: RRR, no murmurs Respiratory: clear to auscultation bilaterally Abdomen: soft, non-tender, non-distended  Laboratory:  No recent labs  Imaging/Diagnostic Tests:  No recent imaging  Jacquelin Hawking, MD 11/24/2012, 8:07 AM PGY-1, [place] [facility] FPTS Intern pager: [phone], text pages welcome

## 2012-11-24 NOTE — Progress Notes (Signed)
Notified social worker that physician and family were wanting placement to SNF for pt.  Already heard back from social work and notified MD on call that placement is not an option.  Will continue to monitor. Vanice Sarah

## 2012-11-24 NOTE — Progress Notes (Signed)
Occupational Therapy Treatment Patient Details Name: Barbara Clark MRN: [id number] DOB: 1975-06-23 Today's Date: 11/24/2012 Time: 3086-5784 OT Time Calculation (min): 24 min  OT Assessment / Plan / Recommendation  History of present illness Barbara Clark is a 37 y.o. female presenting with generalized pruritus, stuttering and inability to walk . PMH is significant for type 1 diabetes   OT comments  Pt inconsistent with level of assist required during ADLs and ADL mobility. Pt continues to demo decreased safety awareness. Pt is pleasant and cooperative and should continue with acute OT services to increase level of function and safety to return home safely  Follow Up Recommendations  Supervision/Assistance - 24 hour    Barriers to Discharge   none    Equipment Recommendations  Other (comment) (TBD)    Recommendations for Other Services    Frequency Min 2X/week   Progress towards OT Goals Progress towards OT goals: OT to reassess next treatment  Plan Discharge plan remains appropriate    Precautions / Restrictions Precautions Precautions: Fall Restrictions Weight Bearing Restrictions: No   Pertinent Vitals/Pain 7/10 abdomen    ADL  Grooming: Wash/dry face;Teeth care;Set up;Supervision/safety Where Assessed - Grooming: Unsupported sitting Lower Body Bathing: Simulated;Minimal assistance;Moderate assistance;Maximal assistance Where Assessed - Lower Body Bathing: Unsupported sitting;Supported sit to stand;Supported standing Lower Body Dressing: Minimal assistance;Performed;Min guard Where Assessed - Lower Body Dressing: Unsupported sitting Toilet Transfer: Min guard;Minimal assistance Toilet Transfer Method: Sit to Barista: Bedside commode Toileting - Clothing Manipulation and Hygiene: Performed;Minimal assistance Tub/Shower Transfer Method: Not assessed Equipment Used: Gait belt;Other (comment) (BSC) Transfers/Ambulation Related to ADLs: Varying  effort and ability with every transfer, at times able to perform with min A, other times requiring varying levels of assist, No consistent trend of function throughout session. Pt believing she could ambulate to sink while leaning to R side and holding onto furniture ADL Comments: pt attempting to donn sock on her L foot that already had sock on it    OT Diagnosis:    OT Problem List:   OT Treatment Interventions:     OT Goals(current goals can now be found in the care plan section) Acute Rehab OT Goals Patient Stated Goal: none stated  Visit Information  Last OT Received On: 11/24/12 Assistance Needed: +2 History of Present Illness: Barbara Clark is a 37 y.o. female presenting with generalized pruritus, stuttering and inability to walk . PMH is significant for type 1 diabetes    Subjective Data      Prior Functioning  Home Living Family/patient expects to be discharged to:: Private residence Living Arrangements: Spouse/significant other Type of Home: House Home Access: Ramped entrance Home Layout: One level Home Equipment: None Prior Function Level of Independence: Independent Communication Communication: No difficulties Dominant Hand: Right    Cognition  Cognition Arousal/Alertness: Awake/alert Behavior During Therapy: WFL for tasks assessed/performed Overall Cognitive Status: Difficult to assess Area of Impairment: Following commands;Safety/judgement;Awareness;Problem solving Memory: Decreased short-term memory Following Commands: Follows one step commands inconsistently;Follows multi-step commands inconsistently Safety/Judgement: Decreased awareness of safety;Decreased awareness of deficits Problem Solving: Slow processing;Decreased initiation;Difficulty sequencing;Requires verbal cues;Requires tactile cues General Comments: Pt requiring cues to problem solve through tasks such as opening toothbrush and toothpaste, but then at end of session she unscrewed her lip balm  and applied it to her lips. Pt attemtping to put sock on foot that already had sock on it    Mobility  Bed Mobility Bed Mobility: Supine to Sit;Sitting - Scoot to Edge of Bed;Sit to Supine  Supine to Sit: 5: Supervision Sitting - Scoot to Edge of Bed: 5: Supervision Details for Bed Mobility Assistance: upon entering the room patient was performing bed mobility independently sitting up and coming to EOB.  Transfers Transfers: Sit to Stand;Stand to Sit Sit to Stand: 4: Min assist;2: Max assist;From bed;From chair/3-in-1 Stand to Sit: 4: Min assist;3: Mod assist;To bed;To chair/3-in-1 Details for Transfer Assistance: Varying effort and ability with every transfer, at times able to perform with min A, other times requiring varying levels of assist, No consistent trend of function throughout session. Pt believing she could ambulate to sink while leaning to R side and holding onto furniture    Exercises      Balance Balance Balance Assessed: Yes Dynamic Sitting Balance Dynamic Sitting - Balance Support: No upper extremity supported;During functional activity Dynamic Sitting - Level of Assistance: 5: Stand by assistance Dynamic Standing Balance Dynamic Standing - Balance Support: Left upper extremity supported;During functional activity Dynamic Standing - Level of Assistance: 2: Max assist   End of Session OT - End of Session Equipment Utilized During Treatment: Gait belt;Other (comment) (BSC) Activity Tolerance: Patient tolerated treatment well Patient left: in bed;with call bell/phone within reach  GO     Galen Manila 11/24/2012, 1:58 PM

## 2012-11-24 NOTE — Progress Notes (Addendum)
11-25-12 Family still working on arranging 24 hour supervision at home.    Patient does not have an qualifying DX for Medicaid to pay for home health PT/ OT , however, would pay for home health RN . Dr Caleb Popp aware . Left an message for Dr Jacky Kindle also . Ronny Flurry RN BSN    11-24-12 Spoke with patient and family provided private duty list . Ronny Flurry RN BSN [phone]

## 2012-11-24 NOTE — Clinical Social Work Note (Addendum)
Update: CSW spoke with MD regarding discharge disposition. CSW and CSW Chiropodist reviewd clinical information for pt, and at this time CSW still unable to provide LOG for SNF placement. MD, RN, and CMRN made aware of information above. CSW signing off. Please re consult if discharge disposition changes.  CSW received consult for possible SNF placement for pt once medically discharged from [facility]. CSW consulted with CSW Chiropodist in regards to this consult due to pt having Medicaid [place] as their insurance, which would make pt a LOG (letter of guarantee) pt. Due to pt being ambulatory and the need for SNF placement is for 24/hour supervision only, CSW unable to offer LOG for SNF placement. CSW followed up with medical staff regarding information above.   CSW signing off. Please re consult if discharge disposition changes.  Darlyn Chamber, LCSWA Clinical Social Worker [phone]

## 2012-11-24 NOTE — Consult Note (Signed)
NEURO HOSPITALIST CONSULT NOTE    Reason for Consult: stuttering speech  HPI:                                                                                                                                          Barbara Clark is an 37 y.o. female who was brought to hospital after questionable allergic reaction.  After arriving to hospital daughter states she had a syncopal episode and then was unable to speak.  She has been exhibiting stuttering speech and and patient is able to communicate well with witting.  Patient feels her memory is worse since fall and when tries to talk she starts everything with "nanananan" and Fafafafa".  This is inconsistent as if she gets frustrated she states some words with clear phonation. She has had a MRI brain which showed no abnormalities. She is moving all extremities. She does not complain of gait instability.   Past Medical History  Diagnosis Date  . Diabetes mellitus     Type I  . Depression   . Anxiety   . HSV-2 (herpes simplex virus 2) infection 2012    serology    Past Surgical History  Procedure Laterality Date  . Tubal ligation      Family History  Problem Relation Age of Onset  . Diabetes Mother   . Hypertension Mother   . Diabetes Maternal Grandmother      Social History:  reports that she has been smoking Cigarettes.  She has been smoking about 1.00 pack per day. She has never used smokeless tobacco. She reports that she drinks alcohol. She reports that she does not use illicit drugs.  No Known Allergies  MEDICATIONS:                                                                                                                     Scheduled: . escitalopram  10 mg Oral QHS  . heparin  5,000 Units Subcutaneous Q8H  . insulin aspart  0-5 Units Subcutaneous QHS  . insulin aspart  0-9 Units Subcutaneous TID WC  . insulin aspart  8 Units Subcutaneous TID WC  . insulin glargine  30 Units Subcutaneous QHS  .  polyethylene glycol  17 g Oral Daily  . senna-docusate  2 tablet  Oral Daily  . sodium chloride  1,000 mL Intravenous Once     ROS:                                                                                                                                       History obtained from unobtainable from patient due to language barrier   Blood pressure 106/75, pulse 88, temperature 98 F (36.7 C), temperature source Oral, resp. rate 20, height 5\' 7"  (1.702 m), weight 78.019 kg (172 lb), last menstrual period 10/28/2012, SpO2 100.00%.   Neurologic Examination:                                                                                                      Mental Status: Alert, oriented, thought content appropriate.  Speech functional at times as noted above but when distracted or frustrated with phonate well.  Able to follow 3 step commands without difficulty. Cranial Nerves: II: Discs flat bilaterally; Visual fields grossly normal, pupils equal, round, reactive to light and accommodation III,IV, VI: ptosis not present, extra-ocular motions intact bilaterally V,VII: smile symmetric, facial light touch sensation normal bilaterally VIII: hearing normal bilaterally IX,X: gag reflex present XI: bilateral shoulder shrug XII: midline tongue extension without atrophy or fasciculations  Motor: Right : Upper extremity   5/5    Left:     Upper extremity   5/5  Lower extremity   5/5     Lower extremity   5/5 --Exam is very effort dependent, but when distracted shows good strength and no lateralization.  Tone and bulk:normal tone throughout; no atrophy noted Sensory: Pinprick and light touch intact throughout, bilaterally Deep Tendon Reflexes:  Right: Upper Extremity   Left: Upper extremity   biceps (C-5 to C-6) 2/4   biceps (C-5 to C-6) 2/4 tricep (C7) 2/4    triceps (C7) 2/4 Brachioradialis (C6) 2/4  Brachioradialis (C6) 2/4  Lower Extremity Lower Extremity  quadriceps (L-2 to L-4)  2/4   quadriceps (L-2 to L-4) 2/4 Achilles (S1) 2/4   Achilles (S1) 2/4  Plantars: Right: downgoing   Left: downgoing Cerebellar: normal finger-to-nose,  normal heel-to-shin test Gait: able to walks without assistance, showing astasia/abasia quality gait.  Able to stand with feet together, negative Romberg.  CV: pulses palpable throughout    Lab Results  Component Value Date/Time   CHOL 148 08/19/2010  9:38 AM    Results for orders placed during the hospital encounter of 11/20/12 (from the past 48 hour(s))  GLUCOSE,  CAPILLARY     Status: Abnormal   Collection Time    11/22/12  5:03 PM      Result Value Range   Glucose-Capillary 353 (*) 70 - 99 mg/dL  GLUCOSE, CAPILLARY     Status: Abnormal   Collection Time    11/22/12  9:33 PM      Result Value Range   Glucose-Capillary 333 (*) 70 - 99 mg/dL  GLUCOSE, CAPILLARY     Status: Abnormal   Collection Time    11/23/12  8:06 AM      Result Value Range   Glucose-Capillary 239 (*) 70 - 99 mg/dL   Comment 1 Notify RN    GLUCOSE, CAPILLARY     Status: Abnormal   Collection Time    11/23/12 12:03 PM      Result Value Range   Glucose-Capillary 264 (*) 70 - 99 mg/dL   Comment 1 Notify RN    GLUCOSE, CAPILLARY     Status: Abnormal   Collection Time    11/23/12  5:45 PM      Result Value Range   Glucose-Capillary 285 (*) 70 - 99 mg/dL  GLUCOSE, CAPILLARY     Status: Abnormal   Collection Time    11/23/12  9:23 PM      Result Value Range   Glucose-Capillary 322 (*) 70 - 99 mg/dL   Comment 1 Documented in Chart     Comment 2 Notify RN    GLUCOSE, CAPILLARY     Status: Abnormal   Collection Time    11/24/12  7:34 AM      Result Value Range   Glucose-Capillary 264 (*) 70 - 99 mg/dL  CBC     Status: Abnormal   Collection Time    11/24/12 11:55 AM      Result Value Range   WBC 8.1  4.0 - 10.5 K/uL   RBC 4.13  3.87 - 5.11 MIL/uL   Hemoglobin 12.6  12.0 - 15.0 g/dL   HCT 09.8  11.9 - 14.7 %   MCV 89.6  78.0 - 100.0 fL   MCH  30.5  26.0 - 34.0 pg   MCHC 34.1  30.0 - 36.0 g/dL   RDW 82.9  56.2 - 13.0 %   Platelets 401 (*) 150 - 400 K/uL  GLUCOSE, CAPILLARY     Status: Abnormal   Collection Time    11/24/12 12:01 PM      Result Value Range   Glucose-Capillary 289 (*) 70 - 99 mg/dL    No results found.   Assessment/Plan: 37 YO female presenting to hospital after allergic reaction causing facial edema and possible syncopal episode.  While in hospital she has shown stuttering speech which is not consistent and functional in quality. MRI brain has been obtained and shows no intracranial etiology for speech difficulties.  Upon walking patient she shows inconstant gait with astasia/abasia quality. Etiology of patients symptoms likely stress related.  No further neurological workup recommended.    Recommend: 1) PT/OT   Wyatt Portela, MD [facility] [phone]
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
## 2012-11-24 NOTE — Progress Notes (Signed)
FMTS Attending Note Patient seen and examined by me today; she continues to stutter, although her receptive speech appears intact.  PT note reviewed, appears she is making some progress but continued recommendation for 24 hour supervision.   Patient is medically stable for discharge once a suitable outpatient destination is found for her (either home with 24 hour supervision, or other supervised environment).  For CM and/or CSW to assist in this effort.  I agree with Dr Caleb Popp in holding BNZ in this patient with gait instability and concerns for falls.  She is to be started on Lexapro alone, which may be helpful for her anxiety as well. Disposition upon resolution of 24-hour supervision at discharge.  Paula Compton, MD

## 2012-11-24 NOTE — Progress Notes (Signed)
Physical Therapy Treatment Patient Details Name: Barbara Clark MRN: [id number] DOB: [birth year] Today's Date: 11/24/2012 Time: 1191-4782 PT Time Calculation (min): 24 min  PT Assessment / Plan / Recommendation  History of Present Illness Paulett Kaufhold is a 37 y.o. female presenting with generalized pruritus, stuttering and inability to walk . PMH is significant for type 1 diabetes   PT Comments   Patient demonstrates improvements in activity tolerance today with ambulation. Patient may benefit from use of RW for continued ambulation, will attempt next session. Patient did express dizziness during ambulation.  Continue to recommend 24/7 physical assist upon discharge.   Follow Up Recommendations  Supervision/Assistance - 24 hour           Equipment Recommendations  None recommended by PT       Frequency Min 3X/week   Progress towards PT Goals Progress towards PT goals: Progressing toward goals  Plan Current plan remains appropriate    Precautions / Restrictions Precautions Precautions: Fall Restrictions Weight Bearing Restrictions: No   Pertinent Vitals/Pain Patient reports dizziness with ambulation. No pain reported initially, right knee pain reported during activity.    Mobility  Bed Mobility Bed Mobility: Supine to Sit;Sitting - Scoot to Edge of Bed;Sit to Supine Details for Bed Mobility Assistance: upon entering the room patient was performing bed mobility independently sitting up and coming to EOB.  Transfers Transfers: Sit to Stand;Stand to [employer] Transfers Sit to Stand: 4: Min guard;4: Min assist;2: Max assist;From bed;From chair/3-in-1 Stand to Sit: 4: Min guard;4: Min assist;3: Mod assist;To bed;To chair/3-in-1 Stand Pivot Transfers: 7: Independent Details for Transfer Assistance: Varying effort and ability with every transfer, at times able to perform independently, other times requiring varying levels of assist, No consistent trend of function throughout  session. Ambulation/Gait Ambulation/Gait Assistance: 4: Min assist Ambulation Distance (Feet): 35 Feet Assistive device: 2 person hand held assist Ambulation/Gait Assistance Details: May attempt use of RW during next session. Patient continues to report dizziness with ambulation General Gait Details: Patient with improved posture during ambulation today.  Continues to report dizziness and pain in right knee stopping her from continuing further. May try use of RW next session.        PT Diagnosis: Abnormality of gait;Altered mental status  PT Problem List: Decreased activity tolerance;Decreased mobility;Decreased cognition;Decreased safety awareness PT Treatment Interventions: Gait training;Functional mobility training;Therapeutic activities;Therapeutic exercise;Balance training;Patient/family education   PT Goals (current goals can now be found in the care plan section) Acute Rehab PT Goals Patient Stated Goal: none stated PT Goal Formulation: Patient unable to participate in goal setting Time For Goal Achievement: 12/06/12 Potential to Achieve Goals: Fair  Visit Information  Last PT Received On: 11/24/12 Assistance Needed: +2 History of Present Illness: Charma Mocarski is a 37 y.o. female presenting with generalized pruritus, stuttering and inability to walk . PMH is significant for type 1 diabetes    Subjective Data  Patient Stated Goal: none stated   Cognition  Cognition Arousal/Alertness: Awake/alert Behavior During Therapy: WFL for tasks assessed/performed Overall Cognitive Status: Difficult to assess Area of Impairment: Following commands;Safety/judgement;Awareness;Problem solving Memory: Decreased short-term memory Following Commands: Follows one step commands inconsistently;Follows multi-step commands inconsistently Problem Solving: Slow processing;Decreased initiation;Difficulty sequencing;Requires verbal cues;Requires tactile cues       End of Session PT - End of  Session Equipment Utilized During Treatment: Gait belt Activity Tolerance: Patient limited by fatigue Patient left: in bed;with call bell/phone within reach;with bed alarm set Nurse Communication: Mobility status   GP  Fabio Asa 11/24/2012, 10:30 AM Charlotte Crumb, PT DPT  [phone]

## 2012-11-25 DIAGNOSIS — F339 Major depressive disorder, recurrent, unspecified: Secondary | ICD-10-CM

## 2012-11-25 LAB — GLUCOSE, CAPILLARY
Glucose-Capillary: 288 mg/dL — ABNORMAL HIGH (ref 70–99)
Glucose-Capillary: 250 mg/dL — ABNORMAL HIGH (ref 70–99)
Glucose-Capillary: 107 mg/dL — ABNORMAL HIGH (ref 70–99)
Glucose-Capillary: 106 mg/dL — ABNORMAL HIGH (ref 70–99)

## 2012-11-25 MED ORDER — INSULIN ASPART 100 UNIT/ML ~~LOC~~ SOLN: 8.0000 [IU] | Freq: Three times a day (TID) | SUBCUTANEOUS | Status: DC

## 2012-11-25 MED ORDER — ESCITALOPRAM OXALATE 10 MG PO TABS: 10.0000 mg | ORAL_TABLET | Freq: Every day | ORAL | Status: DC

## 2012-11-25 NOTE — Discharge Summary (Signed)
Family Medicine [facility] Discharge Summary  Patient name: Barbara Clark Medical record number: [id number] Date of birth: 1975/12/17 Age: 37 y.o. Gender: female Date of Admission: 11/20/2012  Date of Discharge: 11/25/2012 Admitting Physician: Nestor Ramp, MD  Primary Care Provider: Janit Pagan, MD Consultants: Psychiatry, Neurology, PT, OT, SLP  Indication for Hospitalization: Sto  Discharge Diagnoses/Problem List:  1. Depression 2. Anxiety 3. Possible conversion disorder 4. Diabetes Mellitus, Type 1  Disposition: Discharge to SNF  Discharge Condition: Stable  Discharge Exam:  General: Laying comfortably in bed in no acute distress, stuttering has improved overall  Cardiovascular: RRR, no murmurs  Respiratory: clear to auscultation bilaterally Abdomen: soft, non-tender, non-distended  Neuro: Alert, oriented, able to follow commands with increased explanation, UE strength 3-4/5 and LE strength 3-4/5. Dorsi/plantarflexion 3-4/5 bilaterally  Brief Hospital Course: Ms. Sandiford was admitted, after being treated in the ED for pruritis with benadryl and solu-medrol, for developing severe stuttering and the inability to walk. CT and MRI head showed no acute ischemia or hemorrhage. She had systolic blood pressures in the 80s that responded well to 2L NS. Her white count was elevated at 18k, most likely from steroid demargination. During admission, she developed many neurological deficits that could not be explained by any organic causes. PT/OT/SLP assessed patient with patient receiving ongoing PT service. Recommended 24 hour supervision for mobility/out of bed and possibly a rolling walker with 5" wheels. Psychiatry was consulted with concern for acute stress reaction vs conversion disorder. Psychiatry assessed patient with Depression and Anxiety and started Lexapro and Klonipin. Klonipin was eventually changed to Buspirone due to concern for increased fall risk. Neurology was  consulted for continued concern of neurological symptoms and recommended continued PT/OT. Home insulin regimen was continued on admission with added sliding scale insulin and patient had one episode of hypoglycemia to 59. She received juice, soda and peanut butter which raised blood sugar to 190. Meal correction was held for one day and restarted at lower dose of 8 units. During admission she had a fall with concern of injury to her pelvis and lower back. X-rays did not reveal fractures. WBC 8.1 prior to discharge. SNF placement recommended because patient is not able to find anyone to provide 24 hour supervision at home.  Issues for Follow Up:  1. Psychiatry follow-up, consider CBT 2. Concern for depression. Started on SSRI 3. 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 Control of blood sugar  Significant Procedures: None  Significant Labs and Imaging:   Recent Labs Lab 11/20/12 1700 11/21/12 0517 11/24/12 1155  WBC 13.2* 18.4* 8.1  HGB 11.4* 10.4* 12.6  HCT 34.0* 30.6* 37.0  PLT 368 349 401*    Recent Labs Lab 11/20/12 0830 11/20/12 1700 11/21/12 0517  NA 134*  --  134*  K 3.9  --  3.9  CL 102  --  104  CO2 21  --  23  GLUCOSE 274*  --  347*  BUN 9  --  11  CREATININE 0.63 0.55 0.59  CALCIUM 8.4  --  8.4   CT head: 11/20/12  FINDINGS:  Ventricular system normal in size and appearance for age. No mass  lesion. No midline shift. No acute hemorrhage or hematoma. No  extra-axial fluid collections. No evidence of acute infarction. No  focal brain parenchymal abnormalities. No significant interval  change intracranially.  No focal osseous abnormality involving the skull. Mild mucosal  thickening in scattered ethmoid air cells bilaterally. Remaining  visualized paranasal sinuses,  bilateral mastoid air cells, and  bilateral middle ear cavities well-aerated.  IMPRESSION:  1. Normal and stable intracranially.  2. Minimal to mild chronic bilateral ethmoid sinus  disease.   MRI Brain (11/21/2012)  IMPRESSION:  Normal   Lumbar Spine X-ray (11/21/2012)  IMPRESSION:  No acute fracture or listhesis identified in the lumbar spine.   Sarcum/Coccyx X-ray (11/21/2012)  IMPRESSION:  No acute fracture or dislocation identified about the sacrum or  coccyx.   Results/Tests Pending at Time of Discharge: None  Discharge Medications:    Medication List         escitalopram 10 MG tablet  Commonly known as:  LEXAPRO  Take 1 tablet (10 mg total) by mouth at bedtime.     insulin aspart 100 UNIT/ML injection  Commonly known as:  novoLOG  Inject 8 Units into the skin 3 (three) times daily with meals.     insulin glargine 100 UNIT/ML injection  Commonly known as:  LANTUS  Inject 30 Units into the skin at bedtime.        Discharge Instructions: Please refer to Patient Instructions section of EMR for full details.  Patient was counseled important signs and symptoms that should prompt return to medical care, changes in medications, dietary instructions, activity restrictions, and follow up appointments.   Follow-Up Appointments: Follow-up Information   Follow up with Janit Pagan, MD. Call in 2 days.   Specialty:  Family Medicine   Contact information:   [address] [place] [address] [phone]       Follow up with [facility]. Call in 2 days.   Contact information:   [address] [address] [place] [address] [phone]      Jacquelin Hawking, MD 11/25/2012, 2:21 PM PGY-1, [facility]

## 2012-11-25 NOTE — Progress Notes (Signed)
Inpatient Diabetes Program Recommendations  AACE/ADA: New Consensus Statement on Inpatient Glycemic Control (2013)  Target Ranges:  Prepandial:   less than 140 mg/dL      Peak postprandial:   less than 180 mg/dL (1-2 hours)      Critically ill patients:  140 - 180 mg/dL   Reason for Visit: Results for Barbara Clark, Barbara Clark (MRN [id number]) as of 11/25/2012 13:25  Ref. Range 11/24/2012 12:01 11/24/2012 16:51 11/24/2012 21:53 11/25/2012 07:34 11/25/2012 11:51  Glucose-Capillary Latest Range: 70-99 mg/dL 409 (H) 811 (H) 914 (H) 288 (H) 250 (H)   Consider increasing Lantus to 35 units q HS.   Thanks, Beryl Meager, RN, BC-ADM Inpatient Diabetes Coordinator Pager [phone]

## 2012-11-25 NOTE — Progress Notes (Signed)
Family Medicine Teaching Service Daily Progress Note Intern Pager: [phone]  Patient name: Barbara Clark Medical record number: [id number] Date of birth: [birth year] Age: 37 y.o. Gender: female  Primary Care Provider: Janit Pagan, MD Consultants: Psychiatry, Neurology Code Status: Full code  Pt Overview and Major Events to Date:  11/3 - MRI normal; Lumbar spine and sacral/coccyx X-rays normal 11/5 - Psychiatry saw patient and diagnosed PTSD and depression  Assessment and Plan: Barbara Clark is a 37 y.o. female presenting with generalized pruritus, stuttering and inability to walk with new onset left-sided hemi-neglect and stable currently not improving and stable.  # Stuttering and acute inability to walk: appears to be from acute stress reaction vs conversion disorder. There is no evidence of focal weakness on exam when patient is laying down. CT head normal. MRI was normal. Has new neurological complaints of decreased hearing. Psychiatry started buspirone for anxiety. Appreciated psychiatry recommendations. Neuro recommends continued PT/OT. - follow up on progress of finding someone to provide 24 hour supervision  # Type1 Diabetes: CBG's on admission in the 270's with >1000 glucose in urine. Fasting glucose 264 on 11/24/2012. She received 12 units of aspart for blood correction in the last 24 hours. CBGs have been in the 200-300 range. Hemoglobin A1C is 8.9. - continue home lantus 30 units  - continue sliding scale insulin - continue insulin 8 units for meal coverage  # Depression - continue lexapro  # Anxiety - continue buspirone  # Pruritus: potentially from allergic reaction. Unclear etiology although patient thinking it could from hazelnut in coffee - benadryl as needed  # Leukocytosis: Resolved  # Vaginal pruritis: previous history of yeast infections - given fluconazole 150mg  x1  # Hypotension: Resolved. in the 80's systolic on admission. Responded to 2L NS. Current  systolic blood pressures in 90s-120 range.   FEN/GI: carb modified diet  Prophylaxis: heparin   #Disposition: discharge pending ability to receive 24-hour supervision outside the hospital   Subjective: Barbara Clark is frustrated today because of her situation and what is being done about it. Her stuttering is improving. Her family is trying to find someone supervise her at home, but they haven't been successful yet.  Objective: Temp:  [98 F (36.7 C)-98.1 F (36.7 C)] 98.1 F (36.7 C) (11/07 0550) Pulse Rate:  [77-88] 77 (11/07 0550) Resp:  [16-20] 16 (11/07 0550) BP: (91-106)/(59-75) 96/59 mmHg (11/07 0550) SpO2:  [96 %-100 %] 96 % (11/07 0550)  Physical Exam: General: Laying comfortably in bed in no acute distress, stuttering has improved overall Cardiovascular: RRR, no murmurs Respiratory: clear to auscultation bilaterally Abdomen: soft, non-tender, non-distended Neuro:  Alert, oriented, able to follow commands with increased explanation, UE strength 3-4/5 and LE strength 3-4/5. Dorsi/plantarflexion 3-4/5 bilaterally  Laboratory:   CBC    Component Value Date/Time   WBC 8.1 11/24/2012 1155   RBC 4.13 11/24/2012 1155   HGB 12.6 11/24/2012 1155   HCT 37.0 11/24/2012 1155   PLT 401* 11/24/2012 1155   MCV 89.6 11/24/2012 1155   MCH 30.5 11/24/2012 1155   MCHC 34.1 11/24/2012 1155   RDW 12.8 11/24/2012 1155   LYMPHSABS 4.2* 11/20/2012 0830   MONOABS 0.7 11/20/2012 0830   EOSABS 0.0 11/20/2012 0830   BASOSABS 0.0 11/20/2012 0830    Imaging/Diagnostic Tests:  No recent imaging  Barbara Hawking, MD 11/25/2012, 6:38 AM PGY-1, [facility] FPTS Intern pager: [phone], text pages welcome

## 2012-11-25 NOTE — Clinical Social Work Psychosocial (Signed)
    Clinical Social Work Department BRIEF PSYCHOSOCIAL ASSESSMENT 11/25/2012  Patient:  EVAMARIA, DETORE     Account Number:  [id number]     Admit date:  11/20/2012  Clinical Social Worker:  Gretta Cool, Chiropodist CSW  Date/Time:  11/25/2012 03:00 PM  Referred by:  CSW  Date Referred:  11/25/2012 Referred for  SNF Placement   Other Referral:   Interview type:  Patient Other interview type:    PSYCHOSOCIAL DATA Living Status:  ALONE Admitted from facility:   Level of care:   Primary support name:  Catarina Hartshorn Primary support relationship to patient:  FRIEND Degree of support available:   limited support    CURRENT CONCERNS Current Concerns  Post-Acute Placement   Other Concerns:    SOCIAL WORK ASSESSMENT / PLAN Met with Ms. Kley and her daughter Demetrious to discuss discharge planning. At this time, the patient is agreeable to a SNF search. We will provide a disposion and follow up this afternoon.   Assessment/plan status:  Information/Referral to [facility] Other assessment/ plan:   Information/referral to community resources:   SNF resources    PATIENT'S/FAMILY'S RESPONSE TO PLAN OF CARE: Ms. Brissett was anxious about how long she would be able to stay at the nursing home and the cost. These concerns were addressed.

## 2012-11-25 NOTE — Progress Notes (Signed)
Physical Therapy Treatment Patient Details Name: Barbara Clark MRN: [id number] DOB: 30-Jun-1975 Today's Date: 11/25/2012 Time: 4098-1191 PT Time Calculation (min): 33 min  PT Assessment / Plan / Recommendation  History of Present Illness Barbara Clark is a 37 y.o. female presenting with generalized pruritus, stuttering and inability to walk . PMH is significant for type 1 diabetes   PT Comments   Pt. Initially participatory and attempting to verbalize, but by end of session more inwardly focused and disengaged from the moment.  Greatly increased time needed to complete functional taskd along with cueing and direction.    Follow Up Recommendations  Supervision/Assistance - 24 hour;Supervision for mobility/OOB     Does the patient have the potential to tolerate intense rehabilitation     Barriers to Discharge        Equipment Recommendations  Rolling walker with 5" wheels (may need; will determine closer to DC)    Recommendations for Other Services    Frequency Min 3X/week   Progress towards PT Goals Progress towards PT goals: Progressing toward goals  Plan Current plan remains appropriate    Precautions / Restrictions Precautions Precautions: Fall Precaution Comments: attempts to sit or lean over RW without warning  Restrictions Weight Bearing Restrictions: No   Pertinent Vitals/Pain BP immediately after walk was 104/72 with pt. Indicating dizziness    Mobility  Bed Mobility Bed Mobility: Supine to Sit;Sitting - Scoot to [place] of Bed;Sit to Supine Supine to Sit: 4: Min assist Sitting - Scoot to [place] of Bed: 5: Supervision Sit to Supine: 1: +2 Total assist Sit to Supine: Patient Percentage: 20% Details for Bed Mobility Assistance: pt. needing min assist to come to edge of bed, but by end of session she was more fatigued, less engaged and needed 2 assist to supine Transfers Transfers: Sit to Stand;Stand to Sit;Stand Pivot Transfers Sit to Stand: 1: +2 Total assist;From  bed;With upper extremity assist Sit to Stand: Patient Percentage: 60% Stand to Sit: 3: Mod assist;With upper extremity assist;With armrests;To chair/3-in-1;1: +2 Total assist;To bed Stand to Sit: Patient Percentage: 20% Stand Pivot Transfers: 1: +2 Total assist Stand Pivot Transfers: Patient Percentage: 20% Details for Transfer Assistance: Pt. did much better with transitions in beginning of session but by end of session, needed 2 assist with pt. at 20% to return to bed. Unsafe in return to bed, requiring cueing to improve safety of trnasfer Ambulation/Gait Ambulation/Gait Assistance: 1: +2 Total assist Ambulation/Gait: Patient Percentage: 70% Ambulation Distance (Feet): 20 Feet Assistive device: Rolling walker Ambulation/Gait Assistance Details: Second person needed for safety and to bring recliner as pt. attempted to sit unexpectedly.  Required increased effort to step with either leg.  fatigued quickly. Gait Pattern: Step-to pattern;Decreased stride length;Decreased hip/knee flexion - right;Decreased hip/knee flexion - left;Decreased dorsiflexion - right;Decreased dorsiflexion - left;Decreased weight shift to right;Decreased weight shift to left Gait velocity: greatly decreased General Gait Details: Pt. appeared excited to ambulate first 20 feet then became dizzy and weak and was assisted to recliner.  She has decreased safety in technique and in awareness.  Pt assisted to sitting then transferred to bed where she went sidelying and in "fetal position" of flexion.  She appeared disengaged at this point though would quietly try to answer questions.  BP once safely seated in recliner was 104/72.    Exercises     PT Diagnosis:    PT Problem List:   PT Treatment Interventions:     PT Goals (current goals can now be found in the  care plan section) Acute Rehab PT Goals Patient Stated Goal: none stated  Visit Information  Last PT Received On: 11/25/12 Assistance Needed: +2 History of  Present Illness: Barbara Clark is a 37 y.o. female presenting with generalized pruritus, stuttering and inability to walk . PMH is significant for type 1 diabetes    Subjective Data  Subjective: Pt. verbalizing alot initially in session with much stuttering .  By end of  session, much less verbal and move "withdrawn"  Indicated she wants to walk Patient Stated Goal: none stated   Cognition  Cognition Arousal/Alertness: Awake/alert Behavior During Therapy: WFL for tasks assessed/performed Overall Cognitive Status: Difficult to assess Area of Impairment: Following commands;Safety/judgement;Awareness;Problem solving Memory: Decreased short-term memory Following Commands: Follows one step commands inconsistently;Follows multi-step commands inconsistently Safety/Judgement: Decreased awareness of safety;Decreased awareness of deficits Awareness: Anticipatory Problem Solving: Slow processing;Decreased initiation;Difficulty sequencing;Requires verbal cues;Requires tactile cues General Comments: Indicates she needs directions repeated for her to follow accurately at times.      Balance     End of Session PT - End of Session Equipment Utilized During Treatment: Gait belt Activity Tolerance: Patient limited by fatigue Patient left: in bed;with call bell/phone within reach;with bed alarm set;with family/visitor present Nurse Communication: Mobility status   GP     Ferman Hamming 11/25/2012, 11:39 AM Weldon Picking PT Acute Rehab Services [phone] Beeper [phone]

## 2012-11-25 NOTE — Progress Notes (Signed)
After multiple calls by several CSWs, we have been unable to reach [facility] [place], who has accepted pt for admission.  Suspect  phone issues on their end.  Weekend CSW to follow.

## 2012-11-25 NOTE — Progress Notes (Signed)
FMTS Attending Note Patient case discussed with resident team, I have reviewed Dr Dennison [name] note and agree with his assessment and plan.  Patient who is deemed to require 24 hour assistance due to her inability to ambulate secondary to what appears to be conversion disorder.  Family unable to assume 24-hour supervision of the patient upon discharge.  Unable to discuss patient's diagnosis and details of her care with family without patient's consent to protect her privacy.  Appreciate CSW help in locating SNF for 24-hour supervision.  Patient will need outpatient psychiatry follow up for her depression, PTSD and anxiety disorder, and what I suspect is conversion d/o.  Would ask PT to reassess patient for progress. Paula Compton, MD

## 2012-11-25 NOTE — Clinical Social Work Placement (Addendum)
    Clinical Social Work Department CLINICAL SOCIAL WORK PLACEMENT NOTE 11/25/2012  Patient:  Barbara Clark, Barbara Clark  Account Number:  [id number] Admit date:  11/20/2012  Clinical Social Worker:  Gretta Cool, Chiropodist CSW  Date/time:  11/25/2012 03:00 PM  Clinical Social Work is seeking post-discharge placement for this patient at the following level of care:   SKILLED NURSING   (*CSW will update this form in Epic as items are completed)   11/25/2012  Patient/family provided with [facility] Department of Clinical Social Work's list of facilities offering this level of care within the geographic area requested by the patient (or if unable, by the patient's family).  11/25/2012  Patient/family informed of their freedom to choose among providers that offer the needed level of care, that participate in Medicare, Medicaid or managed care program needed by the patient, have an available bed and are willing to accept the patient.  11/25/2012  Patient/family informed of [facility]' ownership interest in [facility], as well as of the fact that they are under no obligation to receive care at this facility.  PASARR submitted to EDS on  PASARR number received from EDS on   FL2 transmitted to all facilities in geographic area requested by pt/family on  11/25/2012 FL2 transmitted to all facilities within larger geographic area on 11/25/2012  Patient informed that his/her managed care company has contracts with or will negotiate with  certain facilities, including the following:     Patient/family informed of bed offers received:   Patient chooses bed at  Physician recommends and patient chooses bed at    Patient to be transferred to [facility] [place], [place] on 11/26/12- Jetta Lout, LCSWA   Patient to be transferred to facility by [facility] Members in person vehicle- Jetta Lout, [facility]   The following physician request were entered in Epic:   Additional  Comments:

## 2012-11-26 LAB — GLUCOSE, CAPILLARY
Glucose-Capillary: 331 mg/dL — ABNORMAL HIGH (ref 70–99)
Glucose-Capillary: 90 mg/dL (ref 70–99)
Glucose-Capillary: 271 mg/dL — ABNORMAL HIGH (ref 70–99)

## 2012-11-26 MED ORDER — NICOTINE 14 MG/24HR TD PT24
14.0000 mg | MEDICATED_PATCH | Freq: Every day | TRANSDERMAL | Status: DC
  Administered 2012-11-26: 14 mg via TRANSDERMAL
  Filled 2012-11-26: qty 1

## 2012-11-26 MED ORDER — NICOTINE 14 MG/24HR TD PT24: 14.0000 mg | MEDICATED_PATCH | Freq: Every day | TRANSDERMAL | Status: DC

## 2012-11-26 MED ORDER — IBUPROFEN 800 MG PO TABS: 800.0000 mg | ORAL_TABLET | Freq: Four times a day (QID) | ORAL | Status: DC | PRN

## 2012-11-26 NOTE — Progress Notes (Signed)
FMTS Attending Note Patient seen and examined by me this morning.  She has slightly improved speech fluency from 2 days ago.  Moving arms and legs freely and fluidly.  Speech is nasal in quality, sometimes stuttering but then picks up in fluency as she continues to speak.  She reiterates her wish that medical team not disclose any of her health information to anyone but her boyfriend and her 35-yr old daughter.  She has two younger children ages 31 and 48 who are staying with her 5 yr old daughter and are safe.  I got her up to walk with walker (with additional help from her RN); she moves very slowly, then began with tremulousness of arms while gripping walker.  While trying to get back into bed, she pushes herself up in the bed with her arms without difficulty.  A/P: Patient with gait instability who has been recommended for 24-hr assistance by PT.  She does not have family members who are able to provide this level of support for her.  Psychiatry has consulted on patient and she does not meet inpatient psych criteria.  Will continue to monitor and reassess her functional status regularly while we await SNF placement.  From a medical standpoint, she is ready for discharge when necessary assistive environment is arranged. Paula Compton, MD

## 2012-11-26 NOTE — Progress Notes (Signed)
Family Medicine Teaching Service Daily Progress Note Intern Pager: [phone]  Patient name: Barbara Clark Medical record number: [id number] Date of birth: 07/23/1975 Age: 37 y.o. Gender: female  Primary Care Provider: Janit Pagan, MD Consultants: Psychiatry, Neurology Code Status: Full code  Pt Overview and Major Events to Date:  11/3 - MRI normal; Lumbar spine and sacral/coccyx X-rays normal 11/5 - Psychiatry saw patient and diagnosed PTSD and depression 11/7 - Attempting to look for SNF placement  Assessment and Plan: Barbara Clark is a 37 y.o. female presenting with generalized pruritus, stuttering and inability to walk with new onset left-sided hemi-neglect and stable currently not improving and stable.  # Stuttering and acute inability to walk: appears to be from acute stress reaction vs conversion disorder. There is no evidence of focal weakness on exam when patient is laying down. CT head normal. MRI was normal. Has new neurological complaints of decreased hearing. Psychiatry started buspirone for anxiety. Appreciated psychiatry recommendations. Neuro recommends continued PT/OT. - follow up on progress of finding someone to provide 24 hour supervision - follow-up PT recommendations  # Type1 Diabetes: CBG's on admission in the 270's with >1000 glucose in urine. Fasting glucose 331 on 11/26/2012. She received 8 units of aspart for blood correction in the last 24 hours. CBGs have been in the 100-300 range. Hemoglobin A1C is 8.9. - continue home lantus 30 units  - continue sliding scale insulin - continue insulin 8 units for meal coverage  # Depression - continue lexapro  # Pruritus: potentially from allergic reaction. Unclear etiology although patient thinking it could from hazelnut in coffee - benadryl as needed  # Tobacco dependence - start nicotine patch 14mg  daily  FEN/GI: carb modified diet  Prophylaxis: heparin   #Disposition: discharge pending SNF  placement   Subjective: Barbara Clark is doing better today. She says that she gets frustrated when her family come and at those times her symptoms worsen. She is eager to get healthy and feels like she is making good progress. She is still open to SNF placement. She reports some headaches which are helped by ibuprofen.  Objective: Temp:  [98.1 F (36.7 C)-98.5 F (36.9 C)] 98.4 F (36.9 C) (11/08 0620) Pulse Rate:  [79-86] 82 (11/08 0620) Resp:  [18] 18 (11/08 0620) BP: (100-107)/(61-74) 107/72 mmHg (11/08 0620) SpO2:  [98 %-100 %] 100 % (11/08 6962)  Physical Exam: General: Laying comfortably in bed in no acute distress, stuttering has improved overall Cardiovascular: RRR, no murmurs Respiratory: clear to auscultation bilaterally Abdomen: soft, non-tender, non-distended Neuro:  Alert, oriented, able to follow commands with increased explanation, UE strength 3/5 and LE strength 3/5. Dorsi/plantarflexion 3/5 bilaterally  Laboratory:   No recent labs  Imaging/Diagnostic Tests:  No recent imaging  Barbara Hawking, MD 11/26/2012, 9:36 AM PGY-1, [facility] Family Medicine FPTS Intern pager: [phone], text pages welcome

## 2012-11-26 NOTE — Progress Notes (Signed)
Patient discharged to [facility] facility of [place], was accompanied and transported by family members and report was given to [facility] LPN.

## 2012-11-26 NOTE — Progress Notes (Signed)
Clinical Child psychotherapist (CSW) contacted [facility] facility in [place], [place] to see if they could accept the patient. Rosalin Hawking at the facility confirmed that patient has a bed read today at the facility. Tonya RN at facility confirmed receipt of the D/C summary and AVS that CSW faxed over. Per RN family is transporting patient to facility in [place] and will be at the hospital around 5:30 or 6 pm. CSW printed Map quest directions from [facility] to the facility in [place] for the family. Nursing is aware of above. Please resoncult if further social work needs arise. CSW signing off.   Jetta Lout, LCSWA Weekend CSW [phone]

## 2012-12-12 ENCOUNTER — Ambulatory Visit (INDEPENDENT_AMBULATORY_CARE_PROVIDER_SITE_OTHER): Payer: Medicaid Other | Admitting: Family Medicine

## 2012-12-12 ENCOUNTER — Encounter: Payer: Self-pay | Admitting: Family Medicine

## 2012-12-12 VITALS — BP 112/71 | HR 78 | Temp 98.7°F

## 2012-12-12 DIAGNOSIS — F339 Major depressive disorder, recurrent, unspecified: Secondary | ICD-10-CM

## 2012-12-12 DIAGNOSIS — J322 Chronic ethmoidal sinusitis: Secondary | ICD-10-CM

## 2012-12-12 DIAGNOSIS — E109 Type 1 diabetes mellitus without complications: Secondary | ICD-10-CM

## 2012-12-12 MED ORDER — AMOXICILLIN-POT CLAVULANATE 875-125 MG PO TABS: 1.0000 | ORAL_TABLET | Freq: Two times a day (BID) | ORAL | Status: DC

## 2012-12-12 MED ORDER — ESCITALOPRAM OXALATE 10 MG PO TABS: 10.0000 mg | ORAL_TABLET | Freq: Every day | ORAL | Status: DC

## 2012-12-12 MED ORDER — BLOOD GLUCOSE METER KIT: PACK | Status: DC

## 2012-12-12 NOTE — Progress Notes (Addendum)
Family Medicine Office Visit Note   Subjective:   Patient ID: Barbara Clark, female  DOB: 12/18/1975, 37 y.o.. MRN: [id number]   Pt that comes today for follow up. She was admitted on 11/2 for depression, anxiety, possible conversion disorder and DM type 1. She was discharge to Nursing Home in which she stayed till Saturday. She comes accompanied by a friend that she is staying with. Her issues to f/u were: 1. Psychiatry follow up: Pt has not made appointment yet for Psychiatry nor Neurology as instructed in her discharge summary.  2. Started on SSRI: pt reports she had no medication since she left SNIF but was taking it when she was there. Reports no suicidal ideation or plans but still sad and anhedonia is present.   3. Etmoidal sinusitis: minimal to mild found on MRI. Pt reports "frontal migraines" worse since she was discharged form hospital. She denies photophobia, nausea, vomiting, fever or chills, no other systemic symptoms. HA responds to analgesics but "comes back". No nasal drainage or olfactory changes noted.   4. Blood sugar control: unsure of how her bloods sugars has been since she has no metter and she was in a sliding scale in the nursing home on top of her regular insulin doses. Uses Lantus 30 units QHS and Novolog 8 units TID with meals but reports has been on 14u. Denies hypoglycemic events or hyperglycemic symptoms.   Review of Systems:  Pt denies SOB, chest pain, palpitations ordizziness. No changes on urinary or BM habits.  Objective:   Physical Exam: Gen:  NAD, sitting in a wheel chair  HEENT: Moist mucous membranes. PEERL. Neck : supple. Tenderness to palpation of frontal sinuses. No nasal discharge.  CV: Regular rate and rhythm, no murmurs rubs or gallops PULM: Clear to auscultation bilaterally. No wheezes/rales/rhonchi ABD: Soft, non tender, non distended, normal bowel sounds EXT: No edema Neuro: Alert and oriented x3. Upper and LE are 4/5 strength. Sensation is  intact, gait is assisted with cane. CN II-XII are intact.  Psych: no agitation, sad mood and affect. No hallucinations.  No suicidal thought or plans.   Assessment & Plan:

## 2012-12-12 NOTE — Patient Instructions (Signed)
Sinusitis Sinusitis is redness, soreness, and swelling (inflammation) of the paranasal sinuses. Paranasal sinuses are air pockets within the bones of your face (beneath the eyes, the middle of the forehead, or above the eyes). In healthy paranasal sinuses, mucus is able to drain out, and air is able to circulate through them by way of your nose. However, when your paranasal sinuses are inflamed, mucus and air can become trapped. This can allow bacteria and other germs to grow and cause infection. Sinusitis can develop quickly and last only a short time (acute) or continue over a long period (chronic). Sinusitis that lasts for more than 12 weeks is considered chronic.  CAUSES  Causes of sinusitis include:  Allergies.  Structural abnormalities, such as displacement of the cartilage that separates your nostrils (deviated septum), which can decrease the air flow through your nose and sinuses and affect sinus drainage.  Functional abnormalities, such as when the small hairs (cilia) that line your sinuses and help remove mucus do not work properly or are not present. SYMPTOMS  Symptoms of acute and chronic sinusitis are the same. The primary symptoms are pain and pressure around the affected sinuses. Other symptoms include:  Upper toothache.  Earache.  Headache.  Bad breath.  Decreased sense of smell and taste.  A cough, which worsens when you are lying flat.  Fatigue.  Fever.  Thick drainage from your nose, which often is green and may contain pus (purulent).  Swelling and warmth over the affected sinuses. DIAGNOSIS  Your caregiver will perform a physical exam. During the exam, your caregiver may:  Look in your nose for signs of abnormal growths in your nostrils (nasal polyps).  Tap over the affected sinus to check for signs of infection.  View the inside of your sinuses (endoscopy) with a special imaging device with a light attached (endoscope), which is inserted into your  sinuses. If your caregiver suspects that you have chronic sinusitis, one or more of the following tests may be recommended:  Allergy tests.  Nasal culture A sample of mucus is taken from your nose and sent to a lab and screened for bacteria.  Nasal cytology A sample of mucus is taken from your nose and examined by your caregiver to determine if your sinusitis is related to an allergy. TREATMENT  Most cases of acute sinusitis are related to a viral infection and will resolve on their own within 10 days. Sometimes medicines are prescribed to help relieve symptoms (pain medicine, decongestants, nasal steroid sprays, or saline sprays).  However, for sinusitis related to a bacterial infection, your caregiver will prescribe antibiotic medicines. These are medicines that will help kill the bacteria causing the infection.  Rarely, sinusitis is caused by a fungal infection. In theses cases, your caregiver will prescribe antifungal medicine. For some cases of chronic sinusitis, surgery is needed. Generally, these are cases in which sinusitis recurs more than 3 times per year, despite other treatments. HOME CARE INSTRUCTIONS   Drink plenty of water. Water helps thin the mucus so your sinuses can drain more easily.  Use a humidifier.  Inhale steam 3 to 4 times a day (for example, sit in the bathroom with the shower running).  Apply a warm, moist washcloth to your face 3 to 4 times a day, or as directed by your caregiver.  Use saline nasal sprays to help moisten and clean your sinuses.  Take over-the-counter or prescription medicines for pain, discomfort, or fever only as directed by your caregiver. SEEK IMMEDIATE MEDICAL   CARE IF:  You have increasing pain or severe headaches.  You have nausea, vomiting, or drowsiness.  You have swelling around your face.  You have vision problems.  You have a stiff neck.  You have difficulty breathing. MAKE SURE YOU:   Understand these  instructions.  Will watch your condition.  Will get help right away if you are not doing well or get worse.  I have prescribe you an antibiotic medication. Please take as indicated. Followup with your doctor in 10 days.  I have also refilled your medication for depression, and prescription has been given for glucometer. You were discharged from the hospital on insulin Lantus 30 units daily plus insulin as part 8 units 3 times a day with meals. Continue his regimen and if the sugars are elevated contact your primary doctor in order to adjust the dose.

## 2012-12-13 DIAGNOSIS — J322 Chronic ethmoidal sinusitis: Secondary | ICD-10-CM | POA: Insufficient documentation

## 2012-12-13 NOTE — Assessment & Plan Note (Addendum)
Lexapro prescription given. F/u with primary doctor in 2 weeks. Pt needs to make appointments with Psychiatry and Neurology as recommended. This has been discussed and she is agreeable to schedule them.

## 2012-12-13 NOTE — Assessment & Plan Note (Signed)
On Lantus 30u and Novolog 8u TID. No meter. New prescription for meter handed to pt. Instructed to continue discharge instructions regarding insulin dose and check CBG's if more than 180 call to primary doctor for adjustments on her treatment.

## 2012-12-13 NOTE — Assessment & Plan Note (Addendum)
Per MRI , now pt symptomatic with HA. Neuro exam slightly improved but no new signs that are concerning.  Will start Augmentin. Discussed signs of worsening condition that should prompt re-evaluation. F/u with PCP in 7-10 days or sooner if needed.

## 2012-12-22 ENCOUNTER — Telehealth: Payer: Self-pay

## 2012-12-22 NOTE — Telephone Encounter (Signed)
If possible please schedule her with someone else in blue team,but if patient insist then you can have her see me.

## 2012-12-22 NOTE — Telephone Encounter (Signed)
Patient calls needing an appt with Dr. Lum Babe. Dr. Lum Babe is completely booked for the month of December. Patient states she REALLY needs to see MD. She states she has no guidance as to what is her next step after her hospitalization. Please call patient and advise her on what is he next step (returning to work, other issues, etc. ) .

## 2012-12-22 NOTE — Telephone Encounter (Signed)
Please advise.  Can pt see someone else on blue or do you want to double book?  Shanay Woolman,CMA

## 2012-12-22 NOTE — Telephone Encounter (Signed)
Yes pt is still taking medication as directed.  Zeddie Njie,CMA

## 2012-12-22 NOTE — Telephone Encounter (Signed)
Barbara Clark from [facility] called and states that she did a home visit with pt.  Her PHQ9 was 20.  It is their policy to call the office if they score over 5.  She also was supposed to follow up with neurology after leaving hospital and has yet to make an appt.  Barbara Clark states that she will make the appt for pt and then let her know. Jazmin Hartsell,CMA

## 2012-12-22 NOTE — Telephone Encounter (Signed)
She was started on Lexapro during last visit, please ensure patient is taking this medicine, this can be adjusted by Dr Gayla Doss during her appointment with him.

## 2012-12-22 NOTE — Telephone Encounter (Signed)
Pt is going to see Dr. Gayla Doss 12/26/12 at 10am.  She would like you to make sure that he knows her "case".  States that the last doctor she saw didn't seem to know about her.  Jazmin Hartsell,CMA

## 2012-12-24 ENCOUNTER — Other Ambulatory Visit: Payer: Self-pay | Admitting: Family Medicine

## 2012-12-26 ENCOUNTER — Encounter: Payer: Self-pay | Admitting: Clinical

## 2012-12-26 ENCOUNTER — Encounter: Payer: Self-pay | Admitting: Family Medicine

## 2012-12-26 ENCOUNTER — Ambulatory Visit (INDEPENDENT_AMBULATORY_CARE_PROVIDER_SITE_OTHER): Payer: Medicaid Other | Admitting: Family Medicine

## 2012-12-26 VITALS — BP 108/69 | HR 90 | Temp 97.8°F | Ht 67.0 in | Wt 174.0 lb

## 2012-12-26 DIAGNOSIS — J322 Chronic ethmoidal sinusitis: Secondary | ICD-10-CM

## 2012-12-26 DIAGNOSIS — F339 Major depressive disorder, recurrent, unspecified: Secondary | ICD-10-CM

## 2012-12-26 DIAGNOSIS — R269 Unspecified abnormalities of gait and mobility: Secondary | ICD-10-CM

## 2012-12-26 DIAGNOSIS — E109 Type 1 diabetes mellitus without complications: Secondary | ICD-10-CM

## 2012-12-26 HISTORY — DX: Unspecified abnormalities of gait and mobility: R26.9

## 2012-12-26 MED ORDER — FLUOXETINE HCL 20 MG PO TABS: 20.0000 mg | ORAL_TABLET | Freq: Every day | ORAL | Status: DC

## 2012-12-26 MED ORDER — ACCU-CHEK FASTCLIX LANCETS MISC: 1.0000 | Freq: Four times a day (QID) | Status: DC | PRN

## 2012-12-26 MED ORDER — GLUCOSE BLOOD VI STRP: ORAL_STRIP | Status: DC

## 2012-12-26 NOTE — Assessment & Plan Note (Addendum)
A) Remains symptomatic w/o SI - Self d/c'd Lexapro after 3 days due to worsening mood and "crying spells" - Reports that Paxil "made her go crazy when she was a kid" Plan - Met with social worker today; gave contact info for [employer] - Prozac started

## 2012-12-26 NOTE — Progress Notes (Signed)
  Patient name: Barbara Clark MRN [id number]  Date of birth: 1975/06/10  CC & HPI:  Barbara Clark is a 37 y.o. female presenting today for hospital follow-up. She was treated in the hospital from 11/2 -11/7 for shuttering and difficulty walking. She was evaluate by neurology and psych and recommended to follow-up with them after discharge. However, she reports that she was never given any information to contact neurology or psychiatry, and doesn't know who to contact since she has been discharge from SNF. She reports falling several times since returning home, but denies hitting her head. She has family members that have been help take care of her, and a Careers information officer who helped her make a neurology apt later this month. She only took her Lexapro for a few days, because it made her more "depressed and caused crying spells." She denies any current SI. Additionally, she continues to endorse HAs and reports that she is still taking the Augmentin for sinusitis discovered on MRI.  Medical & Surgical Hx:  Reviewed.  Medications & Allergies: Reviewed & Updated - see associated section Social History: Reviewed:   Objective Findings:  Vitals: BP 108/69  Pulse 90  Temp(Src) 97.8 F (36.6 C) (Oral)  Ht 5\' 7"  (1.702 m)  Wt 174 lb (78.926 kg)  BMI 27.25 kg/m2  Gen: NAD; Slow gait; Tearful Head: Normocephalic/Atraumatic; Scalp w/o lesions; supra-orbital tenderness Eyes:Sclera white; Conjunctiva pink; PERRLA; EOMI;  Resp: CTAB w/ normal respiratory effort   Assessment & Plan:   Please See Problem Focused Assessment & Plan

## 2012-12-26 NOTE — Assessment & Plan Note (Signed)
No signs/symptoms of worsening pain or change in neurological symptoms - Continue Augmentin - Reevaluate once Abx completed

## 2012-12-26 NOTE — Assessment & Plan Note (Signed)
Has been taking Lantus 30u + Novolog 8u TID w/ meals - Unable to check BGs due to no strips  - Sent in Rx for Strips & Lancets

## 2012-12-26 NOTE — Patient Instructions (Addendum)
It was great seeing you today.   1. Please call and make apt with Psychiatry Vesta Mixer) as soon as possible 2. Start taking Prozac once daily  Taking the medicine as directed and not missing any doses is one of the best things you can do to treat your anxiety/depression.  Here are some things to keep in mind:  Side effects (stomach upset, some increased anxiety) may happen before you notice a benefit.  These side effects typically go away over time.  Changes to your dose of medicine or a change in medication all together is sometimes necessary  Many people will notice an improvement within two weeks but the full effect of the medication can take up to 4-6 weeks  Stopping the medication when you start feeling better often results in a return of symptoms. Most people need to be on medication at least 6-12 months  If you start having thoughts of hurting yourself or others after starting this medicine, please call me immediately.     3. Schedule apt with your primary care Doctor: Lum Babe in 1 month 4. Please keep your neurology apt on 12/29   Please bring all your medications to every doctors visit  Sign up for My Chart to have easy access to your labs results, and communication with your Primary care physician.   Please check-out at the front desk before leaving the clinic.   I look forward to talking with you again at our next visit. If you have any questions or concerns before then, please call the clinic at [phone].  Take Care,   Dr Wenda Low

## 2012-12-26 NOTE — Progress Notes (Signed)
Clinical Child psychotherapist (CSW) met with pt who presented tearful. Pt expressed frustration with not having a clear diagnosis as to why she cannot walk. Pt stated she has had multiple falls at home, is still out of work and has been feeling extremely tearful. CSW provided active listening and validated pt feelings. CSW explored whether pt could have someone stay with her at home. Pt stated she can have a family member stay with her however she needs a letter written from her physician in order for the housing authority to allow someone to stay with her.CSW informed pt that CSW could place a referral to [facility]) to see if patient can receive assistance at home, pt appreciative and agreeable. CSW also provided pt with a list of counseling/psychiatry resources and encouraged pt to call to receive counseling/assisatnce with medications. Pt agreeable.  Dr. Gayla Doss provided pt with a letter for her to take to her employer and housing authority. [facility] form placed in Dr. Scarlette Shorts box for signature.  Theresia Bough, MSW, LCSW [phone]

## 2012-12-26 NOTE — Assessment & Plan Note (Signed)
A) MRI neg at hospitalization 11/2; Neurology & Psych consulted in Hospital, and recommend outpt f/u  Plan - Neurology apt 12/29 - Referred to [facility] for continued Psych evaluation - Given Work-excuse while undergoing evaluation

## 2013-01-14 ENCOUNTER — Telehealth: Payer: Self-pay | Admitting: Family Medicine

## 2013-01-14 ENCOUNTER — Encounter (HOSPITAL_COMMUNITY): Payer: Self-pay | Admitting: Emergency Medicine

## 2013-01-14 DIAGNOSIS — Z3202 Encounter for pregnancy test, result negative: Secondary | ICD-10-CM | POA: Insufficient documentation

## 2013-01-14 DIAGNOSIS — Z8619 Personal history of other infectious and parasitic diseases: Secondary | ICD-10-CM | POA: Insufficient documentation

## 2013-01-14 DIAGNOSIS — Z79899 Other long term (current) drug therapy: Secondary | ICD-10-CM | POA: Insufficient documentation

## 2013-01-14 DIAGNOSIS — F17200 Nicotine dependence, unspecified, uncomplicated: Secondary | ICD-10-CM | POA: Insufficient documentation

## 2013-01-14 DIAGNOSIS — F3289 Other specified depressive episodes: Secondary | ICD-10-CM | POA: Insufficient documentation

## 2013-01-14 DIAGNOSIS — I959 Hypotension, unspecified: Secondary | ICD-10-CM | POA: Insufficient documentation

## 2013-01-14 DIAGNOSIS — Z794 Long term (current) use of insulin: Secondary | ICD-10-CM | POA: Insufficient documentation

## 2013-01-14 DIAGNOSIS — B373 Candidiasis of vulva and vagina: Secondary | ICD-10-CM | POA: Insufficient documentation

## 2013-01-14 DIAGNOSIS — N390 Urinary tract infection, site not specified: Secondary | ICD-10-CM | POA: Insufficient documentation

## 2013-01-14 DIAGNOSIS — F411 Generalized anxiety disorder: Secondary | ICD-10-CM | POA: Insufficient documentation

## 2013-01-14 DIAGNOSIS — R631 Polydipsia: Secondary | ICD-10-CM | POA: Insufficient documentation

## 2013-01-14 DIAGNOSIS — E1029 Type 1 diabetes mellitus with other diabetic kidney complication: Secondary | ICD-10-CM | POA: Insufficient documentation

## 2013-01-14 DIAGNOSIS — F329 Major depressive disorder, single episode, unspecified: Secondary | ICD-10-CM | POA: Insufficient documentation

## 2013-01-14 DIAGNOSIS — B3731 Acute candidiasis of vulva and vagina: Secondary | ICD-10-CM | POA: Insufficient documentation

## 2013-01-14 LAB — PREGNANCY, URINE: Preg Test, Ur: NEGATIVE

## 2013-01-14 LAB — URINALYSIS, ROUTINE W REFLEX MICROSCOPIC
Bilirubin Urine: NEGATIVE
Glucose, UA: >1000 mg/dL — AB
Hgb urine dipstick: NEGATIVE
Ketones, ur: NEGATIVE mg/dL
Leukocytes, UA: NEGATIVE
Nitrite: POSITIVE — AB
Protein, ur: NEGATIVE mg/dL
Specific Gravity, Urine: 1.043 — ABNORMAL HIGH (ref 1.005–1.030)
Urobilinogen, UA: 0.2 mg/dL (ref 0.0–1.0)
pH: 6 (ref 5.0–8.0)

## 2013-01-14 LAB — CBC WITH DIFFERENTIAL/PLATELET
Basophils Absolute: 0 10*3/uL (ref 0.0–0.1)
Basophils Relative: 0 % (ref 0–1)
Eosinophils Absolute: 0.3 10*3/uL (ref 0.0–0.7)
Eosinophils Relative: 3 % (ref 0–5)
HCT: 36.5 % (ref 36.0–46.0)
Hemoglobin: 12.3 g/dL (ref 12.0–15.0)
Lymphocytes Relative: 41 % (ref 12–46)
Lymphs Abs: 3.7 10*3/uL (ref 0.7–4.0)
MCH: 30.8 pg (ref 26.0–34.0)
MCHC: 33.7 g/dL (ref 30.0–36.0)
MCV: 91.5 fL (ref 78.0–100.0)
Monocytes Absolute: 0.5 10*3/uL (ref 0.1–1.0)
Monocytes Relative: 6 % (ref 3–12)
Neutro Abs: 4.5 10*3/uL (ref 1.7–7.7)
Neutrophils Relative %: 50 % (ref 43–77)
Platelets: 400 10*3/uL (ref 150–400)
RBC: 3.99 MIL/uL (ref 3.87–5.11)
RDW: 12.5 % (ref 11.5–15.5)
WBC: 9 10*3/uL (ref 4.0–10.5)

## 2013-01-14 LAB — COMPREHENSIVE METABOLIC PANEL
ALT: 9 U/L (ref 0–35)
AST: 11 U/L (ref 0–37)
Albumin: 3.8 g/dL (ref 3.5–5.2)
Alkaline Phosphatase: 117 U/L (ref 39–117)
BUN: 13 mg/dL (ref 6–23)
CO2: 24 mEq/L (ref 19–32)
Calcium: 8.9 mg/dL (ref 8.4–10.5)
Chloride: 96 mEq/L (ref 96–112)
Creatinine, Ser: 0.47 mg/dL — ABNORMAL LOW (ref 0.50–1.10)
GFR calc Af Amer: >90 mL/min (ref >90)
GFR calc non Af Amer: >90 mL/min (ref >90)
Glucose, Bld: 348 mg/dL — ABNORMAL HIGH (ref 70–99)
Potassium: 4.1 mEq/L (ref 3.5–5.1)
Sodium: 132 mEq/L — ABNORMAL LOW (ref 135–145)
Total Bilirubin: 0.1 mg/dL — ABNORMAL LOW (ref 0.3–1.2)
Total Protein: 7.9 g/dL (ref 6.0–8.3)

## 2013-01-14 LAB — URINE MICROSCOPIC-ADD ON

## 2013-01-14 NOTE — Telephone Encounter (Signed)
Pt calling to let me know that her sugar is 367, it was > 500 cbg 1 hour after last call. At that time, she took 10 units of novolog and just took 8 additional units 15 minutes ago. She has a headache, but no vomiting. She notes a yeast-like discharge, but no other sources of infection.   24 hours insulin requirement:   40 units of Lantus (30 last night + additional 10 today) 66 units of Novolog (prescribed 24 units total)  Given persistently elevated CBG despite taking approximately 2x regular daily dose of insulin, I instructed pt to come to ED for further eval to r/o acidosis and make sure no other cause aside from antipsychotics. Pt verbalized understanding.

## 2013-01-14 NOTE — Telephone Encounter (Signed)
Pt calling because very concerning about blood sugars. They have been > 400 for past 3 days. She thinks that this is due to starting Saphris, which was started a [facility] by a pyschiatrist. The patient took her first dose on Tuesday and 2 doses on Wednesday, but has not taken any since. Last night, she took Lantus 30 units and today she took 14 units of novolog with breakfast and lunch. Her last CBG was 407 at 1PM.   I instructed her to check her CBG while I was on the phone, and it was 391. I instructed her to take 10 units of Lantus and 5 units of Novolog at this time. She did while we were on the phone. She denies nausea and vomiting at this time. I instructed her to drinks lots of fluid and check her CBG in one hour. If it is above 350, then she should take 5 more units of Novolog. Then repeat in 1 hours. She will call me when she check her sugar before dinner or sooner if needed. Pt agreeable with plan.

## 2013-01-14 NOTE — ED Notes (Signed)
The pts blood sugar has been running high since she started a new med on Wednesday.  She is an insulin dependent diabetic.  lmp  Dec 3

## 2013-01-15 ENCOUNTER — Telehealth: Payer: Self-pay | Admitting: Family Medicine

## 2013-01-15 ENCOUNTER — Emergency Department (HOSPITAL_COMMUNITY)
Admission: EM | Admit: 2013-01-15 | Discharge: 2013-01-15 | Disposition: A | Discharge status: Home / Self Care | Payer: Medicaid Other | Attending: Emergency Medicine | Admitting: Emergency Medicine

## 2013-01-15 DIAGNOSIS — R739 Hyperglycemia, unspecified: Secondary | ICD-10-CM

## 2013-01-15 DIAGNOSIS — N390 Urinary tract infection, site not specified: Secondary | ICD-10-CM

## 2013-01-15 LAB — GLUCOSE, CAPILLARY
Glucose-Capillary: 353 mg/dL — ABNORMAL HIGH (ref 70–99)
Glucose-Capillary: 289 mg/dL — ABNORMAL HIGH (ref 70–99)

## 2013-01-15 MED ORDER — DEXTROSE 5 % IV SOLN
1.0000 g | Freq: Once | INTRAVENOUS | Status: AC
  Administered 2013-01-15: 1 g via INTRAVENOUS
  Filled 2013-01-15: qty 10

## 2013-01-15 MED ORDER — FLUCONAZOLE 150 MG PO TABS: 150.0000 mg | ORAL_TABLET | Freq: Once | ORAL | Status: DC

## 2013-01-15 MED ORDER — SODIUM CHLORIDE 0.9 % IV BOLUS (SEPSIS)
1000.0000 mL | Freq: Once | INTRAVENOUS | Status: AC
  Administered 2013-01-15: 1000 mL via INTRAVENOUS

## 2013-01-15 MED ORDER — CEPHALEXIN 500 MG PO CAPS: 500.0000 mg | ORAL_CAPSULE | Freq: Four times a day (QID) | ORAL | Status: DC

## 2013-01-15 NOTE — ED Provider Notes (Addendum)
CSN: [id number]     Arrival date & time 01/14/13  2041 History   First MD Initiated Contact with Patient 01/15/13 0358     Chief Complaint  Patient presents with  . Hyperglycemia   (Consider location/radiation/quality/duration/timing/severity/associated sxs/prior Treatment) HPI Comments: 37 y/o female with DM, on insulin - recently stopped using a medication that was thought to be causing her CBG to rise - she has had > 500 CBG X a couple of days - assocaited thirst and urinary frequentcy but no vomiting, diarrhea, abd pain, cough, sob, cp or any other c/o.  Sx are persistent, was told to take increased insuling tonight before coming in.  Patient is a 37 y.o. female presenting with hyperglycemia. The history is provided by the patient.  Hyperglycemia   Past Medical History  Diagnosis Date  . Diabetes mellitus     Type I  . Depression   . Anxiety   . HSV-2 (herpes simplex virus 2) infection 2012    serology   Past Surgical History  Procedure Laterality Date  . Tubal ligation     Family History  Problem Relation Age of Onset  . Diabetes Mother   . Hypertension Mother   . Diabetes Maternal Grandmother    History  Substance Use Topics  . Smoking status: Current Some Day Smoker -- 1.00 packs/day    Types: Cigarettes  . Smokeless tobacco: Never Used     Comment: trying to quit  . Alcohol Use: Yes     Comment: rare but on occasion   OB History   Grav Para Term Preterm Abortions TAB SAB Ect Mult Living                 Review of Systems  All other systems reviewed and are negative.    Allergies  Review of patient's allergies indicates no known allergies.  Home Medications   Current Outpatient Rx  Name  Route  Sig  Dispense  Refill  . asenapine (SAPHRIS) 5 MG SUBL 24 hr tablet   Sublingual   Place 5 mg under the tongue 2 (two) times daily.         Marland Kitchen ibuprofen (ADVIL,MOTRIN) 200 MG tablet   Oral   Take 400 mg by mouth every 6 (six) hours as needed for  headache.         . insulin aspart (NOVOLOG) 100 UNIT/ML injection   Subcutaneous   Inject 8 Units into the skin 3 (three) times daily with meals.   1 vial   12   . insulin glargine (LANTUS) 100 UNIT/ML injection   Subcutaneous   Inject 30 Units into the skin at bedtime.         . cephALEXin (KEFLEX) 500 MG capsule   Oral   Take 1 capsule (500 mg total) by mouth 4 (four) times daily.   40 capsule   0   . fluconazole (DIFLUCAN) 150 MG tablet   Oral   Take 1 tablet (150 mg total) by mouth once.   1 tablet   0    BP 100/65  Pulse 85  Temp(Src) 99 F (37.2 C)  Resp 18  Ht 5\' 7"  (1.702 m)  Wt 178 lb (80.74 kg)  BMI 27.87 kg/m2  SpO2 100%  LMP 12/21/2012 Physical Exam  Nursing note and vitals reviewed. Constitutional: She appears well-developed and well-nourished. No distress.  HENT:  Head: Normocephalic and atraumatic.  Mouth/Throat: Oropharynx is clear and moist. No oropharyngeal exudate.  Eyes: Conjunctivae and EOM  are normal. Pupils are equal, round, and reactive to light. Right eye exhibits no discharge. Left eye exhibits no discharge. No scleral icterus.  Neck: Normal range of motion. Neck supple. No JVD present. No thyromegaly present.  Cardiovascular: Normal rate, regular rhythm, normal heart sounds and intact distal pulses.  Exam reveals no gallop and no friction rub.   No murmur heard. Pulmonary/Chest: Effort normal and breath sounds normal. No respiratory distress. She has no wheezes. She has no rales.  Abdominal: Soft. Bowel sounds are normal. She exhibits no distension and no mass. There is no tenderness.  Musculoskeletal: Normal range of motion. She exhibits no edema and no tenderness.  Lymphadenopathy:    She has no cervical adenopathy.  Neurological: She is alert. Coordination normal.  Skin: Skin is warm and dry. No rash noted. No erythema.  Psychiatric: She has a normal mood and affect. Her behavior is normal.    ED Course  Procedures (including  critical care time) Labs Review Labs Reviewed  URINALYSIS, ROUTINE W REFLEX MICROSCOPIC - Abnormal; Notable for the following:    APPearance CLOUDY (*)    Specific Gravity, Urine 1.043 (*)    Glucose, UA >1000 (*)    Nitrite POSITIVE (*)    All other components within normal limits  COMPREHENSIVE METABOLIC PANEL - Abnormal; Notable for the following:    Sodium 132 (*)    Glucose, Bld 348 (*)    Creatinine, Ser 0.47 (*)    Total Bilirubin 0.1 (*)    All other components within normal limits  URINE MICROSCOPIC-ADD ON - Abnormal; Notable for the following:    Squamous Epithelial / LPF FEW (*)    Bacteria, UA MANY (*)    All other components within normal limits  GLUCOSE, CAPILLARY - Abnormal; Notable for the following:    Glucose-Capillary 353 (*)    All other components within normal limits  GLUCOSE, CAPILLARY - Abnormal; Notable for the following:    Glucose-Capillary 289 (*)    All other components within normal limits  URINE CULTURE  PREGNANCY, URINE  CBC WITH DIFFERENTIAL   Imaging Review No results found.  EKG Interpretation   None       MDM   1. UTI (lower urinary tract infection)   2. Hyperglycemia    borderling hypotension - UTI on labs - otherwise has high CBG, no kedontes in urine - needs fluids, and abx and reexam - she feels stable and has no pain, no nausea and no fever.  Not tachycardic.  UA confirms UTI - abx given, fluids given, no acidosis, normal VS, stable for d/c.  Explained results to pt and tretment plan, understanding expressed.  Pt refuses pelvic after stating she has been having a yeast infection - requests abx -  Meds given in ED:  Medications  sodium chloride 0.9 % bolus 1,000 mL (0 mLs Intravenous Stopped 01/15/13 0549)  sodium chloride 0.9 % bolus 1,000 mL (0 mLs Intravenous Stopped 01/15/13 0632)  cefTRIAXone (ROCEPHIN) 1 g in dextrose 5 % 50 mL IVPB (0 g Intravenous Stopped 01/15/13 4742)    New Prescriptions   CEPHALEXIN (KEFLEX)  500 MG CAPSULE    Take 1 capsule (500 mg total) by mouth 4 (four) times daily.   FLUCONAZOLE (DIFLUCAN) 150 MG TABLET    Take 1 tablet (150 mg total) by mouth once.      Vida Roller, MD 01/15/13 5956  Vida Roller, MD 01/15/13 [phone]

## 2013-01-15 NOTE — ED Notes (Signed)
Checked Pt CBG 353

## 2013-01-15 NOTE — ED Notes (Signed)
CBG-289. Notified RN

## 2013-01-15 NOTE — ED Notes (Signed)
Pt reports hyperglycemia x1 week, pt states she has discussed this w/ her PCP and was told to increase her lantus by 10 units, however CBG remains elevated, pt denies any other associating symptoms other than "feeling dehydrated."

## 2013-01-15 NOTE — Telephone Encounter (Signed)
Pt calling from the ED to let me know that she has been in the ED since 9:30 PM and is wondering if she can leave. I told her that I cannot give her permission to leave, because I am not a doctor in the ED. I told her to speak to the personnel in the ED.

## 2013-01-16 ENCOUNTER — Encounter: Payer: Self-pay | Admitting: Neurology

## 2013-01-16 ENCOUNTER — Ambulatory Visit (INDEPENDENT_AMBULATORY_CARE_PROVIDER_SITE_OTHER): Payer: Medicaid Other | Admitting: Neurology

## 2013-01-16 VITALS — BP 114/80 | HR 80 | Temp 98.2°F | Ht 67.0 in | Wt 176.0 lb

## 2013-01-16 DIAGNOSIS — F449 Dissociative and conversion disorder, unspecified: Secondary | ICD-10-CM

## 2013-01-16 NOTE — Patient Instructions (Signed)
I think your symptoms are likely related to depression and anxiety.  Your symptoms do not correlate with a neurological condition, which is a good thing.  I think the best therapy would be to see psychologist and psychiatry.

## 2013-01-16 NOTE — Progress Notes (Signed)
NEUROLOGY CONSULTATION NOTE  Barbara Clark MRN: [id number] DOB: October 31, 1975  Referring provider: Jacquelin Hawking, MD Primary care provider: Janit Pagan, MD; Wenda Low, MD  Reason for consult:  Abnormal gait, tremor  HISTORY OF PRESENT ILLNESS: Barbara Clark is a 37 year old right-handed woman with history of type I diabetes mellitus who presents with episode of stuttering and gait instability.  Records and images were personally reviewed where available.    She initially presented to the ED with generalized pruritus, thought to be an allergic reaction but of unclear etiology.  She did not have any signs of anaphylaxis.  She received Benadryl and Solumedrol.  While in the ED, she had syncopal episode.  Afterwards, she exhibited stuttering speech, however she was able to write without difficulty.  She presented with difficulty with speech production and reportedly would stay "nananana" and "fafafa" when she would begin talking.  This was inconsistent and would at times speak clearing.  She also presented with abnormal gait and fall, which semiology suggested a non-organic etiology.  She appeared to exhibit weakness, however neurological testing revealed strength intact.  It was also mentioned that at some point she had "left sided neglect" but symptomatology was not elaborated.  She did undergo a workup.  MRI of brain was normal.  XR of lumbar spine, sacrum and coccyx were normal.  Neurology was consulted and found that her symptoms were an acute stress reaction.  Psychiatry was consulted and she was diagnosed with depression and anxiety.  She was started on Lexapro and Buspirone but later was discontinued and was taking Saphris.  She did have one episode of hypoglycemia with glucose level of 59.  She underwent physical therapy and occupational therapy.  She has since been using a walker.  She has been unable to work.  She still reports having multiple falls, usually because her right leg gives  out.  She still has intermittent stuttering, as well as intermittent tremor primarily of the right upper extremity, exacerbated with stress.  She denies back pain and shooting pain down the legs.  She denies numbness in the legs.  She denies bowel or bladder incontinence and retention.  She notes occasional diffuse cramping pain in legs.  Since discharge, she has had uncontrolled blood sugars (400s and 500s), thought to be due to a UTI and Saphris.   She has no prior history of similar symptoms.  She does report anxiety and depression but denies any new stressors.  She works as a Clinical biochemist and in August, she injured her back because a patient fell on her.  She was on light duty for a while.  PAST MEDICAL HISTORY: Past Medical History  Diagnosis Date  . Diabetes mellitus     Type I  . Depression   . Anxiety   . HSV-2 (herpes simplex virus 2) infection 2012    serology    PAST SURGICAL HISTORY: Past Surgical History  Procedure Laterality Date  . Tubal ligation      MEDICATIONS: Current Outpatient Prescriptions on File Prior to Visit  Medication Sig Dispense Refill  . cephALEXin (KEFLEX) 500 MG capsule Take 1 capsule (500 mg total) by mouth 4 (four) times daily.  40 capsule  0  . fluconazole (DIFLUCAN) 150 MG tablet Take 1 tablet (150 mg total) by mouth once.  1 tablet  0  . ibuprofen (ADVIL,MOTRIN) 200 MG tablet Take 400 mg by mouth every 6 (six) hours as needed for headache.      Marland Kitchen  insulin aspart (NOVOLOG) 100 UNIT/ML injection Inject 8 Units into the skin 3 (three) times daily with meals.  1 vial  12  . insulin glargine (LANTUS) 100 UNIT/ML injection Inject 30 Units into the skin at bedtime.      Marland Kitchen asenapine (SAPHRIS) 5 MG SUBL 24 hr tablet Place 5 mg under the tongue 2 (two) times daily.       No current facility-administered medications on file prior to visit.    ALLERGIES: No Known Allergies  FAMILY HISTORY: Family History  Problem Relation Age of Onset  . Diabetes Mother   .  Hypertension Mother   . Diabetes Maternal Grandmother     SOCIAL HISTORY: History   Social History  . Marital Status: Single    Spouse Name: N/A    Number of Children: N/A  . Years of Education: N/A   Occupational History  . Not on file.   Social History Main Topics  . Smoking status: Current Some Day Smoker -- 1.00 packs/day    Types: Cigarettes  . Smokeless tobacco: Never Used     Comment: trying to quit  . Alcohol Use: Yes     Comment: rare but on occasion  . Drug Use: No  . Sexual Activity: Yes    Birth Control/ Protection: Surgical   Other Topics Concern  . Not on file   Social History Narrative  . No narrative on file    REVIEW OF SYSTEMS: Constitutional: No fevers, chills, or sweats, no generalized fatigue, change in appetite Eyes: No visual changes, double vision, eye pain Ear, nose and throat: No hearing loss, ear pain, nasal congestion, sore throat Cardiovascular: No chest pain, palpitations Respiratory:  No shortness of breath at rest or with exertion, wheezes GastrointestinaI: No nausea, vomiting, diarrhea, abdominal pain, fecal incontinence Genitourinary:  No dysuria, urinary retention or frequency Musculoskeletal:  No neck pain, back pain Integumentary: No rash, pruritus, skin lesions Neurological: as above Psychiatric: No depression, insomnia, anxiety Endocrine: No palpitations, fatigue, diaphoresis, mood swings, change in appetite, change in weight, increased thirst Hematologic/Lymphatic:  No anemia, purpura, petechiae. Allergic/Immunologic: no itchy/runny eyes, nasal congestion, recent allergic reactions, rashes  PHYSICAL EXAM: Filed Vitals:   01/16/13 1230  BP: 114/80  Pulse: 80  Temp: 98.2 F (36.8 C)   General: No acute distress Head:  Normocephalic/atraumatic Neck: supple, no paraspinal tenderness, full range of motion Back: No paraspinal tenderness Heart: regular rate and rhythm Lungs: Clear to auscultation bilaterally. Vascular:  No carotid bruits. Neurological Exam: Mental status: alert and oriented to person, place, and time, speech fluent and not dysarthric, although she will sometimes exhibit some stuttering briefly, language intact. Cranial nerves: CN I: not tested CN II: pupils equal, round and reactive to light, visual fields intact, fundi unremarkable. CN III, IV, VI:  full range of motion, no nystagmus, no ptosis CN V: facial sensation intact CN VII: upper and lower face symmetric CN VIII: hearing intact CN IX, X: gag intact, uvula midline CN XI: sternocleidomastoid and trapezius muscles intact CN XII: tongue midline Bulk & Tone: normal, no fasciculations. Motor: reduced effort but with encouragement, 5/5 throughout.  No rigidity.  Occasionally will start shaking at the right arm and hand, particularly when flustered.  It fluctuates in amplitude and dissipates when distracted. Sensation: pinprick and vibration intact Deep Tendon Reflexes: 2+ throughout, trace in ankles, toes down Finger to nose testing: no dysmetria or tremor. Heel to shin: no dysmetria Gait: wide-based which requires her to hold on to the wall.  She will start tipping over to all sides.  When forced not to hold onto anything, she takes very slow cautious wide steps but ultimately maintains balance.  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  Semiology of symptoms suggest non-organic etiology.  PLAN: Treatment should be focused on underlying psychiatric condition.  I don't believe this is a neurological condition so further neurological testing and follow up is not warranted.  I discussed my findings and diagnosis with the patient and provided reassurance.  45 minutes spent with patient, over 50% spent counseling and coordinating care.  Thank you for allowing me to take part in the care of this patient.  Shon Millet, DO  CC:  Janit Pagan, MD  Wenda Low,  MD

## 2013-01-17 ENCOUNTER — Telehealth (HOSPITAL_COMMUNITY): Payer: Self-pay | Admitting: Emergency Medicine

## 2013-01-17 LAB — URINE CULTURE: Colony Count: >=100000

## 2013-01-17 NOTE — ED Notes (Signed)
Post ED Visit - Positive Culture Follow-up  Culture report reviewed by antimicrobial stewardship pharmacist: []  Wes Dulaney, Pharm.D., BCPS []  Celedonio Miyamoto, [address].D., BCPS []  Georgina Pillion, Pharm.D., BCPS []  [place], [address].D., BCPS, AAHIVP []  Estella Husk, Pharm.D., BCPS, AAHIVP [x]  Harland German, Pharm.D., BCPS  Positive urine culture Treated with Keflex, organism sensitive to the same and no further patient follow-up is required at this time.  Marcelle Overlie, Jenel Lucks 01/17/2013, 11:50 AM

## 2013-02-02 ENCOUNTER — Encounter: Payer: Self-pay | Admitting: Family Medicine

## 2013-02-02 ENCOUNTER — Ambulatory Visit (INDEPENDENT_AMBULATORY_CARE_PROVIDER_SITE_OTHER): Payer: Medicaid Other | Admitting: Family Medicine

## 2013-02-02 ENCOUNTER — Other Ambulatory Visit (HOSPITAL_COMMUNITY)
Admission: RE | Admit: 2013-02-02 | Discharge: 2013-02-02 | Disposition: A | Discharge status: Home / Self Care | Payer: Medicaid Other | Source: Ambulatory Visit | Attending: Family Medicine | Admitting: Family Medicine

## 2013-02-02 VITALS — BP 111/72 | HR 89 | Temp 99.2°F | Ht 67.0 in | Wt 175.0 lb

## 2013-02-02 DIAGNOSIS — R269 Unspecified abnormalities of gait and mobility: Secondary | ICD-10-CM

## 2013-02-02 DIAGNOSIS — F339 Major depressive disorder, recurrent, unspecified: Secondary | ICD-10-CM

## 2013-02-02 DIAGNOSIS — N898 Other specified noninflammatory disorders of vagina: Secondary | ICD-10-CM

## 2013-02-02 DIAGNOSIS — Z113 Encounter for screening for infections with a predominantly sexual mode of transmission: Secondary | ICD-10-CM | POA: Insufficient documentation

## 2013-02-02 DIAGNOSIS — F8081 Childhood onset fluency disorder: Secondary | ICD-10-CM | POA: Insufficient documentation

## 2013-02-02 DIAGNOSIS — E109 Type 1 diabetes mellitus without complications: Secondary | ICD-10-CM

## 2013-02-02 HISTORY — DX: Childhood onset fluency disorder: F80.81

## 2013-02-02 LAB — POCT WET PREP (WET MOUNT)
Clue Cells Wet Prep Whiff POC: NEGATIVE
WBC, Wet Prep HPF POC: >20

## 2013-02-02 NOTE — Progress Notes (Signed)
Subjective:     Patient ID: Barbara Clark, female   DOB: 23-May-1975, 38 y.o.   MRN: [id number]  HPI DM1:Currently on Lantus 30 unit qd and Novolog 14 unit TID,this was an increase from Novolog 8 unit TID,she self increased her dose since her home capillary glucose was running from 200-300,now she falls around 100-200. Denies hypoglycemic episode. Here for follow up. Depression:Patient here for follow up,she was placed on Saphris 5 but this made her blood sugar go up so she stopped it,she has follow up appointment [facility] of Jan,she stated she has been doing well even off the medication,denies suicidal ideation,she worries at times especially due to her weakness and stuttering for which no one could figure out what is going on. Stuttering/Gait problem:This improved a little,she stated it goes on and off.She is not happy she could not find out what is going on. Her B/L limb weakness sill persist,she was seen by neurologist recently and was told nothing is wrong with her.She had PT done for her limb weakness and currently uses a walker,denies any recent fall,she does have pain in her legs about 7/10 in severity for about 2 months now. Vaginal discharge:Since she was discharged from the hospital she has been having vaginal discharge which is whitish in nature,no odor,at times she has itching of her vulva,she denies urinary symptoms. No abnormal vaginal bleed. She was treated last month with Diflucan which made her symptoms worse,she feels she has yeast infection.  Current Outpatient Prescriptions on File Prior to Visit  Medication Sig Dispense Refill  . insulin glargine (LANTUS) 100 UNIT/ML injection Inject 30 Units into the skin at bedtime.      Marland Kitchen asenapine (SAPHRIS) 5 MG SUBL 24 hr tablet Place 5 mg under the tongue 2 (two) times daily.      Marland Kitchen ibuprofen (ADVIL,MOTRIN) 200 MG tablet Take 400 mg by mouth every 6 (six) hours as needed for headache.       No current facility-administered  medications on file prior to visit.   Past Medical History  Diagnosis Date  . Diabetes mellitus     Type I  . Depression   . Anxiety   . HSV-2 (herpes simplex virus 2) infection 2012    serology     Review of Systems  Constitutional: Negative.   Respiratory: Negative.   Cardiovascular: Negative.   All other systems reviewed and are negative.   Filed Vitals:   02/02/13 1346  BP: 111/72  Pulse: 89  Temp: 99.2 F (37.3 C)  TempSrc: Oral  Height: 5\' 7"  (1.702 m)  Weight: 175 lb (79.379 kg)       Objective:   Physical Exam  Nursing note and vitals reviewed. Constitutional: She is oriented to person, place, and time. She appears well-developed. No distress.  Cardiovascular: Normal rate, regular rhythm, normal heart sounds and intact distal pulses.   No murmur heard. Pulmonary/Chest: Effort normal and breath sounds normal. No respiratory distress. She has no wheezes.  Abdominal: Soft. Bowel sounds are normal. She exhibits no distension and no mass. There is no tenderness.  Genitourinary: Cervix exhibits discharge. Cervix exhibits no motion tenderness and no friability.    Musculoskeletal: Normal range of motion. She exhibits no edema.  Neurological: She is alert and oriented to person, place, and time. No cranial nerve deficit.       Assessment/Plan:     DM1 Depression Gait problem Stuttering Vaginal discharge

## 2013-02-02 NOTE — Assessment & Plan Note (Signed)
No Acute change in symptom. Brief counseling done on relaxation. F/U for medication adjustment at [facility] on the 19th. I discuss with her she can also receive counseling with Dr Gwenlyn Saran her,she will discuss this again at her next visit.

## 2013-02-02 NOTE — Assessment & Plan Note (Signed)
Wet prep neg for yeast,BV or trichomonas. GC/CHlamydia pending. This could be normal physiologic discharge. I will f/u with her GC/Chlamydia and call with result. Good hygiene recommended.

## 2013-02-02 NOTE — Assessment & Plan Note (Signed)
May be conversion syndrome. Symptom improved. Continue Psych f/u for now.

## 2013-02-02 NOTE — Patient Instructions (Addendum)
Type 1 Diabetes Mellitus, Adult Type 1 diabetes mellitus, often simply referred to as diabetes, is a long-term (chronic) disease. It occurs when the islet cells in the pancreas that make insulin (a hormone) are destroyed and can no longer make insulin. Insulin is needed to move sugars from food into the tissue cells. The tissue cells use the sugars for energy. In people with type 1 diabetes, the sugars build up in the blood instead of going into the tissue cells. As a result, high blood sugar (hyperglycemia) develops. Without insulin, the body breaks down fat cells for the needed energy. This breakdown of fat cells produces acid chemicals (ketones), which increases the acid levels in the body. The effect of either high ketone or sugar (glucose) levels can be life-threatening.  Type 1 diabetes was also previously called juvenile diabetes. It most often occurs before the age of 82, but it can occur at any age. RISK FACTORS A person is predisposed to developing type 1 diabetes if someone in his or her family has the disease and is exposed to certain additional environmental triggers.  SYMPTOMS  Symptoms of type 1 diabetes may develop gradually over days to weeks or suddenly. The symptoms occur due to hyperglycemia. The symptoms can include:   Increased thirst (polydipsia).  Increased urination (polyuria).  Increased urination during the night (nocturia).  Weight loss. This weight loss may be rapid.  Frequent, recurring infections.  Tiredness (fatigue).  Weakness.  Vision changes, such as blurred vision.  Fruity smell to your breath.  Abdominal pain.  Nausea or vomiting. DIAGNOSIS  Type 1 diabetes is diagnosed when symptoms of diabetes are present and when blood glucose levels are increased. Your blood glucose level may be checked by one or more of the following blood tests:  A fasting blood glucose test. You will not be allowed to eat for at least 8 hours before a blood sample is  taken.  A random blood glucose test. Your blood glucose is checked at any time of the day regardless of when you ate.  A hemoglobin A1c blood glucose test. A hemoglobin A1c test provides information about blood glucose control over the previous 3 months. TREATMENT  Although type 1 diabetes cannot be prevented, it can be managed with insulin, diet, and exercise.  You will need to take insulin daily to keep blood glucose in the desired range.  You will need to match insulin dosing with exercise and healthy food choices. The treatment goal is to maintain the before-meal blood sugar (preprandial glucose) level at 70 130 mg/dL.  HOME CARE INSTRUCTIONS   Have your hemoglobin A1c level checked twice a year.  Perform daily blood glucose monitoring as directed by your caregiver.  Monitor urine ketones when you are ill and as directed by your caregiver.  Take your insulin as directed by your caregiver to maintain your blood glucose level in the desired range.  Never run out of insulin. It is needed every day.  Adjust insulin based on your intake of carbohydrates. Carbohydrates can raise blood glucose levels but need to be included in your diet. Carbohydrates provide vitamins, minerals, and fiber, which are an essential part of a healthy diet. Carbohydrates are found in fruits, vegetables, whole grains, dairy products, legumes, and foods containing added sugars.    Eat healthy foods. Alternate 3 meals with 3 snacks.  Maintain a healthy weight.  Carry a medical alert card or wear your medical alert jewelry.  Carry a 15 gram carbohydrate snack with you  at all times to treat low blood glucose (hypoglycemia). Some examples of 15 gram carbohydrate snacks include:  Glucose tablets, 3 or 4.   Glucose gel, 15 gram tube.  Raisins, 2 tablespoons (24 grams).  Jelly beans, 6.  Animal crackers, 8.  Fruit juice, regular soda, or low-fat milk, 4 ounces (120 mL).  Gummy treats,  9.    Recognize hypoglycemia. Hypoglycemia occurs with blood glucose levels of 70 mg/dL and below. The risk for hypoglycemia increases when fasting or skipping meals, during or after intense exercise, and during sleep. Hypoglycemia symptoms can include:  Tremors or shakes.  Decreased ability to concentrate.  Sweating.  Increased heart rate.  Headache.  Dry mouth.  Hunger.  Irritability.  Anxiety.  Restless sleep.  Altered speech or coordination.  Confusion.  Treat hypoglycemia promptly. If you are alert and able to safely swallow, follow the 15:15 rule:  Take 15 20 grams of rapid-acting glucose or carbohydrate. Rapid-acting options include glucose gel, glucose tablets, or 4 ounces (120 mL) of fruit juice, regular soda, or low-fat milk.  Check your blood glucose level 15 minutes after taking the glucose.   Take 15 20 grams more of glucose if the repeat blood glucose level is still 70 mg/dL or below.  Eat a meal or snack within 1 hour once blood glucose levels return to normal.  Be alert to polyuria and polydipsia, which are early signs of hyperglycemia. An early awareness of hyperglycemia allows for prompt treatment. Treat hyperglycemia as directed by your caregiver.  Engage in at least 150 minutes of moderate-intensity physical activity a week, spread over at least 3 days of the week or as directed by your caregiver.  Adjust your insulin dosing and food intake as needed if you start a new exercise or sport.  Follow your sick day plan at any time you are unable to eat or drink as usual.   Avoid tobacco use.  Limit alcohol intake to no more than 1 drink per day for nonpregnant women and 2 drinks per day for men. You should drink alcohol only when you are also eating food. Talk with your caregiver about whether alcohol is safe for you. Tell your caregiver if you drink alcohol several times a week.  Follow up with your caregiver regularly.  Schedule an eye exam  within 5 years of diagnosis and then annually.  Perform daily skin and foot care. Examine your skin and feet daily for cuts, bruises, redness, nail problems, bleeding, blisters, or sores. A foot exam by a caregiver should be done annually.  Brush your teeth and gums at least twice a day and floss at least once a day. Follow up with your dentist regularly.  Share your diabetes management plan with your workplace or school.  Stay up-to-date with immunizations.  Learn to manage stress.  Obtain ongoing diabetes education and support as needed.  Participate or seek rehabilitation as needed to maintain or improve independence and quality of life. Request a physical or occupational therapy referral if you are having foot or hand numbness or difficulties with grooming, dressing, eating, or physical activity. SEEK MEDICAL CARE IF:   You are unable to eat food or drink fluids for more than 6 hours.  You have nausea and vomiting for more than 6 hours.  Your blood glucose level is over 240 mg/dL.  There is a change in mental status.  You develop an additional serious illness.  You have diarrhea for more than 6 hours.  You have been  sick or have had a fever for a couple of days and are not getting better.  You have pain during any physical activity. SEEK IMMEDIATE MEDICAL CARE IF:  You have difficulty breathing.  You have moderate to large ketone levels. MAKE SURE YOU:  Understand these instructions.  Will watch your condition.  Will get help right away if you are not doing well or get worse. Document Released: 01/03/2000 Document Revised: 09/30/2011 Document Reviewed: 08/04/2011 [facility] Patient Information 2014 [place].

## 2013-02-02 NOTE — Assessment & Plan Note (Signed)
Glucose running high at home. Plan to go up on her Novolog to 16 unit TID from 14 unit. Continue Lantus at 30 unit QHS. Continue home glucose monitoring. F/U in 2 months for A1C check.

## 2013-02-02 NOTE — Assessment & Plan Note (Addendum)
Etiology unclear. Neuro exam and MRI normal. Stable with walker. Tramadol prn pain S/P PT evaluation few weeks ago. May refer again at next visit if no improvement.

## 2013-02-03 ENCOUNTER — Other Ambulatory Visit: Payer: Self-pay | Admitting: Family Medicine

## 2013-02-03 MED ORDER — TRAMADOL HCL 50 MG PO TABS: 50.0000 mg | ORAL_TABLET | Freq: Three times a day (TID) | ORAL | Status: DC | PRN

## 2013-02-22 ENCOUNTER — Telehealth: Payer: Self-pay | Admitting: Family Medicine

## 2013-02-22 ENCOUNTER — Other Ambulatory Visit: Payer: Self-pay | Admitting: Family Medicine

## 2013-02-22 DIAGNOSIS — R269 Unspecified abnormalities of gait and mobility: Secondary | ICD-10-CM

## 2013-02-22 NOTE — Telephone Encounter (Signed)
Maretha from [facility] is calling requesting a referral for PT for patient due to increased pain & abnormal gait.

## 2013-02-22 NOTE — Telephone Encounter (Signed)
Referral has been ordered to PT.

## 2013-02-23 NOTE — Telephone Encounter (Signed)
Once she is done with PT and they recommend she is steady enough to exercise then I can give her the prescription, I can't give prescription now due to her high risk for fall, she can easily fall on a thread mill.

## 2013-02-23 NOTE — Telephone Encounter (Signed)
Pt would like to know if you know anything about writing her a note or rx to go to the [facility] for exercise?  She is interested in working out some and does get out on warm days to walk in the park with her home health aide.  Please advise. Bates Collington,CMA

## 2013-02-24 NOTE — Telephone Encounter (Signed)
Pt informed.  She also wanted to let the MD know that she is going to see Dr. Prudencio Burly ([facility]) on 04/07/2013. Barbara Clark, Barbara Clark

## 2013-03-24 ENCOUNTER — Telehealth: Payer: Self-pay | Admitting: Family Medicine

## 2013-03-24 NOTE — Telephone Encounter (Signed)
Maretha from [employer] for [employer].  Pt has never heard anything about her referral to physical therapy. Pt can be reached at [phone] Please advise

## 2013-03-24 NOTE — Telephone Encounter (Signed)
Spoke with pt @ below number (and placed in pts chart).  Advised that it seemed that PT was unable to contact her.   Gave pt number and she will call to schedule. Barbara Clark, [name]

## 2013-04-04 ENCOUNTER — Ambulatory Visit: Payer: Medicaid Other | Attending: Family Medicine | Admitting: Physical Therapy

## 2013-04-04 DIAGNOSIS — R262 Difficulty in walking, not elsewhere classified: Secondary | ICD-10-CM | POA: Insufficient documentation

## 2013-04-04 DIAGNOSIS — IMO0001 Reserved for inherently not codable concepts without codable children: Secondary | ICD-10-CM | POA: Insufficient documentation

## 2013-04-04 DIAGNOSIS — R5381 Other malaise: Secondary | ICD-10-CM | POA: Insufficient documentation

## 2013-04-04 DIAGNOSIS — R269 Unspecified abnormalities of gait and mobility: Secondary | ICD-10-CM | POA: Insufficient documentation

## 2013-04-20 ENCOUNTER — Ambulatory Visit (INDEPENDENT_AMBULATORY_CARE_PROVIDER_SITE_OTHER): Payer: Medicaid Other | Admitting: Family Medicine

## 2013-04-20 ENCOUNTER — Encounter: Payer: Self-pay | Admitting: Family Medicine

## 2013-04-20 VITALS — BP 137/74 | HR 103 | Temp 97.4°F | Ht 67.0 in | Wt 180.0 lb

## 2013-04-20 DIAGNOSIS — F339 Major depressive disorder, recurrent, unspecified: Secondary | ICD-10-CM

## 2013-04-20 DIAGNOSIS — E109 Type 1 diabetes mellitus without complications: Secondary | ICD-10-CM

## 2013-04-20 DIAGNOSIS — R269 Unspecified abnormalities of gait and mobility: Secondary | ICD-10-CM

## 2013-04-20 LAB — POCT GLYCOSYLATED HEMOGLOBIN (HGB A1C): Hemoglobin A1C: 9.3

## 2013-04-20 NOTE — Patient Instructions (Addendum)
Ms Villeda, it was nice seeing you today, however am afraid your DM is not very well controlled hence, I will like you to increase your insulin to 40 unit daily, continue your Novolog 16 unit TID, I will like you to continue home glucose check, if less than 60, please hold your insulin and give [place] a call. I will like to see you back in 3 wks.   Fall Prevention and Home Safety Falls cause injuries and can affect all age groups. It is possible to prevent falls.  HOW TO PREVENT FALLS  Wear shoes with rubber soles that do not have an opening for your toes.  Keep the inside and outside of your house well lit.  Use night lights throughout your home.  Remove clutter from floors.  Clean up floor spills.  Remove throw rugs or fasten them to the floor with carpet tape.  Do not place electrical cords across pathways.  Put grab bars by your tub, shower, and toilet. Do not use towel bars as grab bars.  Put handrails on both sides of the stairway. Fix loose handrails.  Do not climb on stools or stepladders, if possible.  Do not wax your floors.  Repair uneven or unsafe sidewalks, walkways, or stairs.  Keep items you use a lot within reach.  Be aware of pets.  Keep emergency numbers next to the telephone.  Put smoke detectors in your home and near bedrooms. Ask your doctor what other things you can do to prevent falls. Document Released: 11/01/2008 Document Revised: 07/07/2011 Document Reviewed: 04/07/2011 [facility] Patient Information 2014 [place], [place].

## 2013-04-20 NOTE — Progress Notes (Signed)
Subjective:     Patient ID: Barbara Clark, female   DOB: Oct 23, 1975, 38 y.o.   MRN: [id number]  HPI DM 1:Patient is currently on Novolog 16 unit tid and Lantus 30 unit daily, her home capillary glucose ranges from 110 to 516, she denies any hypoglycemic episodes, she has more 400 values in the last few week,she stated she had done well on 45 units of Lantus in the past but she was cut back during hospital admission. Depression: Patient was recently seen at [facility] and her medication was changed to Zoloft 25 mg and seroquel 200mg  qd, she stated she is doing well on these two regimen. Gait:Patient still feels unsteady with her gait, she had completed PT and was given a cain for support,she was told at the rehabilitation center that she needs more sessions of PT but her  Insurance would not cover it so she is trying to go to the [facility] center for exercise.    Current Outpatient Prescriptions on File Prior to Visit  Medication Sig Dispense Refill  . insulin aspart (NOVOLOG) 100 UNIT/ML injection Inject 16 Units into the skin 3 (three) times daily with meals.       . insulin glargine (LANTUS) 100 UNIT/ML injection Inject 30 Units into the skin at bedtime.      . traMADol (ULTRAM) 50 MG tablet Take 1 tablet (50 mg total) by mouth every 8 (eight) hours as needed.  30 tablet  0   No current facility-administered medications on file prior to visit.   Past Medical History  Diagnosis Date  . Diabetes mellitus     Type I  . Depression   . Anxiety   . 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   Cardiovascular: Negative.   Gastrointestinal: Negative.   Musculoskeletal: Positive for gait problem.  All other systems reviewed and are negative.   Filed Vitals:   04/20/13 1352  BP: 137/74  Pulse: 103  Temp: 97.4 F (36.3 C)  TempSrc: Oral  Height: 5\' 7"  (1.702 m)  Weight: 180 lb (81.647 kg)       Objective:   Physical Exam   Nursing note and vitals reviewed. Constitutional: She is oriented to person, place, and time. She appears well-developed. No distress.  Cardiovascular: Normal rate, regular rhythm, normal heart sounds and intact distal pulses.   No murmur heard. Pulmonary/Chest: Effort normal and breath sounds normal. No respiratory distress. She has no wheezes.  Abdominal: Soft. Bowel sounds are normal. She exhibits no distension and no mass. There is no tenderness.  Musculoskeletal:  Diabetic Foot Check -  Appearance - no lesions, ulcers or calluses Skin - no unusual pallor or redness Monofilament testing - Normal Right - Great toe, medial, central, lateral ball and posterior foot intact Left - Great toe, medial, central, lateral ball and posterior foot intact  Ambulation: Supported with walking cane,more weight on her LLL and the cane than RLL.  Neurological: She is alert and oriented to person, place, and time.  Psychiatric: She has a normal mood and affect. Her behavior is normal. Judgment and thought content normal.       Assessment:     DM type 1 Depression ABnormal gait     Plan:     Check problem list.

## 2013-04-21 NOTE — Assessment & Plan Note (Signed)
Her A1C worsened to 9.3 despite good compliance. Since she had done well on 45 unit Lantus in the past I recommended 40 unit Lantus. Advised to continue home glucose monitoring. F/U in 3 wks for reassessment.

## 2013-04-21 NOTE — Assessment & Plan Note (Signed)
Still unsteady. PT still recommended. She is requesting to get exercise at [facility] but not clear what equipment she would be working on there. I suggested exercise recommendation from her physical therapist who can properly recommend exercise equipment to her. She will get in touch with the rehabilitation center for recommendation.

## 2013-04-21 NOTE — Assessment & Plan Note (Signed)
Currently stable on medication. No at danger to self or others.

## 2013-04-24 ENCOUNTER — Other Ambulatory Visit: Payer: Self-pay | Admitting: *Deleted

## 2013-04-24 MED ORDER — TRAMADOL HCL 50 MG PO TABS: 50.0000 mg | ORAL_TABLET | Freq: Three times a day (TID) | ORAL | Status: DC | PRN

## 2013-05-05 ENCOUNTER — Encounter: Payer: Self-pay | Admitting: *Deleted

## 2013-05-05 NOTE — Telephone Encounter (Signed)
This encounter was created in error - please disregard.

## 2013-05-05 NOTE — Progress Notes (Unsigned)
Received a refill request from [facility] regarding medication refill for Lantus Solostar 100 Units/ML. Current prescription is 46 units subcutaneously at bedtime. Prescription changed to Lantus Solostar 40 units at bedtime per Dr. Gwendlyn Deutscher. Prescription faxed back to [facility]. Derl Barrow, RN

## 2013-05-11 ENCOUNTER — Ambulatory Visit (INDEPENDENT_AMBULATORY_CARE_PROVIDER_SITE_OTHER): Payer: Medicaid Other | Admitting: Family Medicine

## 2013-05-11 ENCOUNTER — Encounter: Payer: Self-pay | Admitting: Family Medicine

## 2013-05-11 VITALS — BP 110/80 | HR 89 | Temp 98.3°F | Wt 184.0 lb

## 2013-05-11 DIAGNOSIS — R6884 Jaw pain: Secondary | ICD-10-CM | POA: Insufficient documentation

## 2013-05-11 DIAGNOSIS — E109 Type 1 diabetes mellitus without complications: Secondary | ICD-10-CM

## 2013-05-11 LAB — GLUCOSE, CAPILLARY: Glucose-Capillary: 133 mg/dL — ABNORMAL HIGH (ref 70–99)

## 2013-05-11 MED ORDER — INSULIN GLARGINE 100 UNIT/ML ~~LOC~~ SOLN: 40.0000 [IU] | Freq: Every day | SUBCUTANEOUS | Status: DC

## 2013-05-11 MED ORDER — INSULIN GLARGINE 100 UNIT/ML ~~LOC~~ SOLN: 45.0000 [IU] | Freq: Every day | SUBCUTANEOUS | Status: DC

## 2013-05-11 NOTE — Patient Instructions (Signed)
Blood Glucose Monitoring, Adult  Monitoring your blood glucose (also know as blood sugar) helps you to manage your diabetes. It also helps you and your health care provider monitor your diabetes and determine how well your treatment plan is working.  WHY SHOULD YOU MONITOR YOUR BLOOD GLUCOSE?  · It can help you understand how food, exercise, and medicine affect your blood glucose.  · It allows you to know what your blood glucose is at any given moment. You can quickly tell if you are having low blood glucose (hypoglycemia) or high blood glucose (hyperglycemia).  · It can help you and your health care provider know how to adjust your medicines.  · It can help you understand how to manage an illness or adjust medicine for exercise.  WHEN SHOULD YOU TEST?  Your health care provider will help you decide how often you should check your blood glucose. This may depend on the type of diabetes you have, your diabetes control, or the types of medicines you are taking. Be sure to write down all of your blood glucose readings so that this information can be reviewed with your health care provider. See below for examples of testing times that your health care provider may suggest.  Type 1 Diabetes  · Test 4 times a day if you are in good control, using an insulin pump, or perform multiple daily injections.  · If your diabetes is not well-controlled or if you are sick, you may need to monitor more often.  · It is a good idea to also monitor:  · Before and after exercise.  · Between meals and 2 hours after a meal.  · Occasionally between 2:00 to 3:00 am.  Type 2 Diabetes  · It can vary with each person, but generally, if you are on insulin, test 4 times a day.  · If you take medicines by mouth (orally), test 2 times a day.  · If you are on a controlled diet, test once a day.  · If your diabetes is not well controlled or if you are sick, you may need to monitor more often.  HOW TO MONITOR YOUR BLOOD GLUCOSE  Supplies  Needed  · Blood glucose meter.  · Test strips for your meter. Each meter has its own strips. You must use the strips that go with your own meter.  · A pricking needle (lancet).  · A device that holds the lancet (lancing device).  · A journal or log book to write down your results.  Procedure  · Wash your hands with soap and water. Alcohol is not preferred.  · Prick the side of your finger (not the tip) with the lancet.  · Gently milk the finger until a small drop of blood appears.  · Follow the instructions that come with your meter for inserting the test strip, applying blood to the strip, and using your blood glucose meter.  Other Areas to Get Blood for Testing  Some meters allow you to use other areas of your body (other than your finger) to test your blood. These areas are called alternative sites. The most common alternative sites are:  · The forearm.  · The thigh.  · The back area of the lower leg.  · The palm of the hand.  The blood flow in these areas is slower. Therefore, the blood glucose values you get may be delayed, and the numbers are different from what you would get from your fingers. Do not use alternative   sites if you think you are having hypoglycemia. Your reading will not be accurate. Always use a finger if you are having hypoglycemia. Also, if you cannot feel your lows (hypoglycemia unawareness), always use your fingers for your blood glucose checks.  ADDITIONAL TIPS FOR GLUCOSE MONITORING  · Do not reuse lancets.  · Always carry your supplies with you.  · All blood glucose meters have a 24-hour "hotline" number to call if you have questions or need help.  · Adjust (calibrate) your blood glucose meter with a control solution after finishing a few boxes of strips.  BLOOD GLUCOSE RECORD KEEPING  It is a good idea to keep a daily record or log of your blood glucose readings. Most glucose meters, if not all, keep your glucose records stored in the meter. Some meters come with the ability to download  your records to your home computer. Keeping a record of your blood glucose readings is especially helpful if you are wanting to look for patterns. Make notes to go along with the blood glucose readings because you might forget what happened at that exact time. Keeping good records helps you and your health care provider to work together to achieve good diabetes management.   Document Released: 01/08/2003 Document Revised: 09/07/2012 Document Reviewed: 05/30/2012  ExitCare® Patient Information ©2014 [employer].

## 2013-05-11 NOTE — Progress Notes (Signed)
Subjective:     Patient ID: Barbara Clark, female   DOB: [birth year], 38 y.o.   MRN: [id number]  HPI DM:Here for follow up, currently on Novolog 16 unit with each meal and Lantus 40 unit daily, her lowest capillary glucose reading in the last 2 wks has been 170 and highest is 280 all postpandral,denies hypoglycemic episode. Last meal today was 4 hrs ago, feels well, here for follow up. Jaw pain: C/O right jaw pain for one week, worse at night especially with jaw movement. Denies any trauma to her jaw. Denies grinding of her teeth. She uses Tramadol which helps a lot.  Current Outpatient Prescriptions on File Prior to Visit  Medication Sig Dispense Refill  . insulin aspart (NOVOLOG) 100 UNIT/ML injection Inject 16 Units into the skin 3 (three) times daily with meals.       . insulin glargine (LANTUS) 100 UNIT/ML injection Inject 40 Units into the skin at bedtime.       Marland Kitchen QUEtiapine (SEROQUEL) 200 MG tablet Take 200 mg by mouth at bedtime.      . sertraline (ZOLOFT) 25 MG tablet Take 25 mg by mouth daily.      . traMADol (ULTRAM) 50 MG tablet Take 1 tablet (50 mg total) by mouth every 8 (eight) hours as needed.  90 tablet  0   No current facility-administered medications on file prior to visit.   Past Medical History  Diagnosis Date  . Diabetes mellitus     Type I  . Depression   . Anxiety   . HSV-2 (herpes simplex virus 2) infection 2012    serology     Review of Systems  Constitutional: Negative.   Respiratory: Negative.   Cardiovascular: Negative.   Gastrointestinal: Negative.   Neurological: Negative for dizziness, tremors and syncope.  All other systems reviewed and are negative.  Filed Vitals:   05/11/13 1404 05/11/13 1414  BP: 94/68 110/80  Pulse: 89   Temp: 98.3 F (36.8 C)   TempSrc: Oral   Weight: 184 lb (83.462 kg)        Objective:   Physical Exam  Nursing note and vitals reviewed. Constitutional: She is oriented to person, place, and time. She appears  well-developed. No distress.  HENT:  Head: Normocephalic and atraumatic.    Right Ear: Tympanic membrane and ear canal normal. No swelling or tenderness.  Left Ear: Tympanic membrane and ear canal normal. No swelling or tenderness.  Mouth/Throat: Oropharynx is clear and moist.    Cardiovascular: Normal rate, regular rhythm, normal heart sounds and intact distal pulses.   No murmur heard. Pulmonary/Chest: Effort normal and breath sounds normal. No respiratory distress. She has no wheezes. She exhibits no tenderness.  Abdominal: Soft. Bowel sounds are normal. She exhibits no distension and no mass. There is no tenderness.  Musculoskeletal: Normal range of motion. She exhibits no edema.  Neurological: She is alert and oriented to person, place, and time.       Assessment:     DM 1 Jaw pain     Plan:     Check problem list.

## 2013-05-11 NOTE — Assessment & Plan Note (Signed)
Capillary glucose improved a lot since last visit. Prior to dose adjustment her Glucose was more than 400. On Lantus 40 unit she ranges between 170-200. Capillary glucose checked today during her visit is 133 which is even better. Plan to continue Lantus 40 unit for now. F/U in 2 wks for reassessment. Advised to continue home glucose check, she agreed with all plan.

## 2013-05-11 NOTE — Assessment & Plan Note (Signed)
Maybe TMJ. Well controlled on Tramadol. No dental anomaly although she already has appointment with the dentist. F/U with dentist as planned.

## 2013-05-17 ENCOUNTER — Other Ambulatory Visit: Payer: Medicaid Other

## 2013-05-17 DIAGNOSIS — E109 Type 1 diabetes mellitus without complications: Secondary | ICD-10-CM

## 2013-05-17 NOTE — Progress Notes (Signed)
URINE MICRO/CREAT DONE TODAY Dalyah Pla

## 2013-05-18 LAB — MICROALBUMIN / CREATININE URINE RATIO
Creatinine, Urine: 130.1 mg/dL
Microalb Creat Ratio: 3.8 mg/g (ref 0.0–30.0)
Microalb, Ur: 0.5 mg/dL (ref 0.00–1.89)

## 2013-05-25 ENCOUNTER — Ambulatory Visit (INDEPENDENT_AMBULATORY_CARE_PROVIDER_SITE_OTHER): Payer: Medicaid Other | Admitting: Family Medicine

## 2013-05-25 ENCOUNTER — Encounter: Payer: Self-pay | Admitting: Family Medicine

## 2013-05-25 VITALS — BP 103/71 | HR 102 | Temp 98.3°F | Ht 67.0 in | Wt 168.0 lb

## 2013-05-25 DIAGNOSIS — R6884 Jaw pain: Secondary | ICD-10-CM

## 2013-05-25 DIAGNOSIS — R269 Unspecified abnormalities of gait and mobility: Secondary | ICD-10-CM

## 2013-05-25 DIAGNOSIS — E109 Type 1 diabetes mellitus without complications: Secondary | ICD-10-CM

## 2013-05-25 NOTE — Patient Instructions (Signed)
Monitoring for Diabetes  There are two blood tests that help you monitor and manage your diabetes. These include:  · An A1c (hemoglobin A1c) test.  · This test is an average of your glucose (or blood sugar) control over the past 3 months.  · This is recommended as a way for you and your caregiver to understand how well your glucose levels are controlled on the average.  · Your A1c goal will be determined by your caregiver, but it is usually best if it is less than 6.5% to 7.0%.  · Glucose (sugar) attaches itself to red blood cells. The amount of glucose then can then be measured. The amount of glucose on the cells depends on how high your blood glucose has been.  · SMBG test (self-monitoring blood glucose).  · Using a blood glucose monitor (meter) to do SMBG testing is an easy way to monitor the amount of glucose in your blood and can help you improve your control. The monitor will tell you what your blood glucose is at that very moment. Every person with diabetes should have a blood glucose monitor and know how to use it. The better you control your blood sugar on a daily basis, the better your A1c levels will be.  HOW OFTEN SHOULD I HAVE AN A1C LEVEL?  · Every 3 months if your diabetes is not well controlled or if therapy has changed.  · Every 6 months if you are meeting your treatment goals.  HOW OFTEN SHOULD I DO SMBG TESTING?   Your caregiver will recommend how often you should test. Testing times are based on the kind of medicine you take, type of diabetes you have, and your blood glucose control. Testing times can include:  · Type 1 diabetes: test 3 or 4 times a day or as directed.  · Type 2 diabetes and if you are taking insulin and diabetes pills: test 3 or 4 times a day or as directed.  · If you are taking diabetes pills only and not reaching your target A1c: test 2 to 4 times a day or as directed.  · If you are taking diabetes pills and are controlling your diabetes well with diet and exercise, your  caregiver will help you decide what is appropriate.  WHAT TIME OF DAY SHOULD I TEST?   The best time of day to test your blood glucose depends on medications, mealtimes, exercise, and blood glucose control. It is best to test at different times because this will help you know how you are doing throughout the day. Your caregiver will help you decide what is best.  WHAT SHOULD MY BLOOD GLUCOSE BE?  Blood glucose target goals may vary depending on each persons needs, whether they have type 1 or type 2 diabetes or what medications they are taking. However, as a general rule, blood glucose should be:  · Before meals   70-130 mg/dl.  · After meals    ..less than 180 mg/dl.  CHECK YOUR BLOOD GLUCOSE IF:  · You have symptoms of low blood sugar (hypoglycemia), which may include dizziness, shaking, sweating, chills and confusion.  · You have symptoms of high blood sugar (hyperglycemia), which may include sleepiness, blurred vision, frequent urination and excessive thirst.  · You are learning how meals, physical activity and medicine affect your blood glucose level. The more you learn about how various foods, your medications, and activities affect you, the better job you will do of taking care of yourself.  ·   You have a job in which poor control could cause safety problems while driving or operating machinery.  CHECK YOUR BLOOD SUGAR MORE FREQUENTLY:  · If you have medication or dietary changes.  · If you begin taking other kinds of medicines.  · If you become sick or your level of stress increases. With an illness, your blood sugar may even be high without eating.  · Before and after exercise.  Follow your caregiver's testing recommendations during this time.   TO DISPOSE OF SHARPS:  Each city or state may have different regulations. Check with your public works or waste management department.  · Sharps containers can be purchased from pharmacies.  · Place all used sharps in a container. You do not need to replace any  protective covers over the needle or break the needle.  · Sharps should be contained in a ridge, leakproof, puncture-resistant container.  · Plastic detergent bottle.  · Bleach bottle.  · When container is almost full, add a solution that is 1 part laundry bleach and 9 parts tap water (it is okay to use undiluted bleach if you wish). You may want to wear gloves since bleach can damage tissue. Let the solution sit for 30 minutes.  · Carefully pour all the liquid into the sanitary sewer. Be sure to prevent the sharps from falling out.  · Once liquid is drained, reseal the container with lid and tape it shut with duct tape. This will prevent the cap from coming off.  · Dispose of the container with your regular household trash and waste. It is a good idea to let your trash hauler know that you will be disposing of sharps.  Document Released: 01/08/2003 Document Revised: 09/30/2011 Document Reviewed: 07/09/2008  ExitCare® Patient Information ©2014 [employer].

## 2013-05-25 NOTE — Addendum Note (Signed)
Addended by: Andrena Mews T on: 05/25/2013 05:04 PM   Modules accepted: Orders

## 2013-05-25 NOTE — Assessment & Plan Note (Signed)
Stable with the use of cane. No recent fall. Continue home exercise. I will complete her disability form.

## 2013-05-25 NOTE — Assessment & Plan Note (Addendum)
Seem to be doing better with her glucose control. F/U in 2 months for A1C. Continue home glucose monitoring. F/U for lab in 1wk (BMP and lipid profile)

## 2013-05-25 NOTE — Assessment & Plan Note (Signed)
Still symptomatic. Tylenol prn pain. Appointment with Dentist next week to ensure she does not have dental issue causing her jaw pain. Patient advised to follow up with me after dental assessment if dental issue is not the cause of her jaw pain. She verbalized understanding.

## 2013-05-25 NOTE — Progress Notes (Signed)
Subjective:     Patient ID: Barbara Clark, female   DOB: [birth year], 38 y.o.   MRN: [id number]  HPI DM1: Still on Lantus 40 unit qhs and novolog 16 unit TID, she denies hypoglycemic episode, her glucose ranges from 119-220, feels well otherwise. Gait:  Still needing cane for support, she feels she cannot go back to school due to her gait issue and emotions. She has been placed on disability list at school. She is requesting school disability form be completed for her. Jaw pain: Still having right jaw pain, patient is scheduled to see her Dentist on the 18th of this month.  Current Outpatient Prescriptions on File Prior to Visit  Medication Sig Dispense Refill  . insulin aspart (NOVOLOG) 100 UNIT/ML injection Inject 16 Units into the skin 3 (three) times daily with meals.       . insulin glargine (LANTUS) 100 UNIT/ML injection Inject 0.4 mLs (40 Units total) into the skin at bedtime.  10 mL  0  . QUEtiapine (SEROQUEL) 200 MG tablet Take 200 mg by mouth at bedtime.      . sertraline (ZOLOFT) 25 MG tablet Take 25 mg by mouth daily.      . traMADol (ULTRAM) 50 MG tablet Take 1 tablet (50 mg total) by mouth every 8 (eight) hours as needed.  90 tablet  0   No current facility-administered medications on file prior to visit.   Past Medical History  Diagnosis Date  . Diabetes mellitus     Type I  . Depression   . Anxiety   . HSV-2 (herpes simplex virus 2) infection 2012    serology      Review of Systems  Respiratory: Negative.   Cardiovascular: Negative.   Gastrointestinal: Negative.   Genitourinary: Negative.   Musculoskeletal: Positive for gait problem. Negative for back pain.       Jaw pain.  All other systems reviewed and are negative.  Filed Vitals:   05/25/13 1358  BP: 103/71  Pulse: 102  Temp: 98.3 F (36.8 C)  TempSrc: Oral  Height: 5\' 7"  (1.702 m)  Weight: 168 lb (76.204 kg)       Objective:   Physical Exam  Nursing note and vitals reviewed. Constitutional: She  is oriented to person, place, and time. She appears well-developed. No distress.  HENT:  Head: Normocephalic and atraumatic.  Neck: Neck supple.  No jaw swelling or erythema. Jaw ROM normal.  Cardiovascular: Normal rate, regular rhythm, normal heart sounds and intact distal pulses.   No murmur heard. Pulmonary/Chest: Effort normal and breath sounds normal. No respiratory distress. She has no wheezes.  Abdominal: Soft. Bowel sounds are normal. She exhibits no distension and no mass. There is no tenderness.  Musculoskeletal: Normal range of motion. She exhibits no edema.  Neurological: She is alert and oriented to person, place, and time.       Assessment:     DM1 Gait abnormality Jaw pain     Plan:     Check problem list.

## 2013-05-30 ENCOUNTER — Telehealth: Payer: Self-pay | Admitting: *Deleted

## 2013-05-30 NOTE — Telephone Encounter (Signed)
Message copied by Valerie Roys on Tue May 30, 2013  4:35 PM ------      Message from: Andrena Mews T      Created: Tue May 30, 2013  4:06 PM       Please call to inform patient,her disability letter is ready for pick up.        ------

## 2013-05-30 NOTE — Telephone Encounter (Signed)
Pt is aware of this. Barbara Clark,CMA  

## 2013-06-14 ENCOUNTER — Other Ambulatory Visit: Payer: Self-pay | Admitting: *Deleted

## 2013-06-15 MED ORDER — GLUCOSE BLOOD VI STRP: ORAL_STRIP | Status: DC

## 2013-06-26 ENCOUNTER — Other Ambulatory Visit: Payer: Self-pay | Admitting: *Deleted

## 2013-06-26 NOTE — Telephone Encounter (Signed)
Request is for Lantus SoloStar 100 Units/ML.  Please see phone note from 05/05/2013. Derl Barrow, RN

## 2013-06-27 MED ORDER — INSULIN GLARGINE 100 UNIT/ML SOLOSTAR PEN: 40.0000 [IU] | PEN_INJECTOR | Freq: Every day | SUBCUTANEOUS | Status: DC

## 2013-08-01 ENCOUNTER — Ambulatory Visit: Payer: Self-pay | Admitting: Family Medicine

## 2013-08-04 ENCOUNTER — Encounter: Payer: Self-pay | Admitting: Family Medicine

## 2013-08-04 ENCOUNTER — Ambulatory Visit (INDEPENDENT_AMBULATORY_CARE_PROVIDER_SITE_OTHER): Payer: Medicaid Other | Admitting: Family Medicine

## 2013-08-04 VITALS — BP 109/75 | HR 89 | Temp 98.1°F | Ht 67.0 in | Wt 179.0 lb

## 2013-08-04 DIAGNOSIS — E109 Type 1 diabetes mellitus without complications: Secondary | ICD-10-CM

## 2013-08-04 DIAGNOSIS — Z13220 Encounter for screening for lipoid disorders: Secondary | ICD-10-CM

## 2013-08-04 DIAGNOSIS — M7989 Other specified soft tissue disorders: Secondary | ICD-10-CM

## 2013-08-04 DIAGNOSIS — R29898 Other symptoms and signs involving the musculoskeletal system: Secondary | ICD-10-CM

## 2013-08-04 HISTORY — DX: Other symptoms and signs involving the musculoskeletal system: R29.898

## 2013-08-04 LAB — POCT GLYCOSYLATED HEMOGLOBIN (HGB A1C): Hemoglobin A1C: 8.7

## 2013-08-04 LAB — LIPID PANEL
Cholesterol: 149 mg/dL (ref 0–200)
HDL: 41 mg/dL (ref >39)
LDL Cholesterol: 99 mg/dL (ref 0–99)
Total CHOL/HDL Ratio: 3.6 Ratio
Triglycerides: 45 mg/dL (ref <150)
VLDL: 9 mg/dL (ref 0–40)

## 2013-08-04 NOTE — Patient Instructions (Signed)
It was nice to see you today, your A1C improved some but not optimal. I will recommend you take 35 unit of your insulin instead of 30 unit. I will like to see you back in 3 wks. I will call physical therapy to discuss your option to prevent further fall.  Fall Prevention and Home Safety Falls cause injuries and can affect all age groups. It is possible to prevent falls.  HOW TO PREVENT FALLS  Wear shoes with rubber soles that do not have an opening for your toes.  Keep the inside and outside of your house well lit.  Use night lights throughout your home.  Remove clutter from floors.  Clean up floor spills.  Remove throw rugs or fasten them to the floor with carpet tape.  Do not place electrical cords across pathways.  Put grab bars by your tub, shower, and toilet. Do not use towel bars as grab bars.  Put handrails on both sides of the stairway. Fix loose handrails.  Do not climb on stools or stepladders, if possible.  Do not wax your floors.  Repair uneven or unsafe sidewalks, walkways, or stairs.  Keep items you use a lot within reach.  Be aware of pets.  Keep emergency numbers next to the telephone.  Put smoke detectors in your home and near bedrooms. Ask your doctor what other things you can do to prevent falls. Document Released: 11/01/2008 Document Revised: 07/07/2011 Document Reviewed: 04/07/2011 [facility] Patient Information 2015 [place], [place]. This information is not intended to replace advice given to you by your health care provider. Make sure you discuss any questions you have with your health care provider.

## 2013-08-04 NOTE — Assessment & Plan Note (Signed)
Currently no pedal swelling. Could be due to stasis. I recommended LL elevation majority of time. Use of compression stocking. Consider diuretic prn if no improvement.

## 2013-08-04 NOTE — Progress Notes (Signed)
Subjective:     Patient ID: Barbara Clark, female   DOB: [birth year], 38 y.o.   MRN: [id number]  HPI DM1:She has been taking Lantus 30 unit daily in addition to her Novolog, her capillary glucose at home has been in the 400 with few less tha 100, she denies hypoglycemic episode. Feet swelling: Every night about 2 months ago. Better in the morning, lifting her foot up helps. At times during the day leg swelling.Whenever she falls it swells more. Last fall was 2 days ago due to leg weakness.   Leg weakness: Patient stated she continues to have weakness of her LL which makes her fall more frequently, her last fall was 2 days ago where she hit her right shin and now has a small ulcer on her right shin which is healing well.  Current Outpatient Prescriptions on File Prior to Visit  Medication Sig Dispense Refill  . insulin aspart (NOVOLOG) 100 UNIT/ML injection Inject 16 Units into the skin 3 (three) times daily with meals.       Marland Kitchen QUEtiapine (SEROQUEL) 200 MG tablet Take 200 mg by mouth at bedtime.      . sertraline (ZOLOFT) 25 MG tablet Take 50 mg by mouth daily.       Marland Kitchen glucose blood (ACCU-CHEK AVIVA PLUS) test strip Use to check capillary glucose three times daily.  100 each  11  . Insulin Glargine (LANTUS SOLOSTAR) 100 UNIT/ML Solostar Pen Inject 40 Units into the skin daily at 10 pm.  5 pen  2  . traMADol (ULTRAM) 50 MG tablet Take 1 tablet (50 mg total) by mouth every 8 (eight) hours as needed.  90 tablet  0   No current facility-administered medications on file prior to visit.   Past Medical History  Diagnosis Date  . Diabetes mellitus     Type I  . Depression   . Anxiety   . HSV-2 (herpes simplex virus 2) infection 2012    serology    Review of Systems  Respiratory: Negative.   Cardiovascular: Negative.   Gastrointestinal: Negative.   Genitourinary: Negative.   All other systems reviewed and are negative.  Filed Vitals:   08/04/13 1044  BP: 109/75  Pulse: 89  Temp: 98.1 F  (36.7 C)  TempSrc: Oral  Height: 5\' 7"  (1.702 m)  Weight: 179 lb (81.194 kg)       Objective:   Physical Exam  Nursing note and vitals reviewed. Constitutional: She appears well-developed. No distress.  Cardiovascular: Normal rate, regular rhythm, normal heart sounds and intact distal pulses.   No murmur heard. Pulmonary/Chest: Effort normal and breath sounds normal. No respiratory distress. She has no wheezes.  Abdominal: Soft. Bowel sounds are normal. She exhibits no distension and no mass. There is no tenderness.  Musculoskeletal: Normal range of motion. She exhibits no edema.  Ambulating slowly with a cane, gait steady with cane.       Assessment:     DM1 Feet swelling B/L LL weakness     Plan:     Check problem list.

## 2013-08-04 NOTE — Assessment & Plan Note (Signed)
A1C improved one point with Lantus 30 units. Still having high glucose at home. Suggested taking Lantus 35 unit at night. Continue home glucose check. F/U in 3-4wks for reassessment.

## 2013-08-04 NOTE — Assessment & Plan Note (Signed)
Trying home exerise to keep strong. She had few session of PT. I will try to get her back in to physical rehab to help with balancing.

## 2013-08-07 ENCOUNTER — Telehealth: Payer: Self-pay | Admitting: Family Medicine

## 2013-08-07 NOTE — Telephone Encounter (Signed)
Lipid profile result discussed with patient. I also reviewed her Insulin regimen, she stated she took 40 unit of her Lantus last night due to very elevated glucose level. But at baseline she is to take 35 unit daily. She will continue to monitor her capillary glucose and adjust regimen as needed. Also on Novolog.

## 2013-08-29 ENCOUNTER — Encounter: Payer: Self-pay | Admitting: Family Medicine

## 2013-08-29 ENCOUNTER — Ambulatory Visit (INDEPENDENT_AMBULATORY_CARE_PROVIDER_SITE_OTHER): Payer: Medicaid Other | Admitting: Family Medicine

## 2013-08-29 VITALS — BP 124/82 | HR 80 | Ht 67.0 in | Wt 179.0 lb

## 2013-08-29 DIAGNOSIS — E109 Type 1 diabetes mellitus without complications: Secondary | ICD-10-CM

## 2013-08-29 DIAGNOSIS — R269 Unspecified abnormalities of gait and mobility: Secondary | ICD-10-CM

## 2013-08-29 DIAGNOSIS — M7989 Other specified soft tissue disorders: Secondary | ICD-10-CM

## 2013-08-29 NOTE — Assessment & Plan Note (Signed)
She continued to fall. I am concern she might hurt herself. I have tried to refer to PT multiple times but was told insurance will not cover. I will try referring to integrative therapy. Patient informed this is not a guarantee that her insurance will cover this but we will see. Fall precaution handout was given.

## 2013-08-29 NOTE — Assessment & Plan Note (Signed)
Only at night. No sign of Kidney disease. Creatinine level normal. Unlikely related to cardiac problem or venous stasis. Diuretic not recommended due to low normal BP with high risk for fall. I again recommended LL elevation as well as compression stockings. She agreed with plan.

## 2013-08-29 NOTE — Assessment & Plan Note (Signed)
Doing well on current regimen. Continue for now. RTC in 2 months for A1C.

## 2013-08-29 NOTE — Patient Instructions (Signed)
Fall Prevention and Home Safety °Falls cause injuries and can affect all age groups. It is possible to prevent falls.  °HOW TO PREVENT FALLS °· Wear shoes with rubber soles that do not have an opening for your toes. °· Keep the inside and outside of your house well lit. °· Use night lights throughout your home. °· Remove clutter from floors. °· Clean up floor spills. °· Remove throw rugs or fasten them to the floor with carpet tape. °· Do not place electrical cords across pathways. °· Put grab bars by your tub, shower, and toilet. Do not use towel bars as grab bars. °· Put handrails on both sides of the stairway. Fix loose handrails. °· Do not climb on stools or stepladders, if possible. °· Do not wax your floors. °· Repair uneven or unsafe sidewalks, walkways, or stairs. °· Keep items you use a lot within reach. °· Be aware of pets. °· Keep emergency numbers next to the telephone. °· Put smoke detectors in your home and near bedrooms. °Ask your doctor what other things you can do to prevent falls. °Document Released: 11/01/2008 Document Revised: 07/07/2011 Document Reviewed: 04/07/2011 °[employer]® Patient Information ©2015 [employer]. This information is not intended to replace advice given to you by your health care provider. Make sure you discuss any questions you have with your health care provider. ° °

## 2013-08-29 NOTE — Progress Notes (Signed)
Subjective:     Patient ID: Barbara Clark, female   DOB: 1975/12/15, 38 y.o.   MRN: [id number]  HPI DM1: Still taking Lantus 35 unit qd plus Novolog 16 unit TID, her CBG at home ranges from highest of 299, lowest of 59 (did not eat at that time).Denies hypoglycemic episode. Fall:Fell last week while going to her bathroom, she continue to have difficulty with her gait.  Feet swell:C/O B/L feet swelling for the last 2-3 months ago.Mostly at night, denies any pain. No change in urine color. No other concern.  Current Outpatient Prescriptions on File Prior to Visit  Medication Sig Dispense Refill  . insulin aspart (NOVOLOG) 100 UNIT/ML injection Inject 16 Units into the skin 3 (three) times daily with meals.       Marland Kitchen QUEtiapine (SEROQUEL) 200 MG tablet Take 300 mg by mouth at bedtime.       . sertraline (ZOLOFT) 25 MG tablet Take 50 mg by mouth daily.       Marland Kitchen glucose blood (ACCU-CHEK AVIVA PLUS) test strip Use to check capillary glucose three times daily.  100 each  11  . traMADol (ULTRAM) 50 MG tablet Take 1 tablet (50 mg total) by mouth every 8 (eight) hours as needed.  90 tablet  0   No current facility-administered medications on file prior to visit.   Past Medical History  Diagnosis Date  . Diabetes mellitus     Type I  . Depression   . Anxiety   . HSV-2 (herpes simplex virus 2) infection 2012    serology     Review of Systems  Respiratory: Negative.   Cardiovascular: Negative.   Gastrointestinal: Negative.   Genitourinary: Negative.   Musculoskeletal: Positive for gait problem.       Feet swelling  All other systems reviewed and are negative.  Filed Vitals:   08/29/13 1022  BP: 124/82  Pulse: 80  Height: 5\' 7"  (1.702 m)  Weight: 179 lb (81.194 kg)       Objective:   Physical Exam  Nursing note and vitals reviewed. Constitutional: She is oriented to person, place, and time. She appears well-developed. No distress.  Cardiovascular: Normal rate, regular rhythm and  normal heart sounds.   No murmur heard. Pulmonary/Chest: Effort normal and breath sounds normal. No respiratory distress. She has no wheezes.  Abdominal: Soft. Bowel sounds are normal. She exhibits no distension and no mass. There is no tenderness.  Musculoskeletal: Normal range of motion. She exhibits no edema.  Neurological: She is alert and oriented to person, place, and time. No cranial nerve deficit.  Unsteady gait, uses cain for support.  Psychiatric: She has a normal mood and affect.       Assessment:     DM1 Gait problem: Fall Feet edema     Plan:     Check problem list.

## 2013-09-19 ENCOUNTER — Other Ambulatory Visit: Payer: Self-pay | Admitting: Family Medicine

## 2013-09-26 ENCOUNTER — Telehealth: Payer: Self-pay | Admitting: Family Medicine

## 2013-09-26 NOTE — Telephone Encounter (Signed)
LMOVM for pt to return call .Fleeger, Jessica Dawn  

## 2013-09-26 NOTE — Telephone Encounter (Signed)
Pt called and is unsure what if any appointment she might need. She said that her right hand sometimes goes numb. She feel like her bones hurt and the tips of her fingers feel numb. Please call to discuss. jw

## 2013-09-26 NOTE — Telephone Encounter (Signed)
Please have her come in to see me as soon as possible, if symptom worsens to go to the hospital.

## 2013-11-07 ENCOUNTER — Other Ambulatory Visit (HOSPITAL_COMMUNITY)
Admission: RE | Admit: 2013-11-07 | Discharge: 2013-11-07 | Disposition: A | Discharge status: Home / Self Care | Payer: Medicaid Other | Source: Ambulatory Visit | Attending: Family Medicine | Admitting: Family Medicine

## 2013-11-07 ENCOUNTER — Ambulatory Visit (INDEPENDENT_AMBULATORY_CARE_PROVIDER_SITE_OTHER): Payer: Medicaid Other | Admitting: Family Medicine

## 2013-11-07 ENCOUNTER — Encounter: Payer: Self-pay | Admitting: Family Medicine

## 2013-11-07 VITALS — BP 119/81 | HR 87 | Temp 98.2°F | Resp 18 | Wt 180.0 lb

## 2013-11-07 DIAGNOSIS — Z113 Encounter for screening for infections with a predominantly sexual mode of transmission: Secondary | ICD-10-CM | POA: Insufficient documentation

## 2013-11-07 DIAGNOSIS — Z7251 High risk heterosexual behavior: Secondary | ICD-10-CM

## 2013-11-07 DIAGNOSIS — N760 Acute vaginitis: Secondary | ICD-10-CM | POA: Diagnosis present

## 2013-11-07 LAB — POCT URINALYSIS DIPSTICK
Bilirubin, UA: NEGATIVE
Glucose, UA: 500
Ketones, UA: >=160
Leukocytes, UA: NEGATIVE
Nitrite, UA: NEGATIVE
Protein, UA: NEGATIVE
Spec Grav, UA: 1.015
Urobilinogen, UA: 2
pH, UA: 6

## 2013-11-07 LAB — POCT UA - MICROSCOPIC ONLY

## 2013-11-07 NOTE — Progress Notes (Signed)
    Subjective   Barbara Clark is a 38 y.o. female that presents for a same day visit  1. High risk sexual behavior: she had a sexual encounter on Saturday and the condom broke during coitus. The partner reported having penile discharge. She is asymptomatic but would like to be tested for any STD. She has a history of gonorrhea and chlamydia when she was 32-13 yo when she was pregnant with her first child. She denies any vaginal discharge, dysuria, abdominal pain or back pain. Does endorse some vaginal itching but this occurs around her cycle which she started on Sunday. She was negative for HIV and RPR in 2012. There was an oral sexual exposure during this event but she denies any sore throat or dysphagia.   Patient had discharge reporting from the [facility].   History  Substance Use Topics  . Smoking status: Current Some Day Smoker -- 1.00 packs/day    Types: Cigarettes  . Smokeless tobacco: Never Used     Comment: trying to quit  . Alcohol Use: Yes     Comment: rare but on occasion    ROS Per HPI  Objective   BP 119/81  Pulse 87  Temp(Src) 98.2 F (36.8 C) (Oral)  Resp 18  Wt 180 lb (81.647 kg)  SpO2 100%  LMP 11/07/2013  General: well appearing, NAD, alert, ambulating with a cane  HEENT: no LAD, oropharynx clear   Extremities: moves all freely Neuro: no gross deficits.   Assessment and Plan   Please refer to problem based charting of assessment and plan

## 2013-11-07 NOTE — Patient Instructions (Signed)
Thank you for coming in,   We will call you with results from today. If there is any need to treat then we will have you come back for antibiotics.   Please let me know if you have any symptoms arise.    Please feel free to call with any questions or concerns at any time, at [phone]. --Dr. Raeford Razor

## 2013-11-07 NOTE — Assessment & Plan Note (Signed)
Asymptomatic patient. Unknown STD status of partner with broken condom during coitus.  - UA: no sign of infection  - Urine ancillary cytology  - HIV/RPR  - throat cx gonorrhea/chlamydia

## 2013-11-08 ENCOUNTER — Telehealth: Payer: Self-pay | Admitting: Family Medicine

## 2013-11-08 ENCOUNTER — Telehealth: Payer: Self-pay | Admitting: *Deleted

## 2013-11-08 LAB — RPR

## 2013-11-08 LAB — HIV ANTIBODY (ROUTINE TESTING W REFLEX): HIV 1&2 Ab, 4th Generation: NONREACTIVE

## 2013-11-08 LAB — URINE CYTOLOGY ANCILLARY ONLY
Chlamydia: POSITIVE — AB
Neisseria Gonorrhea: NEGATIVE
Trichomonas: NEGATIVE

## 2013-11-08 NOTE — Telephone Encounter (Signed)
Spoke with patient and informed of below labs

## 2013-11-08 NOTE — Telephone Encounter (Signed)
[facility] customer service rep called about GC (throat) order from yesterday:   NO culture tube sent for throat GC order.  Test has been cancelled.   Unk Lightning, MLS

## 2013-11-08 NOTE — Telephone Encounter (Signed)
Message copied by Johny Shears on Wed Nov 08, 2013  1:52 PM ------      Message from: Clearance Coots E      Created: Wed Nov 08, 2013  1:20 PM       Please call patient and inform that her HIV and RPR are negative. Thank you. ------

## 2013-11-09 ENCOUNTER — Telehealth: Payer: Self-pay | Admitting: *Deleted

## 2013-11-09 ENCOUNTER — Ambulatory Visit (INDEPENDENT_AMBULATORY_CARE_PROVIDER_SITE_OTHER): Payer: Medicaid Other | Admitting: *Deleted

## 2013-11-09 DIAGNOSIS — A749 Chlamydial infection, unspecified: Secondary | ICD-10-CM

## 2013-11-09 MED ORDER — AZITHROMYCIN 250 MG PO TABS
1000.0000 mg | ORAL_TABLET | Freq: Once | ORAL | Status: AC
  Administered 2013-11-09: 1000 mg via ORAL

## 2013-11-09 NOTE — Telephone Encounter (Signed)
Patient scheduled for nurse visit for treatment today at 2pm

## 2013-11-09 NOTE — Telephone Encounter (Signed)
Patient notified and coming in today for treatment @ 2pm nurse visit

## 2013-11-09 NOTE — Telephone Encounter (Signed)
Message copied by Johny Shears on Thu Nov 09, 2013  1:43 PM ------      Message from: Clearance Coots E      Created: Thu Nov 09, 2013 10:09 AM       Please call patient and tell her that she is positive for chlamydia. She can schedule a nurse visit and be treated for this. Please let me know if she isn't able to come in and be treated. Please inform her that she needs to tell her partner. Thank you. ------

## 2013-11-09 NOTE — Telephone Encounter (Signed)
Message copied by Johny Shears on Thu Nov 09, 2013 12:30 PM ------      Message from: Clearance Coots E      Created: Thu Nov 09, 2013 10:09 AM       Please call patient and tell her that she is positive for chlamydia. She can schedule a nurse visit and be treated for this. Please let me know if she isn't able to come in and be treated. Please inform her that she needs to tell her partner. Thank you. ------

## 2013-11-09 NOTE — Progress Notes (Signed)
   Pt in nurse clinic for treatment of chlamydia.  Azithromycin 1 mg PO x 1 given per verbal order by Dr. Raeford Razor.  Pt advised not to have sex until partner has been tested/treated.  Pt stated understanding.  Derl Barrow, RN

## 2013-11-10 ENCOUNTER — Other Ambulatory Visit: Payer: Self-pay | Admitting: Family Medicine

## 2013-11-10 MED ORDER — INSULIN GLARGINE 100 UNIT/ML SOLOSTAR PEN: 35.0000 [IU] | PEN_INJECTOR | Freq: Every day | SUBCUTANEOUS | Status: DC

## 2013-11-11 LAB — CHLAMYDIA CULTURE

## 2014-01-24 ENCOUNTER — Other Ambulatory Visit: Payer: Self-pay | Admitting: Family Medicine

## 2014-01-24 ENCOUNTER — Telehealth: Payer: Self-pay | Admitting: Family Medicine

## 2014-01-24 MED ORDER — INSULIN ASPART 100 UNIT/ML ~~LOC~~ SOLN: 16.0000 [IU] | Freq: Three times a day (TID) | SUBCUTANEOUS | Status: DC

## 2014-01-24 NOTE — Telephone Encounter (Signed)
There seems to be some confusion about the dose of her Novolog. As of Aug 2015, she was on 16 unit TID which she claimed to be doing well on. We got another refill faxed in from her pharmacy requesting for Novolog 8 unit TID.  I called patient to clarify her current dose but her phone did not go through. I called the pharmacy and they stated the last refill from their pharmacy from 2014 was 8 unit, they weren't aware she is now up to 16 unit. Pharmacy advised to discuss with patient whenever she comes to pick up prescription and to call our office for further clarification.

## 2014-01-24 NOTE — Telephone Encounter (Signed)
Mother called for her daughter because her daughter's phone is off. Patient needs a refill on her Novolog. jw

## 2014-02-13 ENCOUNTER — Other Ambulatory Visit: Payer: Self-pay | Admitting: *Deleted

## 2014-02-13 MED ORDER — INSULIN ASPART 100 UNIT/ML ~~LOC~~ SOLN: 16.0000 [IU] | Freq: Three times a day (TID) | SUBCUTANEOUS | Status: DC

## 2014-02-20 ENCOUNTER — Other Ambulatory Visit: Payer: Self-pay | Admitting: *Deleted

## 2014-02-20 ENCOUNTER — Encounter: Payer: Self-pay | Admitting: Family Medicine

## 2014-02-20 ENCOUNTER — Ambulatory Visit (INDEPENDENT_AMBULATORY_CARE_PROVIDER_SITE_OTHER): Payer: Medicaid Other | Admitting: Family Medicine

## 2014-02-20 VITALS — BP 120/81 | HR 87 | Temp 99.0°F | Wt 176.1 lb

## 2014-02-20 DIAGNOSIS — F331 Major depressive disorder, recurrent, moderate: Secondary | ICD-10-CM

## 2014-02-20 DIAGNOSIS — R29898 Other symptoms and signs involving the musculoskeletal system: Secondary | ICD-10-CM

## 2014-02-20 DIAGNOSIS — M7989 Other specified soft tissue disorders: Secondary | ICD-10-CM

## 2014-02-20 DIAGNOSIS — Z23 Encounter for immunization: Secondary | ICD-10-CM

## 2014-02-20 DIAGNOSIS — M25561 Pain in right knee: Secondary | ICD-10-CM

## 2014-02-20 DIAGNOSIS — E108 Type 1 diabetes mellitus with unspecified complications: Secondary | ICD-10-CM

## 2014-02-20 HISTORY — DX: Other symptoms and signs involving the musculoskeletal system: R29.898

## 2014-02-20 LAB — COMPREHENSIVE METABOLIC PANEL
ALT: 17 U/L (ref 0–35)
AST: 13 U/L (ref 0–37)
Albumin: 3.9 g/dL (ref 3.5–5.2)
Alkaline Phosphatase: 113 U/L (ref 39–117)
BUN: 11 mg/dL (ref 6–23)
CO2: 27 mEq/L (ref 19–32)
Calcium: 9.4 mg/dL (ref 8.4–10.5)
Chloride: 105 mEq/L (ref 96–112)
Creat: 0.58 mg/dL (ref 0.50–1.10)
Glucose, Bld: 49 mg/dL — ABNORMAL LOW (ref 70–99)
Potassium: 4.3 mEq/L (ref 3.5–5.3)
Sodium: 139 mEq/L (ref 135–145)
Total Bilirubin: 0.2 mg/dL (ref 0.2–1.2)
Total Protein: 7 g/dL (ref 6.0–8.3)

## 2014-02-20 LAB — POCT GLYCOSYLATED HEMOGLOBIN (HGB A1C): Hemoglobin A1C: 9.1

## 2014-02-20 MED ORDER — INSULIN ASPART 100 UNIT/ML FLEXPEN: 18.0000 [IU] | PEN_INJECTOR | Freq: Three times a day (TID) | SUBCUTANEOUS | Status: DC

## 2014-02-20 NOTE — Telephone Encounter (Signed)
I placed this up front to be faxed today.

## 2014-02-20 NOTE — Assessment & Plan Note (Signed)
No acute change. COmplaint with her medication. F/U Psych as planned.

## 2014-02-20 NOTE — Assessment & Plan Note (Signed)
Not well controlled. A1C increased to 9.2 Plan to continue Lantus 45 unit Qd and to go up on Novolog to 18 unit TID. Continue home CGB check. F/U in 3 months for reassessment.

## 2014-02-20 NOTE — Assessment & Plan Note (Signed)
Right foot. Currently asymptomatic. Likely due to long term standing during the day. Recommending LL elevation. Monitor for now.

## 2014-02-20 NOTE — Progress Notes (Signed)
Subjective:     Patient ID: Barbara Clark, female   DOB: August 05, 1975, 40 y.o.   MRN: [id number]  HPI  DM1: Here for f/u, lately her CBG was getting high so she went up on her lantus from 35 unit daily to 45 unit daily which she used to be on in the past. She is currently on Novolog 16 unit TID, compliant with her medication. Her CBG is usually in the 200s. Right knee/Leg:Still with right knee and leg pain with weakness. She is getting exercise by walking with her HHA. She fell recently while in her bathroom in Jan. She continues to use her walking cane. Leg swelling:C/O right feet swelling on and off, only at night time, no pain associated with this. Depression: Currently on Zoloft and Barbara Clark has Psych follow up on the 29th of this months, denies suicidal thoughts, doing well in general on her medication.  Current Outpatient Prescriptions on File Prior to Visit  Medication Sig Dispense Refill  . insulin aspart (NOVOLOG) 100 UNIT/ML injection Inject 16 Units into the skin 3 (three) times daily with meals. 10 mL 3  . Insulin Glargine (LANTUS SOLOSTAR) 100 UNIT/ML Solostar Pen Inject 35 Units into the skin daily. (Patient taking differently: Inject 45 Units into the skin daily. ) 15 mL 3  . QUEtiapine (SEROQUEL) 200 MG tablet Take 300 mg by mouth at bedtime.     . sertraline (ZOLOFT) 25 MG tablet Take 50 mg by mouth daily.     . traMADol (ULTRAM) 50 MG tablet Take 1 tablet (50 mg total) by mouth every 8 (eight) hours as needed. 90 tablet 0  . glucose blood (ACCU-CHEK AVIVA PLUS) test strip Use to check capillary glucose three times daily. 100 each 11   No current facility-administered medications on file prior to visit.   Past Medical History  Diagnosis Date  . Diabetes mellitus     Type I  . Depression   . Anxiety   . HSV-2 (herpes simplex virus 2) infection 2012    serology      Review of Systems  Respiratory: Negative.   Cardiovascular: Negative.   Gastrointestinal: Negative.    Musculoskeletal: Positive for arthralgias.  Neurological: Positive for weakness.       Right leg weakness  Psychiatric/Behavioral: Negative for suicidal ideas and sleep disturbance. The patient is not nervous/anxious.   All other systems reviewed and are negative.      Filed Vitals:   02/20/14 1111  BP: 120/81  Pulse: 87  Temp: 99 F (37.2 C)  TempSrc: Oral  Weight: 176 lb 2 oz (79.89 kg)     Objective:   Physical Exam  Constitutional: She is oriented to person, place, and time. She appears well-developed. No distress.  Cardiovascular: Normal rate, regular rhythm, normal heart sounds and intact distal pulses.   No murmur heard. Pulmonary/Chest: Effort normal and breath sounds normal. No respiratory distress. She has no wheezes.  Abdominal: Soft. Bowel sounds are normal. She exhibits no distension and no mass. There is no tenderness.  Musculoskeletal: Normal range of motion. She exhibits no edema or tenderness.  Using walking cane for balance  Neurological: She is alert and oriented to person, place, and time. No cranial nerve deficit.  Psychiatric: She has a normal mood and affect. Her behavior is normal. Judgment and thought content normal. She expresses no homicidal and no suicidal ideation. She expresses no suicidal plans and no homicidal plans.  Nursing note and vitals reviewed.  Assessment:     DM1 LL weakness and pain Foot swelling: Right Depression     Plan:     Check problem list.

## 2014-02-20 NOTE — Assessment & Plan Note (Signed)
With pain. Continue current home exercise regimen. Continue current pain regimen. I referred to PT for strengthening exercise and further management.

## 2014-02-20 NOTE — Patient Instructions (Signed)
It was nice seeing you today, your A1C went up to 9.2, I will recommend continuing Lantus 45 unit daily and increase Novolog to 18 unit TID. Continue home glucose, call if less than 70.

## 2014-02-21 ENCOUNTER — Telehealth: Payer: Self-pay | Admitting: Family Medicine

## 2014-02-21 ENCOUNTER — Telehealth: Payer: Self-pay | Admitting: *Deleted

## 2014-02-21 NOTE — Telephone Encounter (Signed)
Appt made for 03-13-14

## 2014-02-21 NOTE — Telephone Encounter (Signed)
-----   Message from Andrena Mews, MD sent at 02/21/2014  9:42 AM EST ----- I asked her to return in 3 months yesterday prior to getting her result today. I need her to follow up sooner than 3 months due to  Hypoglycemia. Thanks.

## 2014-02-21 NOTE — Telephone Encounter (Signed)
I called to speak with patient about her serum glucose of 49. She was not having any hypoglycemic symptom at the clinic yesterday, she stated she used her insulin without eating before she came to the clinic yesterday. For now since she self increased her Lantus from 35 unit to 45 Unit I recommended she reduces her Lantus to 40 unit qd, continue Novolog 18 unit TID. Hold Novolog if CBG is less than 70. If CBG is consistently higher than 200 she can go back up to Lantus 45 unit. Patient to ensure compliance with diet and snacking.  Call if having any concern.  I will like to see her back in 1-2 wks. I will have our staff schedule follow up appointment.

## 2014-03-01 ENCOUNTER — Encounter: Payer: Self-pay | Admitting: Family Medicine

## 2014-03-01 NOTE — Progress Notes (Signed)
Letter placed up fron t for pick up. I spoke with patient to pick up letter soon.

## 2014-03-01 NOTE — Progress Notes (Signed)
Pt stopped by today checking on the status of her letter she usually gets out her leg.  She states that she mentioned it during her last visit and when you spoke about her results.  Please call patient when this letter is ready to be picked up.  States that the letter doesn't change just the dates on it. Barbara Clark,CMA

## 2014-03-05 ENCOUNTER — Ambulatory Visit: Payer: Medicaid Other | Admitting: Physical Therapy

## 2014-03-13 ENCOUNTER — Encounter: Payer: Self-pay | Admitting: Family Medicine

## 2014-03-13 ENCOUNTER — Ambulatory Visit (INDEPENDENT_AMBULATORY_CARE_PROVIDER_SITE_OTHER): Payer: Medicaid Other | Admitting: Family Medicine

## 2014-03-13 VITALS — BP 118/80 | HR 94 | Temp 98.8°F | Wt 176.0 lb

## 2014-03-13 DIAGNOSIS — E162 Hypoglycemia, unspecified: Secondary | ICD-10-CM

## 2014-03-13 DIAGNOSIS — E108 Type 1 diabetes mellitus with unspecified complications: Secondary | ICD-10-CM

## 2014-03-13 DIAGNOSIS — E1040 Type 1 diabetes mellitus with diabetic neuropathy, unspecified: Secondary | ICD-10-CM | POA: Insufficient documentation

## 2014-03-13 DIAGNOSIS — R29898 Other symptoms and signs involving the musculoskeletal system: Secondary | ICD-10-CM

## 2014-03-13 LAB — GLUCOSE, CAPILLARY: Glucose-Capillary: 123 mg/dL — ABNORMAL HIGH (ref 70–99)

## 2014-03-13 MED ORDER — GABAPENTIN 300 MG PO CAPS: 300.0000 mg | ORAL_CAPSULE | Freq: Two times a day (BID) | ORAL | Status: DC

## 2014-03-13 NOTE — Assessment & Plan Note (Signed)
Fall precaution. Continue use of walker for stability. F/U PT as scheduled in 2 days.

## 2014-03-13 NOTE — Assessment & Plan Note (Signed)
With sensory loss. Foot care instruction discussed. Start Gabapentin BID. F/U for reassessment in 4 wks.

## 2014-03-13 NOTE — Patient Instructions (Signed)
It was nice seeing you today, I am glad your glucose number has been stable. Please take Novolog 16 unit TID and Lantus 40 unit  Daily. Your feet exam shows that you have loss of sensation, likely due to DM. Please start gabapentin for pins and needle sensation. I will see you back in 4 wks,. Continue home glucose check. Diabetic Neuropathy Diabetic neuropathy is a nerve disease or nerve damage that is caused by diabetes mellitus. About half of all people with diabetes mellitus have some form of nerve damage. Nerve damage is more common in those who have had diabetes mellitus for many years and who generally have not had good control of their blood sugar (glucose) level. Diabetic neuropathy is a common complication of diabetes mellitus. There are three more common types of diabetic neuropathy and a fourth type that is less common and less understood:   Peripheral neuropathy--This is the most common type of diabetic neuropathy. It causes damage to the nerves of the feet and legs first and then eventually the hands and arms.The damage affects the ability to sense touch.  Autonomic neuropathy--This type causes damage to the autonomic nervous system, which controls the following functions:  Heartbeat.  Body temperature.  Blood pressure.  Urination.  Digestion.  Sweating.  Sexual function.  Focal neuropathy--Focal neuropathy can be painful and unpredictable and occurs most often in older adults with diabetes mellitus. It involves a specific nerve or one area and often comes on suddenly. It usually does not cause long-term problems.  Radiculoplexus neuropathy-- Sometimes called lumbosacral radiculoplexus neuropathy, radiculoplexus neuropathy affects the nerves of the thighs, hips, buttocks, or legs. It is more common in people with type 2 diabetes mellitus and in older men. It is characterized by debilitating pain, weakness, and atrophy, usually in the thigh muscles. CAUSES  The cause of  peripheral, autonomic, and focal neuropathies is diabetes mellitus that is uncontrolled and high glucose levels. The cause of radiculoplexus neuropathy is unknown. However, it is thought to be caused by inflammation related to uncontrolled glucose levels. SIGNS AND SYMPTOMS  Peripheral Neuropathy Peripheral neuropathy develops slowly over time. When the nerves of the feet and legs no longer work there may be:   Burning, stabbing, or aching pain in the legs or feet.  Inability to feel pressure or pain in your feet. This can lead to:  Thick calluses over pressure areas.  Pressure sores.  Ulcers.  Foot deformities.  Reduced ability to feel temperature changes.  Muscle weakness. Autonomic Neuropathy The symptoms of autonomic neuropathy vary depending on which nerves are affected. Symptoms may include:  Problems with digestion, such as:  Feeling sick to your stomach (nausea).  Vomiting.  Bloating.  Constipation.  Diarrhea.  Abdominal pain.  Difficulty with urination. This occurs if you lose your ability to sense when your bladder is full. Problems include:  Urine leakage (incontinence).  Inability to empty your bladder completely (retention).  Rapid or irregular heartbeat (palpitations).  Blood pressure drops when you stand up (orthostatic hypotension). When you stand up you may feel:  Dizzy.  Weak.  Faint.  In men, inability to attain and maintain an erection.  In women, vaginal dryness and problems with decreased sexual desire and arousal.  Problems with body temperature regulation.  Increased or decreased sweating. Focal Neuropathy  Abnormal eye movements or abnormal alignment of both eyes.  Weakness in the wrist.  Foot drop. This results in an inability to lift the foot properly and abnormal walking or foot movement.  Paralysis on one side of your face (Bell palsy).  Chest or abdominal pain. Radiculoplexus Neuropathy  Sudden, severe pain in your  hip, thigh, or buttocks.  Weakness and wasting of thigh muscles.  Difficulty rising from a seated position.  Abdominal swelling.  Unexplained weight loss (usually more than 10 lb [4.5 kg]). DIAGNOSIS  Peripheral Neuropathy Your senses may be tested. Sensory function testing can be done with:  A light touch using a monofilament.  A vibration with tuning fork.  A sharp sensation with a pin prick. Other tests that can help diagnose neuropathy are:  Nerve conduction velocity. This test checks the transmission of an electrical current through a nerve.  Electromyography. This shows how muscles respond to electrical signals transmitted by nearby nerves.  Quantitative sensory testing. This is used to assess how your nerves respond to vibrations and changes in temperature. Autonomic Neuropathy Diagnosis is often based on reported symptoms. Tell your health care provider if you experience:   Dizziness.   Constipation.   Diarrhea.   Inappropriate urination or inability to urinate.   Inability to get or maintain an erection.  Tests that may be done include:   Electrocardiography or Holter monitor. These are tests that can help show problems with the heart rate or heart rhythm.   An X-ray exam may be done. Focal Neuropathy Diagnosis is made based on your symptoms and what your health care provider finds during your exam. Other tests may be done. They may include:  Nerve conduction velocities. This checks the transmission of electrical current through a nerve.  Electromyography. This shows how muscles respond to electrical signals transmitted by nearby nerves.  Quantitative sensory testing. This test is used to assess how your nerves respond to vibration and changes in temperature. Radiculoplexus Neuropathy  Often the first thing is to eliminate any other issue or problems that might be the cause, as there is no stick test for diagnosis.  X-ray exam of your spine and  lumbar region.  Spinal tap to rule out cancer.  MRI to rule out other lesions. TREATMENT  Once nerve damage occurs, it cannot be reversed. The goal of treatment is to keep the disease or nerve damage from getting worse and affecting more nerve fibers. Controlling your blood glucose level is the key. Most people with radiculoplexus neuropathy see at least a partial improvement over time. You will need to keep your blood glucose and HbA1c levels in the target range determined by your health care provider. Things that help control blood glucose levels include:   Blood glucose monitoring.   Meal planning.   Physical activity.   Diabetes medicine.  Over time, maintaining lower blood glucose levels helps lessen symptoms. Sometimes, prescription pain medicine is needed. HOME CARE INSTRUCTIONS:  Do not smoke.  Keep your blood glucose level in the range that you and your health care provider have determined acceptable for you.  Keep your blood pressure level in the range that you and your health care provider have determined acceptable for you.  Eat a well-balanced diet.  Be active every day.  Check your feet every day. SEEK MEDICAL CARE IF:   You have burning, stabbing, or aching pain in the legs or feet.  You are unable to feel pressure or pain in your feet.  You develop problems with digestion such as:  Nausea.  Vomiting.  Bloating.  Constipation.  Diarrhea.  Abdominal pain.  You have difficulty with urination, such as:  Incontinence.  Retention.  You have  palpitations.  You develop orthostatic hypotension. When you stand up you may feel:  Dizzy.  Weak.  Faint.  You cannot attain and maintain an erection (in men).  You have vaginal dryness and problems with decreased sexual desire and arousal (in women).  You have severe pain in your thighs, legs, or buttocks.  You have unexplained weight loss. Document Released: 03/16/2001 Document Revised:  10/26/2012 Document Reviewed: 06/16/2012 [facility] Patient Information 2015 [place], [place]. This information is not intended to replace advice given to you by your health care provider. Make sure you discuss any questions you have with your health care provider.

## 2014-03-13 NOTE — Progress Notes (Signed)
Subjective:     Patient ID: Barbara Clark, female   DOB: [birth year], 39 y.o.   MRN: [id number]  HPI  DM1:Here for follow up from last visit where her serum glucose was 49, she has not had any hypoglycemic episode at home, she reduced Novolog to 16 unit from 18 tid and Lantus to 35 from 45 although I had instructed she reduces it to 40. Her home CBG has been mostly in the 200s, maximum 280 min 79. Denies any concern today. Leg pain:C/O pins and needle sensation of her LL B/L but worse on the right associated with leg swelling at night,she also feels numb on her right foot. She stated she was seen by another provider recently who informed her she had lost sensation to her right foot. No recent feet trauma or ulceration. LL weakness: Still feeling weak in her legs with gait problem, denies recent fall, she started using walker few days ago to prevent fall. She has an appointment with PT in 2 days.  Current Outpatient Prescriptions on File Prior to Visit  Medication Sig Dispense Refill  . insulin aspart (NOVOLOG) 100 UNIT/ML FlexPen Inject 18 Units into the skin 3 (three) times daily with meals. (Patient taking differently: Inject 16 Units into the skin 3 (three) times daily with meals. ) 15 mL 4  . glucose blood (ACCU-CHEK AVIVA PLUS) test strip Use to check capillary glucose three times daily. 100 each 11  . Insulin Glargine (LANTUS SOLOSTAR) 100 UNIT/ML Solostar Pen Inject 35 Units into the skin daily. (Patient taking differently: Inject 45 Units into the skin daily. ) 15 mL 3  . QUEtiapine (SEROQUEL) 200 MG tablet Take 300 mg by mouth at bedtime.     . sertraline (ZOLOFT) 25 MG tablet Take 50 mg by mouth daily.     . traMADol (ULTRAM) 50 MG tablet Take 1 tablet (50 mg total) by mouth every 8 (eight) hours as needed. 90 tablet 0   No current facility-administered medications on file prior to visit.   Past Medical History  Diagnosis Date  . Diabetes mellitus     Type I  . Depression   . Anxiety    . HSV-2 (herpes simplex virus 2) infection 2012    serology      Review of Systems  Respiratory: Negative.   Cardiovascular: Negative.   Gastrointestinal: Negative.   Neurological: Positive for numbness.       Nerve pain  All other systems reviewed and are negative.  Filed Vitals:   03/13/14 1154  BP: 118/80  Pulse: 94  Temp: 98.8 F (37.1 C)  TempSrc: Oral  Weight: 176 lb (79.833 kg)       Objective:   Physical Exam  Constitutional: She is oriented to person, place, and time. She appears well-developed and well-nourished. No distress.  Using walker for support.  Cardiovascular: Normal rate, regular rhythm, normal heart sounds and intact distal pulses.   No murmur heard. Pulses:      Dorsalis pedis pulses are 2+ on the right side, and 2+ on the left side.  Pulmonary/Chest: Effort normal and breath sounds normal. No respiratory distress. She has no wheezes.  Abdominal: Soft. Bowel sounds are normal. She exhibits no distension and no mass. There is no tenderness.  Musculoskeletal: Normal range of motion. She exhibits no edema.  Neurological: She is alert and oriented to person, place, and time. A sensory deficit is present.  Reduced sensation of left feet, loss of sensation of right feet.  Nursing  note and vitals reviewed.      Assessment:     DM1 DM neuropathy LL weakness     Plan:     Check problem list.

## 2014-03-13 NOTE — Assessment & Plan Note (Signed)
CBG today at the clinic was 123. Plan to continue Novolog 16 unit TID. May take lantus 40 unit daily due to elevated A1C of 9+ Advised to continue home CBG check, if less than 60-70 to hold insulin and let me know. She verbalized understanding and agreed with plan.

## 2014-03-15 ENCOUNTER — Ambulatory Visit: Payer: Medicaid Other | Attending: Family Medicine | Admitting: Physical Therapy

## 2014-03-15 DIAGNOSIS — M25561 Pain in right knee: Secondary | ICD-10-CM | POA: Diagnosis not present

## 2014-03-15 DIAGNOSIS — E1040 Type 1 diabetes mellitus with diabetic neuropathy, unspecified: Secondary | ICD-10-CM | POA: Insufficient documentation

## 2014-03-15 DIAGNOSIS — M25562 Pain in left knee: Secondary | ICD-10-CM | POA: Diagnosis not present

## 2014-03-15 DIAGNOSIS — R29898 Other symptoms and signs involving the musculoskeletal system: Secondary | ICD-10-CM | POA: Insufficient documentation

## 2014-03-15 DIAGNOSIS — M7989 Other specified soft tissue disorders: Secondary | ICD-10-CM | POA: Diagnosis not present

## 2014-03-15 NOTE — Patient Instructions (Addendum)
Sit to stand from high surface, stacks of pillows 5x with min use of LEs.  Supine abdominal brace with 5 sec hold 5-10 x.  Reviewed basic LE theraband exercise previously given by PT last year, suggested seated hip abd/ER with band around thighs 10x.  Discussed community based exercise options.  Patient previously did aquatic ex for her back 2 years ago and is considering again (patient states her aide would go with her)     Abduction: Clam (Eccentric) - Side-Lying   Lie on side with knees bent. Lift top knee, keeping feet together. Keep trunk steady. Slowly lower for 3-5 seconds. _10__ reps per set, __1_ sets per day, __7_ days per week. Add ___ lbs when you achieve ___ repetitions.  Copyright  VHI. All rights reserved.
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
## 2014-04-16 ENCOUNTER — Other Ambulatory Visit: Payer: Self-pay | Admitting: *Deleted

## 2014-04-16 MED ORDER — GABAPENTIN 300 MG PO CAPS: 300.0000 mg | ORAL_CAPSULE | Freq: Two times a day (BID) | ORAL | Status: DC

## 2014-04-27 ENCOUNTER — Ambulatory Visit (INDEPENDENT_AMBULATORY_CARE_PROVIDER_SITE_OTHER): Payer: Medicaid Other | Admitting: Family Medicine

## 2014-04-27 ENCOUNTER — Encounter: Payer: Self-pay | Admitting: Family Medicine

## 2014-04-27 VITALS — BP 121/78 | HR 101 | Temp 98.7°F | Ht 67.0 in | Wt 181.4 lb

## 2014-04-27 DIAGNOSIS — M79606 Pain in leg, unspecified: Secondary | ICD-10-CM

## 2014-04-27 DIAGNOSIS — G8929 Other chronic pain: Secondary | ICD-10-CM

## 2014-04-27 DIAGNOSIS — F331 Major depressive disorder, recurrent, moderate: Secondary | ICD-10-CM | POA: Diagnosis not present

## 2014-04-27 DIAGNOSIS — E108 Type 1 diabetes mellitus with unspecified complications: Secondary | ICD-10-CM | POA: Diagnosis not present

## 2014-04-27 DIAGNOSIS — Z720 Tobacco use: Secondary | ICD-10-CM

## 2014-04-27 DIAGNOSIS — N920 Excessive and frequent menstruation with regular cycle: Secondary | ICD-10-CM

## 2014-04-27 DIAGNOSIS — F17200 Nicotine dependence, unspecified, uncomplicated: Secondary | ICD-10-CM

## 2014-04-27 DIAGNOSIS — M25569 Pain in unspecified knee: Secondary | ICD-10-CM | POA: Insufficient documentation

## 2014-04-27 HISTORY — DX: Pain in leg, unspecified: M79.606

## 2014-04-27 HISTORY — DX: Other chronic pain: G89.29

## 2014-04-27 HISTORY — DX: Excessive and frequent menstruation with regular cycle: N92.0

## 2014-04-27 MED ORDER — TRAMADOL HCL 50 MG PO TABS: 50.0000 mg | ORAL_TABLET | Freq: Three times a day (TID) | ORAL | Status: DC | PRN

## 2014-04-27 MED ORDER — NICOTINE 14 MG/24HR TD PT24: 14.0000 mg | MEDICATED_PATCH | Freq: Every day | TRANSDERMAL | Status: DC

## 2014-04-27 NOTE — Assessment & Plan Note (Signed)
Bilateral but more on left. Associated with LL weakness. She had PT recently. I refilled her Tramadol today.

## 2014-04-27 NOTE — Assessment & Plan Note (Signed)
Etiology unclear. Pelvic U/S ordered to r/o fibroid or other endometrial problems.

## 2014-04-27 NOTE — Progress Notes (Signed)
Subjective:     Patient ID: Barbara Clark, female   DOB: [birth year], 39 y.o.   MRN: [id number]  HPI  [id number] with her medications, here for follow up. Denies hypoglycemic episodes. Irregular menses: heavy period for 3 months. Period last for 1 wks, the first 4 days she has heavy periods with clots,this makes her weak and tired. There is associated cramping real bad for 2 days. Tramadol helps with her pain. Not on birth control, she had tubal ligation. LMP is current started 04/24/14. Headache:  Depression:She is doing well and compliant with psych follow up. Last seen by her psychiatrist 04/18/14. Smoking: Patient has been smoking since 2004,she normally smokes about 1 PPD but has been slowly cutting back, she now takes less than 10 sticks per day.She will like to try Nicotine patch. LL weakness/Pain: Patient need refill of her tramadol. She had PT one time which helped some. Now she is doing exercise at home.   Current Outpatient Prescriptions on File Prior to Visit  Medication Sig Dispense Refill  . gabapentin (NEURONTIN) 300 MG capsule Take 1 capsule (300 mg total) by mouth 2 (two) times daily. Start with 1 tablet daily. 60 capsule 1  . insulin aspart (NOVOLOG) 100 UNIT/ML FlexPen Inject 18 Units into the skin 3 (three) times daily with meals. (Patient taking differently: Inject 16 Units into the skin 3 (three) times daily with meals. ) 15 mL 4  . Insulin Glargine (LANTUS SOLOSTAR) 100 UNIT/ML Solostar Pen Inject 35 Units into the skin daily. (Patient taking differently: Inject 45 Units into the skin daily. ) 15 mL 3  . QUEtiapine (SEROQUEL) 200 MG tablet Take 300 mg by mouth at bedtime.     . sertraline (ZOLOFT) 25 MG tablet Take 50 mg by mouth daily.     Marland Kitchen glucose blood (ACCU-CHEK AVIVA PLUS) test strip Use to check capillary glucose three times daily. 100 each 11  . traMADol (ULTRAM) 50 MG tablet Take 1 tablet (50 mg total) by mouth every 8 (eight) hours as needed. (Patient not taking:  Reported on 04/27/2014) 90 tablet 0   No current facility-administered medications on file prior to visit.   Past Medical History  Diagnosis Date  . Diabetes mellitus     Type I  . Depression   . Anxiety   . HSV-2 (herpes simplex virus 2) infection 2012    serology      Review of Systems  Respiratory: Negative.   Cardiovascular: Negative.   Gastrointestinal: Negative.   Genitourinary: Positive for menstrual problem.  Musculoskeletal: Positive for arthralgias and gait problem.  All other systems reviewed and are negative.  Filed Vitals:   04/27/14 1131  BP: 121/78  Pulse: 101  Temp: 98.7 F (37.1 C)  TempSrc: Oral  Height: 5\' 7"  (1.702 m)  Weight: 181 lb 6 oz (82.271 kg)       Objective:   Physical Exam  Constitutional: She appears well-developed. No distress.  Cardiovascular: Normal rate, regular rhythm and normal heart sounds.   No murmur heard. Pulmonary/Chest: Effort normal and breath sounds normal. No respiratory distress. She has no wheezes.  Abdominal: Soft. Bowel sounds are normal. She exhibits no distension and no mass. There is no tenderness.  Musculoskeletal: Normal range of motion. She exhibits no edema.  Stabilizing with cane.  Psychiatric: Her behavior is normal.  Mood down.  Nursing note and vitals reviewed.      Assessment:     DM1 Menorrhagia Depressed mood Smoking  LL weakness and pain.  Plan:     Check problem list.

## 2014-04-27 NOTE — Assessment & Plan Note (Signed)
Counseling done on cessation. I prescribed Nicotine patch for her.

## 2014-04-27 NOTE — Assessment & Plan Note (Signed)
She is compliant with management. Follow up psych as planned.

## 2014-04-27 NOTE — Patient Instructions (Signed)
Menorrhagia Menorrhagia is when your menstrual periods are heavy or last longer than usual.  HOME CARE  Only take medicine as told by your doctor.  Take any iron pills as told by your doctor. Heavy bleeding may cause low levels of iron in your body.  Do not take aspirin 1 week before or during your period. Aspirin can make the bleeding worse.  Lie down for a while if you change your tampon or pad more than once in 2 hours. This may help lessen the bleeding.  Eat a healthy diet and foods with iron. These foods include leafy green vegetables, meat, liver, eggs, and whole grain breads and cereals.  Do not try to lose weight. Wait until the heavy bleeding has stopped and your iron level is normal. GET HELP IF:  You soak through a pad or tampon every 1 or 2 hours, and this happens every time you have a period.  You need to use pads and tampons at the same time because you are bleeding so much.  You need to change your pad or tampon during the night.  You have a period that lasts for more than 8 days.  You pass clots bigger than 1 inch (2.5 cm) wide.  You have irregular periods that happen more or less often than once a month.  You feel dizzy or pass out (faint).  You feel very weak or tired.  You feel short of breath or feel your heart is beating too fast when you exercise.  You feel sick to your stomach (nausea) and you throw up (vomit) while you are taking your medicine.   You have watery poop (diarrhea) while you are taking your medicine.  You have any problems that may be related to the medicine you are taking.  GET HELP RIGHT AWAY IF:  You soak through 4 or more pads or tampons in 2 hours.  You have any bleeding while you are pregnant. MAKE SURE YOU:   Understand these instructions.  Will watch your condition.  Will get help right away if you are not doing well or get worse. Document Released: 10/15/2007 Document Revised: 09/07/2012 Document Reviewed:  07/07/2012 [facility] Patient Information 2015 [place], [place]. This information is not intended to replace advice given to you by your health care provider. Make sure you discuss any questions you have with your health care provider.

## 2014-04-27 NOTE — Assessment & Plan Note (Signed)
Stable on current regimen. RTC in 6 wks for A1C.

## 2014-05-04 ENCOUNTER — Ambulatory Visit (HOSPITAL_COMMUNITY)
Admission: RE | Admit: 2014-05-04 | Discharge: 2014-05-04 | Disposition: A | Discharge status: Home / Self Care | Payer: Medicaid Other | Source: Ambulatory Visit | Attending: Family Medicine | Admitting: Family Medicine

## 2014-05-04 DIAGNOSIS — N920 Excessive and frequent menstruation with regular cycle: Secondary | ICD-10-CM | POA: Insufficient documentation

## 2014-05-07 ENCOUNTER — Telehealth: Payer: Self-pay | Admitting: Family Medicine

## 2014-05-07 ENCOUNTER — Encounter: Payer: Self-pay | Admitting: Family Medicine

## 2014-05-07 DIAGNOSIS — D259 Leiomyoma of uterus, unspecified: Secondary | ICD-10-CM

## 2014-05-07 DIAGNOSIS — N920 Excessive and frequent menstruation with regular cycle: Secondary | ICD-10-CM

## 2014-05-07 NOTE — Telephone Encounter (Signed)
Pelvic U/S report discussed with patient. She has uterine fibroid. She is still having heavy bleeding.  Treatment recommended include medication/Surgery. She agrees with Gyn referral to further discussed this. I referred her to [place].

## 2014-05-14 ENCOUNTER — Encounter: Payer: Self-pay | Admitting: Obstetrics & Gynecology

## 2014-06-11 ENCOUNTER — Encounter: Payer: Self-pay | Admitting: Obstetrics & Gynecology

## 2014-06-11 ENCOUNTER — Ambulatory Visit (INDEPENDENT_AMBULATORY_CARE_PROVIDER_SITE_OTHER): Payer: Medicaid Other | Admitting: Obstetrics & Gynecology

## 2014-06-11 VITALS — BP 112/98 | HR 101 | Wt 183.5 lb

## 2014-06-11 DIAGNOSIS — Z720 Tobacco use: Secondary | ICD-10-CM | POA: Diagnosis not present

## 2014-06-11 DIAGNOSIS — N939 Abnormal uterine and vaginal bleeding, unspecified: Secondary | ICD-10-CM

## 2014-06-11 LAB — CBC
HCT: 37.3 % (ref 36.0–46.0)
Hemoglobin: 12.3 g/dL (ref 12.0–15.0)
MCH: 29.7 pg (ref 26.0–34.0)
MCHC: 33 g/dL (ref 30.0–36.0)
MCV: 90.1 fL (ref 78.0–100.0)
MPV: 9.8 fL (ref 8.6–12.4)
Platelets: 485 10*3/uL — ABNORMAL HIGH (ref 150–400)
RBC: 4.14 MIL/uL (ref 3.87–5.11)
RDW: 13.7 % (ref 11.5–15.5)
WBC: 9.6 10*3/uL (ref 4.0–10.5)

## 2014-06-11 LAB — POCT PREGNANCY, URINE: Preg Test, Ur: NEGATIVE

## 2014-06-11 MED ORDER — LEVONORGESTREL 20 MCG/24HR IU IUD
INTRAUTERINE_SYSTEM | Freq: Once | INTRAUTERINE | Status: AC
  Administered 2014-06-11: 1 via INTRAUTERINE

## 2014-06-11 NOTE — Progress Notes (Signed)
Patient ID: Barbara Clark, female   DOB: [birth year], 39 y.o.   MRN: [id number] CLINIC ENCOUNTER NOTE  History:  39 y.o. [id number] here today for eval of heavy menses.  Pt reports cycles monthly.  She reports 7 day cycles.  She reports that her cycles are painful and assoc with heavy bleeding and cramping. She also c/o nausea with her cycles. She denies dizziness or SOB.    Past Medical History  Diagnosis Date  . Diabetes mellitus     Type I  . Depression   . Anxiety   . HSV-2 (herpes simplex virus 2) infection 2012    serology    Past Surgical History  Procedure Laterality Date  . Tubal ligation      The following portions of the patient's history were reviewed and updated as appropriate: allergies, current medications, past family history, past medical history, past social history, past surgical history and problem list.   Health Maintenance:  Normal pap in EPIC for 2012.  Pt reports PAP this year.    Review of Systems:   Comprehensive review of systems was otherwise negative.  Objective:  Physical Exam BP 112/98 mmHg  Pulse 101  Wt 183 lb 8 oz (83.235 kg)  LMP 05/28/2014 CONSTITUTIONAL: Well-developed, well-nourished female in no acute distress.  HENT:  Normocephalic, atraumatic, External right and left ear normal. Oropharynx is clear and moist EYES: Conjunctivae and EOM are normal. Pupils are equal, round, and reactive to light. No scleral icterus.  NECK: Normal range of motion, supple, no masses SKIN: Skin is warm and dry. No rash noted. Not diaphoretic. No erythema. No pallor. [place]: Alert and oriented to person, place, and time. Normal reflexes, muscle tone coordination. No cranial nerve deficit noted. PSYCHIATRIC: Normal mood and affect. Normal behavior. Normal judgment and thought content. ABDOMEN: Soft, no distention noted.  No tenderness, rebound or guarding.  PELVIC: Normal appearing external genitalia; normal appearing vaginal mucosa and cervix.  Normal  appearing discharge.  Normal uterine size, no other palpable masses, no uterine or adnexal tenderness. MUSCULOSKELETAL: Normal range of motion. No edema and no tenderness.  GYNECOLOGY CLINIC PROCEDURE NOTE   IUD Insertion Procedure Note Patient identified, informed consent performed.  Discussed risks of irregular bleeding, cramping, infection, malpositioning or misplacement of the IUD outside the uterus which may require further procedures. Time out was performed.  Urine pregnancy test negative.  Speculum placed in the vagina.  Cervix visualized.  Cleaned with Betadine x 2.  Grasped anteriorly with a single tooth tenaculum.  Uterus sounded to 10 cm.  Mirena IUD placed per manufacturer's recommendations.  Strings trimmed to 3 cm. Tenaculum was removed, good hemostasis noted.  Patient tolerated procedure well.    Labs and Imaging 05/04/2014 CLINICAL DATA: Menorrhagia with regular cycle  EXAM: TRANSABDOMINAL AND TRANSVAGINAL ULTRASOUND OF PELVIS  TECHNIQUE: Both transabdominal and transvaginal ultrasound examinations of the pelvis were performed. Transabdominal technique was performed for global imaging of the pelvis including uterus, ovaries, adnexal regions, and pelvic cul-de-sac. It was necessary to proceed with endovaginal exam following the transabdominal exam to visualize the endometrium and ovaries.  COMPARISON: None  FINDINGS: Uterus  Measurements: 11.5 x 5.7 x 5.5 cm. 3.8 cm posterior fibroid. 17 mm fundal fibroid. No subserosal fibroid identified.  Endometrium  Thickness: 6 mm. No focal abnormality visualized.  Right ovary  Measurements: 3.0 x 2.4 x 2.3 cm. Normal appearance/no adnexal mass.  Left ovary  Measurements: 2.6 x 2.3 x 2.1 cm. Normal appearance/no adnexal mass.  Other findings  No free fluid.  IMPRESSION: Uterine fibroids. Normal endometrium. Normal adnexa.    Assessment & Plan:  AUB- pt has small uterine fibroids that  are unlikely the cause of all of her sx.  She has a significant amount of pain with her cycles as well.  She could also have endometriosis.  D/w her the benefits of a LnIUD of treating all of her sx. Reviewed the side effects.  She wished to proceed.     IUD placed today Patient was given post-procedure instructions.  Follow up in 4 weeks for IUD check. Rec tob cessation Routine preventative health maintenance measures emphasized.  Total face-to-face time with patient: 30 minutes. Over 50% of encounter was spent on counseling and coordination of care.   Shalina Norfolk L. Ihor Dow, MD, [place] Attending [place] for [employer], [place]

## 2014-06-11 NOTE — Patient Instructions (Signed)
Levonorgestrel intrauterine device (IUD) What is this medicine? LEVONORGESTREL IUD (LEE voe nor jes trel) is a contraceptive (birth control) device. The device is placed inside the uterus by a healthcare professional. It is used to prevent pregnancy and can also be used to treat heavy bleeding that occurs during your period. Depending on the device, it can be used for 3 to 5 years. This medicine may be used for other purposes; ask your health care provider or pharmacist if you have questions. COMMON BRAND NAME(S): LILETTA, Mirena, Skyla What should I tell my health care provider before I take this medicine? They need to know if you have any of these conditions: -abnormal Pap smear -cancer of the breast, uterus, or cervix -diabetes -endometritis -genital or pelvic infection now or in the past -have more than one sexual partner or your partner has more than one partner -heart disease -history of an ectopic or tubal pregnancy -immune system problems -IUD in place -liver disease or tumor -problems with blood clots or take blood-thinners -use intravenous drugs -uterus of unusual shape -vaginal bleeding that has not been explained -an unusual or allergic reaction to levonorgestrel, other hormones, silicone, or polyethylene, medicines, foods, dyes, or preservatives -pregnant or trying to get pregnant -breast-feeding How should I use this medicine? This device is placed inside the uterus by a health care professional. Talk to your pediatrician regarding the use of this medicine in children. Special care may be needed. Overdosage: If you think you have taken too much of this medicine contact a poison control center or emergency room at once. NOTE: This medicine is only for you. Do not share this medicine with others. What if I miss a dose? This does not apply. What may interact with this medicine? Do not take this medicine with any of the following  medications: -amprenavir -bosentan -fosamprenavir This medicine may also interact with the following medications: -aprepitant -barbiturate medicines for inducing sleep or treating seizures -bexarotene -griseofulvin -medicines to treat seizures like carbamazepine, ethotoin, felbamate, oxcarbazepine, phenytoin, topiramate -modafinil -pioglitazone -rifabutin -rifampin -rifapentine -some medicines to treat HIV infection like atazanavir, indinavir, lopinavir, nelfinavir, tipranavir, ritonavir -St. John's wort -warfarin This list may not describe all possible interactions. Give your health care provider a list of all the medicines, herbs, non-prescription drugs, or dietary supplements you use. Also tell them if you smoke, drink alcohol, or use illegal drugs. Some items may interact with your medicine. What should I watch for while using this medicine? Visit your doctor or health care professional for regular check ups. See your doctor if you or your partner has sexual contact with others, becomes HIV positive, or gets a sexual transmitted disease. This product does not protect you against HIV infection (AIDS) or other sexually transmitted diseases. You can check the placement of the IUD yourself by reaching up to the top of your vagina with clean fingers to feel the threads. Do not pull on the threads. It is a good habit to check placement after each menstrual period. Call your doctor right away if you feel more of the IUD than just the threads or if you cannot feel the threads at all. The IUD may come out by itself. You may become pregnant if the device comes out. If you notice that the IUD has come out use a backup birth control method like condoms and call your health care provider. Using tampons will not change the position of the IUD and are okay to use during your period. What side effects may   I notice from receiving this medicine? Side effects that you should report to your doctor or  health care professional as soon as possible: -allergic reactions like skin rash, itching or hives, swelling of the face, lips, or tongue -fever, flu-like symptoms -genital sores -high blood pressure -no menstrual period for 6 weeks during use -pain, swelling, warmth in the leg -pelvic pain or tenderness -severe or sudden headache -signs of pregnancy -stomach cramping -sudden shortness of breath -trouble with balance, talking, or walking -unusual vaginal bleeding, discharge -yellowing of the eyes or skin Side effects that usually do not require medical attention (report to your doctor or health care professional if they continue or are bothersome): -acne -breast pain -change in sex drive or performance -changes in weight -cramping, dizziness, or faintness while the device is being inserted -headache -irregular menstrual bleeding within first 3 to 6 months of use -nausea This list may not describe all possible side effects. Call your doctor for medical advice about side effects. You may report side effects to FDA at 1-800-FDA-1088. Where should I keep my medicine? This does not apply. NOTE: This sheet is a summary. It may not cover all possible information. If you have questions about this medicine, talk to your doctor, pharmacist, or health care provider.  2015, [employer] Standard. (2011-02-05 13:54:04)  

## 2014-07-03 ENCOUNTER — Encounter: Payer: Self-pay | Admitting: General Practice

## 2014-07-11 ENCOUNTER — Other Ambulatory Visit: Payer: Self-pay | Admitting: *Deleted

## 2014-07-11 MED ORDER — INSULIN ASPART 100 UNIT/ML FLEXPEN: 16.0000 [IU] | PEN_INJECTOR | Freq: Three times a day (TID) | SUBCUTANEOUS | Status: DC

## 2014-07-16 ENCOUNTER — Ambulatory Visit: Payer: Medicaid Other | Admitting: Obstetrics & Gynecology

## 2014-08-13 ENCOUNTER — Encounter (INDEPENDENT_AMBULATORY_CARE_PROVIDER_SITE_OTHER): Payer: Medicaid Other | Admitting: Obstetrics & Gynecology

## 2014-08-14 ENCOUNTER — Ambulatory Visit: Payer: Self-pay | Admitting: Family Medicine

## 2014-08-16 ENCOUNTER — Encounter: Payer: Self-pay | Admitting: Obstetrics & Gynecology

## 2014-08-20 NOTE — Progress Notes (Signed)
This encounter was created in error - please disregard.

## 2014-08-22 ENCOUNTER — Ambulatory Visit (INDEPENDENT_AMBULATORY_CARE_PROVIDER_SITE_OTHER): Payer: Medicaid Other | Admitting: Obstetrics & Gynecology

## 2014-08-22 ENCOUNTER — Encounter: Payer: Self-pay | Admitting: Obstetrics & Gynecology

## 2014-08-22 VITALS — BP 123/85 | HR 107 | Temp 98.4°F | Ht 66.0 in | Wt 184.0 lb

## 2014-08-22 DIAGNOSIS — Z30431 Encounter for routine checking of intrauterine contraceptive device: Secondary | ICD-10-CM | POA: Diagnosis not present

## 2014-08-22 NOTE — Progress Notes (Signed)
Patient ID: Barbara Clark, female   DOB: April 17, 1975, 39 y.o.   MRN: [id number] History:  39 y.o. G3P3003 here today for today for IUD string check; Mirena IUD was placed  06/11/2014. No complaints about the Mirena, she does report irreg cycles but, her cramping and bloating has resolved.  The following portions of the patient's history were reviewed and updated as appropriate: allergies, current medications, past family history, past medical history, past social history, past surgical history and problem list. Last pap smear on 2012 was normal, Review of Systems:  Pertinent items are noted in HPI.  Objective:  Physical Exam Blood pressure 123/85, pulse 107, temperature 98.4 F (36.9 C), temperature source Oral, height 5\' 6"  (1.676 m), weight 184 lb (83.462 kg), last menstrual period 08/13/2014. Gen: NAD Abd: Soft, nontender and nondistended Pelvic: Normal appearing external genitalia; normal appearing vaginal mucosa and cervix.  IUD strings visualized, about 3 cm in length outside cervix.   Assessment & Plan:  Normal IUD check. Patient to keep IUD in place for five years; can come in for removal if she desires pregnancy within the next five years. Routine preventative health maintenance measures emphasized. Pt has annual appt with primary care tomorrow and is scheduled to have PAP  Tyresse Jayson L. Harraway-Smith, M.D., [place]

## 2014-08-22 NOTE — Patient Instructions (Signed)
Levonorgestrel intrauterine device (IUD) What is this medicine? LEVONORGESTREL IUD (LEE voe nor jes trel) is a contraceptive (birth control) device. The device is placed inside the uterus by a healthcare professional. It is used to prevent pregnancy and can also be used to treat heavy bleeding that occurs during your period. Depending on the device, it can be used for 3 to 5 years. This medicine may be used for other purposes; ask your health care provider or pharmacist if you have questions. COMMON BRAND NAME(S): LILETTA, Mirena, Skyla What should I tell my health care provider before I take this medicine? They need to know if you have any of these conditions: -abnormal Pap smear -cancer of the breast, uterus, or cervix -diabetes -endometritis -genital or pelvic infection now or in the past -have more than one sexual partner or your partner has more than one partner -heart disease -history of an ectopic or tubal pregnancy -immune system problems -IUD in place -liver disease or tumor -problems with blood clots or take blood-thinners -use intravenous drugs -uterus of unusual shape -vaginal bleeding that has not been explained -an unusual or allergic reaction to levonorgestrel, other hormones, silicone, or polyethylene, medicines, foods, dyes, or preservatives -pregnant or trying to get pregnant -breast-feeding How should I use this medicine? This device is placed inside the uterus by a health care professional. Talk to your pediatrician regarding the use of this medicine in children. Special care may be needed. Overdosage: If you think you have taken too much of this medicine contact a poison control center or emergency room at once. NOTE: This medicine is only for you. Do not share this medicine with others. What if I miss a dose? This does not apply. What may interact with this medicine? Do not take this medicine with any of the following  medications: -amprenavir -bosentan -fosamprenavir This medicine may also interact with the following medications: -aprepitant -barbiturate medicines for inducing sleep or treating seizures -bexarotene -griseofulvin -medicines to treat seizures like carbamazepine, ethotoin, felbamate, oxcarbazepine, phenytoin, topiramate -modafinil -pioglitazone -rifabutin -rifampin -rifapentine -some medicines to treat HIV infection like atazanavir, indinavir, lopinavir, nelfinavir, tipranavir, ritonavir -St. John's wort -warfarin This list may not describe all possible interactions. Give your health care provider a list of all the medicines, herbs, non-prescription drugs, or dietary supplements you use. Also tell them if you smoke, drink alcohol, or use illegal drugs. Some items may interact with your medicine. What should I watch for while using this medicine? Visit your doctor or health care professional for regular check ups. See your doctor if you or your partner has sexual contact with others, becomes HIV positive, or gets a sexual transmitted disease. This product does not protect you against HIV infection (AIDS) or other sexually transmitted diseases. You can check the placement of the IUD yourself by reaching up to the top of your vagina with clean fingers to feel the threads. Do not pull on the threads. It is a good habit to check placement after each menstrual period. Call your doctor right away if you feel more of the IUD than just the threads or if you cannot feel the threads at all. The IUD may come out by itself. You may become pregnant if the device comes out. If you notice that the IUD has come out use a backup birth control method like condoms and call your health care provider. Using tampons will not change the position of the IUD and are okay to use during your period. What side effects may   I notice from receiving this medicine? Side effects that you should report to your doctor or  health care professional as soon as possible: -allergic reactions like skin rash, itching or hives, swelling of the face, lips, or tongue -fever, flu-like symptoms -genital sores -high blood pressure -no menstrual period for 6 weeks during use -pain, swelling, warmth in the leg -pelvic pain or tenderness -severe or sudden headache -signs of pregnancy -stomach cramping -sudden shortness of breath -trouble with balance, talking, or walking -unusual vaginal bleeding, discharge -yellowing of the eyes or skin Side effects that usually do not require medical attention (report to your doctor or health care professional if they continue or are bothersome): -acne -breast pain -change in sex drive or performance -changes in weight -cramping, dizziness, or faintness while the device is being inserted -headache -irregular menstrual bleeding within first 3 to 6 months of use -nausea This list may not describe all possible side effects. Call your doctor for medical advice about side effects. You may report side effects to FDA at 1-800-FDA-1088. Where should I keep my medicine? This does not apply. NOTE: This sheet is a summary. It may not cover all possible information. If you have questions about this medicine, talk to your doctor, pharmacist, or health care provider.  2015, [employer] Standard. (2011-02-05 13:54:04)  

## 2014-08-23 ENCOUNTER — Ambulatory Visit (INDEPENDENT_AMBULATORY_CARE_PROVIDER_SITE_OTHER): Payer: Medicaid Other | Admitting: Family Medicine

## 2014-08-23 ENCOUNTER — Encounter: Payer: Self-pay | Admitting: Family Medicine

## 2014-08-23 VITALS — BP 123/76 | HR 98 | Temp 98.6°F | Ht 66.0 in | Wt 184.0 lb

## 2014-08-23 DIAGNOSIS — R29898 Other symptoms and signs involving the musculoskeletal system: Secondary | ICD-10-CM

## 2014-08-23 DIAGNOSIS — E108 Type 1 diabetes mellitus with unspecified complications: Secondary | ICD-10-CM | POA: Diagnosis not present

## 2014-08-23 LAB — POCT GLYCOSYLATED HEMOGLOBIN (HGB A1C): Hemoglobin A1C: 8.6

## 2014-08-23 NOTE — Assessment & Plan Note (Signed)
On lantus 45 units, novolo 18 units TID. Not able to review glucometer. A1c is improved from 9.1 to 8.6, however still not at goal (~7). She had 1 hypoglycemic event. At this point recommended she continue her current regimen and at next visit make certain she checks her sugar regularly and bring meter/log to clinic visit. Encouraged to call and schedule her DM eye exam. F/u 3 months.

## 2014-08-23 NOTE — Patient Instructions (Addendum)
Diabetes: Make sure you check your sugar every morning. This is important to help the doctors change your lantus dose as needed. Bring your meter in with you every visit. For now we will continue you on the current doses. You should get your A1c re-checked in about 3 months.  Make sure to schedule your eye doctor appointment  Legs: For the pain you can try increasing the gabapentin to 600mg . Start with taking the 600mg  at night for 1-2 weeks, then if you are tolerating it you can increase the day dose as well.  While you are more unsteady on your feet you should be using the rolling walker. Continue any rehab/strengthening exercises that the physical therapists have shown you.  You can also talk to your psychiatrist about seeing if they think switching you to a medicine like Effexor or Cymbalta would be helpful. These 2 medicines are similar to the zoloft but have some evidence that they may help nerve pain in your legs.  You are also due for your pap smear, so you can schedule this with your regular doctor at your convenience.  I would recommend following up in about 1 month to see how the increase in gabapentin is doing.

## 2014-08-23 NOTE — Progress Notes (Signed)
   Subjective:    Patient ID: Barbara Clark, female    DOB: 1975-10-13, 39 y.o.   MRN: [id number]  HPI  CC: diabetes follow up, falls  # T1DM:  Currently taking 45 units lantus, 18 units of novolog 3 times a day  CBG: thinks normally in 200s, highest was 326. Not sure of her normal morning sugars, she had 1 low of 43 and high of 180. She only checks her morning sugar about every other day.  Has not gone to eye doctor this year ROS: no polyuria/polydipsia  # Falls / leg weakness  Chronic leg weakness since November 2014, has been evaluated by multiple doctors and neurology and she says the best diagnosis was that her depression is causing this. She currently follows with a psychiatrist  In past week or so has had more frequent falls, reports 5 total. Has not hit her head. The falls occur when her right leg gives out on her.   She has recently been to PT and has another appt soon  She gets pain in both legs, taking gabapentin 300mg  BID and tramadol; tramadol does not help.  ROS: no bowel/bladder incontinence  # Tobacco use:  Trying to quit (using patches) but finds her self smoking again due to frustration/stressors with her legs  Review of Systems   See HPI for ROS.   Past medical history, surgical, family, and social history reviewed and updated in the EMR as appropriate. Objective:  BP 123/76 mmHg  Pulse 98  Temp(Src) 98.6 F (37 C) (Oral)  Ht 5\' 6"  (1.676 m)  Wt 184 lb (83.462 kg)  BMI 29.71 kg/m2  LMP 08/13/2014 Vitals and nursing note reviewed  General: NAD CV: RRR, nl s1s2 no mrg. 2+ radial pulses bilaterally Resp: CTAB, nl effort Neuro: alert and oriented x 3. Significant weakness in both LE, unclear if it is from effort as she is able to stand on her legs with a cane walker assisting. 1+ reflexes in patellar and achilles bilaterally. Psych: mood normal, affect congruent. Normal thought content and speech.  Assessment & Plan:  See Problem List  Documentation

## 2014-08-23 NOTE — Assessment & Plan Note (Addendum)
This is a chronic issue for her since November 2014. Recently worsened with multiple falls. She is followed by physical therapy currently and has an appointment coming up. I recommended that she go back to the rolling walker for the time being and continue her home exercise program. I have given her instructions to increase her gabapentin dose to help with her pain and also discussed with her psychiatrist switching to Effexor or Cymbalta. I've asked her to follow-up in about 1 month

## 2014-08-31 ENCOUNTER — Other Ambulatory Visit: Payer: Self-pay | Admitting: Family Medicine

## 2014-08-31 MED ORDER — INSULIN ASPART 100 UNIT/ML FLEXPEN: 16.0000 [IU] | PEN_INJECTOR | Freq: Three times a day (TID) | SUBCUTANEOUS | Status: DC

## 2014-08-31 NOTE — Telephone Encounter (Signed)
I called in novolog  prescription to the pharmacy.

## 2014-08-31 NOTE — Telephone Encounter (Signed)
Pt checking status of rx for novolog, will be out after today, Pt goes to [facility]

## 2014-10-10 ENCOUNTER — Other Ambulatory Visit: Payer: Self-pay | Admitting: *Deleted

## 2014-10-11 MED ORDER — INSULIN GLARGINE 100 UNIT/ML SOLOSTAR PEN: 45.0000 [IU] | PEN_INJECTOR | Freq: Every day | SUBCUTANEOUS | Status: DC

## 2014-10-23 ENCOUNTER — Other Ambulatory Visit: Payer: Self-pay | Admitting: Family Medicine

## 2014-10-23 MED ORDER — TRAMADOL HCL 50 MG PO TABS: 50.0000 mg | ORAL_TABLET | Freq: Three times a day (TID) | ORAL | Status: DC | PRN

## 2014-10-23 NOTE — Telephone Encounter (Signed)
Pt called and would like a refill on her Tramadol called in. Please call patient so that she knows to go pick this up. jw

## 2014-10-23 NOTE — Telephone Encounter (Signed)
I called to confirm med/pharmacy. Patient then mentioned she has been falling more frequently. She has an upcoming appointment with me in 3 days. I recommended she goes to the ED if fall becomes too frequent or if having any trauma from her fall. She agreed with plan.  I called in her Tramadol to the pharmacy.

## 2014-10-26 ENCOUNTER — Ambulatory Visit (INDEPENDENT_AMBULATORY_CARE_PROVIDER_SITE_OTHER): Payer: Medicaid Other | Admitting: Family Medicine

## 2014-10-26 ENCOUNTER — Encounter: Payer: Self-pay | Admitting: Family Medicine

## 2014-10-26 VITALS — BP 108/73 | HR 89 | Temp 98.2°F | Ht 66.0 in | Wt 182.5 lb

## 2014-10-26 DIAGNOSIS — R29898 Other symptoms and signs involving the musculoskeletal system: Secondary | ICD-10-CM | POA: Diagnosis not present

## 2014-10-26 DIAGNOSIS — F339 Major depressive disorder, recurrent, unspecified: Secondary | ICD-10-CM

## 2014-10-26 DIAGNOSIS — Z23 Encounter for immunization: Secondary | ICD-10-CM | POA: Diagnosis not present

## 2014-10-26 DIAGNOSIS — M25561 Pain in right knee: Secondary | ICD-10-CM

## 2014-10-26 DIAGNOSIS — E1040 Type 1 diabetes mellitus with diabetic neuropathy, unspecified: Secondary | ICD-10-CM | POA: Diagnosis not present

## 2014-10-26 MED ORDER — PREGABALIN 50 MG PO CAPS: 50.0000 mg | ORAL_CAPSULE | Freq: Three times a day (TID) | ORAL | Status: DC

## 2014-10-26 NOTE — Assessment & Plan Note (Signed)
Now intolerant to Gabapentin. Plan to switch to Lyrica 50 mg TID. F/U as needed.

## 2014-10-26 NOTE — Progress Notes (Signed)
Subjective:     Patient ID: Barbara Clark, female   DOB: 21-Mar-1975, 39 y.o.   MRN: [id number]  HPI Weakness ( Right leg): Her right leg keep giving way with prolonged standing,There is associated pain pain both knees but worse on the right.Symptoms is worse with prolonged standing and ambulation.Pain is about 4-10/10 in severity. She has been falling at home more frequent, she fell few days ago, denies head injury. She recently had outpatient PT evaluation this year. She has home health aide daily 2.5 hrs. She goes to the gym to exercise but stopped few days ago, now all she does is walk around the park to improve her knee muscle strength. DM neuropathy: She is still having symptoms and is compliant with her Gabapentin, however she is having GI upset with the Gabapentin and is wondering if there is anything else she can use instead. Depression: She is compliant with her Zoloft and seroquel, she does not feel this is working well. She woke up with a sad mood this morning. She sometimes have suicidal thought on and off, but no plan. Denies suicidal thought now. She sees her psychologist every other week and she has an up coming appointment next week. She also sees her Psychiatrist at the end of the month.  Current Outpatient Prescriptions on File Prior to Visit  Medication Sig Dispense Refill  . insulin aspart (NOVOLOG) 100 UNIT/ML FlexPen Inject 16 Units into the skin 3 (three) times daily with meals. 15 mL 3  . Insulin Glargine (LANTUS SOLOSTAR) 100 UNIT/ML Solostar Pen Inject 45 Units into the skin daily. 15 mL 3  . QUEtiapine (SEROQUEL) 200 MG tablet Take 300 mg by mouth at bedtime.     . sertraline (ZOLOFT) 25 MG tablet Take 50 mg by mouth daily.     . traMADol (ULTRAM) 50 MG tablet Take 1 tablet (50 mg total) by mouth every 8 (eight) hours as needed. 90 tablet 1  . gabapentin (NEURONTIN) 300 MG capsule Take 1 capsule (300 mg total) by mouth 2 (two) times daily. Start with 1 tablet daily. 60  capsule 1  . glucose blood (ACCU-CHEK AVIVA PLUS) test strip Use to check capillary glucose three times daily. 100 each 11  . nicotine (NICODERM CQ - DOSED IN MG/24 HOURS) 14 mg/24hr patch Place 1 patch (14 mg total) onto the skin daily. 28 patch 0   No current facility-administered medications on file prior to visit.   Past Medical History  Diagnosis Date  . Diabetes mellitus     Type I  . Depression   . Anxiety   . HSV-2 (herpes simplex virus 2) infection 2012    serology      Review of Systems  Respiratory: Negative.   Cardiovascular: Negative.   Gastrointestinal: Negative.   Genitourinary: Negative.   All other systems reviewed and are negative.      Filed Vitals:   10/26/14 1041  BP: 108/73  Pulse: 89  Temp: 98.2 F (36.8 C)  TempSrc: Oral  Height: 5\' 6"  (1.676 m)  Weight: 182 lb 8 oz (82.781 kg)    Objective:   Physical Exam  Constitutional: She appears well-developed. No distress.  Cardiovascular: Normal rate, regular rhythm, normal heart sounds and intact distal pulses.   No murmur heard. Pulmonary/Chest: Effort normal and breath sounds normal. No respiratory distress. She has no wheezes.  Abdominal: Soft. Bowel sounds are normal. She exhibits no distension and no mass. There is no tenderness.  Musculoskeletal:  Right knee: She exhibits decreased range of motion. She exhibits no swelling. Tenderness found.       Left knee: She exhibits normal range of motion and no swelling. Tenderness found.  Gait steady with walking cane.  Psychiatric: Her speech is normal and behavior is normal. Judgment normal. Cognition and memory are normal. She exhibits a depressed mood. She expresses no homicidal ideation. She expresses no suicidal plans and no homicidal plans.  Not currently suicidal  Nursing note and vitals reviewed.      Assessment:     Right knee weakness.  DM neuropathy Depression    Plan:     Check problem list.

## 2014-10-26 NOTE — Patient Instructions (Signed)
Fall Prevention in the Home   Falls can cause injuries. They can happen to people of all ages. There are many things you can do to make your home safe and to help prevent falls.   WHAT CAN I DO ON THE OUTSIDE OF MY HOME?  · Regularly fix the edges of walkways and driveways and fix any cracks.  · Remove anything that might make you trip as you walk through a door, such as a raised step or threshold.  · Trim any bushes or trees on the path to your home.  · Use bright outdoor lighting.  · Clear any walking paths of anything that might make someone trip, such as rocks or tools.  · Regularly check to see if handrails are loose or broken. Make sure that both sides of any steps have handrails.  · Any raised decks and porches should have guardrails on the edges.  · Have any leaves, snow, or ice cleared regularly.  · Use sand or salt on walking paths during winter.  · Clean up any spills in your garage right away. This includes oil or grease spills.  WHAT CAN I DO IN THE BATHROOM?   · Use night lights.  · Install grab bars by the toilet and in the tub and shower. Do not use towel bars as grab bars.  · Use non-skid mats or decals in the tub or shower.  · If you need to sit down in the shower, use a plastic, non-slip stool.  · Keep the floor dry. Clean up any water that spills on the floor as soon as it happens.  · Remove soap buildup in the tub or shower regularly.  · Attach bath mats securely with double-sided non-slip rug tape.  · Do not have throw rugs and other things on the floor that can make you trip.  WHAT CAN I DO IN THE BEDROOM?  · Use night lights.  · Make sure that you have a light by your bed that is easy to reach.  · Do not use any sheets or blankets that are too big for your bed. They should not hang down onto the floor.  · Have a firm chair that has side arms. You can use this for support while you get dressed.  · Do not have throw rugs and other things on the floor that can make you trip.  WHAT CAN I DO IN  THE KITCHEN?  · Clean up any spills right away.  · Avoid walking on wet floors.  · Keep items that you use a lot in easy-to-reach places.  · If you need to reach something above you, use a strong step stool that has a grab bar.  · Keep electrical cords out of the way.  · Do not use floor polish or wax that makes floors slippery. If you must use wax, use non-skid floor wax.  · Do not have throw rugs and other things on the floor that can make you trip.  WHAT CAN I DO WITH MY STAIRS?  · Do not leave any items on the stairs.  · Make sure that there are handrails on both sides of the stairs and use them. Fix handrails that are broken or loose. Make sure that handrails are as long as the stairways.  · Check any carpeting to make sure that it is firmly attached to the stairs. Fix any carpet that is loose or worn.  · Avoid having throw rugs at the top   or bottom of the stairs. If you do have throw rugs, attach them to the floor with carpet tape.  · Make sure that you have a light switch at the top of the stairs and the bottom of the stairs. If you do not have them, ask someone to add them for you.  WHAT ELSE CAN I DO TO HELP PREVENT FALLS?  · Wear shoes that:    Do not have high heels.    Have rubber bottoms.    Are comfortable and fit you well.    Are closed at the toe. Do not wear sandals.  · If you use a stepladder:    Make sure that it is fully opened. Do not climb a closed stepladder.    Make sure that both sides of the stepladder are locked into place.    Ask someone to hold it for you, if possible.  · Clearly mark and make sure that you can see:    Any grab bars or handrails.    First and last steps.    Where the edge of each step is.  · Use tools that help you move around (mobility aids) if they are needed. These include:    Canes.    Walkers.    Scooters.    Crutches.  · Turn on the lights when you go into a dark area. Replace any light bulbs as soon as they burn out.  · Set up your furniture so you have a clear  path. Avoid moving your furniture around.  · If any of your floors are uneven, fix them.  · If there are any pets around you, be aware of where they are.  · Review your medicines with your doctor. Some medicines can make you feel dizzy. This can increase your chance of falling.  Ask your doctor what other things that you can do to help prevent falls.     This information is not intended to replace advice given to you by your health care provider. Make sure you discuss any questions you have with your health care provider.     Document Released: 11/01/2008 Document Revised: 05/22/2014 Document Reviewed: 02/09/2014  Elsevier Interactive Patient Education ©2016 [employer]

## 2014-10-26 NOTE — Assessment & Plan Note (Signed)
Patient not suicidal. I recommended counseling today with our behavioral health. She declined, she prefers care by her own psychologist and psychiatrist. Continue current dose of Zoloft and Seroquel. F/U with her Psych as scheduled.

## 2014-10-26 NOTE — Assessment & Plan Note (Signed)
She already completed course of out patient PT. Patient stated she still has her rolling walker at home, I recommended she uses this instead of the cane. Home knee muscle strengthening exercise handout was given. Xray of her knee ordered today. Continue tramadol prn pain. I will call her back with xray report.

## 2014-10-30 ENCOUNTER — Ambulatory Visit (HOSPITAL_COMMUNITY)
Admission: RE | Admit: 2014-10-30 | Discharge: 2014-10-30 | Disposition: A | Discharge status: Home / Self Care | Payer: Medicaid Other | Source: Ambulatory Visit | Attending: Family Medicine | Admitting: Family Medicine

## 2014-10-30 ENCOUNTER — Telehealth: Payer: Self-pay | Admitting: *Deleted

## 2014-10-30 DIAGNOSIS — R531 Weakness: Secondary | ICD-10-CM | POA: Insufficient documentation

## 2014-10-30 DIAGNOSIS — M25561 Pain in right knee: Secondary | ICD-10-CM | POA: Insufficient documentation

## 2014-10-30 NOTE — Telephone Encounter (Signed)
Results and message given to patient and she voiced understanding. Jazmin Hartsell,CMA

## 2014-10-30 NOTE — Telephone Encounter (Signed)
-----   Message from [name], MD sent at 10/30/2014  2:45 PM EDT ----- Please advise patient her knee xray is normal. Continue home exercise for now. Restart using rolling walker.

## 2014-10-30 NOTE — Telephone Encounter (Signed)
LM for patient to call to get xray results. Malahki Gasaway,CMA

## 2014-11-02 ENCOUNTER — Emergency Department (HOSPITAL_COMMUNITY): Payer: Medicaid Other

## 2014-11-02 ENCOUNTER — Encounter (HOSPITAL_COMMUNITY): Payer: Self-pay | Admitting: Emergency Medicine

## 2014-11-02 ENCOUNTER — Emergency Department (HOSPITAL_COMMUNITY)
Admission: EM | Admit: 2014-11-02 | Discharge: 2014-11-02 | Disposition: A | Discharge status: Home / Self Care | Payer: Medicaid Other | Attending: Emergency Medicine | Admitting: Emergency Medicine

## 2014-11-02 DIAGNOSIS — Z79899 Other long term (current) drug therapy: Secondary | ICD-10-CM | POA: Insufficient documentation

## 2014-11-02 DIAGNOSIS — Z794 Long term (current) use of insulin: Secondary | ICD-10-CM | POA: Insufficient documentation

## 2014-11-02 DIAGNOSIS — R42 Dizziness and giddiness: Secondary | ICD-10-CM | POA: Diagnosis not present

## 2014-11-02 DIAGNOSIS — Z720 Tobacco use: Secondary | ICD-10-CM | POA: Diagnosis not present

## 2014-11-02 DIAGNOSIS — W19XXXA Unspecified fall, initial encounter: Secondary | ICD-10-CM

## 2014-11-02 DIAGNOSIS — S79912A Unspecified injury of left hip, initial encounter: Secondary | ICD-10-CM | POA: Insufficient documentation

## 2014-11-02 DIAGNOSIS — Y929 Unspecified place or not applicable: Secondary | ICD-10-CM | POA: Diagnosis not present

## 2014-11-02 DIAGNOSIS — N390 Urinary tract infection, site not specified: Secondary | ICD-10-CM | POA: Diagnosis not present

## 2014-11-02 DIAGNOSIS — Y939 Activity, unspecified: Secondary | ICD-10-CM | POA: Diagnosis not present

## 2014-11-02 DIAGNOSIS — R112 Nausea with vomiting, unspecified: Secondary | ICD-10-CM | POA: Diagnosis not present

## 2014-11-02 DIAGNOSIS — Y999 Unspecified external cause status: Secondary | ICD-10-CM | POA: Diagnosis not present

## 2014-11-02 DIAGNOSIS — Z8619 Personal history of other infectious and parasitic diseases: Secondary | ICD-10-CM | POA: Diagnosis not present

## 2014-11-02 DIAGNOSIS — W01198A Fall on same level from slipping, tripping and stumbling with subsequent striking against other object, initial encounter: Secondary | ICD-10-CM | POA: Insufficient documentation

## 2014-11-02 DIAGNOSIS — E119 Type 2 diabetes mellitus without complications: Secondary | ICD-10-CM | POA: Insufficient documentation

## 2014-11-02 DIAGNOSIS — R55 Syncope and collapse: Secondary | ICD-10-CM | POA: Diagnosis present

## 2014-11-02 LAB — CBC WITH DIFFERENTIAL/PLATELET
Basophils Absolute: 0 10*3/uL (ref 0.0–0.1)
Basophils Relative: 0 %
Eosinophils Absolute: 0.1 10*3/uL (ref 0.0–0.7)
Eosinophils Relative: 1 %
HCT: 36.8 % (ref 36.0–46.0)
Hemoglobin: 12.3 g/dL (ref 12.0–15.0)
Lymphocytes Relative: 38 %
Lymphs Abs: 2.8 10*3/uL (ref 0.7–4.0)
MCH: 30.8 pg (ref 26.0–34.0)
MCHC: 33.4 g/dL (ref 30.0–36.0)
MCV: 92 fL (ref 78.0–100.0)
Monocytes Absolute: 0.5 10*3/uL (ref 0.1–1.0)
Monocytes Relative: 7 %
Neutro Abs: 3.9 10*3/uL (ref 1.7–7.7)
Neutrophils Relative %: 54 %
Platelets: 336 10*3/uL (ref 150–400)
RBC: 4 MIL/uL (ref 3.87–5.11)
RDW: 12.8 % (ref 11.5–15.5)
WBC: 7.2 10*3/uL (ref 4.0–10.5)

## 2014-11-02 LAB — URINALYSIS, ROUTINE W REFLEX MICROSCOPIC
Bilirubin Urine: NEGATIVE
Glucose, UA: >1000 mg/dL — AB
Ketones, ur: 15 mg/dL — AB
Leukocytes, UA: NEGATIVE
Nitrite: POSITIVE — AB
Protein, ur: NEGATIVE mg/dL
Specific Gravity, Urine: 1.016 (ref 1.005–1.030)
Urobilinogen, UA: 1 mg/dL (ref 0.0–1.0)
pH: 6 (ref 5.0–8.0)

## 2014-11-02 LAB — BASIC METABOLIC PANEL
Anion gap: 3 — ABNORMAL LOW (ref 5–15)
BUN: 6 mg/dL (ref 6–20)
CO2: 26 mmol/L (ref 22–32)
Calcium: 8.6 mg/dL — ABNORMAL LOW (ref 8.9–10.3)
Chloride: 109 mmol/L (ref 101–111)
Creatinine, Ser: 0.68 mg/dL (ref 0.44–1.00)
GFR calc Af Amer: >60 mL/min (ref >60)
GFR calc non Af Amer: >60 mL/min (ref >60)
Glucose, Bld: 213 mg/dL — ABNORMAL HIGH (ref 65–99)
Potassium: 4.2 mmol/L (ref 3.5–5.1)
Sodium: 138 mmol/L (ref 135–145)

## 2014-11-02 LAB — URINE MICROSCOPIC-ADD ON

## 2014-11-02 LAB — POC URINE PREG, ED: Preg Test, Ur: NEGATIVE

## 2014-11-02 LAB — CBG MONITORING, ED: Glucose-Capillary: 74 mg/dL (ref 65–99)

## 2014-11-02 MED ORDER — SODIUM CHLORIDE 0.9 % IV BOLUS (SEPSIS)
1000.0000 mL | Freq: Once | INTRAVENOUS | Status: AC
  Administered 2014-11-02: 1000 mL via INTRAVENOUS

## 2014-11-02 MED ORDER — FOSFOMYCIN TROMETHAMINE 3 G PO PACK
3.0000 g | PACK | Freq: Once | ORAL | Status: AC
  Administered 2014-11-02: 3 g via ORAL
  Filled 2014-11-02: qty 3

## 2014-11-02 MED ORDER — METOCLOPRAMIDE HCL 5 MG/ML IJ SOLN
10.0000 mg | Freq: Once | INTRAMUSCULAR | Status: AC
  Administered 2014-11-02: 10 mg via INTRAVENOUS
  Filled 2014-11-02: qty 2

## 2014-11-02 MED ORDER — KETOROLAC TROMETHAMINE 30 MG/ML IJ SOLN
30.0000 mg | Freq: Once | INTRAMUSCULAR | Status: AC
  Administered 2014-11-02: 30 mg via INTRAVENOUS
  Filled 2014-11-02: qty 1

## 2014-11-02 MED ORDER — ONDANSETRON HCL 4 MG/2ML IJ SOLN
4.0000 mg | Freq: Once | INTRAMUSCULAR | Status: AC
  Administered 2014-11-02: 4 mg via INTRAVENOUS
  Filled 2014-11-02: qty 2

## 2014-11-02 MED ORDER — DIPHENHYDRAMINE HCL 50 MG/ML IJ SOLN
25.0000 mg | Freq: Once | INTRAMUSCULAR | Status: AC
  Administered 2014-11-02: 25 mg via INTRAVENOUS
  Filled 2014-11-02: qty 1

## 2014-11-02 NOTE — ED Notes (Signed)
Pt off unit with CT 

## 2014-11-02 NOTE — Discharge Instructions (Signed)
As discussed, your evaluation today has been largely reassuring.  But, it is important that you monitor your condition carefully, and do not hesitate to return to the ED if you develop new, or concerning changes in your condition. ? ?Otherwise, please follow-up with your physician for appropriate ongoing care. ? ?

## 2014-11-02 NOTE — ED Provider Notes (Signed)
CSN: [id number]     Arrival date & time 11/02/14  1321 History   First MD Initiated Contact with Patient 11/02/14 1324     Chief Complaint  Patient presents with  . Loss of Consciousness  . Emesis    HPI  Patient presents after a fall.  Initially the patient is verbally responding to me, and history is provided by a companion who is with her. However, the patient subsequently is awake and alert, offering details as much as possible. Patient states that she's been feeling generally unwell for several days, and today had an episode of dizziness, worse with motion, when she went outside and fell, striking her head. Upon awakening, she remained dizzy, had episodes of nausea with vomiting, denies chest pain, but does complain of anterior hip pain, severe, left-sided, nonradiating. She denies confusion, but states that she does not know what happened earlier today. Patient rains oriented to self, place, not to time.  Past Medical History  Diagnosis Date  . Diabetes mellitus     Type I  . Depression   . Anxiety   . HSV-2 (herpes simplex virus 2) infection 2012    serology   Past Surgical History  Procedure Laterality Date  . Tubal ligation     Family History  Problem Relation Age of Onset  . Diabetes Mother   . Hypertension Mother   . Diabetes Maternal Grandmother    Social History  Substance Use Topics  . Smoking status: Current Every Day Smoker -- 1.00 packs/day    Types: Cigarettes  . Smokeless tobacco: Never Used     Comment: trying to quit  . Alcohol Use: 0.0 oz/week    0 Standard drinks or equivalent per week     Comment: rare but on occasion   OB History    Gravida Para Term Preterm AB TAB SAB Ectopic Multiple Living   3 3 3       3      Review of Systems  Constitutional:       Per HPI, otherwise negative  HENT:       Per HPI, otherwise negative  Respiratory:       Per HPI, otherwise negative  Cardiovascular:       Per HPI, otherwise negative   Gastrointestinal: Positive for nausea and vomiting.  Endocrine:       Negative aside from HPI  Genitourinary:       Neg aside from HPI   Musculoskeletal: Negative for neck pain and neck stiffness.  Skin: Negative.   Neurological: Positive for dizziness and syncope.      Allergies  Review of patient's allergies indicates no known allergies.  Home Medications   Prior to Admission medications   Medication Sig Start Date End Date Taking? Authorizing Provider  glucose blood (ACCU-CHEK AVIVA PLUS) test strip Use to check capillary glucose three times daily. 06/15/13  Yes [name], MD  insulin aspart (NOVOLOG) 100 UNIT/ML FlexPen Inject 16 Units into the skin 3 (three) times daily with meals. 08/31/14  Yes [name], MD  Insulin Glargine (LANTUS SOLOSTAR) 100 UNIT/ML Solostar Pen Inject 45 Units into the skin daily. 10/11/14  Yes [name], MD  nicotine (NICODERM CQ - DOSED IN MG/24 HOURS) 14 mg/24hr patch Place 1 patch (14 mg total) onto the skin daily. 04/27/14  Yes [name], MD  traMADol (ULTRAM) 50 MG tablet Take 1 tablet (50 mg total) by mouth every 8 (eight) hours as needed. Patient taking  differently: Take 50 mg by mouth every 8 (eight) hours as needed. For leg pain 10/23/14  Yes [name], MD  pregabalin (LYRICA) 50 MG capsule Take 1 capsule (50 mg total) by mouth 3 (three) times daily. Patient not taking: Reported on 11/02/2014 10/26/14   [name], MD   BP 109/70 mmHg  Pulse 83  Temp(Src) 98.5 F (36.9 C) (Oral)  Resp 16  SpO2 100% Physical Exam  Constitutional: She is oriented to person, place, and time. She appears well-developed and well-nourished. No distress.  HENT:  Head: Normocephalic and atraumatic.  Eyes: Conjunctivae and EOM are normal.  Neck: Full passive range of motion without pain. Neck supple. No tracheal deviation present. No thyromegaly present.  Cardiovascular: Normal rate and regular rhythm.   Pulmonary/Chest:  Effort normal and breath sounds normal. No stridor. No respiratory distress.  Abdominal: She exhibits no distension.  Musculoskeletal: She exhibits no edema.  Lymphadenopathy:    She has no cervical adenopathy.  Neurological: She is alert and oriented to person, place, and time. She displays no atrophy and no tremor. No cranial nerve deficit or sensory deficit. She exhibits normal muscle tone. She displays no seizure activity. Coordination normal.  Patient remains amnestic to the event  Skin: Skin is warm and dry.  Psychiatric: She has a normal mood and affect.  Nursing note and vitals reviewed.   ED Course  Procedures (including critical care time) Labs Review Labs Reviewed  BASIC METABOLIC PANEL - Abnormal; Notable for the following:    Glucose, Bld 213 (*)    Calcium 8.6 (*)    Anion gap 3 (*)    All other components within normal limits  URINALYSIS, ROUTINE W REFLEX MICROSCOPIC (NOT AT [facility]) - Abnormal; Notable for the following:    APPearance CLOUDY (*)    Glucose, UA >1000 (*)    Hgb urine dipstick MODERATE (*)    Ketones, ur 15 (*)    Nitrite POSITIVE (*)    All other components within normal limits  URINE MICROSCOPIC-ADD ON - Abnormal; Notable for the following:    Squamous Epithelial / LPF FEW (*)    Bacteria, UA MANY (*)    All other components within normal limits  CBC WITH DIFFERENTIAL/PLATELET  POC URINE PREG, ED  CBG MONITORING, ED    Imaging Review No results found. I have personally reviewed and evaluated these images and lab results as part of my medical decision-making.   EKG Interpretation   Date/Time:  Friday November 02 2014 13:27:39 EDT Ventricular Rate:  86 PR Interval:  161 QRS Duration: 78 QT Interval:  356 QTC Calculation: 426 R Axis:   74 Text Interpretation:  Sinus rhythm Low voltage, precordial leads Sinus  rhythm Normal ECG Confirmed by Carmin Muskrat  MD ([id number]) on 11/02/2014  2:01:15 PM      MDM   Final diagnoses:  Dizziness   Fall, initial encounter  UTI (lower urinary tract infection)   Patient presents after episodes of dizziness, that resulted in a fall. Given the patient's persistent pain, amnesia to the event, CT scan was performed in addition to labs. Labs notable for urinary tract infection. Patient was neurologically intact, but in the aforementioned neurologic changes, CT scan was anticipated, but pending on sign out.  Late addendum, CT scan unremarkable, patient is changes, was discharged in stable condition.  Carmin Muskrat, MD 11/03/14 Tresa Moore

## 2014-11-02 NOTE — ED Notes (Addendum)
Per EMS- pt had her flu shot Friday and has been feeling sick since then, weak. Pt denies vomiting or diarrhea. Pt reports dizziness and dry heaves. Pt was at [employer] when she started to feel like the room was spinning while sitting at a computer. Pt went to leave and tripped out of the truck, fell onto her head. Pt was unconscious for two minutes reported by AID. Pt lethargic and dizzy, spitting up saliva. Pt alert and oriented. Pt normally walks with a walker due to issue with her legs. Pt CBG was 200. Pt reports eating only chips so far today. Pt alert and oriented complaining of headache from fall. Pt able to sit up to take off shirt.

## 2014-11-02 NOTE — ED Notes (Signed)
PT placed on bedpan. Tolerated well.

## 2014-11-02 NOTE — ED Notes (Signed)
Yvonne [phone]

## 2014-11-02 NOTE — ED Provider Notes (Signed)
Care assumed from Dr. Vanita Panda. patient's head CT is negative. She is tolerating by mouth and ambulatory. She was given medications for her headache which improved it. No chest pain or shortness of breath.  She appears to Dr. baseline. She has no other neurological deficits.  CBG 74 and she at a meal.   BP 109/71 mmHg  Pulse 87  Temp(Src) 98.5 F (36.9 C) (Oral)  Resp 20  SpO2 100%   Ezequiel Essex, MD 11/02/14 1840

## 2014-11-02 NOTE — ED Notes (Signed)
D-  daughter  [phone].  Maudie Mercury-  sister [phone]

## 2014-11-06 ENCOUNTER — Ambulatory Visit: Payer: Medicaid Other | Admitting: Family Medicine

## 2014-11-06 ENCOUNTER — Encounter: Payer: Self-pay | Admitting: Family Medicine

## 2014-11-06 ENCOUNTER — Encounter (HOSPITAL_COMMUNITY): Payer: Self-pay | Admitting: Vascular Surgery

## 2014-11-06 ENCOUNTER — Observation Stay (HOSPITAL_COMMUNITY)
Admission: EM | Admit: 2014-11-06 | Discharge: 2014-11-07 | Disposition: A | Discharge status: Home / Self Care | Payer: Medicaid Other | Attending: Family Medicine | Admitting: Family Medicine

## 2014-11-06 DIAGNOSIS — R519 Headache, unspecified: Secondary | ICD-10-CM

## 2014-11-06 DIAGNOSIS — E1165 Type 2 diabetes mellitus with hyperglycemia: Secondary | ICD-10-CM | POA: Diagnosis not present

## 2014-11-06 DIAGNOSIS — Z9119 Patient's noncompliance with other medical treatment and regimen: Secondary | ICD-10-CM | POA: Diagnosis not present

## 2014-11-06 DIAGNOSIS — E1140 Type 2 diabetes mellitus with diabetic neuropathy, unspecified: Secondary | ICD-10-CM | POA: Diagnosis not present

## 2014-11-06 DIAGNOSIS — F339 Major depressive disorder, recurrent, unspecified: Secondary | ICD-10-CM | POA: Insufficient documentation

## 2014-11-06 DIAGNOSIS — R739 Hyperglycemia, unspecified: Secondary | ICD-10-CM | POA: Diagnosis not present

## 2014-11-06 DIAGNOSIS — R51 Headache: Secondary | ICD-10-CM | POA: Diagnosis present

## 2014-11-06 DIAGNOSIS — F17210 Nicotine dependence, cigarettes, uncomplicated: Secondary | ICD-10-CM | POA: Insufficient documentation

## 2014-11-06 DIAGNOSIS — R55 Syncope and collapse: Secondary | ICD-10-CM | POA: Diagnosis not present

## 2014-11-06 DIAGNOSIS — Z794 Long term (current) use of insulin: Secondary | ICD-10-CM | POA: Insufficient documentation

## 2014-11-06 DIAGNOSIS — R4182 Altered mental status, unspecified: Secondary | ICD-10-CM | POA: Diagnosis present

## 2014-11-06 DIAGNOSIS — F449 Dissociative and conversion disorder, unspecified: Secondary | ICD-10-CM | POA: Diagnosis present

## 2014-11-06 DIAGNOSIS — Z9181 History of falling: Secondary | ICD-10-CM | POA: Insufficient documentation

## 2014-11-06 DIAGNOSIS — E11649 Type 2 diabetes mellitus with hypoglycemia without coma: Secondary | ICD-10-CM | POA: Diagnosis not present

## 2014-11-06 DIAGNOSIS — R400 Somnolence: Secondary | ICD-10-CM

## 2014-11-06 LAB — CSF CELL COUNT WITH DIFFERENTIAL
Eosinophils, CSF: NONE SEEN % (ref 0–1)
Eosinophils, CSF: NONE SEEN % (ref 0–1)
RBC Count, CSF: 4 /mm3 — ABNORMAL HIGH
RBC Count, CSF: 2 /mm3 — ABNORMAL HIGH
Segmented Neutrophils-CSF: NONE SEEN % (ref 0–6)
Segmented Neutrophils-CSF: NONE SEEN % (ref 0–6)
Tube #: 1
Tube #: 4
WBC, CSF: 2 /mm3 (ref 0–5)
WBC, CSF: 1 /mm3 (ref 0–5)

## 2014-11-06 LAB — RAPID URINE DRUG SCREEN, HOSP PERFORMED
Amphetamines: NOT DETECTED
Barbiturates: NOT DETECTED
Benzodiazepines: NOT DETECTED
Cocaine: NOT DETECTED
Opiates: NOT DETECTED
Tetrahydrocannabinol: NOT DETECTED

## 2014-11-06 LAB — HEPATIC FUNCTION PANEL
ALT: 11 U/L — ABNORMAL LOW (ref 14–54)
AST: 14 U/L — ABNORMAL LOW (ref 15–41)
Albumin: 3.3 g/dL — ABNORMAL LOW (ref 3.5–5.0)
Alkaline Phosphatase: 105 U/L (ref 38–126)
Bilirubin, Direct: <0.1 mg/dL — ABNORMAL LOW (ref 0.1–0.5)
Total Bilirubin: 0.5 mg/dL (ref 0.3–1.2)
Total Protein: 6.5 g/dL (ref 6.5–8.1)

## 2014-11-06 LAB — URINALYSIS, ROUTINE W REFLEX MICROSCOPIC
Bilirubin Urine: NEGATIVE
Glucose, UA: >1000 mg/dL — AB
Ketones, ur: 40 mg/dL — AB
Leukocytes, UA: NEGATIVE
Nitrite: NEGATIVE
Protein, ur: NEGATIVE mg/dL
Specific Gravity, Urine: 1.034 — ABNORMAL HIGH (ref 1.005–1.030)
Urobilinogen, UA: 0.2 mg/dL (ref 0.0–1.0)
pH: 6 (ref 5.0–8.0)

## 2014-11-06 LAB — I-STAT ARTERIAL BLOOD GAS, ED
Acid-base deficit: 1 mmol/L (ref 0.0–2.0)
Bicarbonate: 22.9 mEq/L (ref 20.0–24.0)
O2 Saturation: 98 %
Patient temperature: 98.6
TCO2: 24 mmol/L (ref 0–100)
pCO2 arterial: 35.8 mmHg (ref 35.0–45.0)
pH, Arterial: 7.415 (ref 7.350–7.450)
pO2, Arterial: 97 mmHg (ref 80.0–100.0)

## 2014-11-06 LAB — CBG MONITORING, ED
Glucose-Capillary: 404 mg/dL — ABNORMAL HIGH (ref 65–99)
Glucose-Capillary: 360 mg/dL — ABNORMAL HIGH (ref 65–99)
Glucose-Capillary: 282 mg/dL — ABNORMAL HIGH (ref 65–99)

## 2014-11-06 LAB — BASIC METABOLIC PANEL
Anion gap: 12 (ref 5–15)
BUN: 7 mg/dL (ref 6–20)
CO2: 23 mmol/L (ref 22–32)
Calcium: 9.2 mg/dL (ref 8.9–10.3)
Chloride: 101 mmol/L (ref 101–111)
Creatinine, Ser: 0.72 mg/dL (ref 0.44–1.00)
GFR calc Af Amer: >60 mL/min (ref >60)
GFR calc non Af Amer: >60 mL/min (ref >60)
Glucose, Bld: 397 mg/dL — ABNORMAL HIGH (ref 65–99)
Potassium: 5.3 mmol/L — ABNORMAL HIGH (ref 3.5–5.1)
Sodium: 136 mmol/L (ref 135–145)

## 2014-11-06 LAB — PROTEIN, CSF: Total  Protein, CSF: 37 mg/dL (ref 15–45)

## 2014-11-06 LAB — CBC
HCT: 39.6 % (ref 36.0–46.0)
Hemoglobin: 12.8 g/dL (ref 12.0–15.0)
MCH: 29.9 pg (ref 26.0–34.0)
MCHC: 32.3 g/dL (ref 30.0–36.0)
MCV: 92.5 fL (ref 78.0–100.0)
Platelets: 367 10*3/uL (ref 150–400)
RBC: 4.28 MIL/uL (ref 3.87–5.11)
RDW: 13 % (ref 11.5–15.5)
WBC: 9 10*3/uL (ref 4.0–10.5)

## 2014-11-06 LAB — I-STAT CG4 LACTIC ACID, ED: Lactic Acid, Venous: 2.07 mmol/L (ref 0.5–2.0)

## 2014-11-06 LAB — URINE MICROSCOPIC-ADD ON

## 2014-11-06 LAB — BETA-HYDROXYBUTYRIC ACID: Beta-Hydroxybutyric Acid: 2.01 mmol/L — ABNORMAL HIGH (ref 0.05–0.27)

## 2014-11-06 LAB — GLUCOSE, CSF: Glucose, CSF: 169 mg/dL — ABNORMAL HIGH (ref 40–70)

## 2014-11-06 LAB — I-STAT BETA HCG BLOOD, ED (MC, WL, AP ONLY): I-stat hCG, quantitative: <5 m[IU]/mL (ref <5)

## 2014-11-06 LAB — CRYPTOCOCCAL ANTIGEN, CSF: Crypto Ag: NEGATIVE

## 2014-11-06 LAB — GLUCOSE, CAPILLARY
Glucose-Capillary: 398 mg/dL — ABNORMAL HIGH (ref 65–99)
Glucose-Capillary: 367 mg/dL — ABNORMAL HIGH (ref 65–99)

## 2014-11-06 LAB — AMMONIA: Ammonia: 22 umol/L (ref 9–35)

## 2014-11-06 LAB — POTASSIUM: Potassium: 4.4 mmol/L (ref 3.5–5.1)

## 2014-11-06 MED ORDER — METOCLOPRAMIDE HCL 5 MG/ML IJ SOLN
10.0000 mg | Freq: Once | INTRAMUSCULAR | Status: AC
  Administered 2014-11-06: 10 mg via INTRAVENOUS
  Filled 2014-11-06: qty 2

## 2014-11-06 MED ORDER — ENOXAPARIN SODIUM 40 MG/0.4ML ~~LOC~~ SOLN
40.0000 mg | SUBCUTANEOUS | Status: DC
  Administered 2014-11-06: 40 mg via SUBCUTANEOUS
  Filled 2014-11-06: qty 0.4

## 2014-11-06 MED ORDER — SERTRALINE HCL 25 MG PO TABS
25.0000 mg | ORAL_TABLET | Freq: Every day | ORAL | Status: DC
  Administered 2014-11-06 – 2014-11-07 (×2): 25 mg via ORAL
  Filled 2014-11-06 (×2): qty 1

## 2014-11-06 MED ORDER — INSULIN ASPART 100 UNIT/ML FLEXPEN
16.0000 [IU] | PEN_INJECTOR | Freq: Three times a day (TID) | SUBCUTANEOUS | Status: DC
  Filled 2014-11-06: qty 3

## 2014-11-06 MED ORDER — DEXTROSE-NACL 5-0.45 % IV SOLN: INTRAVENOUS | Status: DC

## 2014-11-06 MED ORDER — ONDANSETRON HCL 4 MG/2ML IJ SOLN
4.0000 mg | Freq: Once | INTRAMUSCULAR | Status: AC
  Administered 2014-11-06: 4 mg via INTRAVENOUS
  Filled 2014-11-06: qty 2

## 2014-11-06 MED ORDER — INSULIN ASPART 100 UNIT/ML ~~LOC~~ SOLN
16.0000 [IU] | Freq: Three times a day (TID) | SUBCUTANEOUS | Status: DC
  Administered 2014-11-07: 16 [IU] via SUBCUTANEOUS

## 2014-11-06 MED ORDER — INSULIN ASPART 100 UNIT/ML ~~LOC~~ SOLN: 10.0000 [IU] | Freq: Once | SUBCUTANEOUS | Status: DC

## 2014-11-06 MED ORDER — SODIUM CHLORIDE 0.9 % IV SOLN
INTRAVENOUS | Status: DC
  Administered 2014-11-06: 12:00:00 via INTRAVENOUS

## 2014-11-06 MED ORDER — KETOROLAC TROMETHAMINE 60 MG/2ML IM SOLN
60.0000 mg | Freq: Once | INTRAMUSCULAR | Status: DC
  Filled 2014-11-06: qty 2

## 2014-11-06 MED ORDER — LIDOCAINE HCL 2 % IJ SOLN
20.0000 mL | Freq: Once | INTRAMUSCULAR | Status: AC
  Administered 2014-11-06: 400 mg
  Filled 2014-11-06: qty 20

## 2014-11-06 MED ORDER — SODIUM CHLORIDE 0.9 % IV SOLN
INTRAVENOUS | Status: DC
  Administered 2014-11-06: 3 [IU]/h via INTRAVENOUS
  Filled 2014-11-06: qty 2.5

## 2014-11-06 MED ORDER — SODIUM CHLORIDE 0.9 % IV SOLN: INTRAVENOUS | Status: DC

## 2014-11-06 MED ORDER — DEXTROSE 50 % IV SOLN: 25.0000 mL | INTRAVENOUS | Status: DC | PRN

## 2014-11-06 MED ORDER — INSULIN REGULAR BOLUS VIA INFUSION
0.0000 [IU] | Freq: Three times a day (TID) | INTRAVENOUS | Status: DC
  Filled 2014-11-06: qty 10

## 2014-11-06 MED ORDER — INSULIN GLARGINE 100 UNIT/ML ~~LOC~~ SOLN
45.0000 [IU] | Freq: Every day | SUBCUTANEOUS | Status: DC
  Administered 2014-11-06: 45 [IU] via SUBCUTANEOUS
  Filled 2014-11-06 (×2): qty 0.45

## 2014-11-06 MED ORDER — KETOROLAC TROMETHAMINE 30 MG/ML IJ SOLN
30.0000 mg | Freq: Once | INTRAMUSCULAR | Status: AC
  Administered 2014-11-06: 30 mg via INTRAVENOUS
  Filled 2014-11-06: qty 1

## 2014-11-06 MED ORDER — QUETIAPINE FUMARATE ER 200 MG PO TB24
200.0000 mg | ORAL_TABLET | Freq: Every day | ORAL | Status: DC
  Administered 2014-11-06: 200 mg via ORAL
  Filled 2014-11-06 (×2): qty 1

## 2014-11-06 MED ORDER — INSULIN ASPART 100 UNIT/ML ~~LOC~~ SOLN
0.0000 [IU] | SUBCUTANEOUS | Status: DC
  Administered 2014-11-06: 15 [IU] via SUBCUTANEOUS
  Administered 2014-11-07: 8 [IU] via SUBCUTANEOUS
  Administered 2014-11-07: 3 [IU] via SUBCUTANEOUS

## 2014-11-06 MED ORDER — SODIUM CHLORIDE 0.9 % IV SOLN: Freq: Once | INTRAVENOUS | Status: DC

## 2014-11-06 MED ORDER — SODIUM CHLORIDE 0.9 % IV SOLN
INTRAVENOUS | Status: DC
  Administered 2014-11-06 – 2014-11-07 (×2): via INTRAVENOUS

## 2014-11-06 MED ORDER — TRAMADOL HCL 50 MG PO TABS
50.0000 mg | ORAL_TABLET | Freq: Three times a day (TID) | ORAL | Status: DC | PRN
  Administered 2014-11-07: 50 mg via ORAL
  Filled 2014-11-06: qty 1

## 2014-11-06 MED ORDER — DIPHENHYDRAMINE HCL 50 MG/ML IJ SOLN
50.0000 mg | Freq: Once | INTRAMUSCULAR | Status: AC
  Administered 2014-11-06: 50 mg via INTRAVENOUS
  Filled 2014-11-06: qty 1

## 2014-11-06 MED ORDER — PREGABALIN 25 MG PO CAPS
50.0000 mg | ORAL_CAPSULE | Freq: Three times a day (TID) | ORAL | Status: DC
  Administered 2014-11-06 – 2014-11-07 (×3): 50 mg via ORAL
  Filled 2014-11-06 (×3): qty 2

## 2014-11-06 NOTE — ED Provider Notes (Signed)
Pt also seen by me. Pt with AMS, syncope? Coming from PCP's office by EMS. Pt initially refusing to speak but then admits to having a headache. Pt is hyperglycemic, initially suspect DKA, started fluids and stabilizer.   Patient's vital signs are normal. Her labs did not show any evidence of DKA. Sugar was lowered to 282. Potassium slightly elevated initially, recheck is that 4.4. Patient states her headache improved after migraine cocktail. Patient's family practice service came and evaluated patient, asked for lumbar puncture.  .Lumbar Puncture Date/Time: 11/06/2014 5:21 PM Performed by: Jeannett Senior Authorized by: Jeannett Senior Consent: Verbal consent obtained. Written consent obtained. Risks and benefits: risks, benefits and alternatives were discussed Consent given by: patient Patient understanding: patient states understanding of the procedure being performed Patient consent: the patient's understanding of the procedure matches consent given Procedure consent: procedure consent matches procedure scheduled Relevant documents: relevant documents present and verified Test results: test results available and properly labeled Patient identity confirmed: verbally with patient and arm band Time out: Immediately prior to procedure a "time out" was called to verify the correct patient, procedure, equipment, support staff and site/side marked as required. Indications: evaluation for altered mental status and evaluation for subarachnoid hemorrhage Anesthesia: local infiltration Local anesthetic: lidocaine 1% without epinephrine Anesthetic total: 5 ml Patient sedated: no Preparation: Patient was prepped and draped in the usual sterile fashion. Lumbar space: L4-L5 interspace Patient's position: left lateral decubitus Needle gauge: 20 Needle type: spinal needle - Quincke tip Needle length: 3.5 in Number of attempts: 1 Opening pressure: 15.2 cm H2O Fluid appearance: clear Tubes of  fluid: 4 Total volume: 4 ml Post-procedure: site cleaned, pressure dressing applied and adhesive bandage applied Patient tolerance: Patient tolerated the procedure well with no immediate complications    Lumbar puncture performed. Opening pressure was 15.2. Fluid sent for analysis. I confirmed with family practice, they will admit patient.  Jeannett Senior, PA-C 11/06/14 1722  Harvel Quale, MD 11/06/14 2157

## 2014-11-06 NOTE — H&P (Signed)
[facility] Admission History and Physical Service Pager: [phone]  Patient name: Barbara Clark Medical record number: [id number] Date of birth: 1975-08-03 Age: 39 y.o. Gender: female  Primary Care Provider: Andrena Mews, MD Consultants: None  Code Status: Full   Chief Complaint: Altered Mental Status   Assessment and Plan: Barbara Clark is a 39 y.o. female presenting with altered mental status . PMH is significant for T1DM, Major Depressive Disorder, Tobacco Abuse    Altered Mental Status/Headache: Patient with negative head CT on 10/18, making hemorrhagic bleed unlikely. Patient is afebrile, with normal WBC, negative UA and exam negative for nuchal rigidity and negative Kernig's, making infectious process less likely. While history of headache and altered mental status are concerning for meningitis, nl WBC and lack of fever make it less likely, however should be ruled out. Patient's ammonia level within normal limits. UDS also negative for substances.. Patient with elevated blood glucose of 404 on admission but trended to 282. K initially 5.3 but trended to 4.4 and lactic acid 2.07--> 1.2 after fluid hydration,  normal AG to 12,   Ph 1.017 making a metabolic source of mental status change unlikely. Patient has hx of multiple of episodes of being altered in the past with both elevated and low blood sugar,  relieved after her daughter gave her crackers or candy, making this potentially psychogenic, however will need to rule out other possible sources - Will admit to observation, attending Dr. Nori Riis  - Will get a lumbar puncture, f/u CSF studies - Start abx if develops fever, s/s of SIRS or CSF studies positive - f/u AM, CBC, BMP - consider MRI brain if worsening mental status - Continue to monitor  T1DM- Initially blood sugars elevated in the ED to 404, K 5.3, however trended down after fluid repletion. PH 7.4, AG 12. K trended down to 4.6, Lactic aid 2.07 -> 1.2  after fluid repletion, beta-hydroxybytyrate 2.01.  Initial electrolyte abnormalities likely due to dehydration - Continue home Lantus 45 units and meal time Novolog 15 units  - SSI, moderate - CBGs q4hr - MIVF 125 cc/hr  Diabetic Neuropathy- Concern for continued poorly controlled neuropathy given history of falling and need aide help with ambulation - Continue Gabapentin  - Continue home tramadol  - Consider Physical/Occupational therapy when able to participate  Major Depressive Disorder: Concern for mood component to her presentation. Given her long standing history of similar episodes in past potentially there may be a psychogenic etiology contributing - Continue home Zoloft and seroquel - if no improvement in AMS with complete normalization of electrolytes and blood sugars consider inpt Psych eval - Continue to monitor   Tobacco Abuse- Unable to assess how much pt currently smokes due to non compliance with exam and history taking, has been prescribed a nicotine patch in the past but unknown if patient still uses this - Consider nicotine patch, if becomes an issue  FEN/GI: Carb modified diet, MIVF 125 cc/hr, monitor I/Os Prophylaxis: Lovenox   Disposition: Home   History of Present Illness:  Barbara Clark is a 39 y.o. female presenting with Altered Mental Status. Most history provided by patient's daughter, mother and patient's aide. Pt fell on 10/14 while trying to exit her car. She has significant diabetic neuropathy and her aide typically helps her to exit her car. This time, however, she did not wait for her aide to help her exit the car, and she proceeded to fall and hit her head. Work up in ED was  negative. She did have a CT head at that time which was negative for pathology.   Patient continued to have headache and felt "unwell" over the weekend, however has been unable to pinpoint specifically how she felt unwell.  Today she again reported she did not feel well and because of  that did not eat anything this AM. On 10/18, she came to the clinic and "fell out" and was then sent by EMS to the ED. Per the aide, she just fell and was then unable to be aroused. Furthermore, daughter notes that patient has had multiple episodes of being altered similar to this in the past, often associated with her blood sugar levels, both elevated and low. Regardless of her blood sugar levels, her daughter would give her food to eat and this would resolve her AMS   Her nurse aide denies recent illness, fevers at home, states she has been taking her insulin as directed.   Review Of Systems: Per HPI with the following additions: Not able to obtain per patient  Otherwise 12 point review of systems was performed and was unremarkable.  Patient Active Problem List   Diagnosis Date Noted  . Menorrhagia 04/27/2014  . Chronic leg pain 04/27/2014  . DM neuropathy, type I diabetes mellitus ([place]) 03/13/2014  . Right leg weakness 02/20/2014  . Foot swelling 02/20/2014  . Bilateral swelling of feet 08/04/2013  . Limb weakness 08/04/2013  . Jaw pain 05/11/2013  . Stuttering 02/02/2013  . Abnormality of gait 12/26/2012  . Ethmoidal sinusitis 12/13/2012  . Vaginal discharge 04/12/2012  . High risk sexual behavior 04/12/2012  . Foot pain, bilateral 03/14/2012  . TOBACCO USER 10/27/2008  . Diabetes mellitus type I ([place]) 03/18/2006  . Major depressive disorder, recurrent episode ([place]) 03/18/2006  . MIGRAINE, UNSPEC., W/O INTRACTABLE MIGRAINE 03/18/2006   Past Medical History: Past Medical History  Diagnosis Date  . Diabetes mellitus     Type I  . Depression   . Anxiety   . HSV-2 (herpes simplex virus 2) infection 2012    serology   Past Surgical History: Past Surgical History  Procedure Laterality Date  . Tubal ligation     Social History: Social History  Substance Use Topics  . Smoking status: Current Every Day Smoker -- 1.00 packs/day    Types: Cigarettes  . Smokeless tobacco: Never  Used     Comment: trying to quit  . Alcohol Use: 0.0 oz/week    0 Standard drinks or equivalent per week     Comment: rare but on occasion   Additional social history: Lives with an aide.  Please also refer to relevant sections of EMR.  Family History: Family History  Problem Relation Age of Onset  . Diabetes Mother   . Hypertension Mother   . Diabetes Maternal Grandmother    Allergies and Medications: No Known Allergies No current facility-administered medications on file prior to encounter.   Current Outpatient Prescriptions on File Prior to Encounter  Medication Sig Dispense Refill  . insulin aspart (NOVOLOG) 100 UNIT/ML FlexPen Inject 16 Units into the skin 3 (three) times daily with meals. 15 mL 3  . Insulin Glargine (LANTUS SOLOSTAR) 100 UNIT/ML Solostar Pen Inject 45 Units into the skin daily. 15 mL 3  . traMADol (ULTRAM) 50 MG tablet Take 1 tablet (50 mg total) by mouth every 8 (eight) hours as needed. (Patient taking differently: Take 50 mg by mouth every 8 (eight) hours as needed. For leg pain) 90 tablet 1  Objective: BP 115/61 mmHg  Pulse 84  Temp(Src) 98.6 F (37 C) (Oral)  Resp 14  SpO2 99% Exam: General: Patient lying curled up in bed, NAD, responds to voice occasionally. Turns head away from light and draws blanket over head Eyes: Pupils Equal Round could not assess reactivity to light due to patient compliance. Neck: No deformities, or tenderness noted. No nuchal rigidity noted  Cardiovascular: RRR, no murmurs, no rubs, no gallops  Respiratory: CTAB, no increased WOB, exam limited to patient compliance with deep inspiration Abdomen: BS+, no ttp, no rebound, no guarding  MSK: No lower extremity edema  Skin: no sores or suspicious lesions or rashes or color changes Neuro: Unable to do a neurologic exam on patient  Psych: Patient not communicative, talking about a phone ring during visit, which did not occur.    Labs and Imaging: CBC BMET   Recent  Labs Lab 11/06/14 1243  WBC 9.0  HGB 12.8  HCT 39.6  PLT 367    Recent Labs Lab 11/06/14 1243 11/06/14 1333  NA 136  --   K 5.3* 4.4  CL 101  --   CO2 23  --   BUN 7  --   CREATININE 0.72  --   GLUCOSE 397*  --   CALCIUM 9.2  --      Results for Barbara, Clark (MRN [id number]) as of 11/06/2014 15:19  Ref. Range 11/06/2014 13:08  Sample type Unknown ARTERIAL  pH, Arterial Latest Ref Range: 7.350-7.450  7.415  pCO2 arterial Latest Ref Range: 35.0-45.0 mmHg 35.8  pO2, Arterial Latest Ref Range: 80.0-100.0 mmHg 97.0  Bicarbonate Latest Ref Range: 20.0-24.0 mEq/L 22.9  TCO2 Latest Ref Range: 0-100 mmol/L 24  Acid-base deficit Latest Ref Range: 0.0-2.0 mmol/L 1.0  O2 Saturation Latest Units: % 98.0  Patient temperature Unknown 98.6 F  Collection site Unknown RADIAL, ALLEN'S T...     Results for Barbara, Clark (MRN [id number]) as of 11/06/2014 15:19  Ref. Range 11/06/2014 13:33  Alkaline Phosphatase Latest Ref Range: 38-126 U/L 105  Albumin Latest Ref Range: 3.5-5.0 g/dL 3.3 (L)  AST Latest Ref Range: 15-41 U/L 14 (L)  ALT Latest Ref Range: 14-54 U/L 11 (L)  Total Protein Latest Ref Range: 6.5-8.1 g/dL 6.5  Ammonia Latest Ref Range: 9-35 umol/L 22  Bilirubin, Direct Latest Ref Range: 0.1-0.5 mg/dL <0.1 (L)  Indirect Bilirubin Latest Ref Range: 0.3-0.9 mg/dL NOT CALCULATED  Total Bilirubin Latest Ref Range: 0.3-1.2 mg/dL 0.5    Barbara Cletis Media, MD 11/06/2014, 3:47 PM PGY-1, [facility] Intern pager: [phone], text pages welcome   I have seen and examined the patient. I have read and agree with the above note. My changes are noted in blue.  Afrika Brick A. Lincoln Brigham MD, [facility] Resident PGY-2 Pager [phone]

## 2014-11-06 NOTE — Progress Notes (Signed)
NURSING PROGRESS NOTE  Kerryn Tennant [id number] Admission Data: 11/06/2014 10:22 PM Attending Provider: Dickie La, MD ASU:ORVIFB, Tawanna Solo, MD Code Status: Full   Ka Bench is a 39 y.o. female patient admitted from ED:  -No acute distress noted.  -No complaints of shortness of breath.  -No complaints of chest pain.     Blood pressure 104/56, pulse 95, temperature 98.8 F (37.1 C), temperature source Oral, resp. rate 15, height 5\' 7"  (1.702 m), weight 81.7 kg (180 lb 1.9 oz), SpO2 100 %.   IV Fluids:  IV in place, occlusive dsg intact without redness, IV in Left wrist running NS @ 125  Allergies:  Review of patient's allergies indicates no known allergies.  Past Medical History:   has a past medical history of Diabetes mellitus; Depression; Anxiety; and HSV-2 (herpes simplex virus 2) infection (2012).  Past Surgical History:   has past surgical history that includes Tubal ligation.  Social History:   reports that she has been smoking Cigarettes.  She has been smoking about 1.00 pack per day. She has never used smokeless tobacco. She reports that she drinks alcohol. She reports that she does not use illicit drugs.  Skin: Intact  Patient/Family orientated to room. Information packet given to patient/family. Admission inpatient armband information verified with patient/family to include name and date of birth and placed on patient arm. Side rails up x 2, fall assessment and education completed with patient/family. Patient/family able to verbalize understanding of risk associated with falls and verbalized understanding to call for assistance before getting out of bed. Call light within reach. Patient/family able to voice and demonstrate understanding of unit orientation instructions.    Will continue to evaluate and treat per MD orders.     Lutricia Feil RN

## 2014-11-06 NOTE — Progress Notes (Signed)
Patient ID: Barbara Clark, female   DOB: 07-19-1975, 39 y.o.   MRN: [id number]  I was preceptor this morning in clinic. Patient came to clinic accompanied by her aide for a routinely scheduled visit with PCP Dr. Gwendlyn Deutscher. Patient reportedly passed out either outside or in waiting room. I was called by clinic nursing staff to evaluate patient in exam room.  Patient initially somnolent and not responding to verbal stimuli, eventually woke up slightly and could say her name and answer some questions, but minimally following instructions. Protected airway throughout encounter. Remained intermittently confused and lacking in orientation, complained of frontal headache. CBG checked and was 398. BP normal, mildly tachycardic.  EMS was called and patient transported to ED by EMS personnel for further evaluation given concern for DKA or other cause of metabolic encephalopathy. Initially patient refused transport to ED, saying she had just been seen there on Friday and they "didn't do anything" for her. Patient tearful during this discussion. Due to altered mental status patient was taken to ED by EMS staff despite her stating she did not want to go. PCP Dr. Gwendlyn Deutscher briefly spoke with patient and convinced her to cooperate with going to ED, promising admission to the hospital at end of ED visit.  I called and spoke with ED charge nurse, advising that patient would be coming via EMS and that [facility] should be contacted for admission once initial ED stabilization and workup completed.  Leeanne Rio, MD

## 2014-11-06 NOTE — ED Provider Notes (Signed)
CSN: [id number]     Arrival date & time 11/06/14  1143 History   First MD Initiated Contact with Patient 11/06/14 1202     Chief Complaint  Patient presents with  . Loss of Consciousness    HPI [name] is a 39 y.o. female with PMH including T1DM and LE weakness of unclear etiology who presents to the ED following a syncopal episode 1h PTA. Pt is accompanied by her care aide who provides her history. Upon interview, pt is drowsy and difficult to arouse. Per her aide, pt was on her way to PCP appointment this morning when she told aide she "didn't feel good." As her aide was helping her get out of the car, pt reportedly passed out into aide's arms. Per the aide, pt began feeling unwell four days ago when she presented to this ED after a similar syncopal episode. Since then she has "not been quite right." Pt has been complaining of headache, dizziness and nausea since last presentation. Pt is reportedly normally functioning at baseline and is active/alert. Her aide has not heard her complain of chest pain or difficulty breathing. Denies recent fever or URI. Denies history of cardiac events. Denies loss of bowel/bladder control.   During blood draw, pt awoke but thought she was at PCP office. Her main complaint is a diffuse frontal headache. It is difficult to get a clear history from the pt as she remains disoriented and drowsy.    Past Medical History  Diagnosis Date  . Diabetes mellitus     Type I  . Depression   . Anxiety   . HSV-2 (herpes simplex virus 2) infection 2012    serology   Past Surgical History  Procedure Laterality Date  . Tubal ligation     Family History  Problem Relation Age of Onset  . Diabetes Mother   . Hypertension Mother   . Diabetes Maternal Grandmother    Social History  Substance Use Topics  . Smoking status: Current Every Day Smoker -- 1.00 packs/day    Types: Cigarettes  . Smokeless tobacco: Never Used     Comment: trying to quit  . Alcohol Use: 0.0  oz/week    0 Standard drinks or equivalent per week     Comment: rare but on occasion   OB History    Gravida Para Term Preterm AB TAB SAB Ectopic Multiple Living   3 3 3       3      Review of Systems  All other systems reviewed and are negative.     Allergies  Review of patient's allergies indicates no known allergies.  Home Medications   Prior to Admission medications   Medication Sig Start Date End Date Taking? Authorizing Provider  glucose blood (ACCU-CHEK AVIVA PLUS) test strip Use to check capillary glucose three times daily. 06/15/13   [name], MD  insulin aspart (NOVOLOG) 100 UNIT/ML FlexPen Inject 16 Units into the skin 3 (three) times daily with meals. 08/31/14   [name], MD  Insulin Glargine (LANTUS SOLOSTAR) 100 UNIT/ML Solostar Pen Inject 45 Units into the skin daily. 10/11/14   [name], MD  nicotine (NICODERM CQ - DOSED IN MG/24 HOURS) 14 mg/24hr patch Place 1 patch (14 mg total) onto the skin daily. 04/27/14   [name], MD  pregabalin (LYRICA) 50 MG capsule Take 1 capsule (50 mg total) by mouth 3 (three) times daily. Patient not taking: Reported on 11/02/2014 10/26/14   Tawanna Solo  Julianne Rice, MD  traMADol (ULTRAM) 50 MG tablet Take 1 tablet (50 mg total) by mouth every 8 (eight) hours as needed. Patient taking differently: Take 50 mg by mouth every 8 (eight) hours as needed. For leg pain 10/23/14   [name], MD   BP 118/70 mmHg  Pulse 102  Temp(Src) 98.6 F (37 C) (Oral)  Resp 19  SpO2 98% Physical Exam  Constitutional:  Initially lethargic, later alert.   HENT:  Right Ear: External ear normal.  Left Ear: External ear normal.  Nose: Nose normal.  Mouth/Throat: Oropharynx is clear and moist. No oropharyngeal exudate.  Eyes: Conjunctivae and EOM are normal. Pupils are equal, round, and reactive to light.  Neck: Normal range of motion. Neck supple. No tracheal deviation present.  Cardiovascular: Normal rate, regular rhythm,  normal heart sounds and intact distal pulses.   No murmur heard. Pulmonary/Chest: Effort normal and breath sounds normal. No respiratory distress. She has no wheezes.  Abdominal: Soft. Bowel sounds are normal. She exhibits no distension. There is no tenderness. There is no rebound and no guarding.  Musculoskeletal: She exhibits no edema or tenderness.  Lymphadenopathy:    She has no cervical adenopathy.  Neurological: She is alert. No cranial nerve deficit.  Alert but believes she is at PCP office  Skin: Skin is warm and dry.  Nursing note and vitals reviewed.   ED Course  Procedures (including critical care time) Labs Review Labs Reviewed  BASIC METABOLIC PANEL - Abnormal; Notable for the following:    Potassium 5.3 (*)    Glucose, Bld 397 (*)    All other components within normal limits  URINALYSIS, ROUTINE W REFLEX MICROSCOPIC (NOT AT [facility]) - Abnormal; Notable for the following:    Specific Gravity, Urine 1.034 (*)    Glucose, UA >1000 (*)    Hgb urine dipstick LARGE (*)    Ketones, ur 40 (*)    All other components within normal limits  BETA-HYDROXYBUTYRIC ACID - Abnormal; Notable for the following:    Beta-Hydroxybutyric Acid 2.01 (*)    All other components within normal limits  URINE MICROSCOPIC-ADD ON - Abnormal; Notable for the following:    Squamous Epithelial / LPF FEW (*)    Bacteria, UA FEW (*)    All other components within normal limits  HEPATIC FUNCTION PANEL - Abnormal; Notable for the following:    Albumin 3.3 (*)    AST 14 (*)    ALT 11 (*)    Bilirubin, Direct <0.1 (*)    All other components within normal limits  CBG MONITORING, ED - Abnormal; Notable for the following:    Glucose-Capillary 404 (*)    All other components within normal limits  I-STAT CG4 LACTIC ACID, ED - Abnormal; Notable for the following:    Lactic Acid, Venous 2.07 (*)    All other components within normal limits  CBG MONITORING, ED - Abnormal; Notable for the following:     Glucose-Capillary 360 (*)    All other components within normal limits  CBG MONITORING, ED - Abnormal; Notable for the following:    Glucose-Capillary 282 (*)    All other components within normal limits  CBC  URINE RAPID DRUG SCREEN, HOSP PERFORMED  POTASSIUM  AMMONIA  BLOOD GAS, ARTERIAL  I-STAT BETA HCG BLOOD, ED ([facility], [facility], [facility] ONLY)  I-STAT ARTERIAL BLOOD GAS, ED    Imaging Review No results found. I have personally reviewed and evaluated these images and lab results as part  of my medical decision-making.   EKG Interpretation None      MDM   Final diagnoses:  None    Concern for DKA. In the ED, we had received notice that family medicine would be expecting and ready to admit this patient. Will start workup and treatment including BMP, CBC, beta-hydroxybutyrate, UA, LDH, ABG, UDS. Will start fluids and insulin. Pending labs will discuss with family med.   13:40 Lab workup unrevealing. Patient's main complaint is now headache. She continues to state she is at PCP office. Will try migraine cocktail. Holding on imaging for now as she had CT done at last ED visit.   14:45 Pt reports headache has "tempered down a little bit" but is still very drowsy in room. PA Kirichenko to discuss with family medicine whether patient needs admission as workup so far has been unrevealing.  15:05 Spoke w/ family med residents who saw pt with attending. They are requesting an LP given pt's prolonged headache and somewhat altered mental status.   16:15 LP performed by PA Kirichenko and Dr. Alfonse Spruce. Opening pressure 15.2. CSF labs ordered. Per family med, pt will be admitted.   Anne Ng, PA-C 11/06/14 1648  Noemi Chapel, MD 11/10/14 1228

## 2014-11-06 NOTE — ED Notes (Addendum)
Pt reports to the ED via [facility] for eval of syncopal episode. She was in the doctors office and had a syncopal episode that lasted a few minutes. Pt is a type 1 DM and her CBG on scene was 298 mg/dl.  She was here a few days ago and she was extremely hyperglycemia. Pt has a HA that is made worse with light. Denies any N/V. Hx of migraines. Pt was NSR to ST on the monitor. Pt lethargic and oriented to person only, unsure of month, place, and situation. Resp e/u, and skin warm and dry.

## 2014-11-07 ENCOUNTER — Observation Stay (HOSPITAL_COMMUNITY): Payer: Medicaid Other

## 2014-11-07 DIAGNOSIS — R400 Somnolence: Secondary | ICD-10-CM

## 2014-11-07 DIAGNOSIS — F449 Dissociative and conversion disorder, unspecified: Secondary | ICD-10-CM | POA: Diagnosis not present

## 2014-11-07 DIAGNOSIS — R51 Headache: Secondary | ICD-10-CM | POA: Diagnosis not present

## 2014-11-07 DIAGNOSIS — R739 Hyperglycemia, unspecified: Secondary | ICD-10-CM | POA: Diagnosis not present

## 2014-11-07 LAB — BASIC METABOLIC PANEL
Anion gap: 8 (ref 5–15)
BUN: 8 mg/dL (ref 6–20)
CO2: 26 mmol/L (ref 22–32)
Calcium: 8.9 mg/dL (ref 8.9–10.3)
Chloride: 108 mmol/L (ref 101–111)
Creatinine, Ser: 0.67 mg/dL (ref 0.44–1.00)
GFR calc Af Amer: >60 mL/min (ref >60)
GFR calc non Af Amer: >60 mL/min (ref >60)
Glucose, Bld: 147 mg/dL — ABNORMAL HIGH (ref 65–99)
Potassium: 4.1 mmol/L (ref 3.5–5.1)
Sodium: 142 mmol/L (ref 135–145)

## 2014-11-07 LAB — CBC
HCT: 33 % — ABNORMAL LOW (ref 36.0–46.0)
Hemoglobin: 11 g/dL — ABNORMAL LOW (ref 12.0–15.0)
MCH: 30.9 pg (ref 26.0–34.0)
MCHC: 33.3 g/dL (ref 30.0–36.0)
MCV: 92.7 fL (ref 78.0–100.0)
Platelets: 347 10*3/uL (ref 150–400)
RBC: 3.56 MIL/uL — ABNORMAL LOW (ref 3.87–5.11)
RDW: 12.9 % (ref 11.5–15.5)
WBC: 7.3 10*3/uL (ref 4.0–10.5)

## 2014-11-07 LAB — GLUCOSE, CAPILLARY
Glucose-Capillary: 162 mg/dL — ABNORMAL HIGH (ref 65–99)
Glucose-Capillary: 260 mg/dL — ABNORMAL HIGH (ref 65–99)
Glucose-Capillary: 283 mg/dL — ABNORMAL HIGH (ref 65–99)
Glucose-Capillary: 68 mg/dL (ref 65–99)
Glucose-Capillary: 76 mg/dL (ref 65–99)
Glucose-Capillary: 77 mg/dL (ref 65–99)

## 2014-11-07 MED ORDER — INSULIN GLARGINE 100 UNIT/ML ~~LOC~~ SOLN
20.0000 [IU] | Freq: Every day | SUBCUTANEOUS | Status: DC
Start: 2014-11-07 — End: 2015-05-06

## 2014-11-07 MED ORDER — INSULIN GLARGINE 100 UNIT/ML ~~LOC~~ SOLN
20.0000 [IU] | Freq: Every day | SUBCUTANEOUS | Status: DC
  Administered 2014-11-07: 20 [IU] via SUBCUTANEOUS
  Filled 2014-11-07: qty 0.2

## 2014-11-07 MED ORDER — ACETAMINOPHEN 325 MG PO TABS
650.0000 mg | ORAL_TABLET | Freq: Four times a day (QID) | ORAL | Status: DC | PRN
  Administered 2014-11-07: 650 mg via ORAL
  Filled 2014-11-07: qty 2

## 2014-11-07 MED ORDER — KETOROLAC TROMETHAMINE 30 MG/ML IJ SOLN: 60.0000 mg | Freq: Once | INTRAMUSCULAR | Status: DC

## 2014-11-07 NOTE — Discharge Summary (Signed)
[facility] Discharge Summary  Patient name: Barbara Clark Medical record number: [id number] Date of birth: 1975-11-14 Age: 39 y.o. Gender: female Date of Admission: 11/06/2014  Date of Discharge: 11/07/2014 Admitting Physician: Dickie La, MD  Primary Care Provider: Andrena Mews, MD Consultants: none  Indication for Hospitalization: altered mental status and hyperglycemia  Discharge Diagnoses/Problem List:  Patient Active Problem List   Diagnosis Date Noted  . Headache 11/06/2014  . Altered mental status 11/06/2014  . 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 Hyperglycemia   . Cephalalgia   . Syncope and collapse   . Menorrhagia 04/27/2014  . Chronic leg pain 04/27/2014  . DM neuropathy, type I diabetes mellitus ([place]) 03/13/2014  . Right leg weakness 02/20/2014  . Foot swelling 02/20/2014  . Bilateral swelling of feet 08/04/2013  . Limb weakness 08/04/2013  . Jaw pain 05/11/2013  . Stuttering 02/02/2013  . Abnormality of gait 12/26/2012  . Ethmoidal sinusitis 12/13/2012  . Vaginal discharge 04/12/2012  . High risk sexual behavior 04/12/2012  . Foot pain, bilateral 03/14/2012  . TOBACCO USER 10/27/2008  . Diabetes mellitus type I ([place]) 03/18/2006  . Major depressive disorder, recurrent episode ([place]) 03/18/2006  . MIGRAINE, UNSPEC., W/O INTRACTABLE MIGRAINE 03/18/2006     Disposition: home  Discharge Condition: improved and stable  Discharge Exam: Gen: sleepy but arousable, oriented. Able to sit up in bed.  Eyes: not able to assess pupils as she is couldn't tolerate light Nares: clear, no erythema, swelling or congestion Oropharynx: clear, moist CV: RRR. S1 & S2 audible, no murmurs Resp: no apparent WOB, CTAB. Abd: +BS. Soft, NDNT, no rebound or guarding.  Ext: No edema or gross deformities. Neuro: sleepy, but arousable and oriented to self, person, place and month and year. CN 5, 7, 8, 11, 12 normal. Motor 5/5,  sensation intact.  Brief Hospital Course:  Barbara Clark is a 39 y.o. female presenting with altered mental status and hyperglycemia to 404. PMH is significant for T1DM, Major Depressive Disorder, Tobacco Abuse   Altered Mental Status/Headache: has hx of migraine headache. Also history of fall and hitting her head on 10/14. Head CT negative at that time. Afebrile. WBC, UA, CSF, UDS and ammonia within normal range. Physical exam negative for nuchal rigidity. Patient with elevated blood glucose of 404 on admission but trended to 282. K initially 5.3 but trended to 4.1. Lactic acid 2.07-->1.2 after fluid hydration.ABG normal. Patient has hx of multiple of episodes of being altered in the past with both elevated and low blood sugar.  On the day of discharge, patient continued to endorse frontal headache and photophobia. She looked sleepy but arousable although her mental status has improved from her presenation. She had no neurologic deficit. Although she has history of migraine, repeat CT head was ordered out of concern for missed cranial hemorrhage on initial CT on 10/10 after an episode of fall. However, repeat CT head came back negative and she was discharged home on tramadol 50 mg tid and tylenol PRN.   T1DM- Initially blood sugars elevated in the ED to 404. ABG and BMP not concerning for DKA. Blood glucose trended down and she became hypoglycemic to 68 the following day. She was given apple juice. Her meal time Novolog was held. Her blood glucose came up to 76, then 260. She was discharged on 20 units of Lantus daily and meal time Novolog. She was on 45 untis on admission. Recommend adjusting her insulin at follow up.  Diabetic  Neuropathy- not clear if this is contributing factor to her fall - Continue Gabapentin  - Continue home tramadol  - Consider Physical/Occupational therapy when able to participate  Other chronic conditions were stable  Issues for Follow Up:  1. AMS/Headache:  improved. Repeat head CT negative. Re-assess at follow up.    2. DM-1: reduced her home Lantus to 20 units from 45 units previously due to hypoglycemia. Consider adjusting as needed  Significant Procedures: Lumbar puncture  Significant Labs and Imaging:   Recent Labs Lab 11/02/14 1412 11/06/14 1243 11/07/14 0601  WBC 7.2 9.0 7.3  HGB 12.3 12.8 11.0*  HCT 36.8 39.6 33.0*  PLT 336 367 347    Recent Labs Lab 11/02/14 1412 11/06/14 1243 11/06/14 1333 11/07/14 0601  NA 138 136  --  142  K 4.2 5.3* 4.4 4.1  CL 109 101  --  108  CO2 26 23  --  26  GLUCOSE 213* 397*  --  147*  BUN 6 7  --  8  CREATININE 0.68 0.72  --  0.67  CALCIUM 8.6* 9.2  --  8.9  ALKPHOS  --   --  105  --   AST  --   --  14*  --   ALT  --   --  11*  --   ALBUMIN  --   --  3.3*  --     Results/Tests Pending at Time of Discharge: none  Discharge Medications:    Medication List    STOP taking these medications        Insulin Glargine 100 UNIT/ML Solostar Pen  Commonly known as:  LANTUS SOLOSTAR  Replaced by:  insulin glargine 100 UNIT/ML injection      TAKE these medications        insulin aspart 100 UNIT/ML FlexPen  Commonly known as:  NOVOLOG  Inject 16 Units into the skin 3 (three) times daily with meals.     insulin glargine 100 UNIT/ML injection  Commonly known as:  LANTUS  Inject 0.2 mLs (20 Units total) into the skin daily.     traMADol 50 MG tablet  Commonly known as:  ULTRAM  Take 1 tablet (50 mg total) by mouth every 8 (eight) hours as needed.        Discharge Instructions: Please refer to Patient Instructions section of EMR for full details.  Patient was counseled important signs and symptoms that should prompt return to medical care, changes in medications, dietary instructions, activity restrictions, and follow up appointments.   Follow-Up Appointments:     Follow-up Information    Follow up with Andrena Mews, MD On 11/13/2014.   Specialty:  Family Medicine   Why:   10:15 am   Contact information:   [place] [address] [phone]       Mercy Riding, MD 11/07/2014, 8:58 PM PGY-1, [place]

## 2014-11-07 NOTE — Care Management Note (Signed)
Case Management Note  Patient Details  Name: Barbara Clark MRN: [id number] Date of Birth: 03-07-1975  Subjective/Objective:                 Patient placed in observation with headache, infectious process ruled out. Has had similair episodes in the past and may be related to uncontrolled blood sugars per MD note. Patient is followed at the [facility] and has home health aid prior to admission.    Action/Plan:  No CM needs at this time.  Expected Discharge Date:                  Expected Discharge Plan:  Home/Self Care  In-House Referral:     Discharge planning Services  CM Consult  Post Acute Care Choice:    Choice offered to:     DME Arranged:    DME Agency:     HH Arranged:    [place] Agency:     Status of Service:  Completed, signed off  Medicare Important Message Given:    Date Medicare IM Given:    Medicare IM give by:    Date Additional Medicare IM Given:    Additional Medicare Important Message give by:     If discussed at [place] of Stay Meetings, dates discussed:    Additional Comments:  Carles Collet, RN 11/07/2014, 3:22 PM

## 2014-11-07 NOTE — Progress Notes (Signed)
Hypoglycemic Event  CBG: 68  Treatment: 15 GM carbohydrate snack  Symptoms: None  Follow-up CBG: Time:1241 CBG Result76  Possible Reasons for Event: Unknown  Comments/MD notified:Yes    Maliik Karner L

## 2014-11-07 NOTE — Progress Notes (Signed)
Nsg Discharge Note  Admit Date:  11/06/2014 Discharge date: 11/07/2014   Divya Munshi to be D/C'd Home per MD order.  AVS completed.  Copy for chart, and copy for patient signed, and dated. Patient/caregiver able to verbalize understanding.  Discharge Medication:   Medication List    STOP taking these medications        Insulin Glargine 100 UNIT/ML Solostar Pen  Commonly known as:  LANTUS SOLOSTAR  Replaced by:  insulin glargine 100 UNIT/ML injection      TAKE these medications        insulin aspart 100 UNIT/ML FlexPen  Commonly known as:  NOVOLOG  Inject 16 Units into the skin 3 (three) times daily with meals.     insulin glargine 100 UNIT/ML injection  Commonly known as:  LANTUS  Inject 0.2 mLs (20 Units total) into the skin daily.     traMADol 50 MG tablet  Commonly known as:  ULTRAM  Take 1 tablet (50 mg total) by mouth every 8 (eight) hours as needed.        Discharge Assessment: Filed Vitals:   11/07/14 1422  BP: 110/61  Pulse: 85  Temp: 98.6 F (37 C)  Resp: 16   Skin clean, dry and intact without evidence of skin break down, no evidence of skin tears noted. IV catheter discontinued intact. Site without signs and symptoms of complications - no redness or edema noted at insertion site, patient denies c/o pain - only slight tenderness at site.  Dressing with slight pressure applied.  D/c Instructions-Education: Discharge instructions given to patient/family with verbalized understanding. D/c education completed with patient/family including follow up instructions, medication list, d/c activities limitations if indicated, with other d/c instructions as indicated by MD - patient able to verbalize understanding, all questions fully answered. Also, educated patient about Lantus being reduced to 20 units daily.  Patient instructed to return to ED, call 911, or call MD for any changes in condition.  Patient escorted via [place], and D/C home via private  auto.  Dayle Points, RN 11/07/2014 6:05 PM

## 2014-11-07 NOTE — Progress Notes (Signed)
Family Medicine Teaching Service Daily Progress Note Intern Pager: [phone]  Patient name: Barbara Clark Medical record number: [id number] Date of birth: 1975-03-21 Age: 39 y.o. Gender: female  Primary Care Provider: Andrena Mews, MD Consultants: none  Code Status: full  Pt Overview and Major Events to Date:  10/10-admitted with AMS and hyperglycemia in the setting of recent fall  Assessment and Plan: Barbara Clark is a 39 y.o. female presenting with altered mental status . PMH is significant for T1DM, Major Depressive Disorder, Tobacco Abuse   Altered Mental Status/Headache: has hx of migraine headache. hx of fall and hitting her head on 10/14. Head CT negative at that time. Afebrile, with normal WBC, negative UA, negative CSF and exam negative for nuchal rigidity and negative Kernig's, making infectious process less likely. Patient's ammonia level within normal limits. UDS also negative for substances. Patient with elevated blood glucose of 404 on admission but trended to 282. K initially 5.3 but trended to 4.4 and lactic acid 2.07-->1.2 after fluid hydration, normal AG to 12,Ph 7.902 making a metabolic source of mental status change unlikely. CSF with elevated glucose to 169, protein 37, no pleocytosis or organism on gram stain. Patient has hx of multiple of episodes of being altered in the past with both elevated and low blood sugar. Hx also significant for migraine headache. Frontal headache and photophobia this morning. Sleepy but arousable. No other neurologic deficit - Will admit to observation, attending Dr. Nori Riis  - CT head - Continue to monitor - Continue tramadol - Continue tylenol prn  T1DM- Initially blood sugars elevated in the ED to 404>>147>77. K 5.3>>4.1 after IVF. PH 7.4, AG 12. Lactic aid 2.07 -> 1.2 after fluid repletion, BHA 2.01.Initial electrolyte abnormalities likely due to dehydration. - Decreased her Lantus to 20 units daily. 45 units at home - SSI, mild -  CBGs ACHS - KVO  Diabetic Neuropathy- not clear if this is contributing factor to her fall - Continue Gabapentin  - Continue home tramadol  - Consider Physical/Occupational therapy when able to participate  MDD:  - Continue home Zoloft and seroquel - if no improvement in AMS with complete normalization of electrolytes and blood sugars consider inpt Psych eval - Continue to monitor   Tobacco Abuse- Unable to assess how much pt currently smokes due to non compliance with exam and history taking, has been prescribed a nicotine patch in the past but unknown if patient still uses this - Consider nicotine patch, if becomes an issue  FEN/GI: Carb modified diet, MIVF 125 cc/hr, monitor I/Os Prophylaxis: Lovenox   Subjective:  Reports frontal headache. Says her pain is as if something is hitting her head. Pain intensity 5/10. Pain worse with light. Tramadol helped last night but she says she hasn't gotten another dose. Doesn't remember what happened yesterday. She regained her consciousness overnight.  Objective: Temp:  [98.2 F (36.8 C)-98.8 F (37.1 C)] 98.2 F (36.8 C) (10/19 0516) Pulse Rate:  [84-102] 84 (10/19 0516) Resp:  [13-24] 15 (10/19 0516) BP: (85-120)/(49-91) 85/49 mmHg (10/19 0516) SpO2:  [97 %-100 %] 99 % (10/19 0516) Weight:  [180 lb 1.9 oz (81.7 kg)] 180 lb 1.9 oz (81.7 kg) (10/18 1937) Physical Exam: Gen: sleepy but arousable, oriented. Able to sit up in bed.  Eyes: not able to assess pupils as she is couldn't tolerate light Nares: clear, no erythema, swelling or congestion Oropharynx: clear, moist CV: RRR. S1 & S2 audible, no murmurs Resp: no apparent WOB, CTAB. Abd: +BS. Soft, NDNT, no  rebound or guarding.  Ext: No edema or gross deformities. Neuro: sleepy, but arousable and oriented to self, person, place and month and year. CN 5, 7, 8, 11, 12 normal. Motor 5/5, sensation intact.  Laboratory:  Recent Labs Lab 11/02/14 1412 11/06/14 1243 11/07/14 0601   WBC 7.2 9.0 7.3  HGB 12.3 12.8 11.0*  HCT 36.8 39.6 33.0*  PLT 336 367 347    Recent Labs Lab 11/02/14 1412 11/06/14 1243 11/06/14 1333 11/07/14 0601  NA 138 136  --  142  K 4.2 5.3* 4.4 4.1  CL 109 101  --  108  CO2 26 23  --  26  BUN 6 7  --  8  CREATININE 0.68 0.72  --  0.67  CALCIUM 8.6* 9.2  --  8.9  PROT  --   --  6.5  --   BILITOT  --   --  0.5  --   ALKPHOS  --   --  105  --   ALT  --   --  11*  --   AST  --   --  14*  --   GLUCOSE 213* 397*  --  147*    Imaging/Diagnostic Tests: No results found.  Mercy Riding, MD 11/07/2014, 7:28 AM PGY-1, [place] Intern pager: [phone], text pages welcome

## 2014-11-07 NOTE — Discharge Instructions (Signed)
You were seen for change in mental status. All of your labs were normal. You also has a CT of her head which was normal Please follow with your PCP as well as your Psychiatrist If you have further questions or concerns please call the clinic     Follow-up Information    Follow up with Andrena Mews, MD On 11/13/2014.   Specialty:  Family Medicine   Why:  10:15 am   Contact information:   [place] [address] [phone]

## 2014-11-07 NOTE — Progress Notes (Signed)
Pharmacist Provided - Patient Medication Education Prior to Discharge   Barbara Clark is an 39 y.o. female who presented to [facility] on 11/06/2014 with a chief complaint of  Chief Complaint  Patient presents with  . Loss of Consciousness     [x]  Patient will be discharged with changes to previous medications   The following medications were discussed with the patient: Lantus, Novolog  Diabetes Medications: [x]  Yes    []  No  Medication Adherence Assessment: []  Excellent (no doses missed/week)      [x]  Good (1 dose missed/week)      []  Partial (2-3 doses missed/week)      []  Poor (>3 doses missed/week)  Barriers to Obtaining Medications: []  Yes [x]  No  Assessment: Barbara Clark is a 18yoF admitted 11/06/2014 with altered mental status and hyperglycemia. She is a known Type I diabetic and uses Lantus and Novolog at home. I discussed proper injection technique and the importance of cleaning the site with alcohol prior to injection. I reviewed the proper identification and management of hypoglycemia. She was encouraged to check her blood sugars at home, first thing in the morning and 2-hours after a meal. She stated she would attempt to do this upon discharge. Patient denies any problems obtaining her medications, although she did state that she has difficulty remembering to pick her prescriptions up. I educated her on potential options to help remind her when she is due for refills.  Barbara Clark did have some low blood sugars here in the hospital while on Lantus 20 units. Her home regimen is Lantus 45 units daily and Novolog 16 units TID with meals. Per RN, she ate a full breakfast this AM prior to receiving her dose. I recommended that we consider decreasing her Lantus to 20 units upon discharge due to her low glucose.   Time spent preparing for discharge counseling: 35 minutes Time spent counseling patient: 30 minutes  Barbara Clark, PharmD. Clinical Pharmacist Resident Pager:  [phone] 11/07/2014, 4:32 PM

## 2014-11-09 LAB — CSF CULTURE W GRAM STAIN

## 2014-11-09 LAB — CSF CULTURE: Culture: NO GROWTH

## 2014-11-13 ENCOUNTER — Encounter: Payer: Self-pay | Admitting: Family Medicine

## 2014-11-13 ENCOUNTER — Ambulatory Visit (INDEPENDENT_AMBULATORY_CARE_PROVIDER_SITE_OTHER): Payer: Medicaid Other | Admitting: Family Medicine

## 2014-11-13 VITALS — BP 109/65 | HR 97 | Temp 98.6°F | Wt 181.6 lb

## 2014-11-13 DIAGNOSIS — F339 Major depressive disorder, recurrent, unspecified: Secondary | ICD-10-CM

## 2014-11-13 DIAGNOSIS — E108 Type 1 diabetes mellitus with unspecified complications: Secondary | ICD-10-CM

## 2014-11-13 DIAGNOSIS — R42 Dizziness and giddiness: Secondary | ICD-10-CM | POA: Insufficient documentation

## 2014-11-13 DIAGNOSIS — R51 Headache: Secondary | ICD-10-CM

## 2014-11-13 DIAGNOSIS — R519 Headache, unspecified: Secondary | ICD-10-CM

## 2014-11-13 NOTE — Assessment & Plan Note (Signed)
I worry since this patient has gotten multiple change in the dose of her Lantus due to hospitalization in addition to her known compliance with recommendations. She was d/c home on Lantus 20 unit due to an episode of low glucose of 60. She was running higher than 400 on that regimen. For now I agree with continue previous home regimen since her CBG still runs in the 200-330 on that same regimen. Increase Novolog to 16 unit in the AM, 17 Unit PM and 17 unit night with meal. She is instructed to hold Insulin if glucose is less than 100. F/U in 2 wks for reassessment.

## 2014-11-13 NOTE — Patient Instructions (Signed)
It was nice seeing you today. I am sorry you still feel sick. So far all the test done had been fine. Please continue Tramadol as needed for headache. I will refer you to a headache specialist and see if there is anything else to be done. In the future if no improvement we can consider MRI of your brain. Please continue Lantus 20 unit in am and 25 in PM for your DM and Novolog 16 unit in am 17 unit in pm and 17 unit at night. Hold if glucose is less than 100. I will like to see you back in 2wks.

## 2014-11-13 NOTE — Progress Notes (Signed)
Subjective:     Patient ID: Barbara Clark, female   DOB: 1975-11-15, 39 y.o.   MRN: [id number]  HPI DM: now back on 45 unit qd taking 20 units in am and 25 in pm. 289 this morning. Since she left the hospital her glucose has been running high. She was d/c home on Lantus 20 units daily and her glucose was as high as 400 hence she went back on 45 unit daily with her Novolog of 16 unit TID. Even at this current dose she is still running high. Her home diet is appropriate per patient. Headache: Frontal headache to the back of her neck persisted.  This started since she fell about 2 week ago and then she had another fall 1 wk ago prior to hospitalization when she hit her head. She denies vomiting, but she feels nauseous. She takes Excedrin which helps a little bit. No change in vision but light bothers her. She is requesting MRI of the brain. Dizziness: no LOC, fell twice on Friday. She felt dizzy before falling. She has a home health aide who watches her 7 days a week for 18 hours per week but she stays with her more than 18 hrs. Appetite is poor due to stress. Depression:She went to the therapy today and took her off Zoloft and started her on new medications. Therapy is worried about her mental status due to a lot of stress at home. She stated her therapist mentioned her other health problem is due to depression and she is not happy about that. She feels there is something else going on with her. She stated she has no family support as expected.  She also mentioned she is unto go back to school due to her health in general and needed a letter for school.  Current Outpatient Prescriptions on File Prior to Visit  Medication Sig Dispense Refill  . insulin aspart (NOVOLOG) 100 UNIT/ML FlexPen Inject 16 Units into the skin 3 (three) times daily with meals. 15 mL 3  . insulin glargine (LANTUS) 100 UNIT/ML injection Inject 0.2 mLs (20 Units total) into the skin daily. 10 mL 11  . traMADol (ULTRAM) 50 MG tablet  Take 1 tablet (50 mg total) by mouth every 8 (eight) hours as needed. (Patient taking differently: Take 50 mg by mouth every 8 (eight) hours as needed. For leg pain) 90 tablet 1   No current facility-administered medications on file prior to visit.   Past Medical History  Diagnosis Date  . Diabetes mellitus     Type I  . Depression   . Anxiety   . HSV-2 (herpes simplex virus 2) infection 2012    serology       Review of Systems  Respiratory: Negative.   Cardiovascular: Negative.   Gastrointestinal: Negative.   Neurological: Positive for dizziness, weakness and headaches. Negative for tremors, seizures, syncope and speech difficulty.  All other systems reviewed and are negative.  Filed Vitals:   11/13/14 1021  BP: 109/65  Pulse: 97  Temp: 98.6 F (37 C)  TempSrc: Oral  Weight: 181 lb 9 oz (82.356 kg)       Objective:   Physical Exam  Constitutional: She is oriented to person, place, and time. She appears well-developed. No distress.  Cardiovascular: Normal rate, regular rhythm, normal heart sounds and intact distal pulses.   No murmur heard. Pulmonary/Chest: Effort normal and breath sounds normal. No respiratory distress. She has no wheezes.  Abdominal: Soft. Bowel sounds are normal. She exhibits no  distension and no mass. There is no tenderness. There is no guarding.  Musculoskeletal:  Ambulation and support with a rolling walker  Neurological: She is alert and oriented to person, place, and time. She displays normal reflexes. No cranial nerve deficit. She exhibits normal muscle tone.  Psychiatric: Judgment normal. Her mood appears not anxious. Her affect is not angry and not inappropriate. She is slowed and withdrawn. She is not actively hallucinating. Cognition and memory are normal. She exhibits a depressed mood. She expresses no homicidal and no suicidal ideation. She expresses no suicidal plans and no homicidal plans.  Speech slowed  Nursing note and vitals  reviewed.      Assessment:     DM1 Headache Dizziness Depression     Plan:     Check problem list.

## 2014-11-13 NOTE — Assessment & Plan Note (Signed)
Etiology unclear. Migraine, vs stress vs concussion from recent fall vs persistent headache s/p LP at the hospital. CT head done reviewed and was normal. LP report not suggestive of meningitis. Patient requesting MRI which I feel is not needed at this time. I referred her to neurologist for assessment. She is advised to call in few day if no improvement if worsening of her headache then will consider an MRI. She agreed with plan.

## 2014-11-13 NOTE — Assessment & Plan Note (Signed)
This is likely contributing significantly to her current condition. Unclear what new medications she was started on but she will come with her medication bottle the next visit. F/U with psychiatrist/Psychologist as scheduled. She seems compliant with her follow up. Currently not suicidal. We talked about her school and ability to focus on academics. I recommended she meet with her school counselor and if they need me to complete any form for her she can bring it to me. She agreed with plan.

## 2014-11-13 NOTE — Assessment & Plan Note (Signed)
This has been ongoing since last hospitalization for which she passed out. No recent LOC since she left the hospital. Lab reviewed and her hgb looks ok. Unclear why she is having her symptoms. Hypoglycemia less likely,she is mostly hyperglycemic. ?? Related to stress and anxiety. I recommended increase hydration and rest at home. Return precaution discussed.

## 2014-11-15 ENCOUNTER — Telehealth: Payer: Self-pay | Admitting: Family Medicine

## 2014-11-15 NOTE — Telephone Encounter (Signed)
Will forward to MD. Jazmin Hartsell,CMA  

## 2014-11-15 NOTE — Telephone Encounter (Signed)
pts caregiver came into office.  Counselor at [facility] stated there were no forms to give dr to have filled out.  Dr needs to write a letter stating why pt cannot return to school at this time. Pt will pick up letter when ready

## 2014-11-16 ENCOUNTER — Encounter: Payer: Self-pay | Admitting: Family Medicine

## 2014-11-16 NOTE — Telephone Encounter (Signed)
Spoke with patient and she is aware that letter is up front ready for pick up. Barbara Clark,CMA

## 2014-11-16 NOTE — Telephone Encounter (Signed)
Letter is completed on [employer]. Please print and put office stamp on it and leave up front for patient to pick up. Thanks.

## 2014-11-19 DIAGNOSIS — R739 Hyperglycemia, unspecified: Secondary | ICD-10-CM | POA: Insufficient documentation

## 2014-11-19 DIAGNOSIS — R55 Syncope and collapse: Secondary | ICD-10-CM | POA: Insufficient documentation

## 2014-11-26 ENCOUNTER — Other Ambulatory Visit: Payer: Self-pay | Admitting: *Deleted

## 2014-11-26 MED ORDER — INSULIN ASPART 100 UNIT/ML FLEXPEN: 16.0000 [IU] | PEN_INJECTOR | Freq: Three times a day (TID) | SUBCUTANEOUS | Status: DC

## 2014-11-26 MED ORDER — TRAMADOL HCL 50 MG PO TABS: 50.0000 mg | ORAL_TABLET | Freq: Three times a day (TID) | ORAL | Status: DC | PRN

## 2014-12-11 ENCOUNTER — Other Ambulatory Visit: Payer: Self-pay | Admitting: Family Medicine

## 2014-12-11 ENCOUNTER — Ambulatory Visit: Payer: Self-pay | Admitting: Family Medicine

## 2014-12-11 ENCOUNTER — Telehealth: Payer: Self-pay | Admitting: *Deleted

## 2014-12-11 MED ORDER — INSULIN ASPART 100 UNIT/ML FLEXPEN: PEN_INJECTOR | SUBCUTANEOUS | Status: DC

## 2014-12-11 NOTE — Telephone Encounter (Signed)
Patient had to reschedule her appointment today because she says her ride never showed up. Appointment rescheduled for 12/69 with PCP. Patient needs refill on novolog sent to [facility] in the meantime.

## 2014-12-11 NOTE — Telephone Encounter (Signed)
Pt was rescheduled for 12/28/14 with PCP. Please read below.  Barbara Clark, CMA.

## 2014-12-11 NOTE — Telephone Encounter (Signed)
Refill completed.

## 2014-12-21 ENCOUNTER — Encounter: Payer: Self-pay | Admitting: Neurology

## 2014-12-21 ENCOUNTER — Ambulatory Visit (INDEPENDENT_AMBULATORY_CARE_PROVIDER_SITE_OTHER): Payer: Medicaid Other | Admitting: Neurology

## 2014-12-21 VITALS — BP 124/68 | HR 86 | Ht 67.0 in | Wt 179.6 lb

## 2014-12-21 DIAGNOSIS — G43009 Migraine without aura, not intractable, without status migrainosus: Secondary | ICD-10-CM

## 2014-12-21 MED ORDER — SUMATRIPTAN SUCCINATE 100 MG PO TABS: ORAL_TABLET | ORAL | Status: DC

## 2014-12-21 MED ORDER — TOPIRAMATE 25 MG PO TABS: 25.0000 mg | ORAL_TABLET | Freq: Every day | ORAL | Status: DC

## 2014-12-21 NOTE — Patient Instructions (Signed)
Migraine Recommendations: 1.  Start topiramate 25mg  at bedtime.  Call in 4 weeks with update and we can adjust dose if needed.  Possible side effects include: impaired thinking, sedation, paresthesias (numbness and tingling) and weight loss.  It may cause dehydration and there is a small risk for kidney stones, so make sure to stay hydrated with water during the day.  There is also a very small risk for glaucoma, so if you notice any change in your vision while taking this medication, see an ophthalmologist.  There is also a very small risk of possible suicidal ideation, as it the case with all antiepileptic and antidepressant medications.  2.  Take sumatriptan 100mg  at earliest onset of headache.  May repeat dose once in 2 hours if needed.  Do not exceed two tablets in 24 hours. 3.  Limit use of pain relievers to no more than 2 days out of the week.  These medications include acetaminophen, ibuprofen, triptans and narcotics.  This will help reduce risk of rebound headaches. 4.  Be aware of common food triggers such as processed sweets, processed foods with nitrites (such as deli meat, hot dogs, sausages), foods with MSG, alcohol (such as wine), chocolate, certain cheeses, certain fruits (dried fruits, some citrus fruit), vinegar, diet soda. 4.  Avoid caffeine 5.  Routine exercise 6.  Proper sleep hygiene 7.  Stay adequately hydrated with water 8.  Keep a headache diary. 9.  Maintain proper stress management. 10.  Do not skip meals. 11.  Consider supplements:  Magnesium oxide 400mg  to 600mg  daily, riboflavin 400mg , Coenzyme Q 10 100mg  three times daily 12.  Stop smoking 13.  Follow up in 3 months.  CALL IN 4 WEEKS WITH UPDATE.

## 2014-12-21 NOTE — Progress Notes (Signed)
NEUROLOGY CONSULTATION NOTE  Barbara Clark MRN: [id number] DOB: 22-Nov-1975  Referring provider: Dr. Gwendlyn Deutscher Primary care provider: Dr. Gwendlyn Deutscher  Reason for consult:  migraine  HISTORY OF PRESENT ILLNESS: Barbara Clark is a 39 year old right-handed female with major depressive disorder, type 1 diabetes with neuropathy who presents for headache.  History obtained by patient, hospital and PCP notes.  Labs, MRI and CT of head reviewed.  Onset:  She has history of migraines all of her life.  They became almost daily since 11/06/14 after passing out and hitting her head.  She was found to have an elevated serum glucose of 404 and a UTI.  CT of head was negative.   Location:  Mid-frontal Quality:  stabbing Intensity:  10/10 Aura:  no Prodrome:  no Associated symptoms:  Nausea, photophobia, spinning, sometimes vomiting Duration:  Until she falls asleep Frequency:  5 to 6 days per week (usually occurs at night) Triggers/exacerbating factors:  unknown Relieving factors:  Sleep  Activity:  Cannot function  Past abortive medication:  none Past preventative medication:  none Other past therapy:  none  Current abortive medication:  Excedrin (efficacy variable), Tylenol PM (helps her fall asleep) Antihypertensive medications:  none Antidepressant medications:  Wellbutrin XL 150mg  daily Anticonvulsant medications:  Lyrica 50mg  three times daily Vitamins/Herbal/Supplements:  none Other therapy:  none Other medication:  Seroquel 50mg  at bedtime, Tramadol 50mg , insulin  Caffeine:  tea Alcohol:  no Smoker:  yes Diet:  Drinks a lot of water Exercise:  Not routine.  Limited due to leg weakness Depression/stress:  Significant stress and depression Sleep hygiene:  Poor unless uses sleep aid Family history of headache:  No  She has history of depression.  In 2014, she was hospitalized after passing out.  When she woke up, she was unable to walk.  Workup, including MRI of brain and plain films  of lower spine were unremarkable.  She was diagnosed with conversion disorder.  She continues to ambulate with cane and sometimes a walker to this day.  She reports continued weakness in legs.  PAST MEDICAL HISTORY: Past Medical History  Diagnosis Date  . Diabetes mellitus     Type I  . Depression   . Anxiety   . HSV-2 (herpes simplex virus 2) infection 2012    serology    PAST SURGICAL HISTORY: Past Surgical History  Procedure Laterality Date  . Tubal ligation      MEDICATIONS: Current Outpatient Prescriptions on File Prior to Visit  Medication Sig Dispense Refill  . insulin aspart (NOVOLOG) 100 UNIT/ML FlexPen Inject 16 unit [place] in morning, 17 unit [place] in the afternoon and 17 units [place] at night. Each with meal. Hold if glucose is less than100 15 mL 3  . insulin glargine (LANTUS) 100 UNIT/ML injection Inject 0.2 mLs (20 Units total) into the skin daily. 10 mL 11  . traMADol (ULTRAM) 50 MG tablet Take 1 tablet (50 mg total) by mouth every 8 (eight) hours as needed. For leg pain 90 tablet 2   No current facility-administered medications on file prior to visit.    ALLERGIES: No Known Allergies  FAMILY HISTORY: Family History  Problem Relation Age of Onset  . Diabetes Mother   . Hypertension Mother   . Diabetes Maternal Grandmother     SOCIAL HISTORY: Social History   Social History  . Marital Status: Single    Spouse Name: N/A  . Number of Children: N/A  . Years of Education: N/A   Occupational  History  . Not on file.   Social History Main Topics  . Smoking status: Current Every Day Smoker -- 1.00 packs/day    Types: Cigarettes  . Smokeless tobacco: Never Used     Comment: trying to quit  . Alcohol Use: 0.0 oz/week    0 Standard drinks or equivalent per week     Comment: rare but on occasion  . Drug Use: No  . Sexual Activity: Yes    Birth Control/ Protection: Surgical   Other Topics Concern  . Not on file   Social History Narrative   Pt lives with her  two children. Has some college.     REVIEW OF SYSTEMS: Constitutional: No fevers, chills, or sweats, no generalized fatigue, change in appetite Eyes: No visual changes, double vision, eye pain Ear, nose and throat: No hearing loss, ear pain, nasal congestion, sore throat Cardiovascular: No chest pain, palpitations Respiratory:  No shortness of breath at rest or with exertion, wheezes GastrointestinaI: No nausea, vomiting, diarrhea, abdominal pain, fecal incontinence Genitourinary:  No dysuria, urinary retention or frequency Musculoskeletal:  No neck pain, back pain Integumentary: No rash, pruritus, skin lesions Neurological: as above Psychiatric: depression, insomnia Endocrine: No palpitations, fatigue, diaphoresis, mood swings, change in appetite, change in weight, increased thirst Hematologic/Lymphatic:  No anemia, purpura, petechiae. Allergic/Immunologic: no itchy/runny eyes, nasal congestion, recent allergic reactions, rashes  PHYSICAL EXAM: Filed Vitals:   12/21/14 0955  BP: 124/68  Pulse: 86   General: No acute distress.  Head:  Normocephalic/atraumatic Eyes:  fundi unremarkable, without vessel changes, exudates, hemorrhages or papilledema. Neck: supple, no paraspinal tenderness, full range of motion Back: No paraspinal tenderness Heart: regular rate and rhythm Lungs: Clear to auscultation bilaterally. Vascular: No carotid bruits. Neurological Exam: Mental status: alert and oriented to person, place, and time, recent and remote memory intact, fund of knowledge intact, attention and concentration intact, speech fluent and not dysarthric, language intact. Cranial nerves: CN I: not tested CN II: pupils equal, round and reactive to light, visual fields intact, fundi unremarkable, without vessel changes, exudates, hemorrhages or papilledema. CN III, IV, VI:  full range of motion, no nystagmus, no ptosis CN V: facial sensation intact CN VII: upper and lower face symmetric CN  VIII: hearing intact CN IX, X: gag intact, uvula midline CN XI: sternocleidomastoid and trapezius muscles intact CN XII: tongue midline Bulk & Tone: normal, no fasciculations. Motor:  5-/5 left hip flexion, no effort with right quadriceps, hamstrings or ankle dorsiflexion/plantar flexion.  Otherwise, 5/5. Sensation:  Decreased pinprick sensation in right lower extremity.  Mildly reduced vibration sensation in lower extremities. Deep Tendon Reflexes:  2+ throughout, toes downgoing. Finger to nose testing:  Without dysmetria.  Heel to shin:  Without dysmetria.  Gait:  antalgic.  Able to turn but unable to tandem walk. Romberg positive.  IMPRESSION: Migraine without aura  PLAN: 1.  Start topiramate 25mg  at bedtime.  Side effects discussed 2.  Sumatriptan 100mg  for abortive therapy 3.  Discussed lifestyle modification such as smoking cessation 4.  She will call in 4 weeks with update.  Follow up in 3 months.  Thank you for allowing me to take part in the care of this patient.  Metta Clines, DO  CC:  Andrena Mews, MD

## 2014-12-28 ENCOUNTER — Ambulatory Visit (INDEPENDENT_AMBULATORY_CARE_PROVIDER_SITE_OTHER): Payer: Medicaid Other | Admitting: Family Medicine

## 2014-12-28 ENCOUNTER — Encounter: Payer: Self-pay | Admitting: Family Medicine

## 2014-12-28 VITALS — BP 135/87 | HR 104 | Temp 98.4°F | Ht 67.0 in | Wt 179.0 lb

## 2014-12-28 DIAGNOSIS — E108 Type 1 diabetes mellitus with unspecified complications: Secondary | ICD-10-CM | POA: Diagnosis not present

## 2014-12-28 DIAGNOSIS — G43909 Migraine, unspecified, not intractable, without status migrainosus: Secondary | ICD-10-CM | POA: Diagnosis not present

## 2014-12-28 DIAGNOSIS — E1040 Type 1 diabetes mellitus with diabetic neuropathy, unspecified: Secondary | ICD-10-CM | POA: Diagnosis not present

## 2014-12-28 DIAGNOSIS — R29898 Other symptoms and signs involving the musculoskeletal system: Secondary | ICD-10-CM | POA: Diagnosis not present

## 2014-12-28 LAB — GLUCOSE, CAPILLARY: Glucose-Capillary: 104 mg/dL — ABNORMAL HIGH (ref 65–99)

## 2014-12-28 LAB — POCT GLYCOSYLATED HEMOGLOBIN (HGB A1C): Hemoglobin A1C: 8.4

## 2014-12-28 NOTE — Assessment & Plan Note (Signed)
Pain and weakness seems to be chronic and recurrent. She is compliant with her home exercise. I recommended Neuro evaluation for her intermittent numbness. I wonder if it is related to DM neuropathy. She is already on Lyrica and we will continue this. She stated she will call her neurologist's office for follow-up and assessment. Return precaution discussed. Continue home exercise. Fall precaution discussed.

## 2014-12-28 NOTE — Progress Notes (Signed)
Subjective:     Patient ID: Barbara Clark, female   DOB: [birth year], 39 y.o.   MRN: [id number]  HPI DM2: Patient is here for follow up. She is currently on Lantus 45 units qhs and Novolog 16 units TID instead of 16 unit in AM and 17 units during lunch time and dinner time. Her CBG ranges from 90-342. She denies hypoglycemic episode. She asked about getting insulin pump at some point. Leg weakness: c/o right leg weakness on and off. She feels better today, she has been doing some home exercise. She had to use walker over last weekend. She is still using her walking cane. C/O associated pins and needle sensation. Denies numbness currently but this goes on and off as well, she has feet swelling sometimes. She fell twice last week.  Migraine headache: Patient stated she is uncertain if she is feeling better on her new medication but she has been getting good rest lately. She denies headache today. Feels well otherwise.  Current Outpatient Prescriptions on File Prior to Visit  Medication Sig Dispense Refill  . buPROPion (WELLBUTRIN XL) 150 MG 24 hr tablet Take 150 mg by mouth daily.    . insulin aspart (NOVOLOG) 100 UNIT/ML FlexPen Inject 16 unit [place] in morning, 17 unit [place] in the afternoon and 17 units [place] at night. Each with meal. Hold if glucose is less than100 15 mL 3  . insulin glargine (LANTUS) 100 UNIT/ML injection Inject 0.2 mLs (20 Units total) into the skin daily. (Patient taking differently: Inject 45 Units into the skin daily. ) 10 mL 11  . pregabalin (LYRICA) 50 MG capsule Take 50 mg by mouth 3 (three) times daily.    . QUEtiapine (SEROQUEL) 100 MG tablet Take 100 mg by mouth at bedtime.    . SUMAtriptan (IMITREX) 100 MG tablet Take 1tablet at earliest onset of headache.  May repeat once in 2 hours if headache persists or recurs.  Do not exceed 2 tablets in 24 hrs 10 tablet 2  . topiramate (TOPAMAX) 25 MG tablet Take 1 tablet (25 mg total) by mouth at bedtime. 30 tablet 0  . traMADol (ULTRAM) 50  MG tablet Take 1 tablet (50 mg total) by mouth every 8 (eight) hours as needed. For leg pain 90 tablet 2   No current facility-administered medications on file prior to visit.   Past Medical History  Diagnosis Date  . Diabetes mellitus     Type I  . Depression   . Anxiety   . HSV-2 (herpes simplex virus 2) infection 2012    serology     Review of Systems  Respiratory: Negative.   Cardiovascular: Negative.   Gastrointestinal: Negative.   Musculoskeletal:       Right lower limb weakness and intermittent numbness  Neurological: Negative for dizziness, syncope, speech difficulty and headaches.  All other systems reviewed and are negative.  Filed Vitals:   12/28/14 1105  BP: 135/87  Pulse: 104  Temp: 98.4 F (36.9 C)  TempSrc: Oral  Height: 5\' 7"  (1.702 m)  Weight: 179 lb (81.194 kg)       Objective:   Physical Exam  Constitutional: She is oriented to person, place, and time. She appears well-developed. No distress.  Cardiovascular: Normal rate, regular rhythm, normal heart sounds and intact distal pulses.   No murmur heard. Pulmonary/Chest: Effort normal and breath sounds normal. No respiratory distress. She has no wheezes. She exhibits no tenderness.  Abdominal: Soft. Bowel sounds are normal. She exhibits no distension  and no mass. There is no tenderness.  Musculoskeletal: Normal range of motion. She exhibits no edema.  Neurological: She is alert and oriented to person, place, and time. She has normal reflexes. No cranial nerve deficit.  Endorsed reduced sensation of her right lower limb and later she stated she could feel something.  Nursing note and vitals reviewed.      Assessment:     DM2 Leg weakness Migraine headache     Plan:     Check problem list.

## 2014-12-28 NOTE — Assessment & Plan Note (Signed)
Stable on Topamax. F/U neuro as instructed.

## 2014-12-28 NOTE — Assessment & Plan Note (Signed)
A1C improved just a little today. She may continue Latus 45 unit Qd. Increase Novolog to 17 unit with each meal TID. Continue home glucose monitoring and hold Novolog if less than 100. F/U in 3 months for reassessment. She will discuss referral to endocrinologist then for insulin pump.

## 2014-12-28 NOTE — Patient Instructions (Signed)
It was nice seeing you. Your A1C improved just a little but it is still not at goal. Continue lantus 45 units qhs. Take Novolog 17 unit TID with each meal. Continue to monitor your glucose at home and if it is less than 100, hold your Novolog. Continue home physical exercise for your right leg. Also consider following up with the neurologist about this. I will see you back in 3 months.

## 2014-12-29 ENCOUNTER — Encounter (HOSPITAL_COMMUNITY): Payer: Self-pay | Admitting: Emergency Medicine

## 2014-12-29 ENCOUNTER — Emergency Department (HOSPITAL_COMMUNITY)
Admission: EM | Admit: 2014-12-29 | Discharge: 2014-12-29 | Disposition: A | Discharge status: Home / Self Care | Payer: Medicaid Other | Attending: Emergency Medicine | Admitting: Emergency Medicine

## 2014-12-29 DIAGNOSIS — K029 Dental caries, unspecified: Secondary | ICD-10-CM | POA: Diagnosis not present

## 2014-12-29 DIAGNOSIS — F329 Major depressive disorder, single episode, unspecified: Secondary | ICD-10-CM | POA: Insufficient documentation

## 2014-12-29 DIAGNOSIS — Z8619 Personal history of other infectious and parasitic diseases: Secondary | ICD-10-CM | POA: Diagnosis not present

## 2014-12-29 DIAGNOSIS — E109 Type 1 diabetes mellitus without complications: Secondary | ICD-10-CM | POA: Diagnosis not present

## 2014-12-29 DIAGNOSIS — Z794 Long term (current) use of insulin: Secondary | ICD-10-CM | POA: Diagnosis not present

## 2014-12-29 DIAGNOSIS — Z79899 Other long term (current) drug therapy: Secondary | ICD-10-CM | POA: Diagnosis not present

## 2014-12-29 DIAGNOSIS — H9201 Otalgia, right ear: Secondary | ICD-10-CM | POA: Diagnosis not present

## 2014-12-29 DIAGNOSIS — R110 Nausea: Secondary | ICD-10-CM | POA: Diagnosis not present

## 2014-12-29 DIAGNOSIS — K0381 Cracked tooth: Secondary | ICD-10-CM | POA: Insufficient documentation

## 2014-12-29 DIAGNOSIS — F17210 Nicotine dependence, cigarettes, uncomplicated: Secondary | ICD-10-CM | POA: Insufficient documentation

## 2014-12-29 DIAGNOSIS — F419 Anxiety disorder, unspecified: Secondary | ICD-10-CM | POA: Diagnosis not present

## 2014-12-29 DIAGNOSIS — K0889 Other specified disorders of teeth and supporting structures: Secondary | ICD-10-CM | POA: Diagnosis present

## 2014-12-29 MED ORDER — OXYCODONE-ACETAMINOPHEN 5-325 MG PO TABS
2.0000 | ORAL_TABLET | Freq: Once | ORAL | Status: AC
Start: 2014-12-29 — End: 2014-12-29
  Administered 2014-12-29: 2 via ORAL
  Filled 2014-12-29: qty 2

## 2014-12-29 MED ORDER — PENICILLIN V POTASSIUM 500 MG PO TABS
500.0000 mg | ORAL_TABLET | Freq: Once | ORAL | Status: AC
  Administered 2014-12-29: 500 mg via ORAL
  Filled 2014-12-29: qty 1

## 2014-12-29 MED ORDER — PENICILLIN V POTASSIUM 500 MG PO TABS: 500.0000 mg | ORAL_TABLET | Freq: Four times a day (QID) | ORAL | Status: AC

## 2014-12-29 MED ORDER — TRAMADOL HCL 50 MG PO TABS: 50.0000 mg | ORAL_TABLET | Freq: Four times a day (QID) | ORAL | Status: DC | PRN

## 2014-12-29 MED ORDER — BUPIVACAINE-EPINEPHRINE (PF) 0.5% -1:200000 IJ SOLN
1.8000 mL | Freq: Once | INTRAMUSCULAR | Status: DC
  Filled 2014-12-29: qty 1.8

## 2014-12-29 NOTE — ED Notes (Signed)
Patient states she took 2 Seroquel this morning to treat her dental pain.

## 2014-12-29 NOTE — Discharge Instructions (Signed)
Dental Pain  ° ° °Dental pain may be caused by many things, including:  °Tooth decay (cavities or caries). Cavities expose the nerve of your tooth to air and hot or cold temperatures. This can cause pain or discomfort.  °Abscess or infection. A dental abscess is a collection of infected pus from a bacterial infection in the inner part of the tooth (pulp). It usually occurs at the end of the tooth's root.  °Injury.  °An unknown reason (idiopathic). °Your pain may be mild or severe. It may only occur when:  °You are chewing.  °You are exposed to hot or cold temperature.  °You are eating or drinking sugary foods or beverages, such as soda or candy. °Your pain may also be constant.  °HOME CARE INSTRUCTIONS  °Watch your dental pain for any changes. The following actions may help to lessen any discomfort that you are feeling:  °Take medicines only as directed by your dentist.  °If you were prescribed an antibiotic medicine, finish all of it even if you start to feel better.  °Keep all follow-up visits as directed by your dentist. This is important.  °Do not apply heat to the outside of your face.  °Rinse your mouth or gargle with salt water if directed by your dentist. This helps with pain and swelling.  °You can make salt water by adding ¼ tsp of salt to 1 cup of warm water. °Apply ice to the painful area of your face:  °Put ice in a plastic bag.  °Place a towel between your skin and the bag.  °Leave the ice on for 20 minutes, 2-3 times per day. °Avoid foods or drinks that cause you pain, such as:  °Very hot or very cold foods or drinks.  °Sweet or sugary foods or drinks. °SEEK MEDICAL CARE IF:  °Your pain is not controlled with medicines.  °Your symptoms are worse.  °You have new symptoms. °SEEK IMMEDIATE MEDICAL CARE IF:  °You are unable to open your mouth.  °You are having trouble breathing or swallowing.  °You have a fever.  °Your face, neck, or jaw is swollen. °This information is not intended to replace advice  given to you by your health care provider. Make sure you discuss any questions you have with your health care provider.  °Document Released: 01/05/2005 Document Revised: 05/22/2014 Document Reviewed: 01/01/2014  °Elsevier Interactive Patient Education ©2016 Elsevier Inc.  ° ° °Emergency Department Resource Guide °1) Find a Doctor and Pay Out of Pocket °Although you won't have to find out who is covered by your insurance plan, it is a good idea to ask around and get recommendations. You will then need to call the office and see if the doctor you have chosen will accept you as a new patient and what types of options they offer for patients who are self-pay. Some doctors offer discounts or will set up payment plans for their patients who do not have insurance, but you will need to ask so you aren't surprised when you get to your appointment. ° °2) Contact Your Local Health Department °Not all health departments have doctors that can see patients for sick visits, but many do, so it is worth a call to see if yours does. If you don't know where your local health department is, you can check in your phone book. The CDC also has a tool to help you locate your state's health department, and many state websites also have listings of all of their local health departments. ° °  3) Find a Walk-in Clinic °If your illness is not likely to be very severe or complicated, you may want to try a walk in clinic. These are popping up all over the country in pharmacies, drugstores, and shopping centers. They're usually staffed by nurse practitioners or physician assistants that have been trained to treat common illnesses and complaints. They're usually fairly quick and inexpensive. However, if you have serious medical issues or chronic medical problems, these are probably not your best option. ° °No Primary Care Doctor: °- Call [facility] at  832-8000 - they can help you locate a primary care doctor that  accepts your insurance,  provides certain services, etc. °- Physician Referral Service- 1-800-533-3463 ° °Chronic Pain Problems: °Organization         Address  Phone   Notes  °[facility]  (336) 297-2271 Patients need to be referred by their primary care doctor.  ° °Medication Assistance: °Organization         Address  Phone   Notes  °[place] [facility] [address] °[place], [place] [address] (336) 641-8030 --Must be a resident of [place] °-- Must have NO insurance coverage whatsoever (no Medicaid/ Medicare, etc.) °-- The pt. MUST have a primary care doctor that directs their care regularly and follows them in the community °  °[facility]  (866) 331-1348   °[employer]  (888) 892-1162   ° °Agencies that provide inexpensive medical care: °Organization         Address  Phone   Notes  °[facility]  (336) 832-8035   °[facility]    (336) 832-7272   °[facility] [address] °[place], [place] [address] (336) 832-4777   °[facility] [address], °[place] (336) 271-4999   °[facility]    (336) 373-0678   °[facility]    (336) 272-1050   °[facility] ° [address], [place] Phone:  (336) 832-4444, Fax:  (336) 832-4440 Hours of Operation:  9 am - 6 pm, M-F.  Also accepts Medicaid/Medicare and self-pay.  °[facility] ° [address], [place] Phone: (336) 832-3150, Fax: (336) 832-3151. Hours of Operation:  8:30 am - 5:30 pm, M-F.  Also accepts Medicaid and self-pay.  °[facility] [place] [address], [place] Phone: (336) 878-6027   °[facility] [address], [place], [place] (336)723-1848, Ext. 123 Mondays & Thursdays: 7-9 AM.  First 15 patients are seen on a first come, first serve basis. °  ° °Medicaid-accepting [place] Providers: ° °Organization         Address  Phone    Notes  °[facility] [address], [place] (336) 641-2100 Also accepts self-pay patients.  °[facility] [address], [place] ° (336) 856-9996   °[facility] [address], [place] (336) 288-8857   °[facility] [address] [place] [address], [place] (336) 299-7000   °Veita Bland [address],   ° (336) 373-1557 Only accepts [place] Access Medicaid patients after they have their name applied to their card.  ° °Self-Pay (no insurance) in [place]: ° °Organization         Address  Phone   Notes  °Sickle Cell Patients, [facility] [address], [place] (336) 832-1970   °[facility] [address], [place] (336) 832-4400   ° [facility] ° [address], [place] (336) 992-4800   °[facility]/Dr. Osei-Bonsu ° [address] [place] [address], [place] or [address], [place] (336) 841-8500 Phone number for both [place] and [place] locations is the same.  °[facility] [address], [place] (336) 299-0000   °[facility] [address] [place] [address], [place] or [address] (336) 852-7530 °(336) 878-2260   °[facility] [address], [place] (336) 350-1642, phone; (336) 294-5005, fax Sees patients 1st and 3rd Saturday of every month.  Must not qualify for public or private insurance (i.e. Medicaid, Medicare, [place] Health Choice, Veterans' Benefits) • Household income should be no more than 200% of the poverty level •The clinic cannot treat you if you are pregnant or think you are pregnant • Sexually transmitted diseases are not treated at the clinic.  ° ° °Dental Care: °Organization         Address  Phone  Notes  °[facility] [facility] [address], [place] (336)  641-6152 Accepts children up to age 21 who are enrolled in Medicaid or [place] Health Choice; pregnant women with a Medicaid card; and children who have applied for Medicaid or [place] Health Choice, but were declined, whose parents can pay a reduced fee at time of service.  °[facility] [place]  [address], [place] (336) 641-7733 Accepts children up to age 21 who are enrolled in Medicaid or [place] Health Choice; pregnant women with a Medicaid card; and children who have applied for Medicaid or [place] Health Choice, but were declined, whose parents can pay a reduced fee at time of service.  °[facility] ° [address], [place] (336) 641-4533 Patients are seen by appointment only. Walk-ins are not accepted. [facility] will see patients 18 years of age and older. °Monday - Tuesday (8am-5pm) °Most Wednesdays (8:30-5pm) °$30 per visit, cash only  °[facility] ° [address], [place] (336) 641-4533 Patients are seen by appointment only. Walk-ins are not accepted. [facility] will see patients 18 years of age and older. °One Wednesday Evening (Monthly: Volunteer Based).  $30 per visit, cash only  °[facility]  (919) 537-3737 for adults; Children under age 4, call Graduate Pediatric Dentistry at (919) [phone]. Children aged 4-14, please call (919) [phone] to request a pediatric application. ° Dental services are provided in all areas of dental care including fillings, crowns and bridges, complete and partial dentures, implants, gum treatment, root canals, and extractions. Preventive care is also provided. Treatment is provided to both adults and children. °Patients are selected via a lottery and there is often a waiting list. °  °[facility] [address], °[place] ° (336) 763-8833 [email or web address] °  °[facility] [address], [place], [place] (336)723-1848, Ext.  123 Second and Fourth Thursday of each month, opens at 6:30 AM; Clinic ends at 9 AM.  Patients are seen on a first-come first-served basis, and a limited number are seen during each clinic.  ° °[facility] ° [address], [place], [place] (336) 723-7904   Eligibility Requirements °You must have lived in [place],   [place], or [place] counties for at least the last three months. °  You cannot be eligible for state or federal sponsored healthcare insurance, including Veterans Administration, Medicaid, or Medicare. °  You generally cannot be eligible for healthcare insurance through your employer.  °  How to apply: °Eligibility screenings are held every Tuesday and Wednesday afternoon from 1:00 pm until 4:00 pm. You do not need an appointment for the interview!  °[facility] [address], [place], [place] 336-631-2330   °[facility]  336-342-8273   °[facility]  336-703-3100   °[facility]  336-570-6415   ° °Behavioral Health Resources in the Community: °Intensive Outpatient Programs °Organization         Address  Phone  Notes  °[facility] [address], [place], [place] 336-878-6098   °[facility] [address], [place], [place] 336-832-9800   °[facility] [address], [place], [place] ° 336-882-2125   °[facility] [address],  °[place], [place] 1-800-853-5163 or 336-641-4981   °Substance Abuse Resources °Organization         Address  Phone  Notes  °[facility]  336-882-2125   °[facility]  336-784-9470   °[facility]  336-285-9073   °[facility]  336-845-3988   °Residential & Outpatient Substance Abuse Program  1-800-659-3381   °Psychological Services °Organization         Address  Phone  Notes  °[facility]  336- 832-9600   °[facility]  336- 378-7881   °[place] [address], [place] 1-800-853-5163 or 336-641-4981   ° °Mobile Crisis Teams °Organization         Address  Phone  Notes  °[facility], Mobile Crisis Care Unit  1-877-626-1772   °Assertive °[facility] ° [address] [place], [place] 336-834-9664   °Sharon DeEsch [address] °[place] 336-554-5454   ° °Self-Help/Support Groups °Organization         Address  Phone             Notes  °[facility] - variety of support groups  336- 373-1402 Call for more information  °Narcotics Anonymous (NA), [facility] [address], °[place]  2 meetings at this location  ° °Residential Treatment Programs °Organization         Address  Phone  Notes  °[facility] [address],    °[place]  1-866-801-8205   °[facility] ° [address], [place], [place] 704-293-8524   °[facility] [address], [place] 336-845-3988 Admissions: 8am-3pm M-F  °[facility] [address]    °[place], [place] 336-841-1104   °[facility] [address], [place], [place] 336-379-7146   °[facility] [address]  °[place], [place] 336-285-9073   °[facility] - Intensive Outpatient [address], [place], [place] 336-852-3033   °[facility] [address]  °[place], [place] 1-877-615-2722 or 336-784-9470   °[facility] [address] [place], [place] 336-227-7417 Accepts Medicaid  °[facility] [address]  ° [place] 1-800-659-3381 Substance Abuse/Addiction Treatment  ° °[place] Behavioral Health Resources °Organization         Address  Phone  Notes  °[facility]  (888) 581-9988   °Julie Brannon, PhD [address] [place], [place]   (336) 349-5553 or (336) 951-0000   °[facility]   [address] °[place], [place] (336) 349-4454   °[facility] [address], [place], [place] (336) 342-8316 Insurance/Medicaid/sponsorship through [facility]  °[facility] [address]                                    [place], [place] (336) 342-8316 Therapy/tele-psych/case  °[facility] [address]  ° [place], [place] (336) 349-2233    °Dr. Arfeen  (336) 349-4544   °[facility] [place]  [employer] [place] [facility] 1) [address], [place] °2) [address], [place] °3)  [address], [place] (336) 349-3220 °(336) 342-7768 ° °(336) 342-8140   °[place] [facility] (336) 342-1394 or (336) 342-3537 (After Hours)    ° ° ° °

## 2014-12-29 NOTE — ED Provider Notes (Signed)
CSN: [id number]     Arrival date & time 12/29/14  1203 History  By signing my name below, I, Soijett Blue, attest that this documentation has been prepared under the direction and in the presence of Delsa Grana, PA-C Electronically Signed: Soijett Blue, ED Scribe. 12/29/2014. 1:37 PM.   Chief Complaint  Patient presents with  . Dental Pain      The history is provided by the patient. No language interpreter was used.    Barbara Clark is a 39 y.o. female with a medical hx of DM who presents to the Emergency Department complaining of upper right posterior dental pain onset 2 weeks ago worsening today. She notes that her right upper posterior dental pain is radiating to her right ear. She reports that she has an appointment at a dentist office on Monday, 12/31/2014 for her broken tooth on her upper right posterior jaw. She states that she is here in the ED today due to there being an increased amount of pain that the tooth is causing her. Denies recent abx use. She states that she is having associated symptoms of nausea. She states that she has tried percocet given in the ED and at home orajel and 0.5 seroquel tablet last night with no relief for her symptoms.  She denies fever, chills, trismus, facial swelling, gum swelling/bleeding/drainage, and any other symptoms. Pt denies allergies to any medications.   Past Medical History  Diagnosis Date  . Diabetes mellitus     Type I  . Depression   . Anxiety   . HSV-2 (herpes simplex virus 2) infection 2012    serology   Past Surgical History  Procedure Laterality Date  . Tubal ligation     Family History  Problem Relation Age of Onset  . Diabetes Mother   . Hypertension Mother   . Diabetes Maternal Grandmother    Social History  Substance Use Topics  . Smoking status: Current Every Day Smoker -- 1.00 packs/day    Types: Cigarettes  . Smokeless tobacco: Never Used     Comment: trying to quit  . Alcohol Use: 0.0 oz/week    0 Standard  drinks or equivalent per week     Comment: rare but on occasion   OB History    Gravida Para Term Preterm AB TAB SAB Ectopic Multiple Living   3 3 3       3      Review of Systems  Constitutional: Negative.  Negative for fever and chills.  HENT: Positive for dental problem and ear pain. Negative for facial swelling, sore throat, tinnitus, trouble swallowing and voice change.        No gum swelling/bleeding/discharge  Eyes: Negative.   Respiratory: Negative.   Cardiovascular: Negative.   Gastrointestinal: Positive for nausea. Negative for vomiting, abdominal pain, diarrhea and constipation.  Neurological: Negative.   Hematological: Negative.   Psychiatric/Behavioral: Negative.   All other systems reviewed and are negative.    Allergies  Review of patient's allergies indicates no known allergies.  Home Medications   Prior to Admission medications   Medication Sig Start Date End Date Taking? Authorizing Provider  buPROPion (WELLBUTRIN XL) 150 MG 24 hr tablet Take 150 mg by mouth daily.    Historical Provider, MD  insulin aspart (NOVOLOG) 100 UNIT/ML FlexPen Inject 16 unit Spur in morning, 17 unit [place] in the afternoon and 17 units  at night. Each with meal. Hold if glucose is less than100 12/11/14   [name], MD  insulin glargine (LANTUS) 100 UNIT/ML injection Inject 0.2 mLs (20 Units total) into the skin daily. Patient taking differently: Inject 45 Units into the skin daily.  11/07/14   Veatrice Bourbon, MD  penicillin v potassium (VEETID) 500 MG tablet Take 1 tablet (500 mg total) by mouth 4 (four) times daily. 12/29/14 01/05/15  Delsa Grana, PA-C  pregabalin (LYRICA) 50 MG capsule Take 50 mg by mouth 3 (three) times daily.    Historical Provider, MD  QUEtiapine (SEROQUEL) 100 MG tablet Take 100 mg by mouth at bedtime.    Historical Provider, MD  SUMAtriptan (IMITREX) 100 MG tablet Take 1tablet at earliest onset of headache.  May repeat once in 2 hours if headache persists or  recurs.  Do not exceed 2 tablets in 24 hrs 12/21/14   Pieter Partridge, DO  topiramate (TOPAMAX) 25 MG tablet Take 1 tablet (25 mg total) by mouth at bedtime. 12/21/14   Pieter Partridge, DO  traMADol (ULTRAM) 50 MG tablet Take 1 tablet (50 mg total) by mouth every 6 (six) hours as needed. 12/29/14   Delsa Grana, PA-C   BP 119/73 mmHg  Pulse 104  Temp(Src) 97.3 F (36.3 C) (Axillary)  Resp 18  SpO2 100% Physical Exam  Constitutional: She is oriented to person, place, and time. She appears well-developed and well-nourished. No distress.  HENT:  Head: Normocephalic and atraumatic.    Right Ear: External ear normal.  Left Ear: External ear normal.  Nose: Nose normal.  Mouth/Throat: Uvula is midline and oropharynx is clear and moist. No trismus in the jaw. Dental caries present. No dental abscesses or uvula swelling. No oropharyngeal exudate or posterior oropharyngeal erythema.    No sublingual tenderness. Soft and hard palate normal  Eyes: Conjunctivae and EOM are normal. Pupils are equal, round, and reactive to light. Right eye exhibits no discharge. Left eye exhibits no discharge. No scleral icterus.  Neck: Normal range of motion. Neck supple. No JVD present. No tracheal deviation present.  Cardiovascular: Normal rate and regular rhythm.   Pulmonary/Chest: Effort normal and breath sounds normal. No stridor. No respiratory distress.  Musculoskeletal: Normal range of motion. She exhibits no edema.  Lymphadenopathy:    She has no cervical adenopathy.  Neurological: She is alert and oriented to person, place, and time. She exhibits normal muscle tone. Coordination normal.  Skin: Skin is warm and dry. No rash noted. She is not diaphoretic. No erythema. No pallor.  Psychiatric: She has a normal mood and affect. Her behavior is normal. Judgment and thought content normal.  Nursing note and vitals reviewed.   ED Course  Procedures (including critical care time) DIAGNOSTIC STUDIES: Oxygen  Saturation is 97% on RA, nl by my interpretation.    COORDINATION OF CARE: 1:32 PM Discussed treatment plan with pt at bedside which includes percocet, dental block, penicillin, pain medications, and f/u with dental appointment on 12/31/2014 and pt agreed to plan.    Labs Review Labs Reviewed - No data to display  Imaging Review No results found.    EKG Interpretation None      MDM   Final diagnoses:  Pain due to dental caries   Patient with toothache.  No gross abscess.  Exam unconcerning for Ludwig's angina or spread of infection.  Will treat with penicillin and pain medicine.  Urged patient to follow-up with dentist.     I personally performed the services described in this documentation, which was scribed in my presence. The recorded information has been  reviewed and is accurate.      Delsa Grana, PA-C 01/15/15 [id number]  Quintella Reichert, MD 01/15/15 [phone]

## 2014-12-29 NOTE — ED Notes (Signed)
Has appt at dentist Monday for broken/caried tooth on upper right posterior jaw. Patient here d/t large amount of pain the tooth is causing her. Doubled over and moaning in triage, difficulty opening mouth d/t pain.

## 2014-12-31 ENCOUNTER — Other Ambulatory Visit: Payer: Self-pay | Admitting: *Deleted

## 2014-12-31 MED ORDER — INSULIN GLARGINE 100 UNIT/ML SOLOSTAR PEN: 45.0000 [IU] | PEN_INJECTOR | Freq: Every day | SUBCUTANEOUS | Status: DC

## 2015-01-15 ENCOUNTER — Other Ambulatory Visit: Payer: Self-pay | Admitting: *Deleted

## 2015-01-15 MED ORDER — INSULIN PEN NEEDLE 31G X 5 MM MISC: Status: DC

## 2015-01-22 ENCOUNTER — Other Ambulatory Visit: Payer: Self-pay | Admitting: Family Medicine

## 2015-01-22 ENCOUNTER — Encounter: Payer: Self-pay | Admitting: Family Medicine

## 2015-01-22 ENCOUNTER — Ambulatory Visit (INDEPENDENT_AMBULATORY_CARE_PROVIDER_SITE_OTHER): Payer: Medicaid Other | Admitting: Family Medicine

## 2015-01-22 VITALS — BP 130/70 | HR 101 | Temp 98.3°F | Ht 67.0 in | Wt 180.0 lb

## 2015-01-22 DIAGNOSIS — J111 Influenza due to unidentified influenza virus with other respiratory manifestations: Secondary | ICD-10-CM | POA: Diagnosis not present

## 2015-01-22 DIAGNOSIS — M7918 Myalgia, other site: Secondary | ICD-10-CM

## 2015-01-22 DIAGNOSIS — M791 Myalgia: Secondary | ICD-10-CM

## 2015-01-22 DIAGNOSIS — R69 Illness, unspecified: Principal | ICD-10-CM

## 2015-01-22 NOTE — Telephone Encounter (Signed)
Will forward to MD. Novolog script was written to phone in and never sent to pharmacy. Viggo Perko,CMA

## 2015-01-22 NOTE — Telephone Encounter (Signed)
Needs refill on insulin.  Pharmacy says they didn't receive the [place]

## 2015-01-22 NOTE — Patient Instructions (Signed)
Most likely caught a really bad virus, maybe even the flu (flu shot isn't always effective unfortunately)  Continue drinking plenty of water  With your arm pain, try to do range of motion exercises as tolerated, use a muscle cream/rub to see if it helps with the pain. If it isn't getting better with your improvement of the other symptoms come back to see [place] so we can look at it again.  Your symptoms are due to a viral illness. Antibiotics will not help improve your symptoms, but the following will help you feel better while your body fights the virus.   Stay hydrated - drink a lot of water   Nasal Saline Spray  Congestion:   Nose spray: Afrin (Phenylephrine). DO NOT USE MORE THAN 3 DAYS  Oral: Pseudoephedrine  Sneezing & Runny nose: Cetirizine (Zyrtec), Fexofenadine (Allegra), Loratadine (Claritin)  Pain/Sore throat: Tylenol, Ibuprofen  Cough: Dextromethorphan, (Guaifenesin, "Mucinex") & Albuterol  Wash your hands often to prevent spreading the virus

## 2015-01-22 NOTE — Telephone Encounter (Signed)
Still needs RX for novolog.  hasnt received anything yet

## 2015-01-22 NOTE — Addendum Note (Signed)
Addended by: Valerie Roys on: 01/22/2015 04:53 PM   Modules accepted: Orders

## 2015-01-22 NOTE — Telephone Encounter (Signed)
Needs strips for her machine--Accu-check  And lancets

## 2015-01-22 NOTE — Progress Notes (Signed)
   Subjective:    Patient ID: Barbara Clark, female    DOB: [birth year], 40 y.o.   MRN: [id number]  HPI  Patient presents for Same Day Appointment  CC: hoarse voice  # URI/influenza like illness:  Started 6 days ago with a cough and sore throat  Also had severe sneezing, which eventually caused pain in her left arm (which continues to hurt... Now both during sneezing and when moving it)  Has been laying in bed with fever the past 3 days  Lost her voice 2 days ago  Drinking a lot of water  Taking some over the counter medicines, cough drops ROS: fevers, chills, no nausea  Social Hx: current smoker  Review of Systems   See HPI for ROS.   Past medical history, surgical, family, and social history reviewed and updated in the EMR as appropriate.  Objective:  BP 130/70 mmHg  Pulse 101  Temp(Src) 98.3 F (36.8 C) (Oral)  Ht 5\' 7"  (1.702 m)  Wt 180 lb (81.647 kg)  BMI 28.19 kg/m2  SpO2 98% Vitals and nursing note reviewed  General: appears sick, leans against wall from exam table but sits up on entering the room Eyes: normal conjunctiva and sclera  ENTM: TMs pearly gray bilaterally without erythema or exudate. Moist mucous membranes.  CV: elevated rate, regualr rhythm, normal heart sounds, no murmur appreciated. 2+ radial pulses bilaterally  Resp: clear to auscultation bilaterally, normal effort MSK: TTP left deltoid diffusely, passive ROM intact, active ROM limited by pain Neuro: alert and oriented. Grip strength 5/5 bilaterally   Assessment & Plan:  1. Influenza-like illness Description sounds like influenza, starting to get better now. Continue OTCs, rest, fluids. Patient elects to defer influenza testing since would be beyond treatment window. Follow up as needed. 2. Left deltoid arm pain ?muscle injury with sneezing/coughing episodes. Recommended OTC muscle rubs, ROM exercise, and follow up if not improving over next 1-2 weeks

## 2015-01-23 MED ORDER — INSULIN ASPART 100 UNIT/ML FLEXPEN: PEN_INJECTOR | SUBCUTANEOUS | Status: DC

## 2015-01-23 NOTE — Telephone Encounter (Signed)
Medication refilled by provider. Jazmin Hartsell,CMA

## 2015-01-23 NOTE — Telephone Encounter (Signed)
I called in Novolog to her pharm today. She already got Lantus refill. I called in her test strip. Please advise patient to contact pharmacy to pick up her refill.

## 2015-02-15 ENCOUNTER — Other Ambulatory Visit: Payer: Self-pay

## 2015-02-15 MED ORDER — SUMATRIPTAN SUCCINATE 100 MG PO TABS: ORAL_TABLET | ORAL | Status: DC

## 2015-02-15 NOTE — Telephone Encounter (Signed)
Last OV: 12/21/14 Next OV: 04/18/15

## 2015-02-23 ENCOUNTER — Telehealth: Payer: Self-pay | Admitting: Family Medicine

## 2015-02-23 MED ORDER — INSULIN ASPART 100 UNIT/ML FLEXPEN: PEN_INJECTOR | SUBCUTANEOUS | Status: DC

## 2015-02-23 NOTE — Telephone Encounter (Signed)
Emergency Line / After Hours Call  Patient calling and stating that her blood sugar is 400. She went to her pharmacy today and they are not open and she is out of her novolog. She hasn't had anything to eat today and has been drinking a lot of water.   Advised that she increase her lantus to 50 U and I have sent a prescription of novolog to [facility].  review of her chart shows that he blood sugars have not been controlled for some time and her PCP has contemplated referring her to endocrinologist for consideration of insulin pump.   Rosemarie Ax, MD PGY-3, [facility] 02/23/2015, 4:47 PM

## 2015-03-14 ENCOUNTER — Emergency Department (HOSPITAL_COMMUNITY)
Admission: EM | Admit: 2015-03-14 | Discharge: 2015-03-14 | Disposition: A | Discharge status: Home / Self Care | Payer: Medicaid Other | Attending: Emergency Medicine | Admitting: Emergency Medicine

## 2015-03-14 ENCOUNTER — Encounter (HOSPITAL_COMMUNITY): Payer: Self-pay | Admitting: Emergency Medicine

## 2015-03-14 ENCOUNTER — Other Ambulatory Visit: Payer: Self-pay | Admitting: *Deleted

## 2015-03-14 DIAGNOSIS — T85898A Other specified complication of other internal prosthetic devices, implants and grafts, initial encounter: Secondary | ICD-10-CM | POA: Insufficient documentation

## 2015-03-14 DIAGNOSIS — R739 Hyperglycemia, unspecified: Secondary | ICD-10-CM

## 2015-03-14 DIAGNOSIS — E1065 Type 1 diabetes mellitus with hyperglycemia: Secondary | ICD-10-CM | POA: Diagnosis not present

## 2015-03-14 DIAGNOSIS — F419 Anxiety disorder, unspecified: Secondary | ICD-10-CM | POA: Diagnosis not present

## 2015-03-14 DIAGNOSIS — K002 Abnormalities of size and form of teeth: Secondary | ICD-10-CM | POA: Diagnosis not present

## 2015-03-14 DIAGNOSIS — K0889 Other specified disorders of teeth and supporting structures: Secondary | ICD-10-CM | POA: Diagnosis present

## 2015-03-14 DIAGNOSIS — Z794 Long term (current) use of insulin: Secondary | ICD-10-CM | POA: Diagnosis not present

## 2015-03-14 DIAGNOSIS — F17210 Nicotine dependence, cigarettes, uncomplicated: Secondary | ICD-10-CM | POA: Insufficient documentation

## 2015-03-14 DIAGNOSIS — Y658 Other specified misadventures during surgical and medical care: Secondary | ICD-10-CM | POA: Diagnosis not present

## 2015-03-14 DIAGNOSIS — Z8619 Personal history of other infectious and parasitic diseases: Secondary | ICD-10-CM | POA: Diagnosis not present

## 2015-03-14 DIAGNOSIS — Z79899 Other long term (current) drug therapy: Secondary | ICD-10-CM | POA: Diagnosis not present

## 2015-03-14 DIAGNOSIS — T85848A Pain due to other internal prosthetic devices, implants and grafts, initial encounter: Secondary | ICD-10-CM

## 2015-03-14 DIAGNOSIS — F329 Major depressive disorder, single episode, unspecified: Secondary | ICD-10-CM | POA: Diagnosis not present

## 2015-03-14 LAB — CBG MONITORING, ED: Glucose-Capillary: 321 mg/dL — ABNORMAL HIGH (ref 65–99)

## 2015-03-14 MED ORDER — HYDROCODONE-ACETAMINOPHEN 5-325 MG PO TABS
2.0000 | ORAL_TABLET | Freq: Once | ORAL | Status: AC
Start: 2015-03-14 — End: 2015-03-14
  Administered 2015-03-14: 2 via ORAL
  Filled 2015-03-14: qty 2

## 2015-03-14 MED ORDER — HYDROCODONE-ACETAMINOPHEN 5-325 MG PO TABS: 1.0000 | ORAL_TABLET | ORAL | Status: DC | PRN

## 2015-03-14 NOTE — ED Notes (Signed)
Pt c/o dental pain onset this morning. States she has dentist appointment on 03-19-15. Pt took leftover 1 hydrocodon-acetaminophen and 2 ibuprofen this morning for pain, pt had facial edema after this and took a benadryl for edema. Pt's lips remain swollen, pt states they have improved. Pt in control of oral secretions, no respiratory distress at this time, voice quality normal.

## 2015-03-14 NOTE — Discharge Instructions (Signed)
Hyperglycemia °High blood sugar (hyperglycemia) means that the level of sugar in your blood is higher than it should be. Signs of high blood sugar include: °· Feeling thirsty. °· Frequent peeing (urinating). °· Feeling tired or sleepy. °· Dry mouth. °· Vision changes. °· Feeling weak. °· Feeling hungry but losing weight. °· Numbness and tingling in your hands or feet. °· Headache. °When you ignore these signs, your blood sugar may keep going up. These problems may get worse, and other problems may begin. °HOME CARE °· Check your blood sugars as told by your doctor. Write down the numbers with the date and time. °· Take the right amount of insulin or diabetes pills at the right time. Write down the dose with date and time. °· Refill your insulin or diabetes pills before running out. °· Watch what you eat. Follow your meal plan. °· Drink liquids without sugar, such as water. Check with your doctor if you have kidney or heart disease. °· Follow your doctor's orders for exercise. Exercise at the same time of day. °· Keep your doctor's appointments. °GET HELP RIGHT AWAY IF:  °· You have trouble thinking or are confused. °· You have fast breathing with fruity smelling breath. °· You pass out (faint). °· You have 2 to 3 days of high blood sugars and you do not know why. °· You have chest pain. °· You are feeling sick to your stomach (nauseous) or throwing up (vomiting). °· You have sudden vision changes. °MAKE SURE YOU:  °· Understand these instructions. °· Will watch your condition. °· Will get help right away if you are not doing well or get worse. °  °This information is not intended to replace advice given to you by your health care provider. Make sure you discuss any questions you have with your health care provider. °  °Document Released: 11/02/2008 Document Revised: 01/26/2014 Document Reviewed: 09/11/2014 °Elsevier Interactive Patient Education ©2016 Elsevier Inc. ° °

## 2015-03-14 NOTE — ED Provider Notes (Signed)
CSN: [id number]     Arrival date & time 03/14/15  0710 History   First MD Initiated Contact with Patient 03/14/15 0720     Chief Complaint  Patient presents with  . Dental Pain  . Facial Swelling     (Consider location/radiation/quality/duration/timing/severity/associated sxs/prior Treatment) HPI   Barbara Clark is a 40 y.o. female who presents for evaluation of dental pain. She has achiness and pain in the left upper and lower molars which are scheduled to be extracted on 03/19/15. She noticed her blood sugar was low this morning, so ate a big bowl of cereal and banana. She did not take her a.m., NovoLog, because of the pain. She denies recent fever, chills, nausea, vomiting, weakness or dizziness. There are no other known modifying factors.  Past Medical History  Diagnosis Date  . Diabetes mellitus     Type I  . Depression   . Anxiety   . HSV-2 (herpes simplex virus 2) infection 2012    serology   Past Surgical History  Procedure Laterality Date  . Tubal ligation     Family History  Problem Relation Age of Onset  . Diabetes Mother   . Hypertension Mother   . Diabetes Maternal Grandmother    Social History  Substance Use Topics  . Smoking status: Current Every Day Smoker -- 1.00 packs/day    Types: Cigarettes  . Smokeless tobacco: Never Used     Comment: trying to quit  . Alcohol Use: 0.0 oz/week    0 Standard drinks or equivalent per week     Comment: rare but on occasion   OB History    Gravida Para Term Preterm AB TAB SAB Ectopic Multiple Living   3 3 3       3      Review of Systems  All other systems reviewed and are negative.     Allergies  Review of patient's allergies indicates no known allergies.  Home Medications   Prior to Admission medications   Medication Sig Start Date End Date Taking? Authorizing Provider  buPROPion (WELLBUTRIN XL) 150 MG 24 hr tablet Take 150 mg by mouth daily.    Historical Provider, MD  HYDROcodone-acetaminophen (NORCO)  5-325 MG tablet Take 1-2 tablets by mouth every 4 (four) hours as needed for moderate pain. 03/14/15   Daleen Bo, MD  insulin aspart (NOVOLOG) 100 UNIT/ML FlexPen Inject 17 unit [place] in morning, 17 unit [place] in the afternoon and 17 units [place] at night. Each with meal. Hold if glucose is less than 100 02/23/15   Rosemarie Ax, MD  insulin glargine (LANTUS) 100 UNIT/ML injection Inject 0.2 mLs (20 Units total) into the skin daily. Patient taking differently: Inject 45 Units into the skin daily.  11/07/14   Alyssa A Haney, MD  Insulin Pen Needle (B-D UF III MINI PEN NEEDLES) 31G X 5 MM MISC To be used with novolog flexpen and lantus solostar.  Can substitute per formulary as compatible. 01/15/15   [name], MD  pregabalin (LYRICA) 50 MG capsule Take 50 mg by mouth 3 (three) times daily.    Historical Provider, MD  QUEtiapine (SEROQUEL) 100 MG tablet Take 100 mg by mouth at bedtime.    Historical Provider, MD  SUMAtriptan (IMITREX) 100 MG tablet Take 1tablet at earliest onset of headache.  May repeat once in 2 hours if headache persists or recurs.  Do not exceed 2 tablets in 24 hrs 02/15/15   Pieter Partridge, DO  topiramate (TOPAMAX)  25 MG tablet Take 1 tablet (25 mg total) by mouth at bedtime. 12/21/14   Pieter Partridge, DO   BP 134/94 mmHg  Pulse 98  Temp(Src) 98.2 F (36.8 C) (Oral)  Resp 16  SpO2 100% Physical Exam  Constitutional: She is oriented to person, place, and time. She appears well-developed and well-nourished. She appears distressed (she is uncomfortable).  HENT:  Head: Normocephalic and atraumatic.  Right Ear: External ear normal.  Left Ear: External ear normal.  Eyes: Conjunctivae and EOM are normal. Pupils are equal, round, and reactive to light.  No trismus. Left upper and lower molars with caries, but no associated swelling or deformities.  Neck: Normal range of motion and phonation normal. Neck supple.  Cardiovascular: Normal rate.   Pulmonary/Chest: Effort normal. She  exhibits no bony tenderness.  Musculoskeletal: Normal range of motion.  Neurological: She is alert and oriented to person, place, and time. No cranial nerve deficit or sensory deficit. She exhibits normal muscle tone. Coordination normal.  Skin: Skin is warm, dry and intact.  Psychiatric: She has a normal mood and affect. Her behavior is normal. Judgment and thought content normal.  Nursing note and vitals reviewed.   ED Course  Procedures (including critical care time)  Medications  HYDROcodone-acetaminophen (NORCO/VICODIN) 5-325 MG per tablet 2 tablet (not administered)    Patient Vitals for the past 24 hrs:  BP Temp Temp src Pulse Resp SpO2  03/14/15 0730 134/94 mmHg - - 98 - 100 %  03/14/15 0724 124/84 mmHg 98.2 F (36.8 C) Oral 93 16 100 %    7:41 AM Reevaluation with update and discussion. After initial assessment and treatment, an updated evaluation reveals no change in clinical status. Findings discussed with the patient. All questions were answered. [place] Review Labs Reviewed  CBG MONITORING, ED - Abnormal; Notable for the following:    Glucose-Capillary 321 (*)    All other components within normal limits    Imaging Review No results found. I have personally reviewed and evaluated these images and lab results as part of my medical decision-making.   EKG Interpretation None      MDM   Final diagnoses:  Dental implant pain, initial encounter  Hyperglycemia    Dental pain related to poor dentition, with schedule upcoming extractions. No apparent significant intraoral or dental infection. Hyperglycemia related to large carbohydrate load without taking insulin coverage. Doubt metabolic instability or impending vascular collapse.  Nursing Notes Reviewed/ Care Coordinated Applicable Imaging Reviewed Interpretation of Laboratory Data incorporated into ED treatment  The patient appears reasonably screened and/or stabilized for discharge and I  doubt any other medical condition or other [facility] requiring further screening, evaluation, or treatment in the ED at this time prior to discharge.  Plan: Home Medications- Norco; Home Treatments- JHeat; return here if the recommended treatment, does not improve the symptoms; Recommended follow up- Oral Surgeon as scheduled     Daleen Bo, MD 03/14/15 [phone]

## 2015-03-15 ENCOUNTER — Telehealth: Payer: Self-pay | Admitting: *Deleted

## 2015-03-15 ENCOUNTER — Other Ambulatory Visit: Payer: Self-pay | Admitting: Family Medicine

## 2015-03-15 MED ORDER — PREGABALIN 50 MG PO CAPS: 50.0000 mg | ORAL_CAPSULE | Freq: Three times a day (TID) | ORAL | Status: DC

## 2015-03-15 NOTE — Telephone Encounter (Signed)
Prior Authorization received from [facility] for Lyrica 150 mg capsule. PA was approved via [place] [employer] until 03/14/2016.  PA approval number [id number]. Derl Barrow, RN

## 2015-04-09 ENCOUNTER — Other Ambulatory Visit: Payer: Self-pay | Admitting: *Deleted

## 2015-04-09 MED ORDER — PREGABALIN 50 MG PO CAPS: 50.0000 mg | ORAL_CAPSULE | Freq: Three times a day (TID) | ORAL | Status: DC

## 2015-04-09 NOTE — Telephone Encounter (Signed)
Called in to pharm by me

## 2015-04-18 ENCOUNTER — Ambulatory Visit: Payer: Self-pay | Admitting: Neurology

## 2015-04-22 ENCOUNTER — Other Ambulatory Visit: Payer: Self-pay | Admitting: *Deleted

## 2015-04-22 MED ORDER — INSULIN ASPART 100 UNIT/ML FLEXPEN: PEN_INJECTOR | SUBCUTANEOUS | Status: DC

## 2015-04-23 MED ORDER — INSULIN ASPART 100 UNIT/ML FLEXPEN: PEN_INJECTOR | SUBCUTANEOUS | Status: DC

## 2015-04-23 NOTE — Telephone Encounter (Signed)
Novolog printed and placed up front for faxing.

## 2015-04-23 NOTE — Addendum Note (Signed)
Addended by: Andrena Mews T on: 04/23/2015 07:51 AM   Modules accepted: Orders

## 2015-04-24 ENCOUNTER — Other Ambulatory Visit: Payer: Self-pay | Admitting: *Deleted

## 2015-04-24 MED ORDER — INSULIN PEN NEEDLE 31G X 5 MM MISC: Status: DC

## 2015-05-06 ENCOUNTER — Other Ambulatory Visit: Payer: Self-pay | Admitting: *Deleted

## 2015-05-06 MED ORDER — INSULIN GLARGINE 100 UNIT/ML ~~LOC~~ SOLN: 45.0000 [IU] | Freq: Every day | SUBCUTANEOUS | Status: DC

## 2015-05-06 MED ORDER — PREGABALIN 50 MG PO CAPS: 50.0000 mg | ORAL_CAPSULE | Freq: Three times a day (TID) | ORAL | Status: DC

## 2015-05-07 ENCOUNTER — Ambulatory Visit (INDEPENDENT_AMBULATORY_CARE_PROVIDER_SITE_OTHER): Payer: Medicaid Other | Admitting: Family Medicine

## 2015-05-07 ENCOUNTER — Telehealth: Payer: Self-pay | Admitting: *Deleted

## 2015-05-07 ENCOUNTER — Encounter: Payer: Self-pay | Admitting: Family Medicine

## 2015-05-07 VITALS — BP 114/74 | HR 87 | Temp 98.3°F | Ht 67.0 in | Wt 178.5 lb

## 2015-05-07 DIAGNOSIS — M25512 Pain in left shoulder: Secondary | ICD-10-CM | POA: Insufficient documentation

## 2015-05-07 DIAGNOSIS — E108 Type 1 diabetes mellitus with unspecified complications: Secondary | ICD-10-CM

## 2015-05-07 DIAGNOSIS — M25511 Pain in right shoulder: Secondary | ICD-10-CM | POA: Insufficient documentation

## 2015-05-07 LAB — POCT GLYCOSYLATED HEMOGLOBIN (HGB A1C): Hemoglobin A1C: 7.8

## 2015-05-07 MED ORDER — NAPROXEN 500 MG PO TABS: 500.0000 mg | ORAL_TABLET | Freq: Two times a day (BID) | ORAL | Status: DC

## 2015-05-07 NOTE — Patient Instructions (Signed)
Thank you for coming in to clinic today.  1. For your Diabetes - A1c is improved today. Down from 8.4 to 7.8. Keep up the good work. No insulin changes today, discuss insulin pump with your regular doctor - Dr Gwendlyn Deutscher  2. For Left shoulder Recommend trial of Anti-inflammatory with Naproxen (Naprosyn) 500mg  tabs - take one with food and plenty of water TWICE daily every day (breakfast and dinner), for next 2 to 4 weeks, then you may take only as needed - DO NOT TAKE any ibuprofen, aleve, motrin while you are taking this medicine - It is safe to take Tylenol Ext Str 500mg  tabs - take 1 to 2 (max dose 1000mg ) every 6 hours as needed for breakthrough pain, max 24 hour daily dose is 6 to 8 tablets or 4000mg  - Do range of motion exercises as demonstrated to avoid frozen shoulder - avoid heavy lifting but stay active - Use ice packs and heating pad as needed  X-rays go to [facility], 1st floor radiology department Mon - Fri business hours, no appointment  Please schedule a follow-up appointment with Dr Gwendlyn Deutscher in 2 to 4 weeks to follow-up Left Shoulder pain, if not improved may do injection or referral   If you have any other questions or concerns, please feel free to call the clinic to contact me. You may also schedule an earlier appointment if necessary.  However, if your symptoms get significantly worse, please go to the Emergency Department to seek immediate medical attention.  Nobie Putnam, [place]

## 2015-05-07 NOTE — Telephone Encounter (Signed)
Prior Authorization received from [facility] for Lyrica 50 mg. PA was approved unitl 05/06/16. Prior approval number [id number]. Derl Barrow, RN

## 2015-05-07 NOTE — Progress Notes (Signed)
Subjective:    Patient ID: Barbara Clark, female    DOB: November 20, 1975, 40 y.o.   MRN: [id number]  Barbara Clark is a 40 y.o. female presenting on 05/07/2015 for Follow-up and Arm Pain   HPI   LEFT ARM / SHOULDER PAIN: - Last seen 12/2014, diagnosed with influenza and also had Left arm pain, thought to be deltoid muscle strain, advised conservative care, did not seem to improve - Today returns with progressively worsening lateral left upper arm pain into shoulder. Worse with activity, lifting. Difficulty lifting above shoulder height or behind head. Also admits to new fall recently within past 1 week, due to Right lower extremity neuropathy, trauma directly to Left shoulder. - Tried Tramadol, Tylenol, tried Aleve - No prior shoulder injury or surgery before. No recent imaging. - Denies numbness, weakness, or tingling in arm  CHRONIC DM, Type 2: Reports no concerns, seems to be eating better diabetic diet with healthier diet CBGs: Avg low 200s, Low 65, High 360. Checks CBGs x 3 Meds: Lantus 45 daily, Novolog 17 TID wc Reports good compliance. Tolerating well w/o side-effects Currently not on ACE/ARB - History of chronic diabetic neuropathy in lower ext (Right > Left), chronic right LE numbness Denies hypoglycemia, polyuria, visual changes.   Social History  Substance Use Topics  . Smoking status: Current Every Day Smoker -- 1.00 packs/day    Types: Cigarettes  . Smokeless tobacco: Never Used     Comment: trying to quit  . Alcohol Use: 0.0 oz/week    0 Standard drinks or equivalent per week     Comment: rare but on occasion    Review of Systems Per HPI unless specifically indicated above     Objective:    BP 114/74 mmHg  Pulse 87  Temp(Src) 98.3 F (36.8 C) (Oral)  Ht 5\' 7"  (1.702 m)  Wt 178 lb 8 oz (80.967 kg)  BMI 27.95 kg/m2  Wt Readings from Last 3 Encounters:  05/07/15 178 lb 8 oz (80.967 kg)  01/22/15 180 lb (81.647 kg)  12/28/14 179 lb (81.194 kg)      Physical Exam  Constitutional: She appears well-developed and well-nourished. No distress.  Currently well appearing, cooperative, uncomfortable with left shoulder pain, has cane  HENT:  Head: Normocephalic and atraumatic.  Mouth/Throat: Oropharynx is clear and moist.  Neck: Normal range of motion. Neck supple.  Cardiovascular: Normal rate and intact distal pulses.   Pulmonary/Chest: Effort normal.  Musculoskeletal: She exhibits no edema.  Left Shoulder Inspection: Slight increased left lateral deltoid muscle bulge without ecchymosis Palpation: Mild-mod tenderness to palpation over lateral left deltoid, no tenderness anterior or posterior shoulder including AC  ROM: Significantly reduced active ROM with limited forward flexion and internal rotation, somewhat limited passive ROM due to pain did not pursue further testing today  Special Testing: Due to acute pain unable to fully test rotator cuff strength Strength: Normal strength 5/5 grip bilaterally, 4/5 deltoid str on left due to pain, 5/5 int/ext rotation Neurovascular: Distally intact pulses, sensation to light touch   Neurological: She is alert.  Skin: Skin is warm and dry. No rash noted. She is not diaphoretic.  Nursing note and vitals reviewed.    DM FOOT EXAM - normal appearance, no lesions, calluses, or ulcers, Right lower foot and ankle no sensation to monofilament (unchanged from prior) minimal sensation to light touch on dorsum and ankle to mid shin to light touch with finger. Left foot intact sensation to monofilament     Assessment &  Plan:   Problem List Items Addressed This Visit    Left shoulder pain    Consistent with chronic Left shoulder pain, limited exam due to acute pain, but concern for possible developing frozen shoulder (in Diabetic patient) secondary to pain from deltoid strain vs rotator cuff tendinopathy. No obvious muscle weakness.  Plan: 1. Ordered Left shoulder X-rays 2. Start Naprosyn Naprosyn 500mg   twice daily (with food) for 2 weeks, then as needed 3. Relative rest but keep shoulder mobile, demonstrated ROM exercises, avoid heavy lifting 4. May try heating pad vs ice PRN 5. RTC within 1 month re-evaluation, if not improved consider subacromial steroid inj (cautious with Diabetes), if worsening or persistent concern for frozen shoulder consider referral to PT vs [facility]       Relevant Medications   naproxen (NAPROSYN) 500 MG tablet   Other Relevant Orders   DG Shoulder Left   Diabetes mellitus type I ([place]) - Primary    Significantly improved DM1 control, A1c 8.4 to 7.8, now adhering to insulin regimen well. No hypoglycemia. Complications - peripheral neuropathy  Plan:  1. Continue current therapy - Lantus 45 daily, Novolog 17u TID wc 2. Lifestyle Mods - Continue improved DM diet (dec carbs, inc vegs), continue to try exercise limited by mobility with neuropathy 3. Will need yearly ophtho exam 4. RTC 3 mo re-check A1c, future follow-up consider Endocrinology referral for insulin pump, especially if declining control      Relevant Orders   HgB A1c (Completed)      Meds ordered this encounter  Medications  . naproxen (NAPROSYN) 500 MG tablet    Sig: Take 1 tablet (500 mg total) by mouth 2 (two) times daily with a meal. For 2 weeks then as needed.    Dispense:  60 tablet    Refill:  0      Follow up plan: Return in about 4 weeks (around 06/04/2015) for Left shoulder pain.  Nobie Putnam, [place], PGY-3

## 2015-05-07 NOTE — Assessment & Plan Note (Signed)
Significantly improved DM1 control, A1c 8.4 to 7.8, now adhering to insulin regimen well. No hypoglycemia. Complications - peripheral neuropathy  Plan:  1. Continue current therapy - Lantus 45 daily, Novolog 17u TID wc 2. Lifestyle Mods - Continue improved DM diet (dec carbs, inc vegs), continue to try exercise limited by mobility with neuropathy 3. Will need yearly ophtho exam 4. RTC 3 mo re-check A1c, future follow-up consider Endocrinology referral for insulin pump, especially if declining control

## 2015-05-07 NOTE — Assessment & Plan Note (Addendum)
Consistent with chronic Left shoulder pain, limited exam due to acute pain, but concern for possible developing frozen shoulder (in Diabetic patient) secondary to pain from deltoid strain vs rotator cuff tendinopathy. No obvious muscle weakness.  Plan: 1. Ordered Left shoulder X-rays 2. Start Naprosyn Naprosyn 500mg  twice daily (with food) for 2 weeks, then as needed 3. Relative rest but keep shoulder mobile, demonstrated ROM exercises, avoid heavy lifting 4. May try heating pad vs ice PRN 5. RTC within 1 month re-evaluation, if not improved consider subacromial steroid inj (cautious with Diabetes), if worsening or persistent concern for frozen shoulder consider referral to PT vs [facility]

## 2015-05-13 ENCOUNTER — Ambulatory Visit (HOSPITAL_COMMUNITY)
Admission: RE | Admit: 2015-05-13 | Discharge: 2015-05-13 | Disposition: A | Discharge status: Home / Self Care | Payer: Medicaid Other | Source: Ambulatory Visit | Attending: Family Medicine | Admitting: Family Medicine

## 2015-05-13 DIAGNOSIS — M25512 Pain in left shoulder: Secondary | ICD-10-CM | POA: Insufficient documentation

## 2015-05-14 ENCOUNTER — Telehealth: Payer: Self-pay | Admitting: Family Medicine

## 2015-05-14 NOTE — Telephone Encounter (Signed)
Last saw patient 05/07/15, for T1DM and Left Shoulder Pain, see note for details.  Reviewed results of Left Shoulder X-rays (done 05/13/15), she has had some falls on Left shoulder and planned to rule out any fracture or bony injury, eval for any degeneration or bone spur as well.  Left Shoulder X-ray, complete FINDINGS: There is no evidence of fracture or dislocation. There is no evidence of arthropathy or other focal bone abnormality. Soft tissues are unremarkable.  IMPRESSION: Normal examination.  Called patient today, reviewed negative X-ray results, she states has had some improvement in Left shoulder, she tries to do ROM exercises, taking medicine, but did have another episode of falling on this shoulder. No significant worsening. Reassurance given no fracture. Advised her to continue on planned treatment for up to 4 weeks, then return for re-evaluation, consider subacromial steroid injection (however concern with T1DM) vs referral to PT vs Sports medicine to check shoulder with [facility] ultrasound.  Nobie Putnam, [place], PGY-3

## 2015-05-16 ENCOUNTER — Other Ambulatory Visit: Payer: Self-pay

## 2015-05-16 MED ORDER — SUMATRIPTAN SUCCINATE 100 MG PO TABS: ORAL_TABLET | ORAL | Status: DC

## 2015-05-16 NOTE — Telephone Encounter (Signed)
Pt noshowed 3/30 f/u appointment. Message attached to the Sig of Imitrex for pt to reschedule f/u. Pt was given 1 month and 1 refill. No more refills to be given w/o f/u scheduled.

## 2015-05-23 ENCOUNTER — Other Ambulatory Visit: Payer: Self-pay | Admitting: Family Medicine

## 2015-05-23 ENCOUNTER — Telehealth: Payer: Self-pay | Admitting: *Deleted

## 2015-05-23 DIAGNOSIS — E1040 Type 1 diabetes mellitus with diabetic neuropathy, unspecified: Secondary | ICD-10-CM

## 2015-05-23 DIAGNOSIS — E108 Type 1 diabetes mellitus with unspecified complications: Secondary | ICD-10-CM

## 2015-05-23 MED ORDER — ACCU-CHEK SOFT TOUCH LANCETS MISC: Status: DC

## 2015-05-23 MED ORDER — GLUCOSE BLOOD VI STRP: ORAL_STRIP | Status: DC

## 2015-05-23 NOTE — Telephone Encounter (Signed)
Placed up front for faxing  Call Documentation      [name], MD at 05/23/2015 3:39 PM     Status: Signed       Expand All Collapse All   I contacted patient to confirm her glucometer. She uses Accu Chek. She need refill on Lancet and test strip. Refill given.

## 2015-05-23 NOTE — Telephone Encounter (Signed)
I contacted patient to confirm her glucometer. She uses Accu Chek. She need refill on Lancet and test strip. Refill given.

## 2015-05-23 NOTE — Telephone Encounter (Signed)
Received faxed refill request from [facility] for Accu-chek softclix lancets, use as directed TID Qty 500 with 4 refills. This is not on current or historical med list Routed to PCP for review DUCATTE, Orvis Brill, RN

## 2015-05-29 LAB — HM DIABETES EYE EXAM

## 2015-07-08 ENCOUNTER — Other Ambulatory Visit: Payer: Self-pay

## 2015-07-08 ENCOUNTER — Other Ambulatory Visit: Payer: Self-pay | Admitting: *Deleted

## 2015-07-08 MED ORDER — INSULIN GLARGINE 100 UNIT/ML SOLOSTAR PEN: 45.0000 [IU] | PEN_INJECTOR | Freq: Every day | SUBCUTANEOUS | Status: DC

## 2015-07-08 MED ORDER — TOPIRAMATE 25 MG PO TABS: 25.0000 mg | ORAL_TABLET | Freq: Every day | ORAL | Status: DC

## 2015-08-06 ENCOUNTER — Other Ambulatory Visit: Payer: Self-pay | Admitting: *Deleted

## 2015-08-06 MED ORDER — PREGABALIN 50 MG PO CAPS: 50.0000 mg | ORAL_CAPSULE | Freq: Three times a day (TID) | ORAL | Status: DC

## 2015-08-21 ENCOUNTER — Other Ambulatory Visit: Payer: Self-pay | Admitting: *Deleted

## 2015-08-22 MED ORDER — INSULIN ASPART 100 UNIT/ML FLEXPEN: PEN_INJECTOR | SUBCUTANEOUS | 3 refills | Status: DC

## 2015-09-10 ENCOUNTER — Telehealth: Payer: Self-pay | Admitting: *Deleted

## 2015-09-10 NOTE — Telephone Encounter (Signed)
Form completed and handed back to Tamika. 

## 2015-09-10 NOTE — Telephone Encounter (Signed)
PA for Lyrica is pending per [place] [employer].  Derl Barrow, RN

## 2015-09-10 NOTE — Telephone Encounter (Signed)
Received PA approval for Lyrica 50 mg via [place] [employer].  Med approved for 09/10/15 - 09/09/16.  [place] informed.  PA approval number [id number]. Derl Barrow, RN

## 2015-09-10 NOTE — Telephone Encounter (Signed)
Prior Authorization received from [facility] for Lyrica 50 mg. Formulary and PA form placed in provider box for completion. Derl Barrow, RN

## 2015-09-17 ENCOUNTER — Ambulatory Visit: Payer: Self-pay | Admitting: Family Medicine

## 2015-09-20 ENCOUNTER — Other Ambulatory Visit (HOSPITAL_COMMUNITY)
Admission: RE | Admit: 2015-09-20 | Discharge: 2015-09-20 | Disposition: A | Discharge status: Home / Self Care | Payer: Medicaid Other | Source: Ambulatory Visit | Attending: Family Medicine | Admitting: Family Medicine

## 2015-09-20 ENCOUNTER — Encounter: Payer: Self-pay | Admitting: Family Medicine

## 2015-09-20 ENCOUNTER — Ambulatory Visit (INDEPENDENT_AMBULATORY_CARE_PROVIDER_SITE_OTHER): Payer: Medicaid Other | Admitting: Family Medicine

## 2015-09-20 VITALS — BP 119/68 | HR 93 | Temp 98.6°F | Wt 171.0 lb

## 2015-09-20 DIAGNOSIS — E1040 Type 1 diabetes mellitus with diabetic neuropathy, unspecified: Secondary | ICD-10-CM | POA: Diagnosis not present

## 2015-09-20 DIAGNOSIS — Z124 Encounter for screening for malignant neoplasm of cervix: Secondary | ICD-10-CM | POA: Diagnosis not present

## 2015-09-20 DIAGNOSIS — Z01419 Encounter for gynecological examination (general) (routine) without abnormal findings: Secondary | ICD-10-CM | POA: Diagnosis present

## 2015-09-20 DIAGNOSIS — E108 Type 1 diabetes mellitus with unspecified complications: Secondary | ICD-10-CM

## 2015-09-20 DIAGNOSIS — Z23 Encounter for immunization: Secondary | ICD-10-CM | POA: Diagnosis not present

## 2015-09-20 DIAGNOSIS — R29898 Other symptoms and signs involving the musculoskeletal system: Secondary | ICD-10-CM | POA: Diagnosis not present

## 2015-09-20 DIAGNOSIS — Z1151 Encounter for screening for human papillomavirus (HPV): Secondary | ICD-10-CM | POA: Insufficient documentation

## 2015-09-20 DIAGNOSIS — Z13220 Encounter for screening for lipoid disorders: Secondary | ICD-10-CM | POA: Diagnosis not present

## 2015-09-20 DIAGNOSIS — N898 Other specified noninflammatory disorders of vagina: Secondary | ICD-10-CM | POA: Diagnosis not present

## 2015-09-20 DIAGNOSIS — M79604 Pain in right leg: Secondary | ICD-10-CM | POA: Diagnosis not present

## 2015-09-20 LAB — POCT WET PREP (WET MOUNT)
Clue Cells Wet Prep Whiff POC: POSITIVE
Trichomonas Wet Prep HPF POC: ABSENT

## 2015-09-20 LAB — BASIC METABOLIC PANEL WITH GFR
BUN: 8 mg/dL (ref 7–25)
CO2: 24 mmol/L (ref 20–31)
Calcium: 8.8 mg/dL (ref 8.6–10.2)
Chloride: 102 mmol/L (ref 98–110)
Creat: 0.7 mg/dL (ref 0.50–1.10)
GFR, Est African American: >89 mL/min (ref >=60)
GFR, Est Non African American: >89 mL/min (ref >=60)
Glucose, Bld: 291 mg/dL — ABNORMAL HIGH (ref 65–99)
Potassium: 4.5 mmol/L (ref 3.5–5.3)
Sodium: 136 mmol/L (ref 135–146)

## 2015-09-20 LAB — LIPID PANEL
Cholesterol: 139 mg/dL (ref 125–200)
HDL: 46 mg/dL (ref >=46)
LDL Cholesterol: 85 mg/dL (ref <130)
Total CHOL/HDL Ratio: 3 Ratio (ref <=5.0)
Triglycerides: 40 mg/dL (ref <150)
VLDL: 8 mg/dL (ref <30)

## 2015-09-20 LAB — POCT GLYCOSYLATED HEMOGLOBIN (HGB A1C): Hemoglobin A1C: 8.9

## 2015-09-20 NOTE — Progress Notes (Signed)
Oc a1c

## 2015-09-20 NOTE — Patient Instructions (Signed)
It was nice seeing you today. Your A1C went up to 8.9. Please increase Lantus to 50 units daily in addition to your novolog. Continue home CBG check. Call if less than 70 and hold your insulin. Remember to have healthy snack in between meals. I have referred you to an endocrinologist to discuss insulin pump. I also referred you to PT as requested. I will see you back in 3 month.s.

## 2015-09-20 NOTE — Progress Notes (Signed)
Subjective:     Patient ID: Barbara Clark, female   DOB: Jan 28, 1975, 40 y.o.   MRN: [id number]  HPI DM2: She has been compliant with her meds, her CBG ranges from 150 to 200, she had a low number of 58 because she had not eaten for hours, she also had number in 400. Leg problem: C/O chronic right LL pain and weakness, her leg give way often, she well more than 1 week ago while she was In her kitchen. She had done well in the past on PT and will like referral for physical therapy. PAP: She endorsed fishy smell vaginal discharge. LMP was 1 week ago. She has IUD for  Bleeding control  And tubal ligation birth control. HM: Here for vaccination update.  Current Outpatient Prescriptions on File Prior to Visit  Medication Sig Dispense Refill  . buPROPion (WELLBUTRIN XL) 150 MG 24 hr tablet Take 450 mg by mouth daily.     . insulin aspart (NOVOLOG) 100 UNIT/ML FlexPen Inject 17 unit [place] in morning, 17 unit [place] in the afternoon and 17 units [place] at night. Each with meal. Hold if glucose is less than 100 15 mL 3  . insulin glargine (LANTUS) 100 UNIT/ML injection Inject 0.45 mLs (45 Units total) into the skin daily. 10 mL 11  . pregabalin (LYRICA) 50 MG capsule Take 1 capsule (50 mg total) by mouth 3 (three) times daily. 90 capsule 3  . glucose blood (ACCU-CHEK AVIVA) test strip Use to check capillary glucose TID prn for DM type 1 500 each 4  . Insulin Glargine (LANTUS SOLOSTAR) 100 UNIT/ML Solostar Pen Inject 45 Units into the skin daily. 15 mL 3  . Insulin Pen Needle (B-D UF III MINI PEN NEEDLES) 31G X 5 MM MISC To be used with novolog flexpen and lantus solostar.  Can substitute per formulary as compatible. 100 each 5  . Lancets (ACCU-CHEK SOFT TOUCH) lancets Use to check capillary glucose TID prn for DM1 500 each 4  . naproxen (NAPROSYN) 500 MG tablet Take 1 tablet (500 mg total) by mouth 2 (two) times daily with a meal. For 2 weeks then as needed. (Patient not taking: Reported on 09/20/2015) 60 tablet 0  .  SUMAtriptan (IMITREX) 100 MG tablet Take 1tablet at earliest onset. May repeat  2 hours if headache persists or recurs. Don't exceed 2 tablets in 24 hrs. Please reschedule f/u. (Patient not taking: Reported on 09/20/2015) 10 tablet 1   No current facility-administered medications on file prior to visit.    Past Medical History:  Diagnosis Date  . Anxiety   . Depression   . Diabetes mellitus    Type I  . HSV-2 (herpes simplex virus 2) infection 2012   serology      Review of Systems  Constitutional: Negative for fever.  Respiratory: Negative.   Cardiovascular: Negative.   Gastrointestinal: Negative.   Genitourinary: Positive for vaginal discharge.  Musculoskeletal: Positive for gait problem.       Right LL pain and weakness.  All other systems reviewed and are negative.      Vitals:   09/20/15 0824  BP: 119/68  Pulse: 93  Temp: 98.6 F (37 C)  TempSrc: Oral  SpO2: 96%  Weight: 171 lb (77.6 kg)    Objective:   Physical Exam  Constitutional: She appears well-developed. No distress.  Cardiovascular: Normal rate, regular rhythm and normal heart sounds.   No murmur heard. Pulmonary/Chest: Effort normal and breath sounds normal. No respiratory distress. She  has no wheezes.  Abdominal: Soft. Bowel sounds are normal. She exhibits no distension. There is no tenderness.  Genitourinary: Uterus normal. Cervix exhibits discharge. Cervix exhibits no motion tenderness and no friability. Right adnexum displays no tenderness and no fullness. Left adnexum displays no tenderness and no fullness. Vaginal discharge found.    Musculoskeletal: She exhibits no edema.       Right hip: Normal.       Left hip: Normal.       Right knee: Normal.       Left knee: Normal.       Right ankle: Normal.       Left ankle: Normal.  Uses walking cane to ambulate  Nursing note and vitals reviewed.      Assessment:     DM2: Leg problem: PAP:    Plan:     Check problem list.  Flu shot given  today for health maintenance.

## 2015-09-20 NOTE — Assessment & Plan Note (Signed)
PAP completed today. Wet prep done for vaginal discharge. I will contact her with results.

## 2015-09-20 NOTE — Assessment & Plan Note (Signed)
This is chronic issue. She had a normal xray of the knee in the past.  Her physical exam today is benign. Continue OTC pain regimen as needed. Physical therapy referral made today.

## 2015-09-20 NOTE — Assessment & Plan Note (Addendum)
A1C checked today was 8.9. Worsening DM control. I recommended increasing Lantus to 50 units qd. Continue current Novolog. Monitor CBG at home and call if less than 70. Urine microalbumin checked. Will call her with result. She will like trial of insulin pump for better glucose control. I will refer her to an endocrinologist to explore this option further. Continue home CBG monitoring.

## 2015-09-21 LAB — MICROALBUMIN / CREATININE URINE RATIO
Creatinine, Urine: 261 mg/dL (ref 20–320)
Microalb Creat Ratio: 7 mcg/mg creat (ref <30)
Microalb, Ur: 1.8 mg/dL

## 2015-09-24 ENCOUNTER — Telehealth: Payer: Self-pay | Admitting: Family Medicine

## 2015-09-24 LAB — CYTOLOGY - PAP

## 2015-09-24 MED ORDER — METRONIDAZOLE 500 MG PO TABS
500.0000 mg | ORAL_TABLET | Freq: Two times a day (BID) | ORAL | 0 refills | Status: AC
Start: 2015-09-24 — End: 2015-10-01

## 2015-09-24 NOTE — Telephone Encounter (Signed)
PAP report, Wet prep and Urine microalbumin discussed with her. Start Metronidazole, e-scribed. Urine microalbumin increased a little, unfortunately due to low BP in the past ACEi was held. I discussed tight glucose control. Referral already made to endocrinologist to discuss insulin pump.  She is now compliant with Lantus 50 units QD and Novolog 17 units TID . Her CBG still running in the 200s-400s.  I advised if this is the case to go up on her Novolog to 18 units TID in addition to Lantus 50 units.   F/U in the next few weeks for reassessment.

## 2015-09-26 ENCOUNTER — Ambulatory Visit: Payer: Medicaid Other | Admitting: Physical Therapy

## 2015-09-30 ENCOUNTER — Ambulatory Visit: Payer: Medicaid Other | Attending: Family Medicine | Admitting: Physical Therapy

## 2015-09-30 ENCOUNTER — Encounter: Payer: Self-pay | Admitting: Physical Therapy

## 2015-09-30 DIAGNOSIS — M6281 Muscle weakness (generalized): Secondary | ICD-10-CM | POA: Diagnosis not present

## 2015-09-30 DIAGNOSIS — R262 Difficulty in walking, not elsewhere classified: Secondary | ICD-10-CM | POA: Insufficient documentation

## 2015-09-30 DIAGNOSIS — R296 Repeated falls: Secondary | ICD-10-CM | POA: Diagnosis present

## 2015-09-30 NOTE — Telephone Encounter (Signed)
I called to follow up with patient on her new insulin regimen. She is now on Lantus 50 units daily in addition to Novolog 18 units TID. She stated her CBG has been in the 200s to 300s [id number] Few days ago she had 63 because she did not eat well that day, otherwise her numbers are pretty high.  I encouraged her to proper DM diet and also to have healthy snack in between meals to prevent hypoglycemia.  I think she can tolerate Lantus 55 units given her numbers.  She is advised to go up to Lantus 55 units qd in addition to her Novolog regimen. I again stress the importance of healthy meal. She is instructed to call me and hold insulin if glucose is less than 80. She verbalized understanding and agreed with plan.

## 2015-09-30 NOTE — Therapy (Signed)
[place], [place], [address] Phone: [phone]   Fax:  [phone]  Physical Therapy Evaluation  Patient Details  Name: Barbara Clark MRN: [id number] Date of Birth: 1975/12/29 Referring Provider: [name], MD  Encounter Date: 09/30/2015      PT End of Session - 09/30/15 1731    Visit Number 1   Authorization Type medicaid one-time eval   PT Start Time 1415   PT Stop Time 1500   PT Time Calculation (min) 45 min   Activity Tolerance Patient limited by pain   Behavior During Therapy Anxious      Past Medical History:  Diagnosis Date  . Anxiety   . Depression   . Diabetes mellitus    Type I  . HSV-2 (herpes simplex virus 2) infection 2012   serology    Past Surgical History:  Procedure Laterality Date  . TUBAL LIGATION      There were no vitals filed for this visit.       Subjective Assessment - 09/30/15 1446    Subjective 3-4 years ago pt reports losing pt in R leg following what was seemingly an allergic reaction. Has been diagnosed with diabetic neuropathy and has seen neurologist but denies outcome. Walks every day about 2 houses down the street and stopped going to the gym. sometimes uses walker. Denies sensory ability in R lower extremity.    Currently in Pain? Yes   Pain Location Leg   Pain Orientation Right            [facility] PT Assessment - 09/30/15 0001      Assessment   Medical Diagnosis limb weakness   Referring Provider [name], MD   Hand Dominance Right   Prior Therapy yes     Precautions   Precautions Fall     Restrictions   Weight Bearing Restrictions No     Balance Screen   Has the patient fallen in the past 6 months Yes     [place] residence   Living Arrangements Children     Cognition   Overall Cognitive Status Within Functional Limits for tasks assessed     ROM / Strength   AROM / PROM /  Strength Strength     Strength   Overall Strength Comments able to demo 3/5 grossly with significant difficulty     Ambulation/Gait   Assistive device Straight cane   Gait Comments cane on R due to frequent falls to R                            PT Education - 09/30/15 1730    Education provided Yes   Education Details anatomy of condition, exercise form/rationale, quad cane, gym & walking   Person(s) Educated Patient   Methods Explanation;Demonstration;Tactile cues;Verbal cues   Comprehension Verbalized understanding;Returned demonstration;Verbal cues required;Tactile cues required                    Plan - 09/30/15 1732    Clinical Impression Statement Pt presents to PT with complaints of R leg pain, weakness, lack of sensation and difficulty walking. Pt has returned to PT multiple times to be given some exercises but has not had any improvements or changes in function or sensations. I told the patient I was not going to have any new exercises for her and we worked on sit<>stand as  well as walking with quad cane for improved stability. Also discussed use of walker and attempting to walk with most "normal" gait pattern as possible. Instructed pt to contact us with any further questions.    Consulted and Agree with Plan of Care Patient      Patient will benefit from skilled therapeutic intervention in order to improve the following deficits and impairments:     Visit Diagnosis: Muscle weakness (generalized) - Plan: PT plan of care cert/re-cert  Difficulty in walking, not elsewhere classified - Plan: PT plan of care cert/re-cert  Repeated falls - Plan: PT plan of care cert/re-cert     Problem List Patient Active Problem List   Diagnosis Date Noted  . Left shoulder pain 05/07/2015  . Syncope and collapse   . Dizziness 11/13/2014  . 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 Menorrhagia 04/27/2014  . Chronic leg pain 04/27/2014   . DM neuropathy, type I diabetes mellitus ([place]) 03/13/2014  . Right leg weakness 02/20/2014  . Stuttering 02/02/2013  . Abnormality of gait 12/26/2012  . Encounter for routine gynecological examination 08/21/2010  . TOBACCO USER 10/27/2008  . Diabetes mellitus type I ([place]) 03/18/2006  . Major depressive disorder, recurrent episode ([place]) 03/18/2006  . Migraine headache 03/18/2006    Due to changes in the [facility] Policy for Rehab as of June 1st , 2014-- this patient does not have a qualifying diagnosis that is covered.  The patient is unable topay out of pocket expenses at this time, there fore will not be seen for treatment.  Ruthene Methvin C. Kresha Abelson PT, DPT 09/30/15 5:42 PM   [place], [place], [address] Phone: [phone]   Fax:  [phone]  Name: Kiaira Pakkala MRN: [id number] Date of Birth: April 07, 1975

## 2015-10-07 ENCOUNTER — Other Ambulatory Visit: Payer: Self-pay | Admitting: *Deleted

## 2015-10-07 MED ORDER — INSULIN GLARGINE 100 UNIT/ML SOLOSTAR PEN: 50.0000 [IU] | PEN_INJECTOR | Freq: Every day | SUBCUTANEOUS | 2 refills | Status: DC

## 2015-10-07 MED ORDER — INSULIN GLARGINE 100 UNIT/ML SOLOSTAR PEN: 55.0000 [IU] | PEN_INJECTOR | Freq: Every day | SUBCUTANEOUS | 2 refills | Status: DC

## 2015-10-07 MED ORDER — INSULIN ASPART 100 UNIT/ML FLEXPEN: PEN_INJECTOR | SUBCUTANEOUS | 2 refills | Status: DC

## 2015-10-07 NOTE — Addendum Note (Signed)
Addended by: Andrena Mews T on: 10/07/2015 12:03 PM   Modules accepted: Orders

## 2015-10-07 NOTE — Telephone Encounter (Signed)
Note Lantus 50 unit was initially E-scribed. After reviewing my documentation, she should be on 55 units hence this was reordered.  I called her pharmacy and spoke with Barnetta Chapel who confirmed that she has canceled Lantus 50 units initially e-scribed.

## 2015-10-28 ENCOUNTER — Telehealth: Payer: Self-pay | Admitting: Family Medicine

## 2015-10-28 NOTE — Telephone Encounter (Signed)
Pt voiced understanding and I scheduled her an apt for tomm morning at 1030a.

## 2015-10-28 NOTE — Telephone Encounter (Signed)
Please contact patient. She might need to go up on her insulin. Before we do that,please help her schedule appointment with me this week so I can review her home CBG report and adjust insulin as appropriate. Please check if she heard back from the endocrinology referral. Thanks.  Please advise her to go to the ED if having any symptoms.

## 2015-10-28 NOTE — Telephone Encounter (Signed)
Pt is calling because she has been having very high blood sugars from 300-400. She is also requesting a cane that has the three leg on the bottom. She can not afford to buy this but if this is prescribed by her doctor Medicaid would cover this. jw

## 2015-10-29 ENCOUNTER — Encounter: Payer: Self-pay | Admitting: Family Medicine

## 2015-10-29 ENCOUNTER — Ambulatory Visit (INDEPENDENT_AMBULATORY_CARE_PROVIDER_SITE_OTHER): Payer: Medicaid Other | Admitting: Family Medicine

## 2015-10-29 VITALS — BP 102/72 | HR 89 | Temp 98.7°F | Ht 67.0 in | Wt 178.6 lb

## 2015-10-29 DIAGNOSIS — R296 Repeated falls: Secondary | ICD-10-CM

## 2015-10-29 DIAGNOSIS — M25551 Pain in right hip: Secondary | ICD-10-CM | POA: Diagnosis not present

## 2015-10-29 DIAGNOSIS — R29898 Other symptoms and signs involving the musculoskeletal system: Secondary | ICD-10-CM

## 2015-10-29 DIAGNOSIS — E108 Type 1 diabetes mellitus with unspecified complications: Secondary | ICD-10-CM

## 2015-10-29 LAB — GLUCOSE, CAPILLARY: Glucose-Capillary: 136 mg/dL — ABNORMAL HIGH (ref 65–99)

## 2015-10-29 NOTE — Assessment & Plan Note (Signed)
Poorly controlled. Her CBG today was 135 ( she stated this was because she took her insulin in the morning and did not eat intentionally to bring her number down from 400). Patient advised to eat healthy diet when due. Increase lantus to 60 units qd from 55 units. Continue Novolog at 18 units TID. Continue home CBG check. If CBG is less than 90-100 consistently to go back to Lantus 55 units qd. Return precaution discussed, otherwise f/u with me in 2 wks.

## 2015-10-29 NOTE — Progress Notes (Signed)
Subjective:     Patient ID: Barbara Clark, female   DOB: October 11, 1975, 40 y.o.   MRN: [id number]  HPI DM1:  She is compliant with Lantus 55 units and Novolog 18 units TID. She stated despite meds compliant she has having CBG range from 300 to 400. This morning her CBG was 400+ when she woke, she took her insulin and did not have breakfast prior to coming to the clinic. She has been stressed lately and she recently recovered from URI. Otherwise she is doing well with diet and exercise as tolerated. Weakness: Here for follow up. She had fallen multiple times in the last month. Her last fall was yesterday at the store. She uses a walking cane which she felt is not helping with her balancing any longer. She endorsed right hip pain and knee pain worsening and associated with weakness. Her leg give out often. Current Outpatient Prescriptions on File Prior to Visit  Medication Sig Dispense Refill  . buPROPion (WELLBUTRIN XL) 150 MG 24 hr tablet Take 450 mg by mouth daily.     . carbamazepine (TEGRETOL) 200 MG tablet Take 200 mg by mouth 2 (two) times daily.    Marland Kitchen glucose blood (ACCU-CHEK AVIVA) test strip Use to check capillary glucose TID prn for DM type 1 500 each 4  . insulin aspart (NOVOLOG) 100 UNIT/ML FlexPen Inject 18 unit [place] in morning, 18 unit [place] in the afternoon and 18 units [place] at night. Each with meal. Hold if glucose is less than 100 3 pen 2  . Insulin Glargine (LANTUS SOLOSTAR) 100 UNIT/ML Solostar Pen Inject 55 Units into the skin daily. 3 pen 2  . Insulin Pen Needle (B-D UF III MINI PEN NEEDLES) 31G X 5 MM MISC To be used with novolog flexpen and lantus solostar.  Can substitute per formulary as compatible. 100 each 5  . Lancets (ACCU-CHEK SOFT TOUCH) lancets Use to check capillary glucose TID prn for DM1 500 each 4  . naproxen (NAPROSYN) 500 MG tablet Take 1 tablet (500 mg total) by mouth 2 (two) times daily with a meal. For 2 weeks then as needed. (Patient not taking: Reported on 09/20/2015) 60  tablet 0  . OLANZapine (ZYPREXA) 7.5 MG tablet Take 7.5 mg by mouth at bedtime.    . pregabalin (LYRICA) 50 MG capsule Take 1 capsule (50 mg total) by mouth 3 (three) times daily. 90 capsule 3  . SUMAtriptan (IMITREX) 100 MG tablet Take 1tablet at earliest onset. May repeat  2 hours if headache persists or recurs. Don't exceed 2 tablets in 24 hrs. Please reschedule f/u. (Patient not taking: Reported on 09/20/2015) 10 tablet 1   No current facility-administered medications on file prior to visit.    Past Medical History:  Diagnosis Date  . Anxiety   . Depression   . Diabetes mellitus    Type I  . HSV-2 (herpes simplex virus 2) infection 2012   serology   Vitals:   10/29/15 1032  BP: 102/72  Pulse: 89  Temp: 98.7 F (37.1 C)  TempSrc: Oral  Weight: 178 lb 9.6 oz (81 kg)  Height: 5\' 7"  (1.702 m)     Review of Systems  Respiratory: Negative.   Cardiovascular: Negative.   Gastrointestinal: Negative.   Musculoskeletal: Positive for arthralgias. Negative for joint swelling.       Right hip and knee pain. Lower limb weakness  All other systems reviewed and are negative.        Objective:   Physical  Exam  Constitutional: She is oriented to person, place, and time. She appears well-developed. No distress.  Cardiovascular: Normal rate, regular rhythm and normal heart sounds.   No murmur heard. Pulmonary/Chest: Effort normal and breath sounds normal. No respiratory distress. She has no wheezes. She exhibits no tenderness.  Abdominal: Soft. Bowel sounds are normal. She exhibits no distension.  Musculoskeletal:       Right hip: She exhibits decreased range of motion and tenderness. She exhibits no swelling, no crepitus and no laceration.       Left hip: She exhibits decreased range of motion. She exhibits normal strength, no tenderness and no swelling.       Right knee: She exhibits normal range of motion. Tenderness found.       Left knee: She exhibits normal range of motion. No  tenderness found.  Ambulating slowly with a cane. + Unsteady gait with some balance with the cane.  Neurological: She is alert and oriented to person, place, and time.  Nursing note and vitals reviewed.      Assessment:     DM1: Poorly controlled Hip pain and Lower limb weakness    Plan:     Check problem list.

## 2015-10-29 NOTE — Assessment & Plan Note (Signed)
Might be related to hip pathology given pain. Otherwise etiology unclear. She has had multiple PT with no major improvement. She has been using walking cane for support but still falls regularly using that. She will benefit from rolling walker. DME ordered. Fall precaution discussed. F/U as needed.

## 2015-10-29 NOTE — Patient Instructions (Signed)
It was nice seeing you today. Let us go up on your lantus to 60 units daily. Continue to check your glucose. If less than 90-100, go back to Lantus 55 units. Continue Novolog at 18 units TID. I will like to see you backk in 2 wks.

## 2015-10-29 NOTE — Assessment & Plan Note (Signed)
Chronic but worsening. ?? OA although she is pretty young. Xray ordered. In the mean time may use Tylenol or Ibuprofen as needed for pain. F/U as needed.

## 2015-11-12 ENCOUNTER — Ambulatory Visit: Payer: Self-pay | Admitting: Family Medicine

## 2015-11-15 ENCOUNTER — Other Ambulatory Visit: Payer: Self-pay | Admitting: *Deleted

## 2015-11-15 MED ORDER — INSULIN PEN NEEDLE 31G X 5 MM MISC: 5 refills | Status: DC

## 2015-11-22 ENCOUNTER — Ambulatory Visit (INDEPENDENT_AMBULATORY_CARE_PROVIDER_SITE_OTHER): Payer: Medicaid Other | Admitting: Family Medicine

## 2015-11-22 ENCOUNTER — Encounter: Payer: Self-pay | Admitting: Family Medicine

## 2015-11-22 VITALS — BP 108/64 | HR 100 | Temp 98.4°F | Wt 187.0 lb

## 2015-11-22 DIAGNOSIS — E108 Type 1 diabetes mellitus with unspecified complications: Secondary | ICD-10-CM | POA: Diagnosis present

## 2015-11-22 DIAGNOSIS — R29898 Other symptoms and signs involving the musculoskeletal system: Secondary | ICD-10-CM

## 2015-11-22 LAB — GLUCOSE, POCT (MANUAL RESULT ENTRY): POC Glucose: 239 mg/dl — AB (ref 70–99)

## 2015-11-22 NOTE — Patient Instructions (Signed)
It was nice seeing you today. Your CBG was 239. We will continue same regimen for now. Please see me back next Tuesday for reassessment or sooner if having any concern.

## 2015-11-22 NOTE — Assessment & Plan Note (Addendum)
Poorly controlled. CBG checked during today's visit was 239. Likely some medication poor adherence component to this. I again advised her to take Lantus 60 units qd plus Novolog 18 units TID with meal. Continue home CBG monitoring. Call if dropping below 80-100. To see endocrinologist in May for insulin pump. F/U 4 days.

## 2015-11-22 NOTE — Assessment & Plan Note (Signed)
Etiology unclear. Symptoms previously mostly on the right. Now on the left as well. She has had multiple PT sessions and home exercise instruction was given at that point. Advised to continue home exercise regimen. Tylenol or NSAID as needed for pain.  Use rolling walker for support and fall precaution. She did not get xray done as ordered during last visit. I will consider screening for myositis disorder. Will obtain muscle enzyme during next visit. Return precaution discussed.

## 2015-11-22 NOTE — Progress Notes (Signed)
Subjective:     Patient ID: Barbara Clark, female   DOB: 1975-06-20, 40 y.o.   MRN: [id number]  HPI  Patient came exteremly late. Advised to reschedule, however I saw her briefly. DM2:Patient stated she has been taking Lantus 55 units instead of 60 units as instructed. She stated she forgot she was meant to take 60 units. Her glucose is still running high. She stated she got a call from the endocrinologist and she has an upcoming appointment with them in May. Denies any other concern. Limb pain/Weakness:She got her rolling walker. She stated she still has right LL pain and weakness but now the left side is beginning to worsen. She already got PT this year.  Current Outpatient Prescriptions on File Prior to Visit  Medication Sig Dispense Refill  . buPROPion (WELLBUTRIN XL) 150 MG 24 hr tablet Take 450 mg by mouth daily.     . carbamazepine (TEGRETOL) 200 MG tablet Take 200 mg by mouth 2 (two) times daily.    Marland Kitchen glucose blood (ACCU-CHEK AVIVA) test strip Use to check capillary glucose TID prn for DM type 1 500 each 4  . insulin aspart (NOVOLOG) 100 UNIT/ML FlexPen Inject 18 unit [place] in morning, 18 unit [place] in the afternoon and 18 units [place] at night. Each with meal. Hold if glucose is less than 100 3 pen 2  . Insulin Glargine (LANTUS SOLOSTAR) 100 UNIT/ML Solostar Pen Inject 55 Units into the skin daily. 3 pen 2  . Insulin Pen Needle (B-D UF III MINI PEN NEEDLES) 31G X 5 MM MISC To be used with novolog flexpen and lantus solostar.  Can substitute per formulary as compatible. 100 each 5  . Lancets (ACCU-CHEK SOFT TOUCH) lancets Use to check capillary glucose TID prn for DM1 500 each 4  . naproxen (NAPROSYN) 500 MG tablet Take 1 tablet (500 mg total) by mouth 2 (two) times daily with a meal. For 2 weeks then as needed. (Patient not taking: Reported on 09/20/2015) 60 tablet 0  . OLANZapine (ZYPREXA) 7.5 MG tablet Take 7.5 mg by mouth at bedtime.    . pregabalin (LYRICA) 50 MG capsule Take 1 capsule (50 mg  total) by mouth 3 (three) times daily. 90 capsule 3  . SUMAtriptan (IMITREX) 100 MG tablet Take 1tablet at earliest onset. May repeat  2 hours if headache persists or recurs. Don't exceed 2 tablets in 24 hrs. Please reschedule f/u. (Patient not taking: Reported on 09/20/2015) 10 tablet 1   No current facility-administered medications on file prior to visit.    Past Medical History:  Diagnosis Date  . Anxiety   . Depression   . Diabetes mellitus    Type I  . HSV-2 (herpes simplex virus 2) infection 2012   serology   Vitals:   11/22/15 1145  BP: 108/64  Pulse: 100  Temp: 98.4 F (36.9 C)  TempSrc: Oral  SpO2: 100%  Weight: 187 lb (84.8 kg)     Review of Systems  Respiratory: Negative.   Cardiovascular: Negative.   Musculoskeletal:       Limb pain and weakness  All other systems reviewed and are negative.      Objective:   Physical Exam  Constitutional: She is oriented to person, place, and time. She appears well-developed. No distress.  Cardiovascular: Normal rate, regular rhythm and normal heart sounds.   No murmur heard. Pulmonary/Chest: Effort normal and breath sounds normal. No respiratory distress. She has no wheezes.  Musculoskeletal:  Steady gait with rolling  walker  Neurological: She is alert and oriented to person, place, and time.  Nursing note and vitals reviewed.      Assessment:     DM2: Poorly controlled Limb weakness and pain    Plan:     Check problem list.

## 2015-11-26 ENCOUNTER — Telehealth: Payer: Self-pay | Admitting: Family Medicine

## 2015-11-26 ENCOUNTER — Other Ambulatory Visit: Payer: Self-pay | Admitting: *Deleted

## 2015-11-26 MED ORDER — INSULIN GLARGINE 100 UNIT/ML SOLOSTAR PEN: 60.0000 [IU] | PEN_INJECTOR | Freq: Every day | SUBCUTANEOUS | 2 refills | Status: DC

## 2015-11-26 MED ORDER — INSULIN ASPART 100 UNIT/ML FLEXPEN: PEN_INJECTOR | SUBCUTANEOUS | 2 refills | Status: DC

## 2015-11-26 NOTE — Telephone Encounter (Signed)
Pt is calling because she needs an updated letter for her employer. She was going to get this on Friday when her appointment is but needs this now. Not sure what letter she is referring to. She said this from a while ago. jw

## 2015-11-27 ENCOUNTER — Encounter: Payer: Self-pay | Admitting: Family Medicine

## 2015-11-27 NOTE — Telephone Encounter (Signed)
I spoke with patient to confirm the letter she is talking about. Letter printed and placed up front for pick up. She stated she might come in today or tomorrow to pick it up. She also mentioned she is taking Ibuprofen for leg pain. I advised her not to take more than 400-600 mg at a time, it seems she was taking more than usual. Patient advised to get OTC Ibuprofen 200mg  and take 2 tablet every 8 hours as needed. She verbalized understanding. Her appointment was rescheduled till next Tuesday. She is aware of her appointment date and time.

## 2015-11-27 NOTE — Telephone Encounter (Signed)
Patient called back and states that it was the letter from 02/2014 and she just needs an updated date but no change in the information.  Her employer informed her that she needs one for this year.  Will forward to MD to advise.  Patient would like to have this letter before Friday but if she has to wait until then, she said she could.  Jazmin Hartsell,CMA

## 2015-11-27 NOTE — Telephone Encounter (Signed)
LM for patient, ok per DPR from 2014, asking her to call back and give [place] more information on what is needed in this letter.  I did inform her that the last letter written for her was in 2016 pertaining to her schooling.  Patient may have to wait until her appointment on Friday before this can be addressed. Jazmin Hartsell,CMA

## 2015-11-29 ENCOUNTER — Ambulatory Visit: Payer: Self-pay | Admitting: Family Medicine

## 2015-12-03 ENCOUNTER — Encounter: Payer: Self-pay | Admitting: Family Medicine

## 2015-12-03 ENCOUNTER — Ambulatory Visit (INDEPENDENT_AMBULATORY_CARE_PROVIDER_SITE_OTHER): Payer: Medicaid Other | Admitting: Family Medicine

## 2015-12-03 VITALS — BP 109/68 | HR 91 | Temp 98.3°F | Ht 67.0 in | Wt 186.0 lb

## 2015-12-03 DIAGNOSIS — G8929 Other chronic pain: Secondary | ICD-10-CM

## 2015-12-03 DIAGNOSIS — E108 Type 1 diabetes mellitus with unspecified complications: Secondary | ICD-10-CM

## 2015-12-03 DIAGNOSIS — M25562 Pain in left knee: Secondary | ICD-10-CM | POA: Diagnosis not present

## 2015-12-03 LAB — GLUCOSE, POCT (MANUAL RESULT ENTRY): POC Glucose: 79 mg/dl (ref 70–99)

## 2015-12-03 MED ORDER — DICLOFENAC SODIUM 1 % TD GEL: 4.0000 g | Freq: Four times a day (QID) | TRANSDERMAL | 1 refills | Status: DC

## 2015-12-03 NOTE — Assessment & Plan Note (Signed)
This is new complaints although has been ongoing for more than 4 wk. Knee xray ordered. Voltaren gel prescribed for pain. Home knee exercise recommended. I will contact her with xray report when she gets it done.

## 2015-12-03 NOTE — Assessment & Plan Note (Signed)
Poorly controlled DM. CBG today is around 70 although she has higher readings at home. She mentioned she had a reading of 600, however most glucometer do not read above 400. At this time, I will not go up on her insulin regimen. Continue home CBG monitoring on current dose pending endocrinology assessment for insulin pump. Return precaution discussed.

## 2015-12-03 NOTE — Patient Instructions (Addendum)
It was nice seeing you today. Your glucose looks good this morning. We will not change anything in your meds given that you still have some highs. Please see your endocrinologist soon.  We will get your knee xray today. We will have you start Voltaren gel for pain. I will contact you with xray report.   Knee Exercises Ask your health care provider which exercises are safe for you. Do exercises exactly as told by your health care provider and adjust them as directed. It is normal to feel mild stretching, pulling, tightness, or discomfort as you do these exercises, but you should stop right away if you feel sudden pain or your pain gets worse.Do not begin these exercises until told by your health care provider. STRETCHING AND RANGE OF MOTION EXERCISES  These exercises warm up your muscles and joints and improve the movement and flexibility of your knee. These exercises also help to relieve pain, numbness, and tingling. Exercise A: Knee Extension, Prone 1. Lie on your abdomen on a bed. 2. Place your left / right knee just beyond the edge of the surface so your knee is not on the bed. You can put a towel under your left / right thigh just above your knee for comfort. 3. Relax your leg muscles and allow gravity to straighten your knee. You should feel a stretch behind your left / right knee. 4. Hold this position for __________ seconds. 5. Scoot up so your knee is supported between repetitions. Repeat __________ times. Complete this stretch __________ times a day. Exercise B: Knee Flexion, Active  1. Lie on your back with both knees straight. If this causes back discomfort, bend your left / right knee so your foot is flat on the floor. 2. Slowly slide your left / right heel back toward your buttocks until you feel a gentle stretch in the front of your knee or thigh. 3. Hold this position for __________ seconds. 4. Slowly slide your left / right heel back to the starting position. Repeat  __________ times. Complete this exercise __________ times a day. Exercise C: Quadriceps, Prone  1. Lie on your abdomen on a firm surface, such as a bed or padded floor. 2. Bend your left / right knee and hold your ankle. If you cannot reach your ankle or pant leg, loop a belt around your foot and grab the belt instead. 3. Gently pull your heel toward your buttocks. Your knee should not slide out to the side. You should feel a stretch in the front of your thigh and knee. 4. Hold this position for __________ seconds. Repeat __________ times. Complete this stretch __________ times a day. Exercise D: Hamstring, Supine 1. Lie on your back. 2. Loop a belt or towel over the ball of your left / right foot. The ball of your foot is on the walking surface, right under your toes. 3. Straighten your left / right knee and slowly pull on the belt to raise your leg until you feel a gentle stretch behind your knee.  Do not let your left / right knee bend while you do this.  Keep your other leg flat on the floor. 4. Hold this position for __________ seconds. Repeat __________ times. Complete this stretch __________ times a day. STRENGTHENING EXERCISES  These exercises build strength and endurance in your knee. Endurance is the ability to use your muscles for a long time, even after they get tired. Exercise E: Quadriceps, Isometric  1. Lie on your back with your  left / right leg extended and your other knee bent. Put a rolled towel or small pillow under your knee if told by your health care provider. 2. Slowly tense the muscles in the front of your left / right thigh. You should see your kneecap slide up toward your hip or see increased dimpling just above the knee. This motion will push the back of the knee toward the floor. 3. For __________ seconds, keep the muscle as tight as you can without increasing your pain. 4. Relax the muscles slowly and completely. Repeat __________ times. Complete this exercise  __________ times a day. Exercise F: Straight Leg Raises - Quadriceps 1. Lie on your back with your left / right leg extended and your other knee bent. 2. Tense the muscles in the front of your left / right thigh. You should see your kneecap slide up or see increased dimpling just above the knee. Your thigh may even shake a bit. 3. Keep these muscles tight as you raise your leg 4-6 inches (10-15 cm) off the floor. Do not let your knee bend. 4. Hold this position for __________ seconds. 5. Keep these muscles tense as you lower your leg. 6. Relax your muscles slowly and completely after each repetition. Repeat __________ times. Complete this exercise __________ times a day. Exercise G: Hamstring, Isometric 1. Lie on your back on a firm surface. 2. Bend your left / right knee approximately __________ degrees. 3. Dig your left / right heel into the surface as if you are trying to pull it toward your buttocks. Tighten the muscles in the back of your thighs to dig as hard as you can without increasing any pain. 4. Hold this position for __________ seconds. 5. Release the tension gradually and allow your muscles to relax completely for __________ seconds after each repetition. Repeat __________ times. Complete this exercise __________ times a day. Exercise H: Hamstring Curls  If told by your health care provider, do this exercise while wearing ankle weights. Begin with __________ weights. Then increase the weight by 1 lb (0.5 kg) increments. Do not wear ankle weights that are more than __________. 1. Lie on your abdomen with your legs straight. 2. Bend your left / right knee as far as you can without feeling pain. Keep your hips flat against the floor. 3. Hold this position for __________ seconds. 4. Slowly lower your leg to the starting position. Repeat __________ times. Complete this exercise __________ times a day. Exercise I: Squats (Quadriceps) 1. Stand in front of a table, with your feet and  knees pointing straight ahead. You may rest your hands on the table for balance but not for support. 2. Slowly bend your knees and lower your hips like you are going to sit in a chair.  Keep your weight over your heels, not over your toes.  Keep your lower legs upright so they are parallel with the table legs.  Do not let your hips go lower than your knees.  Do not bend lower than told by your health care provider.  If your knee pain increases, do not bend as low. 3. Hold the squat position for __________ seconds. 4. Slowly push with your legs to return to standing. Do not use your hands to pull yourself to standing. Repeat __________ times. Complete this exercise __________ times a day. Exercise J: Wall Slides (Quadriceps)  1. Lean your back against a smooth wall or door while you walk your feet out 18-24 inches (46-61 cm) from it.  2. Place your feet hip-width apart. 3. Slowly slide down the wall or door until your knees bend __________ degrees. Keep your knees over your heels, not over your toes. Keep your knees in line with your hips. 4. Hold for __________ seconds. Repeat __________ times. Complete this exercise __________ times a day. Exercise K: Straight Leg Raises - Hip Abductors 1. Lie on your side with your left / right leg in the top position. Lie so your head, shoulder, knee, and hip line up. You may bend your bottom knee to help you keep your balance. 2. Roll your hips slightly forward so your hips are stacked directly over each other and your left / right knee is facing forward. 3. Leading with your heel, lift your top leg 4-6 inches (10-15 cm). You should feel the muscles in your outer hip lifting.  Do not let your foot drift forward.  Do not let your knee roll toward the ceiling. 4. Hold this position for __________ seconds. 5. Slowly return your leg to the starting position. 6. Let your muscles relax completely after each repetition. Repeat __________ times. Complete  this exercise __________ times a day. Exercise L: Straight Leg Raises - Hip Extensors 1. Lie on your abdomen on a firm surface. You can put a pillow under your hips if that is more comfortable. 2. Tense the muscles in your buttocks and lift your left / right leg about 4-6 inches (10-15 cm). Keep your knee straight as you lift your leg. 3. Hold this position for __________ seconds. 4. Slowly lower your leg to the starting position. 5. Let your leg relax completely after each repetition. Repeat __________ times. Complete this exercise __________ times a day. This information is not intended to replace advice given to you by your health care provider. Make sure you discuss any questions you have with your health care provider. Document Released: 11/19/2004 Document Revised: 09/30/2015 Document Reviewed: 11/11/2014 Elsevier Interactive Patient Education  2017 [employer].

## 2015-12-03 NOTE — Progress Notes (Signed)
Subjective:     Patient ID: Barbara Clark, female   DOB: March 14, 1975, 40 y.o.   MRN: [id number]  Knee Pain   Incident onset: more than 1 month but worsening in the last few weeks. The incident occurred at home. There was no injury mechanism (She fell twice last week on her right side). The pain is present in the left knee. The quality of the pain is described as aching. The pain is at a severity of 6/10. The pain is moderate. The pain has been constant since onset. Associated symptoms include an inability to bear weight. Pertinent negatives include no loss of motion. The symptoms are aggravated by movement and weight bearing. She has tried ice, NSAIDs and acetaminophen for the symptoms. The treatment provided mild relief.  DM2:Here for follow up. Home CBG check ranges from 200s to 300s. She had a value of 600s last Sat per patient. Endocrinologist appointment upcoming in May.  Current Outpatient Prescriptions on File Prior to Visit  Medication Sig Dispense Refill  . buPROPion (WELLBUTRIN XL) 150 MG 24 hr tablet Take 450 mg by mouth daily.     . carbamazepine (TEGRETOL) 200 MG tablet Take 200 mg by mouth 2 (two) times daily.    Marland Kitchen glucose blood (ACCU-CHEK AVIVA) test strip Use to check capillary glucose TID prn for DM type 1 500 each 4  . insulin aspart (NOVOLOG) 100 UNIT/ML FlexPen Inject 18 unit [place] in morning, 18 unit [place] in the afternoon and 18 units [place] at night. Each with meal. Hold if glucose is less than 100 3 pen 2  . Insulin Glargine (LANTUS SOLOSTAR) 100 UNIT/ML Solostar Pen Inject 60 Units into the skin daily. 3 pen 2  . Insulin Pen Needle (B-D UF III MINI PEN NEEDLES) 31G X 5 MM MISC To be used with novolog flexpen and lantus solostar.  Can substitute per formulary as compatible. 100 each 5  . Lancets (ACCU-CHEK SOFT TOUCH) lancets Use to check capillary glucose TID prn for DM1 500 each 4  . naproxen (NAPROSYN) 500 MG tablet Take 1 tablet (500 mg total) by mouth 2 (two) times daily with a meal.  For 2 weeks then as needed. (Patient not taking: Reported on 09/20/2015) 60 tablet 0  . OLANZapine (ZYPREXA) 7.5 MG tablet Take 7.5 mg by mouth at bedtime.    . pregabalin (LYRICA) 50 MG capsule Take 1 capsule (50 mg total) by mouth 3 (three) times daily. 90 capsule 3  . SUMAtriptan (IMITREX) 100 MG tablet Take 1tablet at earliest onset. May repeat  2 hours if headache persists or recurs. Don't exceed 2 tablets in 24 hrs. Please reschedule f/u. (Patient not taking: Reported on 09/20/2015) 10 tablet 1   No current facility-administered medications on file prior to visit.    Past Medical History:  Diagnosis Date  . Anxiety   . Depression   . Diabetes mellitus    Type I  . 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   Cardiovascular: Negative.   Gastrointestinal: Negative.   Musculoskeletal: Positive for arthralgias and gait problem. Negative for joint swelling.  All other systems reviewed and are negative.  Vitals:   12/03/15 0910  BP: 109/68  Pulse: 91  Temp: 98.3 F (36.8 C)  TempSrc: Oral  Weight: 186 lb (84.4 kg)  Height: 5\' 7"  (1.702 m)       Objective:   Physical Exam  Constitutional: She appears well-developed.  No distress.  Cardiovascular: Normal rate, regular rhythm and normal heart sounds.   No murmur heard. Pulmonary/Chest: Effort normal and breath sounds normal. No respiratory distress. She has no wheezes.  Abdominal: Soft. Bowel sounds are normal. She exhibits no distension and no mass. There is no tenderness.  Musculoskeletal: She exhibits no edema.       Right hip: She exhibits no swelling and no crepitus.       Right knee: She exhibits decreased range of motion. She exhibits no swelling and no erythema. Tenderness found.       Left knee: She exhibits decreased range of motion. She exhibits no swelling and no effusion. Tenderness found.  Nursing note and vitals reviewed.      Assessment:     Knee  pain DM2    Plan:     Check problem lis

## 2015-12-06 ENCOUNTER — Other Ambulatory Visit: Payer: Self-pay | Admitting: *Deleted

## 2015-12-16 ENCOUNTER — Ambulatory Visit (HOSPITAL_COMMUNITY)
Admission: RE | Admit: 2015-12-16 | Discharge: 2015-12-16 | Disposition: A | Discharge status: Home / Self Care | Payer: Medicaid Other | Source: Ambulatory Visit | Attending: Family Medicine | Admitting: Family Medicine

## 2015-12-16 DIAGNOSIS — M25551 Pain in right hip: Secondary | ICD-10-CM | POA: Insufficient documentation

## 2015-12-16 DIAGNOSIS — M25562 Pain in left knee: Secondary | ICD-10-CM | POA: Insufficient documentation

## 2015-12-16 DIAGNOSIS — G8929 Other chronic pain: Secondary | ICD-10-CM | POA: Diagnosis not present

## 2015-12-17 ENCOUNTER — Other Ambulatory Visit: Payer: Self-pay | Admitting: *Deleted

## 2015-12-17 MED ORDER — PREGABALIN 50 MG PO CAPS: 50.0000 mg | ORAL_CAPSULE | Freq: Three times a day (TID) | ORAL | 3 refills | Status: DC

## 2015-12-18 ENCOUNTER — Telehealth: Payer: Self-pay | Admitting: Family Medicine

## 2015-12-18 NOTE — Telephone Encounter (Signed)
Xray report discussed with patient. No acute abnormality found. She stated she feels better with pain medication now. We will consider referral to PT in the future. She is advised to see me back soon if this becomes bothersome. She verbalized understanding.

## 2016-01-03 ENCOUNTER — Ambulatory Visit: Payer: Self-pay | Admitting: Family Medicine

## 2016-01-08 ENCOUNTER — Other Ambulatory Visit: Payer: Self-pay | Admitting: *Deleted

## 2016-01-08 MED ORDER — INSULIN GLARGINE 100 UNIT/ML SOLOSTAR PEN: 60.0000 [IU] | PEN_INJECTOR | Freq: Every day | SUBCUTANEOUS | 2 refills | Status: DC

## 2016-01-28 ENCOUNTER — Ambulatory Visit (INDEPENDENT_AMBULATORY_CARE_PROVIDER_SITE_OTHER): Payer: Medicaid Other | Admitting: Family Medicine

## 2016-01-28 ENCOUNTER — Encounter: Payer: Self-pay | Admitting: Family Medicine

## 2016-01-28 VITALS — BP 126/64 | HR 97 | Ht 67.0 in | Wt 182.0 lb

## 2016-01-28 DIAGNOSIS — E108 Type 1 diabetes mellitus with unspecified complications: Secondary | ICD-10-CM | POA: Diagnosis not present

## 2016-01-28 DIAGNOSIS — R42 Dizziness and giddiness: Secondary | ICD-10-CM | POA: Diagnosis not present

## 2016-01-28 DIAGNOSIS — F339 Major depressive disorder, recurrent, unspecified: Secondary | ICD-10-CM

## 2016-01-28 DIAGNOSIS — E1040 Type 1 diabetes mellitus with diabetic neuropathy, unspecified: Secondary | ICD-10-CM | POA: Diagnosis present

## 2016-01-28 DIAGNOSIS — R5383 Other fatigue: Secondary | ICD-10-CM | POA: Insufficient documentation

## 2016-01-28 LAB — POCT GLYCOSYLATED HEMOGLOBIN (HGB A1C): Hemoglobin A1C: 9.2

## 2016-01-28 LAB — BASIC METABOLIC PANEL
BUN: 8 mg/dL (ref 7–25)
CO2: 23 mmol/L (ref 20–31)
Calcium: 8.8 mg/dL (ref 8.6–10.2)
Chloride: 104 mmol/L (ref 98–110)
Creat: 0.62 mg/dL (ref 0.50–1.10)
Glucose, Bld: 231 mg/dL — ABNORMAL HIGH (ref 65–99)
Potassium: 4.3 mmol/L (ref 3.5–5.3)
Sodium: 136 mmol/L (ref 135–146)

## 2016-01-28 LAB — CBC
HCT: 37.8 % (ref 35.0–45.0)
Hemoglobin: 12.3 g/dL (ref 11.7–15.5)
MCH: 30.8 pg (ref 27.0–33.0)
MCHC: 32.5 g/dL (ref 32.0–36.0)
MCV: 94.7 fL (ref 80.0–100.0)
MPV: 10.5 fL (ref 7.5–12.5)
Platelets: 402 10*3/uL — ABNORMAL HIGH (ref 140–400)
RBC: 3.99 MIL/uL (ref 3.80–5.10)
RDW: 12.8 % (ref 11.0–15.0)
WBC: 8.1 10*3/uL (ref 3.8–10.8)

## 2016-01-28 LAB — TSH: TSH: 2.2 mIU/L

## 2016-01-28 LAB — GLUCOSE, POCT (MANUAL RESULT ENTRY): POC Glucose: 202 mg/dl — AB (ref 70–99)

## 2016-01-28 NOTE — Assessment & Plan Note (Signed)
She is well established with a psychologist and a psychiatrist. They are managing her medication and counseling session. Per patient she has an upcoming psychology follow up for counseling. This she stated is the most helpful part of her treatment regimen. Continue Psych follow up as planned.

## 2016-01-28 NOTE — Patient Instructions (Addendum)
It was nice seeing you today. Your A1C went up. Please increase your Lantus to 65 units daily and Novolog to 20 units TID with meal. Please continue to check your CBG at home three times daily. Call if less than 70 or higher than 350. I will like to see you back in 3 months or sooner if you have any concern. Please ensure follow up with endocrinologist as planned.  For your Fatigue, we will check you for anemia. Please start Multivitamin to stimulate your appetite. BP looks good. We checked your BP in different position ,result is equivocal. We will do some lab work given your dizziness. If symptoms worsens please go to the ED. Please keep well hydrated and rest at home.   Hypotension As your heart beats, it forces blood through your body. This force is called blood pressure. If you have hypotension, you have low blood pressure. When your blood pressure is too low, you may not get enough blood to your brain. You may feel weak, feel light-headed, have a fast heartbeat, or even pass out (faint). Follow these instructions at home: Eating and drinking  Drink enough fluids to keep your pee (urine) clear or pale yellow.  Eat a healthy diet, and follow instructions from your doctor about eating or drinking restrictions. A healthy diet includes:  Fresh fruits and vegetables.  Whole grains.  Low-fat (lean) meats.  Low-fat dairy products.  Eat extra salt only as told. Do not add extra salt to your diet unless your doctor tells you to.  Eat small meals often.  Avoid standing up quickly after you eat. Medicines  Take over-the-counter and prescription medicines only as told by your doctor.  Follow instructions from your doctor about changing how much you take (the dosage) of your medicines, if this applies.  Do not stop or change your medicine on your own. General instructions  Wear compression stockings as told by your doctor.  Get up slowly from lying down or sitting.  Avoid hot  showers and a lot of heat as told by your doctor.  Return to your normal activities as told by your doctor. Ask what activities are safe for you.  Do not use any products that contain nicotine or tobacco, such as cigarettes and e-cigarettes. If you need help quitting, ask your doctor.  Keep all follow-up visits as told by your doctor. This is important. Contact a doctor if:  You throw up (vomit).  You have watery poop (diarrhea).  You have a fever for more than 2-3 days.  You feel more thirsty than normal.  You feel weak and tired. Get help right away if:  You have chest pain.  You have a fast or irregular heartbeat.  You lose feeling (get numbness) in any part of your body.  You cannot move your arms or your legs.  You have trouble talking.  You get sweaty or feel light-headed.  You faint.  You have trouble breathing.  You have trouble staying awake.  You feel confused. This information is not intended to replace advice given to you by your health care provider. Make sure you discuss any questions you have with your health care provider. Document Released: 04/01/2009 Document Revised: 09/24/2015 Document Reviewed: 09/24/2015 Elsevier Interactive Patient Education  2017 [employer].

## 2016-01-28 NOTE — Progress Notes (Signed)
Subjective:     Patient ID: Barbara Clark, female   DOB: 1975-07-01, 41 y.o.   MRN: [id number]  HPI DM2:Glucose still running high in the 400 despite compliance with her meds. She stated occasionally she will take Lantus 65 units instead of 60 units to bring down her glucose. She also take Novolog 19 units TID instead of 18 nits TID. She is yet to follow up with her endocrinologist, her appointment is scheduled in May.  Fatigue:C/O fatigue for 2 week. She just does not want to get up from her bed. There is an associated loss of appetite , she has been stressed out too much lately, she hs issues at home, money issue, oldest daughter moved to [place] and she is worried about her.  Dizziness:C/O occasional dizziness. No recent fall or LOC. Last fall was more than 2 weeks ago, her fall was due to her chronic leg pain and weakness. She uses a walker now which help some.Denies chest pain, no SOB. No N/V Depression:She sees a therapist and Psychiatrist. She is compliant with her meds. She was just started on Equetro ( Brand) and her Carbamazepine generic was D/C per patient. She was informed by Psych that this was the cause of her hyperglycemia.  Current Outpatient Prescriptions on File Prior to Visit  Medication Sig Dispense Refill  . buPROPion (WELLBUTRIN XL) 150 MG 24 hr tablet Take 450 mg by mouth daily.     . diclofenac sodium (VOLTAREN) 1 % GEL Apply 4 g topically 4 (four) times daily. 100 g 1  . insulin aspart (NOVOLOG) 100 UNIT/ML FlexPen Inject 18 unit [place] in morning, 18 unit [place] in the afternoon and 18 units [place] at night. Each with meal. Hold if glucose is less than 100 3 pen 2  . Insulin Glargine (LANTUS SOLOSTAR) 100 UNIT/ML Solostar Pen Inject 60 Units into the skin daily. 3 pen 2  . OLANZapine (ZYPREXA) 7.5 MG tablet Take 7.5 mg by mouth at bedtime.    . pregabalin (LYRICA) 50 MG capsule Take 1 capsule (50 mg total) by mouth 3 (three) times daily. 90 capsule 3  . acetaminophen (TYLENOL) 325 MG tablet  Take 650 mg by mouth every 6 (six) hours as needed for mild pain.    Marland Kitchen glucose blood (ACCU-CHEK AVIVA) test strip Use to check capillary glucose TID prn for DM type 1 500 each 4  . ibuprofen (ADVIL,MOTRIN) 200 MG tablet Take 200 mg by mouth every 6 (six) hours as needed for mild pain.    . Insulin Pen Needle (B-D UF III MINI PEN NEEDLES) 31G X 5 MM MISC To be used with novolog flexpen and lantus solostar.  Can substitute per formulary as compatible. 100 each 5  . Lancets (ACCU-CHEK SOFT TOUCH) lancets Use to check capillary glucose TID prn for DM1 500 each 4   No current facility-administered medications on file prior to visit.    Past Medical History:  Diagnosis Date  . Anxiety   . Depression   . Diabetes mellitus    Type I  . HSV-2 (herpes simplex virus 2) infection 2012   serology     Review of Systems  Constitutional: Positive for fatigue. Negative for fever.  Eyes: Negative.   Respiratory: Negative.   Cardiovascular: Negative.   Gastrointestinal: Negative.   Genitourinary: Negative.   Neurological: Positive for dizziness. Negative for tremors, seizures, speech difficulty and headaches.       Vitals:   01/28/16 1125  BP: 126/64  Pulse: 97  SpO2: 96%  Weight: 182 lb (82.6 kg)  Height: 5\' 7"  (1.702 m)     Objective:   Physical Exam  Constitutional: She is oriented to person, place, and time. She appears well-developed. No distress.  HENT:  Head: Normocephalic and atraumatic.  Eyes: Conjunctivae and EOM are normal. Pupils are equal, round, and reactive to light.  Neck: Neck supple.  Cardiovascular: Normal rate, regular rhythm and normal heart sounds.   No murmur heard. Pulmonary/Chest: Effort normal and breath sounds normal. No respiratory distress. She has no wheezes. She has no rales.  Abdominal: Soft. Bowel sounds are normal. She exhibits no distension and no mass. There is no tenderness.  Musculoskeletal: Normal range of motion. She exhibits no edema.   Neurological: She is alert and oriented to person, place, and time. No cranial nerve deficit.  Nursing note and vitals reviewed.      Assessment:     DM2 uncontrolled Fatigue Dizziness Depression    Plan:     Check problem list.

## 2016-01-28 NOTE — Assessment & Plan Note (Signed)
Uncontrolled. Seems to have been compliant in the last few visits. Fasting CBG today is 202. A1C of 9.2 which has worsened from the previous. I increased her Lantus to 65 units qd. Novolog to 20 units TID. Encourage follow up with endocrinologist as planned. COntinue home CBG monitoring. F/U as needed. I will see her otherwise in 3 months.

## 2016-01-28 NOTE — Assessment & Plan Note (Signed)
Likely multifactorial ( Depression, poor appetite, poorly controlled DM). She is compliant with her antidepressant hence not adjustment made at this time. (Deferring to psych). Hx of anemia in the past hence CBC checked today. I will call her with result. Recheck TSH, last checked in 2012. Bmet checked as well. I recommended MVI to improve appetite. DM meds adjusted. Orthostatic vitals equivocal. I however counsel her about this. Return precaution discussed. Advised to go to the ED if symptoms persists or worsens. She verbalized understanding and agreed with plan.

## 2016-01-28 NOTE — Assessment & Plan Note (Signed)
Likely related to her fatigue. Checking labs as above. Orthostatic vitals equivocal given one time rise in HR on standing and mildly drop in SBP. Counseling done about orthostatic hypotension. Advised ED visit if symptoms persists or worsens. She verbalized understanding.

## 2016-01-29 ENCOUNTER — Telehealth: Payer: Self-pay | Admitting: Family Medicine

## 2016-01-29 NOTE — Telephone Encounter (Signed)
Result discussed with patient. She stated she feels a little tired but better. She is advised to go to the ED if symptoms worsens. She agreed with plan.

## 2016-03-13 ENCOUNTER — Other Ambulatory Visit: Payer: Self-pay | Admitting: *Deleted

## 2016-03-13 MED ORDER — DICLOFENAC SODIUM 1 % TD GEL: 4.0000 g | Freq: Four times a day (QID) | TRANSDERMAL | 3 refills | Status: DC

## 2016-03-25 ENCOUNTER — Telehealth: Payer: Self-pay | Admitting: Family Medicine

## 2016-03-25 NOTE — Telephone Encounter (Signed)
Social security letter/questions form dropped off for at front desk for completion.  Verified that patient section of form has been completed.  Last DOS/WCC with PCP was 01/28/16.  Placed form in blue team folder to be completed by clinical staff.  Crista Luria

## 2016-03-27 ENCOUNTER — Encounter: Payer: Self-pay | Admitting: Family Medicine

## 2016-03-27 ENCOUNTER — Ambulatory Visit (INDEPENDENT_AMBULATORY_CARE_PROVIDER_SITE_OTHER): Payer: Medicaid Other | Admitting: Family Medicine

## 2016-03-27 ENCOUNTER — Telehealth: Payer: Self-pay | Admitting: Family Medicine

## 2016-03-27 VITALS — BP 110/70 | HR 97 | Temp 98.9°F | Wt 187.0 lb

## 2016-03-27 DIAGNOSIS — M25561 Pain in right knee: Secondary | ICD-10-CM | POA: Diagnosis not present

## 2016-03-27 DIAGNOSIS — E108 Type 1 diabetes mellitus with unspecified complications: Secondary | ICD-10-CM | POA: Diagnosis not present

## 2016-03-27 DIAGNOSIS — N760 Acute vaginitis: Secondary | ICD-10-CM | POA: Insufficient documentation

## 2016-03-27 DIAGNOSIS — R29898 Other symptoms and signs involving the musculoskeletal system: Secondary | ICD-10-CM | POA: Diagnosis not present

## 2016-03-27 LAB — BASIC METABOLIC PANEL WITH GFR
BUN: 11 mg/dL (ref 7–25)
CO2: 23 mmol/L (ref 20–31)
Calcium: 8.9 mg/dL (ref 8.6–10.2)
Chloride: 103 mmol/L (ref 98–110)
Creat: 0.62 mg/dL (ref 0.50–1.10)
GFR, Est African American: >89 mL/min (ref >=60)
GFR, Est Non African American: >89 mL/min (ref >=60)
Glucose, Bld: 259 mg/dL — ABNORMAL HIGH (ref 65–99)
Potassium: 4.6 mmol/L (ref 3.5–5.3)
Sodium: 136 mmol/L (ref 135–146)

## 2016-03-27 MED ORDER — FLUCONAZOLE 150 MG PO TABS: 150.0000 mg | ORAL_TABLET | Freq: Once | ORAL | 0 refills | Status: AC

## 2016-03-27 MED ORDER — NAPROXEN 500 MG PO TABS: 500.0000 mg | ORAL_TABLET | Freq: Two times a day (BID) | ORAL | 0 refills | Status: DC | PRN

## 2016-03-27 MED ORDER — KETOROLAC TROMETHAMINE 30 MG/ML IJ SOLN
30.0000 mg | Freq: Once | INTRAMUSCULAR | Status: AC
  Administered 2016-03-27: 30 mg via INTRAMUSCULAR

## 2016-03-27 NOTE — Telephone Encounter (Signed)
Left detailed message informing pt her prescriptions will be at her pharmacy shortly. Instructions given to call us if she has any issues getting her medications.

## 2016-03-27 NOTE — Assessment & Plan Note (Addendum)
Limb weakness with frequent fall. Likely due to decondition vs related to her severe depression which is managed by her psychiatrist. Apart from the weakness, no neurologic deficit. Previous CT head has been fine. As discussed with patient, we will go ahead and obtain MRI of her spine to r/o Myelopathy Patient last had PT few months ago. I referred her again. Naproxen prescribed prn pain. F/U as needed. I will complete her disability form once I get her spine image result.

## 2016-03-27 NOTE — Telephone Encounter (Signed)
Pt has an apt today with PCP, form given directly to Dr. Gwendlyn Deutscher.

## 2016-03-27 NOTE — Assessment & Plan Note (Signed)
Likely candida vaginitis based on his with hyperglycemia. Pelvic exam deferred due to right LL pain. Diflucan prescribed x 1 dose. Good GU hygiene recommended. F/U as needed.

## 2016-03-27 NOTE — Telephone Encounter (Signed)
Just e-scribing it now. Been working on other patients.

## 2016-03-27 NOTE — Progress Notes (Signed)
Patient ID: Barbara Clark, female   DOB: Dec 04, 1975, 41 y.o.   MRN: [id number]

## 2016-03-27 NOTE — Telephone Encounter (Signed)
Pt called because she went to her pharmacy to pick up her medication but it wasn't at the pharmacy. She thinks that she is suppose to be getting medication for a yeats infection and pain medication called in. jw

## 2016-03-27 NOTE — Patient Instructions (Signed)
It was nice seeing you. In addition to the talk about your frequent fall, I also worry about your glucose. Let us go up on your Lantus to 65 daily. Continue to check your capillary glucose TID. Call and stop insulin if less than 70. I will call you with MRI result of your back.   Fall Prevention in the Home Falls can cause injuries. They can happen to people of all ages. There are many things you can do to make your home safe and to help prevent falls. What can I do on the outside of my home?  Regularly fix the edges of walkways and driveways and fix any cracks.  Remove anything that might make you trip as you walk through a door, such as a raised step or threshold.  Trim any bushes or trees on the path to your home.  Use bright outdoor lighting.  Clear any walking paths of anything that might make someone trip, such as rocks or tools.  Regularly check to see if handrails are loose or broken. Make sure that both sides of any steps have handrails.  Any raised decks and porches should have guardrails on the edges.  Have any leaves, snow, or ice cleared regularly.  Use sand or salt on walking paths during winter.  Clean up any spills in your garage right away. This includes oil or grease spills. What can I do in the bathroom?  Use night lights.  Install grab bars by the toilet and in the tub and shower. Do not use towel bars as grab bars.  Use non-skid mats or decals in the tub or shower.  If you need to sit down in the shower, use a plastic, non-slip stool.  Keep the floor dry. Clean up any water that spills on the floor as soon as it happens.  Remove soap buildup in the tub or shower regularly.  Attach bath mats securely with double-sided non-slip rug tape.  Do not have throw rugs and other things on the floor that can make you trip. What can I do in the bedroom?  Use night lights.  Make sure that you have a light by your bed that is easy to reach.  Do not use any  sheets or blankets that are too big for your bed. They should not hang down onto the floor.  Have a firm chair that has side arms. You can use this for support while you get dressed.  Do not have throw rugs and other things on the floor that can make you trip. What can I do in the kitchen?  Clean up any spills right away.  Avoid walking on wet floors.  Keep items that you use a lot in easy-to-reach places.  If you need to reach something above you, use a strong step stool that has a grab bar.  Keep electrical cords out of the way.  Do not use floor polish or wax that makes floors slippery. If you must use wax, use non-skid floor wax.  Do not have throw rugs and other things on the floor that can make you trip. What can I do with my stairs?  Do not leave any items on the stairs.  Make sure that there are handrails on both sides of the stairs and use them. Fix handrails that are broken or loose. Make sure that handrails are as long as the stairways.  Check any carpeting to make sure that it is firmly attached to the stairs. Fix any  carpet that is loose or worn.  Avoid having throw rugs at the top or bottom of the stairs. If you do have throw rugs, attach them to the floor with carpet tape.  Make sure that you have a light switch at the top of the stairs and the bottom of the stairs. If you do not have them, ask someone to add them for you. What else can I do to help prevent falls?  Wear shoes that:  Do not have high heels.  Have rubber bottoms.  Are comfortable and fit you well.  Are closed at the toe. Do not wear sandals.  If you use a stepladder:  Make sure that it is fully opened. Do not climb a closed stepladder.  Make sure that both sides of the stepladder are locked into place.  Ask someone to hold it for you, if possible.  Clearly mark and make sure that you can see:  Any grab bars or handrails.  First and last steps.  Where the edge of each step  is.  Use tools that help you move around (mobility aids) if they are needed. These include:  Canes.  Walkers.  Scooters.  Crutches.  Turn on the lights when you go into a dark area. Replace any light bulbs as soon as they burn out.  Set up your furniture so you have a clear path. Avoid moving your furniture around.  If any of your floors are uneven, fix them.  If there are any pets around you, be aware of where they are.  Review your medicines with your doctor. Some medicines can make you feel dizzy. This can increase your chance of falling. Ask your doctor what other things that you can do to help prevent falls. This information is not intended to replace advice given to you by your health care provider. Make sure you discuss any questions you have with your health care provider. Document Released: 11/01/2008 Document Revised: 06/13/2015 Document Reviewed: 02/09/2014 Elsevier Interactive Patient Education  2017 [employer].

## 2016-03-27 NOTE — Assessment & Plan Note (Signed)
Poorly controlled despite medication compliance. Novolog was currently increased.Continue Novolog 20 units TID. Increase Lantus to 65 units qhs. Continue home CBG monitoring. She is advised to hold insulin and call me if CBG is less than 70 or if she is having hypoglycemic symptoms. She verbalized understanding.

## 2016-03-27 NOTE — Progress Notes (Signed)
Subjective:     Patient ID: Barbara Clark, female   DOB: 17-Jan-1976, 41 y.o.   MRN: [id number]  Fall  The accident occurred 3 to 5 days ago (She fell in her kitchen 5 days ago). Fall occurred: She was walking her rolling walker with a seat. Her right leg gave out and her walker turned over while she fell. She c/o B/L LL weakness but worse on the right given recent fall. She landed on hard floor. There was no blood loss. The point of impact was the right hip (right thigh). Pain location: She is now having pain in her right thigh and hip. pain is about 10/10 in severity. The pain is at a severity of 10/10. The pain is moderate. The symptoms are aggravated by ambulation, pressure on injury and standing. Pertinent negatives include no abdominal pain, bowel incontinence, fever, headaches, loss of consciousness, nausea, numbness or tingling. Associated symptoms comments: Swelling of her right thigh. She had some numbness the night she fell but has since resolved. She has tried NSAID ([place] salt) for the symptoms. The treatment provided mild relief.  She had hearing for disability last week and will like her form from her lawyer to be completed to support her disability application. DM2: She is compliant with Novolog 20 units TID and 60 units of Lantus QHS. She stated her CBG has been running high in the 500s. Her lowest was about a week ago in the 70s but this was because she used her medication without eating. Denies hypoglycemic episode. Denies any change in her diet. Vaginal discharge: [place] discharge x 1. No burning with urination, no change in urine color. She stated this usually occurs whenever she has high glucose over a period of time.  Current Outpatient Prescriptions on File Prior to Visit  Medication Sig Dispense Refill  . buPROPion (WELLBUTRIN XL) 150 MG 24 hr tablet Take 450 mg by mouth daily.     . carbamazepine (EQUETRO) 200 MG CP12 12 hr capsule Take 200 mg by mouth 2 (two) times daily.     . diclofenac sodium (VOLTAREN) 1 % GEL Apply 4 g topically 4 (four) times daily. 100 g 3  . OLANZapine (ZYPREXA) 7.5 MG tablet Take 7.5 mg by mouth at bedtime.    . pregabalin (LYRICA) 50 MG capsule Take 1 capsule (50 mg total) by mouth 3 (three) times daily. 90 capsule 3  . glucose blood (ACCU-CHEK AVIVA) test strip Use to check capillary glucose TID prn for DM type 1 500 each 4  . ibuprofen (ADVIL,MOTRIN) 200 MG tablet Take 200 mg by mouth every 6 (six) hours as needed for mild pain.    Marland Kitchen insulin aspart (NOVOLOG) 100 UNIT/ML FlexPen Inject 18 unit [place] in morning, 18 unit [place] in the afternoon and 18 units [place] at night. Each with meal. Hold if glucose is less than 100 (Patient taking differently: Inject 20unit [place] in morning, 20 unit [place] in the afternoon and 20 units [place] at night. Each with meal. Hold if glucose is less than 100) 3 pen 2  . Insulin Glargine (LANTUS SOLOSTAR) 100 UNIT/ML Solostar Pen Inject 60 Units into the skin daily. 3 pen 2  . Insulin Pen Needle (B-D UF III MINI PEN NEEDLES) 31G X 5 MM MISC To be used with novolog flexpen and lantus solostar.  Can substitute per formulary as compatible. 100 each 5  . Lancets (ACCU-CHEK SOFT TOUCH) lancets Use to check capillary glucose TID prn for DM1 500 each 4  No current facility-administered medications on file prior to visit.    Past Medical History:  Diagnosis Date  . Anxiety   . Depression   . Diabetes mellitus    Type I  . HSV-2 (herpes simplex virus 2) infection 2012   serology     Review of Systems  Constitutional: Negative for fever.  Respiratory: Negative.   Cardiovascular: Negative.   Gastrointestinal: Negative for abdominal pain, bowel incontinence and nausea.  Genitourinary: Positive for vaginal discharge.  Musculoskeletal: Positive for arthralgias.       Right thigh and knee pain S/P fall.  Neurological: Negative for tingling, loss of consciousness, numbness and headaches.  All other systems reviewed and are  negative.  Vitals:   03/27/16 1041  BP: 110/70  Pulse: 97  Temp: 98.9 F (37.2 C)  TempSrc: Oral  SpO2: 97%  Weight: 187 lb (84.8 kg)       Objective:   Physical Exam  Constitutional: She is oriented to person, place, and time. She appears well-developed. No distress.  Cardiovascular: Normal rate, regular rhythm and normal heart sounds.   No murmur heard. Pulmonary/Chest: Effort normal and breath sounds normal. No respiratory distress. She has no wheezes.  Abdominal: Soft. She exhibits no distension. There is no tenderness.  Genitourinary:  Genitourinary Comments: Deferred. She is in pain.  Neurological: She is alert and oriented to person, place, and time. She has normal reflexes. No cranial nerve deficit.  Nursing note and vitals reviewed.      Assessment:     Fall secondary to LL weakness DM2 uncontrolled Vaginitis    Plan:     Check problem list

## 2016-03-30 ENCOUNTER — Other Ambulatory Visit: Payer: Self-pay | Admitting: Family Medicine

## 2016-03-30 ENCOUNTER — Telehealth: Payer: Self-pay | Admitting: Family Medicine

## 2016-03-30 MED ORDER — FLUCONAZOLE 150 MG PO TABS: 150.0000 mg | ORAL_TABLET | Freq: Once | ORAL | 0 refills | Status: AC

## 2016-03-30 NOTE — Telephone Encounter (Signed)
Patient stated pharmacy informed her that her Diflucan was already picked up although she was adamant about not getting the medication. I will escribe again one time. She verbalized understanding.

## 2016-03-30 NOTE — Telephone Encounter (Signed)
Pt called because the pharmacy didn't give her the diflucan. They claim to have filled this and put in her bag when she picked up her other medications. She has called the pharmacy and they said that there is nothing they can do and for her to call her doctor and have a new prescription called in. So can we call another diflucan in/ jw

## 2016-04-08 ENCOUNTER — Encounter (HOSPITAL_COMMUNITY): Payer: Self-pay

## 2016-04-08 ENCOUNTER — Ambulatory Visit (HOSPITAL_COMMUNITY)
Admission: RE | Admit: 2016-04-08 | Discharge: 2016-04-08 | Disposition: A | Discharge status: Home / Self Care | Payer: Medicaid Other | Source: Ambulatory Visit | Attending: Family Medicine | Admitting: Family Medicine

## 2016-04-08 DIAGNOSIS — M4807 Spinal stenosis, lumbosacral region: Secondary | ICD-10-CM | POA: Insufficient documentation

## 2016-04-08 DIAGNOSIS — M5126 Other intervertebral disc displacement, lumbar region: Secondary | ICD-10-CM | POA: Insufficient documentation

## 2016-04-08 DIAGNOSIS — M4804 Spinal stenosis, thoracic region: Secondary | ICD-10-CM | POA: Diagnosis not present

## 2016-04-08 DIAGNOSIS — M8938 Hypertrophy of bone, other site: Secondary | ICD-10-CM | POA: Diagnosis not present

## 2016-04-08 DIAGNOSIS — R29898 Other symptoms and signs involving the musculoskeletal system: Secondary | ICD-10-CM | POA: Diagnosis not present

## 2016-04-08 DIAGNOSIS — M48061 Spinal stenosis, lumbar region without neurogenic claudication: Secondary | ICD-10-CM | POA: Diagnosis not present

## 2016-04-08 MED ORDER — GADOBENATE DIMEGLUMINE 529 MG/ML IV SOLN
20.0000 mL | Freq: Once | INTRAVENOUS | Status: AC | PRN
  Administered 2016-04-08: 18 mL via INTRAVENOUS

## 2016-04-09 ENCOUNTER — Telehealth: Payer: Self-pay | Admitting: Family Medicine

## 2016-04-09 NOTE — Telephone Encounter (Signed)
I discussed MRI spine result with patient. Not indicative of any pathology that explains her LL weakness. All question answered. She asked that I complete a letter to her lawayer regarding her leg weakness and her aid will pick it up. Letter written and placed at the front office.

## 2016-04-22 ENCOUNTER — Other Ambulatory Visit: Payer: Self-pay | Admitting: *Deleted

## 2016-04-22 MED ORDER — DICLOFENAC SODIUM 1 % TD GEL: 4.0000 g | Freq: Four times a day (QID) | TRANSDERMAL | 3 refills | Status: DC | PRN

## 2016-08-14 ENCOUNTER — Encounter: Payer: Self-pay | Admitting: Family Medicine

## 2016-08-14 ENCOUNTER — Ambulatory Visit (INDEPENDENT_AMBULATORY_CARE_PROVIDER_SITE_OTHER): Payer: Medicaid Other | Admitting: Family Medicine

## 2016-08-14 VITALS — BP 110/68 | HR 91 | Temp 99.4°F | Ht 67.0 in | Wt 186.0 lb

## 2016-08-14 DIAGNOSIS — M25551 Pain in right hip: Secondary | ICD-10-CM

## 2016-08-14 DIAGNOSIS — E1049 Type 1 diabetes mellitus with other diabetic neurological complication: Secondary | ICD-10-CM

## 2016-08-14 DIAGNOSIS — E108 Type 1 diabetes mellitus with unspecified complications: Secondary | ICD-10-CM

## 2016-08-14 DIAGNOSIS — E1040 Type 1 diabetes mellitus with diabetic neuropathy, unspecified: Secondary | ICD-10-CM

## 2016-08-14 LAB — POCT GLYCOSYLATED HEMOGLOBIN (HGB A1C): Hemoglobin A1C: 9.9

## 2016-08-14 MED ORDER — TRAMADOL HCL 50 MG PO TABS: 50.0000 mg | ORAL_TABLET | Freq: Two times a day (BID) | ORAL | 2 refills | Status: DC | PRN

## 2016-08-14 NOTE — Assessment & Plan Note (Addendum)
Seems to be compliant with meds. Still poorly controlled,. Increase Lantus to 65 units qd. Continue Novolog 20 units TID with meals. Med refilled. DM eye exam recommended. She will contact her ophthalmologist to schedule appointment.

## 2016-08-14 NOTE — Assessment & Plan Note (Signed)
Reduced sensation of her right foot. Referral to podiatrist done for reevaluation. Avoid trauma to foot.

## 2016-08-14 NOTE — Progress Notes (Signed)
Subjective:     Patient ID: Barbara Clark, female   DOB: 1975-03-10, 41 y.o.   MRN: [id number]  HPI DM2: CBG running high 400, dropped to 40 because she did not eat. Stressed out due to leg pain, bills and so on. She is compliant with Lantus 60 units qd and Novolog 20 units TID with meal. She has endocrinology appointment August 31st. Leg pain: Right leg pain worsening. Pain extends from her hip down to her ankle about 8/10 in severity. Denies any recent trauma. She fell x 3 in the past 2 weeks.  Doing home exercise. Last PT was late last year, she is able to get PT once a year. Currently on Voltaren gel, she stopped Naproxen. She had been on Tramadol in the past but self discontinued it. Now she want to get back on it since it is the only med that had helped with her pain so far.  Current Outpatient Prescriptions on File Prior to Visit  Medication Sig Dispense Refill  . buPROPion (WELLBUTRIN XL) 150 MG 24 hr tablet Take 450 mg by mouth daily.     . carbamazepine (EQUETRO) 200 MG CP12 12 hr capsule Take 200 mg by mouth 2 (two) times daily.    . diclofenac sodium (VOLTAREN) 1 % GEL Apply 4 g topically 4 (four) times daily as needed (knee pain). 100 g 3  . insulin aspart (NOVOLOG) 100 UNIT/ML FlexPen Inject 18 unit [place] in morning, 18 unit [place] in the afternoon and 18 units [place] at night. Each with meal. Hold if glucose is less than 100 (Patient taking differently: Inject 20 Units into the skin 3 (three) times daily with meals. Inject 18 unit [place] in morning, 18 unit [place] in the afternoon and 18 units [place] at night. Each with meal. Hold if glucose is less than 100) 3 pen 2  . OLANZapine (ZYPREXA) 7.5 MG tablet Take 7.5 mg by mouth at bedtime.    . pregabalin (LYRICA) 50 MG capsule Take 1 capsule (50 mg total) by mouth 3 (three) times daily. 90 capsule 3  . glucose blood (ACCU-CHEK AVIVA) test strip Use to check capillary glucose TID prn for DM type 1 500 each 4  . Insulin Glargine (LANTUS SOLOSTAR) 100 UNIT/ML  Solostar Pen Inject 60 Units into the skin daily. 3 pen 2  . Insulin Pen Needle (B-D UF III MINI PEN NEEDLES) 31G X 5 MM MISC To be used with novolog flexpen and lantus solostar.  Can substitute per formulary as compatible. 100 each 5  . Lancets (ACCU-CHEK SOFT TOUCH) lancets Use to check capillary glucose TID prn for DM1 500 each 4  . naproxen (NAPROSYN) 500 MG tablet Take 1 tablet (500 mg total) by mouth 2 (two) times daily as needed. (Patient not taking: Reported on 08/14/2016) 60 tablet 0   No current facility-administered medications on file prior to visit.    Past Medical History:  Diagnosis Date  . Anxiety   . Depression   . Diabetes mellitus    Type I  . HSV-2 (herpes simplex virus 2) infection 2012   serology   Vitals:   08/14/16 1128  BP: 110/68  Pulse: 91  Temp: 99.4 F (37.4 C)  TempSrc: Oral  SpO2: 98%  Weight: 186 lb (84.4 kg)  Height: 5\' 7"  (1.702 m)     Review of Systems  Constitutional: Negative.   Respiratory: Negative.   Cardiovascular: Negative.   Gastrointestinal: Negative.   Musculoskeletal: Positive for arthralgias and gait problem. Negative  for joint swelling.       Right leg pain  Psychiatric/Behavioral: Negative for self-injury and suicidal ideas.  All other systems reviewed and are negative.      Objective:   Physical Exam  Constitutional: She is oriented to person, place, and time. She appears well-developed. No distress.  Cardiovascular: Normal rate, regular rhythm and normal heart sounds.   No murmur heard. Pulmonary/Chest: Effort normal and breath sounds normal. No respiratory distress. She has no wheezes.  Abdominal: Soft. Bowel sounds are normal. She exhibits no distension and no mass. There is no tenderness.  Musculoskeletal: She exhibits no edema.       Right hip: She exhibits decreased range of motion and tenderness. She exhibits no swelling and no deformity.       Right knee: She exhibits decreased range of motion. She exhibits no  swelling. Tenderness found.  Ambulates with a rolling walker.  Sensory exam of the foot is normal on the left with decrease sensation to monofilament testing on the right. Good pulses, no lesions or ulcers, good peripheral pulses.   Neurological: She is alert and oriented to person, place, and time. She has normal reflexes. No cranial nerve deficit.  Nursing note and vitals reviewed.      Assessment:     DM1: Poorly controlled Right leg pain DM neuropathy    Plan:     Check problem list.

## 2016-08-14 NOTE — Assessment & Plan Note (Signed)
Right hip and knee pain. Etiology unclear. ?? Overuse. Xray had not shown any abnormality. Consider MRI in the future. Restart Tramadol which I called in to her pharmacy. Fall precaution and home exercise discussed. Continue rolling walker for support. F/U as needed.

## 2016-08-14 NOTE — Patient Instructions (Signed)
It was nice seeing you today. Your A1C is still high. Please take lantus 65 units daily in addition to your Novolog. See your endocrinologist as planned.

## 2016-08-17 ENCOUNTER — Telehealth: Payer: Self-pay | Admitting: Family Medicine

## 2016-08-17 MED ORDER — INSULIN ASPART 100 UNIT/ML FLEXPEN: PEN_INJECTOR | SUBCUTANEOUS | 2 refills | Status: DC

## 2016-08-17 MED ORDER — INSULIN GLARGINE 100 UNIT/ML SOLOSTAR PEN: 65.0000 [IU] | PEN_INJECTOR | Freq: Every day | SUBCUTANEOUS | 2 refills | Status: DC

## 2016-08-17 NOTE — Telephone Encounter (Signed)
Refill sent.

## 2016-08-17 NOTE — Addendum Note (Signed)
Addended by: Andrena Mews T on: 08/17/2016 11:46 AM   Modules accepted: Orders

## 2016-08-17 NOTE — Telephone Encounter (Signed)
Pt called and would like a refill on her Novolog sent in. jw

## 2016-09-10 ENCOUNTER — Ambulatory Visit (INDEPENDENT_AMBULATORY_CARE_PROVIDER_SITE_OTHER): Payer: Medicaid Other | Admitting: Podiatry

## 2016-09-10 ENCOUNTER — Ambulatory Visit (INDEPENDENT_AMBULATORY_CARE_PROVIDER_SITE_OTHER): Payer: Medicaid Other

## 2016-09-10 ENCOUNTER — Encounter: Payer: Self-pay | Admitting: Podiatry

## 2016-09-10 DIAGNOSIS — M79671 Pain in right foot: Secondary | ICD-10-CM

## 2016-09-10 DIAGNOSIS — R29898 Other symptoms and signs involving the musculoskeletal system: Secondary | ICD-10-CM | POA: Diagnosis not present

## 2016-09-10 DIAGNOSIS — E1040 Type 1 diabetes mellitus with diabetic neuropathy, unspecified: Secondary | ICD-10-CM | POA: Diagnosis not present

## 2016-09-10 NOTE — Progress Notes (Signed)
Subjective:    Patient ID: Barbara Clark, female   DOB: 41 y.o.   MRN: [id number]   HPI Barbara Clark Presents to the office today for concerns her right foot numbness and weakness. She said a ongoing for some time. She states that she has been told that she is depressed about is why she is getting this to her leg. She has to walk with a walker to help prevent falls. She denies any recent injury or trauma. She denies any back, hip, knee pain and this time. She has no other concerns today.   Review of Systems  All other systems reviewed and are negative.       Objective:  Physical Exam General: AAO x3, NAD  Dermatological: Skin is warm, dry and supple bilateral. There are no open sores, no preulcerative lesions, no rash or signs of infection present.  Vascular: Dorsalis Pedis artery and Posterior Tibial artery pedal pulses are 2/4 bilateral with immedate capillary fill time.  There is no pain with calf compression, swelling, warmth, erythema.   Neruologic: Sensation decreased with Barbara Clark monofilament, decreased vibratory sensation.  Musculoskeletal: There is significant weakness presents the right leg and both dorsiflexion and plantarflexion. This is chronic. Is no area pinpoint tenderness there is no pain the vibratory sensation. Ankle, subtalar joint range of motion intact.  Gait: Unassisted, Nonantalgic.      Assessment:     Right leg weakness, type I diabetic with neuropathy    Plan:     -Treatment options discussed including all alternatives, risks, and complications -Etiology of symptoms were discussed -At this point I recommended a nerve conduction test. This was ordered today. -For now continue to walk with walker to help prevent any falls. -Discussed daily foot inspection. -Follow-up after nerve conduction test or sooner if needed. Call any questions or concerns.

## 2016-09-11 ENCOUNTER — Telehealth: Payer: Self-pay | Admitting: *Deleted

## 2016-09-11 DIAGNOSIS — M79671 Pain in right foot: Secondary | ICD-10-CM

## 2016-09-11 NOTE — Telephone Encounter (Addendum)
-----   Message from Trula Slade, DPM sent at 09/10/2016  3:04 PM EDT ----- Can you please order a NCV for her? She has weakness in the right leg and she has numbness. Thanks. 09/11/2016-Faxed orders to [facility].

## 2016-09-14 ENCOUNTER — Encounter: Payer: Self-pay | Admitting: Neurology

## 2016-09-22 ENCOUNTER — Encounter: Payer: Self-pay | Admitting: Neurology

## 2016-10-01 ENCOUNTER — Ambulatory Visit (INDEPENDENT_AMBULATORY_CARE_PROVIDER_SITE_OTHER): Payer: Medicaid Other | Admitting: Neurology

## 2016-10-01 DIAGNOSIS — M79671 Pain in right foot: Secondary | ICD-10-CM | POA: Diagnosis not present

## 2016-10-01 NOTE — Procedures (Signed)
[facility]  [address]  [place], [place] [address] Tel: [phone] Fax:  [phone] Test Date:  10/01/2016  Patient: Barbara Clark DOB: 07-Oct-1975 Physician: Narda Amber, DO  Sex: Female Height: 5\' 7"  Ref Phys: Celesta Gentile, DPM  ID#: [id number] Temp: 34.6C Technician:    Patient Complaints: This is a 41 year old type I diabetic patient referred for evaluation of right leg weakness and numbness.  NCV & EMG Findings: Extensive electrodiagnostic testing of the right lower extremity shows:  1. Right sural and superficial peroneal sensory responses are within normal limits. 2. Right peroneal and tibial motor responses are within normal limits. 3. There is no evidence of active or chronic motor axon loss changes affecting any of the tested muscles. There is a global pattern of incomplete motor unit activation is seen by a variable firing motor unit recruitment, which may be due to pain, poor effort, or central disorder of motor unit control.  Impression: This is a normal study of the right lower extremity.   In particular, there is no evidence of a large fiber sensorimotor polyneuropathy or lumbosacral radiculopathy.  There is a global pattern of incomplete motor unit activation is seen by a variable firing motor unit recruitment, which may be due to pain, poor effort, or central disorder of motor unit control.   ___________________________ Narda Amber, DO    Nerve Conduction Studies Anti Sensory Summary Table   Site NR Peak (ms) Norm Peak (ms) P-T Amp (V) Norm P-T Amp  Right Sup Peroneal Anti Sensory (Ant Lat Mall)  12 cm    2.5 <4.5 6.3 >5  Right Sural Anti Sensory (Lat Mall)  Calf    3.2 <4.5 7.7 >5   Motor Summary Table   Site NR Onset (ms) Norm Onset (ms) O-P Amp (mV) Norm O-P Amp Site1 Site2 Delta-0 (ms) Dist (cm) Vel (m/s) Norm Vel (m/s)  Right Peroneal Motor (Ext Dig Brev)  Ankle    3.4 <5.5 6.3 >3 B Fib Ankle 8.5 39.0 46 >40  B  Fib    11.9  5.9  Poplt B Fib 1.2 7.0 58 >40  Poplt    13.1  5.7         Right Peroneal TA Motor (Tib Ant)  Fib Head    2.8 <4.0 4.9 >4 Poplit Fib Head 1.2 7.0 58 >40  Poplit    4.0  4.8         Right Tibial Motor ([place] Brev)  Ankle    3.0 <6.0 8.1 >8 Knee Ankle 9.7 38.0 40 >40  Knee    12.7  5.3          H Reflex Studies   NR H-Lat (ms) Lat Norm (ms) L-R H-Lat (ms)  Right Tibial (Gastroc)     34.83 <35    EMG   Side Muscle Ins Act Fibs Psw Fasc Number Recrt Dur Dur. Amp Amp. Poly Poly. Comment  Right AntTibialis Nml Nml Nml Nml 1- Mod-V Nml Nml Nml Nml Nml Nml N/A  Right Gastroc Nml Nml Nml Nml 2- Mod-V Nml Nml Nml Nml Nml Nml N/A  Right Flex Dig Long Nml Nml Nml Nml 1- Mod-V Nml Nml Nml Nml Nml Nml N/A  Right RectFemoris Nml Nml Nml Nml 1- Mod-V Nml Nml Nml Nml Nml Nml N/A  Right GluteusMed Nml Nml Nml Nml 1- Mod-V Nml Nml Nml Nml Nml Nml N/A  Right BicepsFemS Nml Nml Nml Nml 1- Mod-V Nml Nml Nml Nml  Nml Nml N/A      Waveforms:

## 2016-10-02 NOTE — Telephone Encounter (Signed)
-----   Message from Trula Slade, DPM sent at 10/02/2016  1:01 PM EDT ----- Tivis Ringer- Can you please put in for a neurology consult and let her know? She had quite a bit of weakness in the right leg and given the findings I would like for her to be checked by a neurologist. Thanks.

## 2016-10-12 ENCOUNTER — Other Ambulatory Visit: Payer: Self-pay | Admitting: *Deleted

## 2016-10-12 MED ORDER — DICLOFENAC SODIUM 1 % TD GEL: 4.0000 g | Freq: Four times a day (QID) | TRANSDERMAL | 3 refills | Status: DC | PRN

## 2016-11-04 ENCOUNTER — Encounter: Payer: Medicaid Other | Attending: Internal Medicine | Admitting: *Deleted

## 2016-11-04 DIAGNOSIS — E109 Type 1 diabetes mellitus without complications: Secondary | ICD-10-CM | POA: Insufficient documentation

## 2016-11-04 DIAGNOSIS — Z713 Dietary counseling and surveillance: Secondary | ICD-10-CM | POA: Insufficient documentation

## 2016-11-04 DIAGNOSIS — Z794 Long term (current) use of insulin: Secondary | ICD-10-CM | POA: Diagnosis not present

## 2016-11-04 DIAGNOSIS — E1040 Type 1 diabetes mellitus with diabetic neuropathy, unspecified: Secondary | ICD-10-CM

## 2016-11-04 NOTE — Patient Instructions (Signed)
   We have looked at different pumps today, you have chosen Medtronic 630G.  I have emailed the rep and they will contact you to start the process  We reviewed Carb Counting by food group today, you have done a good job showing that you understand it.  Consider aiming for 3 carb choices per meal (45 grams) +/- 1 either way to give your body more consistent BG's with your 16 units of insulin per meal.  Let our office know when your pump ships so we can set up training.

## 2016-11-04 NOTE — Progress Notes (Signed)
Insulin Pump and / or CGM Evaluation Visit:  Date: 11/04/2016   Appt start time: 1400 end time:  1530.  Assessment:  This patient has DM 1 and their primary concerns today: Carb Counting review and learn more about insulin pumps.  Usual physical activity: limited due to walking with cane Patient currently is working NO  Patient states knowledge of Carb Counting is 3 on a scale of 1-10 Patient states they are currently testing BG 3 times per day Patient does not currently have Ketone Strips  Last A1c was not provided   MEDICATIONS: Basal Insulin: 55 units of Lantus at night via pen  Bolus Insulin: 16-20 units of Novolog at each meal  via pen  Total Daily Dose of insulin per day 120 Other diabetes medications: none  Progress Towards Obtaining an Insulin Pump Goal(s):   Patient states their expectations of pump therapy include: better BG control and quality of life Patient expresses understanding that for improved outcomes for their diabetes on an insulin pump they will:  During the first 1-2 weeks (or more) of new pump use, the patient will test BG pre and 2 hour post each meal, bedtime and 3 AM to assist the provider in evaluating accuracy of pump settings and to prevent DKA   Will test BG at least 4 times per day once evaluation time is completed  Change out pump infusion set at least every 3 days  Upload pump information to their pump's Web Based Program on a regular basis so provider can assess patterns and make setting adjustments.     Intervention:    Taught difference between delivery of insulin via syringe/pen compared to insulin pump.  Demonstrated improved insulin delivery via pump due to improved accuracy of dose and flexibility of adjusting bolus insulin based on carb intake and BG correction.  Showed patient the following pumps: Medtronic, OmniPod,   Demonstrated pump, insulin reservoir and infusion set options, and button pushing for bolus delivery of insulin through  the pump  Explained importance of testing BG at least 4 times per day for appropriate correction of high BG and prevention of DKA as applicable.  Emphasized importance of follow up after Pump Start for appropriate pump setting adjustments and on-going training on more advanced features.  Did not discuss CGM as patient is on Medicaid  Carb Counting by Food Group and how to read Food Labels  Plan:  We have looked at different pumps today, you have chosen Medtronic 630G.  I have emailed the rep and they will contact you to start the process  We reviewed Carb Counting by food group today, you have done a good job showing that you understand it.  Consider aiming for 3 carb choices per meal (45 grams) +/- 1 either way to give your body more consistent BG's with your 16 units of insulin per meal.  Let our office know when your pump ships so we can set up training.   Handouts given during visit include:  Introduction to Pump Therapy Handout  DM 1/Pump Support Group Flyer  Insulin Pump Packet from Medtronic per her request  Carb Counting handout   Monitoring/Evaluation:    Patient does want to continue with pursuit of Medtronic 630G insulin pump.  Patient asked me to contact local [place], which I did, so they can start the process of obtaining the pump. Contact information provided to the patient.  Once pump is shipped, patient to call this office to set up training.

## 2016-11-09 ENCOUNTER — Encounter: Payer: Self-pay | Admitting: Neurology

## 2016-11-09 ENCOUNTER — Ambulatory Visit (INDEPENDENT_AMBULATORY_CARE_PROVIDER_SITE_OTHER): Payer: Medicaid Other | Admitting: Neurology

## 2016-11-09 VITALS — BP 100/70 | HR 87 | Ht 67.0 in | Wt 191.8 lb

## 2016-11-09 DIAGNOSIS — R202 Paresthesia of skin: Secondary | ICD-10-CM

## 2016-11-09 DIAGNOSIS — R200 Anesthesia of skin: Secondary | ICD-10-CM | POA: Diagnosis not present

## 2016-11-09 DIAGNOSIS — R29898 Other symptoms and signs involving the musculoskeletal system: Secondary | ICD-10-CM

## 2016-11-09 NOTE — Progress Notes (Addendum)
NEUROLOGY FOLLOW UP OFFICE NOTE  Barbara Clark [id number]  HISTORY OF PRESENT ILLNESS: Barbara Clark is a 41 year old woman with history of type 1 diabetes with neuropathy, migraine, depression and anxiety whom I previously saw for migraines, presents for right foot pain and weakness.  History supplemented by podiatry note.  Barbara Clark is a 41 year old right-handed woman with history of type I diabetes mellitus who presents with episode of stuttering and gait instability.  Records and images were personally reviewed where available.    She initially presented to the ED in November 2014 with generalized pruritus, thought to be an allergic reaction but of unclear etiology.  She did not have any signs of anaphylaxis.  She received Benadryl and Solumedrol.  While in the ED, she had syncopal episode.  Afterwards, she exhibited stuttering speech, however she was able to write without difficulty.  She presented with difficulty with speech production and reportedly would stay "nananana" and "fafafa" when she would begin talking.  This was inconsistent and would at times speak clearing.  She also presented with abnormal gait and fall, which semiology suggested a non-organic etiology.  She appeared to exhibit weakness, however neurological testing revealed strength intact.  It was also mentioned that at some point she had "left sided neglect" but symptomatology was not elaborated.  She did undergo a workup.  MRI of brain was normal.  XR of lumbar spine, sacrum and coccyx were normal.  Neurology was consulted and found that her symptoms were an acute stress reaction.  Psychiatry was consulted and she was diagnosed with depression and anxiety.  She was started on Lexapro and Buspirone but later was discontinued and was taking Saphris.  She did have one episode of hypoglycemia with glucose level of 59.  She underwent physical therapy and occupational therapy.  She has since been using a walker.  She has been  unable to work.  She still reports having multiple falls, usually because her right leg gives out.  She still has intermittent stuttering, as well as intermittent tremor primarily of the right upper extremity, exacerbated with stress.  She denies back pain and shooting pain down the legs.  She denies numbness in the legs.  She denies bowel or bladder incontinence and retention.  She notes occasional diffuse cramping pain in legs.  Since discharge, she has had uncontrolled blood sugars (400s and 500s), thought to be due to a UTI and Saphris.   Since then, she continues to have right lower extremity weakness, pain and numbness.  The numbness involves the entire right lower extremity.  She denies neck and back pain.  She denies involvement of the upper extremities.  MRI of thoracic and lumbar spine with and without contrast from 04/08/16 was personally reviewed and demonstrated no cord signal abnormality, no significant stenosis of the hthracic spine and mild disc protrusion causing subarticular and foraminal narrowing bilaterally at L4-5 (worse on left) and asymmetric left sided mild left foraminal narrowing at L5-S1.  She had a NCV-EMG of the right lower extremity on 10/01/16, which was normal.  The Clark demonstrated incomplete motor unit activation suggesting decreased effort, limitation due to pain or due to central origin.  She has history of uncontrolled diabetes.  Last A1c from 08/14/16 was 9.9.    PAST MEDICAL HISTORY: Past Medical History:  Diagnosis Date  . Anxiety   . Depression   . Diabetes mellitus    Type I  . HSV-2 (herpes simplex virus 2) infection 2012  serology    MEDICATIONS: Current Outpatient Prescriptions on File Prior to Visit  Medication Sig Dispense Refill  . ARIPiprazole (ABILIFY) 5 MG tablet Take 1 1/2 tablet by mouth every morning  2  . benztropine (COGENTIN) 0.5 MG tablet Take 0.5 mg by mouth daily.  2  . diclofenac sodium (VOLTAREN) 1 % GEL Apply 4 g topically 4 (four)  times daily as needed (knee pain). 100 g 3  . glucose blood (ACCU-CHEK AVIVA) Clark strip Use to check capillary glucose TID prn for DM type 1 500 each 4  . insulin aspart (NOVOLOG) 100 UNIT/ML FlexPen Inject 20 unit [place] in morning, 20 unit [place] in the afternoon and 20 units [place] at night. Each with meal. Hold if glucose is less than 100 3 pen 2  . Insulin Glargine (LANTUS SOLOSTAR) 100 UNIT/ML Solostar Pen Inject 65 Units into the skin daily. 3 pen 2  . Insulin Pen Needle (B-D UF III MINI PEN NEEDLES) 31G X 5 MM MISC To be used with novolog flexpen and lantus solostar.  Can substitute per formulary as compatible. 100 each 5  . Lancets (ACCU-CHEK SOFT TOUCH) lancets Use to check capillary glucose TID prn for DM1 500 each 4  . mirtazapine (REMERON) 15 MG tablet Take 15 mg by mouth at bedtime.  2  . OLANZapine (ZYPREXA) 7.5 MG tablet Take 7.5 mg by mouth at bedtime.    . pregabalin (LYRICA) 50 MG capsule Take 1 capsule (50 mg total) by mouth 3 (three) times daily. 90 capsule 3  . TEGRETOL 200 MG tablet Take 200 mg by mouth 3 (three) times daily.  2  . traMADol (ULTRAM) 50 MG tablet Take 1 tablet (50 mg total) by mouth every 12 (twelve) hours as needed. 60 tablet 2   No current facility-administered medications on file prior to visit.     ALLERGIES: No Known Allergies  FAMILY HISTORY: Family History  Problem Relation Age of Onset  . Diabetes Mother   . Hypertension Mother   . Diabetes Maternal Grandmother     SOCIAL HISTORY: Social History   Social History  . Marital status: Single    Spouse name: N/A  . Number of children: N/A  . Years of education: N/A   Occupational History  . Not on file.   Social History Main Topics  . Smoking status: Current Every Day Smoker    Packs/day: 1.00    Types: Cigarettes  . Smokeless tobacco: Never Used     Comment: trying to quit  . Alcohol use 0.0 oz/week     Comment: rare but on occasion  . Drug use: No  . Sexual activity: Yes    Birth  control/ protection: Surgical   Other Topics Concern  . Not on file   Social History Narrative   Pt lives with her two children. Has some college.     REVIEW OF SYSTEMS: Constitutional: No fevers, chills, or sweats, no generalized fatigue, change in appetite Eyes: No visual changes, double vision, eye pain Ear, nose and throat: No hearing loss, ear pain, nasal congestion, sore throat Cardiovascular: No chest pain, palpitations Respiratory:  No shortness of breath at rest or with exertion, wheezes GastrointestinaI: No nausea, vomiting, diarrhea, abdominal pain, fecal incontinence Genitourinary:  No dysuria, urinary retention or frequency Musculoskeletal:  No neck pain, back pain Integumentary: No rash, pruritus, skin lesions Neurological: as above Psychiatric: No depression, insomnia, anxiety Endocrine: No palpitations, fatigue, diaphoresis, mood swings, change in appetite, change in weight, increased thirst  Hematologic/Lymphatic:  No purpura, petechiae. Allergic/Immunologic: no itchy/runny eyes, nasal congestion, recent allergic reactions, rashes  PHYSICAL EXAM: Vitals:   11/09/16 1440  BP: 100/70  Pulse: 87  SpO2: 94%   General: No acute distress.  Depressed mood.  Easily tearful. Head:  Normocephalic/atraumatic Eyes:  Fundi examined but not visualized Neck: supple, no paraspinal tenderness, full range of motion Heart:  Regular rate and rhythm Lungs:  Clear to auscultation bilaterally Back: No paraspinal tenderness Neurological Exam: alert and oriented to person, place, and time. Attention span and concentration intact, recent and remote memory intact, fund of knowledge intact.  Speech fluent and not dysarthric, language intact.  CN II-XII intact. Bulk and tone normal. Unable to move her right lower extremity against gravity.  Otherwise, 5/5.  Sensation to light touch, temperature and vibration reduced in the right lower extremity, otherwise intact.  Deep tendon reflexes 2+  throughout, toes downgoing.  Finger to nose and heel to shin testing intact. Difficulty standing from seated position and needs to use arms to push up.  Unable to ambulate.  IMPRESSION: Right lower extremity weakness and numbness.  NCV-EMG normal, indicating peripheral nervous system etiology unlikely (given the degree of her weakness).  Prior MRI of brain and thoracic spine negative for a specific cause.  Imaging of the cervical spine is the only thing left to check.  However, she does not exhibit any upper motor neuron symptoms to suggest a CNS etiology.  For sake of completeness, we will check MRI of cervical spine.  If unremarkable, I would believe that her symptoms are psychogenic and due to a conversion disorder.  PLAN: MRI of cervical spine. If unremarkable, psychiatric treatment strongly recommended.  25 minutes spent face to face with patient, over 50% spent discussing testing and possible differential diagnoses.Metta Clines, DO  CC: Andrena Mews, MD  ADDENDUM (11/25/16):  MRI of cervical spine personally reviewed and unremarkable.  I have no neurologic explanation for her symptoms. I think her symptoms are likely non-organic.  Metta Clines, DO

## 2016-11-09 NOTE — Patient Instructions (Signed)
1.  We will check MRI of cervical spine without contrast to look for cause of right leg numbness and weakness

## 2016-11-10 ENCOUNTER — Other Ambulatory Visit: Payer: Self-pay | Admitting: Family Medicine

## 2016-11-10 MED ORDER — INSULIN ASPART 100 UNIT/ML FLEXPEN: PEN_INJECTOR | SUBCUTANEOUS | 2 refills | Status: DC

## 2016-11-25 ENCOUNTER — Ambulatory Visit
Admission: RE | Admit: 2016-11-25 | Discharge: 2016-11-25 | Disposition: A | Discharge status: Home / Self Care | Payer: Medicaid Other | Source: Ambulatory Visit | Attending: Neurology | Admitting: Neurology

## 2016-11-25 DIAGNOSIS — R200 Anesthesia of skin: Secondary | ICD-10-CM

## 2016-11-25 DIAGNOSIS — R29898 Other symptoms and signs involving the musculoskeletal system: Secondary | ICD-10-CM

## 2016-11-25 DIAGNOSIS — R202 Paresthesia of skin: Secondary | ICD-10-CM

## 2016-11-26 ENCOUNTER — Telehealth: Payer: Self-pay

## 2016-11-26 NOTE — Telephone Encounter (Signed)
-----   Message from Pieter Partridge, DO sent at 11/25/2016 12:30 PM EST ----- MRI of cervical spine is unremarkable.  I have no neurologic explanation for her right foot numbness and weakness.  She should follow up with her PCP

## 2016-11-26 NOTE — Telephone Encounter (Signed)
Called and spoke with Pt, advsd her of MRI results. She will contact PCP.

## 2016-12-21 ENCOUNTER — Other Ambulatory Visit: Payer: Self-pay | Admitting: Family Medicine

## 2016-12-21 MED ORDER — DICLOFENAC SODIUM 1 % TD GEL: 4.0000 g | Freq: Four times a day (QID) | TRANSDERMAL | 3 refills | Status: DC | PRN

## 2017-02-02 ENCOUNTER — Other Ambulatory Visit: Payer: Self-pay | Admitting: Family Medicine

## 2017-02-05 ENCOUNTER — Telehealth: Payer: Self-pay | Admitting: *Deleted

## 2017-02-05 NOTE — Telephone Encounter (Signed)
I spoke with the patient, she is still needing to use her tramadol. She went to the pharmacy and was told she needed to pay the full. Price. Please help obtain prior authorization form on her behalf. Thanks.

## 2017-02-05 NOTE — Telephone Encounter (Signed)
Received fax from [facility] requesting prior authorization of Tramadol .  Rx was written on 08/14/2016 at patient's last OV. Does PCP want patient to have this med at this time. Please advise. Velora Heckler, RN

## 2017-02-05 NOTE — Telephone Encounter (Signed)
PA form placed in MDs box, please complete and return to RN clinic.  Will transfer info to [employer]. Kenly Henckel, [name], CMA

## 2017-02-09 NOTE — Telephone Encounter (Signed)
Form completed and placed in RN's inbox.

## 2017-02-09 NOTE — Telephone Encounter (Signed)
Prior authorization form complete.   I reviewed her record and was unable to fine a recent opioid/chronic pain medication contract.  I called and advised patient to come to the nurse clinic for medication education, and review contract with her and then sign. She will call to schedule appointment. She can also see me instead if she prefers that.  A copy of the contract will be placed in my inbox for review as well.  Discussion plan:  Indication for chronic opioid: Chronic Lower Limb pain Medication and dose: Tramadol 50 mg q12 prn pain. (Education: she must not exceed current dose per month or her prescription will be discontinued) # pills per month: 60 Last UDS date: 11/06/14 (RN to obtain urine toxicology screening at the time of visit) Pain contract signed (Y/N): Will sign during nurse clinic visit. Date narcotic database last reviewed (include red flags): Dr. Gwendlyn Deutscher reviewed on 02/10/16    To do at the nurse visit: 1. Document last dose of pain meds. 2. Ask about meds side effect and educate:     Seizures, respiratory depression, dizziness, nausea, constipation, headache e.t.c 3. Review and discuss pain contract with patient and both parties should sign. 4. Place the signed copy in Dr. Macario Golds inbox for review and signing.  Please call Dr. Gwendlyn Deutscher if she is in the clinic at the time of patient's visit; to meet and discuss with her as well.  Thanks.

## 2017-02-22 ENCOUNTER — Other Ambulatory Visit: Payer: Self-pay | Admitting: Family Medicine

## 2017-04-29 ENCOUNTER — Other Ambulatory Visit: Payer: Self-pay | Admitting: Family Medicine

## 2017-06-28 ENCOUNTER — Encounter: Payer: Self-pay | Admitting: Family Medicine

## 2017-06-28 NOTE — Progress Notes (Signed)
LM for patient asking her to call back for a follow up appt.  Jarrel Knoke,CMA

## 2017-06-28 NOTE — Patient Instructions (Signed)
Patient need follow-up appointment for DM management.

## 2017-07-06 ENCOUNTER — Encounter: Payer: Self-pay | Admitting: Family Medicine

## 2017-07-06 ENCOUNTER — Ambulatory Visit: Payer: Medicaid Other | Admitting: Family Medicine

## 2017-07-06 ENCOUNTER — Other Ambulatory Visit: Payer: Self-pay

## 2017-07-06 VITALS — BP 132/70 | HR 98 | Temp 98.5°F | Ht 67.0 in | Wt 193.0 lb

## 2017-07-06 DIAGNOSIS — M545 Low back pain, unspecified: Secondary | ICD-10-CM | POA: Insufficient documentation

## 2017-07-06 DIAGNOSIS — R269 Unspecified abnormalities of gait and mobility: Secondary | ICD-10-CM | POA: Diagnosis not present

## 2017-07-06 DIAGNOSIS — F339 Major depressive disorder, recurrent, unspecified: Secondary | ICD-10-CM | POA: Diagnosis not present

## 2017-07-06 DIAGNOSIS — E1040 Type 1 diabetes mellitus with diabetic neuropathy, unspecified: Secondary | ICD-10-CM | POA: Diagnosis not present

## 2017-07-06 LAB — POCT GLYCOSYLATED HEMOGLOBIN (HGB A1C): HbA1c, POC (controlled diabetic range): 7.6 % — AB (ref 0.0–7.0)

## 2017-07-06 NOTE — Progress Notes (Signed)
Patient was agreeable to receiving the pneumovax 23 but we are currently out of stock.  Advised patient to remind [place] at her next visit to get this done for her.  Jazmin Hartsell,CMA

## 2017-07-06 NOTE — Assessment & Plan Note (Signed)
A1C improved a lot from her last visit now that she is on insulin pump. Continue pump per endo. F/U as needed.

## 2017-07-06 NOTE — Assessment & Plan Note (Signed)
She is doing well overall. Continue current meds and f/u with Psych and psychologist as planned.

## 2017-07-06 NOTE — Progress Notes (Signed)
Subjective:     Patient ID: Barbara Clark, female   DOB: [birth year], 42 y.o.   MRN: [id number]  HPI DM2: She is now on insulin pump and she is doing great on it. She does not have her CBG report with her today. She is happy with the result of her insulin pump. Her home CBG has improved a lot. Depression:She is compliant with her meds and she is receiving more counseling and attends group therapy. She denies any suicidal ideation; she stated once she is on her meds, she is fine. Back/Leg pain: Her low back pain and LL weakness had improved since got on the insulin pump. She does not know why it is but that is how she feels. She is still using a cane and will like to get off the cane by the end of the month. She just got a job offer which she does not want to lose. She requested PT referral to help with her full recovery. HM: Here for routine healthcare f/u.  Current Outpatient Medications on File Prior to Visit  Medication Sig Dispense Refill  . ARIPiprazole (ABILIFY) 5 MG tablet 10 mg daily  2  . benztropine (COGENTIN) 0.5 MG tablet Take 0.5 mg by mouth daily.  2  . desvenlafaxine (PRISTIQ) 50 MG 24 hr tablet Take 50 mg by mouth daily.    Marland Kitchen gabapentin (NEURONTIN) 400 MG capsule Take 400 mg by mouth 3 (three) times daily.    Marland Kitchen lisinopril (PRINIVIL,ZESTRIL) 5 MG tablet Take 5 mg by mouth daily.  3  . mirtazapine (REMERON) 15 MG tablet Take 15 mg by mouth at bedtime.  2  . TEGRETOL 200 MG tablet Take 200 mg by mouth 3 (three) times daily.  2  . traMADol (ULTRAM) 50 MG tablet Take 1 tablet (50 mg total) by mouth every 12 (twelve) hours as needed. 60 tablet 2  . diclofenac sodium (VOLTAREN) 1 % GEL Apply 4 g topically 4 (four) times daily as needed (knee). 100 g 3  . glucose blood (ACCU-CHEK AVIVA) test strip Use to check capillary glucose TID prn for DM type 1 500 each 4  . insulin aspart (NOVOLOG) 100 UNIT/ML FlexPen Inject 20 unit [place] in morning, 20 unit [place] in the afternoon and 20 units [place] at  night. Each with meal. Hold if glucose is less than 100 (Patient not taking: Reported on 07/06/2017) 3 pen 2  . Insulin Pen Needle (B-D UF III MINI PEN NEEDLES) 31G X 5 MM MISC To be used with novolog flexpen and lantus solostar.  Can substitute per formulary as compatible. 100 each 5  . Lancets (ACCU-CHEK SOFT TOUCH) lancets Use to check capillary glucose TID prn for DM1 500 each 4  . OLANZapine (ZYPREXA) 7.5 MG tablet Take 7.5 mg by mouth at bedtime.    . VOLTAREN 1 % GEL Apply 4 grams topically FOUR TIMES DAILY AS NEEDED for knee pain 100/16=7 (Patient not taking: Reported on 07/06/2017) 100 g 3   No current facility-administered medications on file prior to visit.    Past Medical History:  Diagnosis Date  . Anxiety   . Depression   . Diabetes mellitus    Type I  . 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   Cardiovascular: Negative.   Gastrointestinal: Negative.   Musculoskeletal: Positive for back pain.  Neurological: Negative.   All other systems reviewed and are negative.  Objective:   Physical Exam  Constitutional: She appears well-developed. No distress.  Cardiovascular: Normal rate, regular rhythm and normal heart sounds.  No murmur heard. Pulmonary/Chest: Effort normal and breath sounds normal. No stridor. No respiratory distress. She has no wheezes.  Abdominal: Soft. Bowel sounds are normal. She exhibits no distension and no mass. There is no tenderness.  Musculoskeletal: Normal range of motion. She exhibits no edema or deformity.       Lumbar back: Normal.  Still walks with a cane  Neurological: She is alert. She displays normal reflexes. No cranial nerve deficit. She exhibits normal muscle tone. Coordination normal.  Psychiatric:  This is the best I have ever seen in. She is in a high spirit; happy and excited to start a new job. She is happy about her diabetes.  Nursing note and vitals  reviewed.      Assessment:     DM2 Depression Chronic low back pain Health maintenance    Plan:     Check problem list.  Pneumonia vaccine given today.

## 2017-07-06 NOTE — Patient Instructions (Signed)
Insulin Pumps An insulin pump is a small, computerized, battery-powered device that steadily (continuously) delivers insulin to the body. Insulin pumps may be used to treat type 1 diabetes (type 1 diabetes mellitus) or type 2 diabetes (type 2 diabetes mellitus). They may be used with rapid-acting insulinand short-acting insulin. An insulin pump has a container (cartridge or reservoir) of insulin that must be refilled regularly. The cartridge is connected to a needle that is placed into the skin in any of these areas:  Abdomen. Avoid any area that is less than 2 inches (5 cm) from the belly button (navel).  Front and outer area of the upper thighs.  Backs of the upper arms.  Upper buttocks.  The area where the needle is inserted is called the infusion site. The needle is surrounded by a small plastic tube (cannula). The needle and cannula are referred to as the infusion set. After the infusion set is inserted under the skin, the needle is removed and the cannula stays in place to deliver insulin to the body. What does an insulin pump do? Insulin pumps deliver insulin to the body in two ways:  Basal rate. This method gives small, continuous amounts of insulin 24 hours a day.  Bolus dose. This is a larger dose of insulin that you can give yourself immediately. You may need this after eating a meal or when you have high blood sugar (glucose).  What are some advantages of using an insulin pump? The advantages of using an insulin pump vary depending on the type of diabetes you have. Advantages may include:  Reduced need to give yourself multiple insulin injections. With an insulin pump, you need to insert a needle into your body every 2-3 days instead of every time that you need insulin.  More control over your diabetes and more flexibility for scheduling meals, snacks, and exercise.  Reduced risk of low blood glucose (hypoglycemia).  Better management of increases in blood glucose in the  early morning (dawn phenomenon).  More precise insulin doses, which may mean an improvement in blood glucose levels.  More predictable insulin absorption rate.  Easier delivery of basal insulin.  What are some disadvantages of using an insulin pump?  An insulin pump and its supplies might cost more than supplies for injections.  You may need to check your blood glucose level more often when you have an insulin pump than when you give yourself injections.  You have the pump attached to you wherever you go, for 24 hours a day. What are the risks?  The infusion site can get irritated or infected. To avoid this, care for your skin and periodically move your infusion site to a slightly different area.  Pump failure. This can be caused by a malfunction, such as a blockage in the tubing, the needle moving out of place, or the pump running out of insulin.  Most pumps have alarms to warn you of a malfunction. However, if the pump fails and you are unaware that it has failed, you may develop diabetic ketoacidosis. This is a life-threatening complication of diabetes that occurs due to lack of insulin.  Blockage (occlusion). This can prevent your pump from delivering insulin properly. This can be caused by a bent cannula or a kink in the tubing. What are some causes of high blood glucose when using an insulin pump? Possible causes of high blood glucose (hyperglycemia) when using an insulin pump include:  Tubing that breaks, leaks, bends, or moves out of place.    Low battery.  Infection at the infusion site. Signs of infection may include: ? Redness, swelling, or pain. ? Cloudy fluid or blood. ? Warmth. ? Pus or a bad smell.  A cartridge that is empty or incorrectly loaded into the pump.  A basal rate that needs to be adjusted.  Air bubbles in the tubing.  Illness or stress.  Changes in hormone levels, such as during the menstrual cycle.  The pump delivering the wrong amount of  insulin.  An interruption in insulin delivery.  Poor absorption at the infusion site, even if the site was recently changed.  Using ineffective insulin. Insulin can become ineffective if it is outdated or exposed to extreme heat or cold.  A change in normal activity levels.  How to care for your insulin pump   Refill the insulin cartridge as often as needed. Do not let the cartridge get completely empty.  Always keep extra pump supplies, syringes, and insulin with you.  Store your insulin according to instructions on the packaging.  If you have questions or a problem that you cannot solve, call your health care provider or the pump manufacturer's customer service line. How to manage your diabetes while using an insulin pump  Check your blood glucose at least 4 times a day, or as often as directed. ? Always check your blood glucose before changing your basal or bolus rates, and after changing your infusion set.  Check your A1c (hemoglobin A1c) level as often as directed.  Continue to manage your diabetes as directed. This includes: ? Exercising regularly. ? Eating healthy foods. ? Managing your weight.  If you give yourself a correction (supplemental) insulin dose and your blood glucose stays high, check your urine for ketones. ? If you have negative ketones, check your pump to see if it is working properly. If your pump does not seem to be working properly, change your cartridge, infusion set, and insertion site. Recheck your blood glucose in 1 hour. If your blood glucose is still high, give yourself another supplemental insulin dose with an injection using a syringe and needle. ? If you have moderate or large ketones in your urine, give yourself another supplemental insulin dose with an injection using a syringe and needle. Contact a health care provider if:  You have any of the following at your infusion site: ? Redness, swelling, or pain. ? Cloudy fluid or  blood. ? Warmth. ? Pus or a bad smell. ? A red streak on your skin that leads away from the infusion site.  You have a fever.  Your pump is not working properly.  Your blood glucose is higher or lower than the target range that was set by your health care provider. This information is not intended to replace advice given to you by your health care provider. Make sure you discuss any questions you have with your health care provider. Document Released: 04/13/2000 Document Revised: 06/19/2015 Document Reviewed: 02/08/2015 Elsevier Interactive Patient Education  2018 [employer]  

## 2017-07-06 NOTE — Assessment & Plan Note (Signed)
Low back pain, B/L leg pain with weakness has improved. She is working on improving her gait as well and she will like to get off walking cane. PT referral placed to help her with her goals.

## 2017-07-20 ENCOUNTER — Other Ambulatory Visit: Payer: Self-pay

## 2017-07-20 ENCOUNTER — Ambulatory Visit: Payer: Medicaid Other | Attending: Family Medicine | Admitting: Physical Therapy

## 2017-07-20 ENCOUNTER — Encounter: Payer: Self-pay | Admitting: Physical Therapy

## 2017-07-20 DIAGNOSIS — R262 Difficulty in walking, not elsewhere classified: Secondary | ICD-10-CM | POA: Diagnosis present

## 2017-07-20 DIAGNOSIS — M6281 Muscle weakness (generalized): Secondary | ICD-10-CM | POA: Diagnosis present

## 2017-07-20 DIAGNOSIS — R296 Repeated falls: Secondary | ICD-10-CM | POA: Diagnosis present

## 2017-07-20 DIAGNOSIS — R2689 Other abnormalities of gait and mobility: Secondary | ICD-10-CM

## 2017-07-20 NOTE — Therapy (Signed)
[place], [place], [address] Phone: [phone]   Fax:  [phone]  Physical Therapy Evaluation  Patient Details  Name: Barbara Clark MRN: [id number] Date of Birth: 05-May-1975 Referring Provider: Dr Andrena Mews    Encounter Date: 07/20/2017  PT End of Session - 07/20/17 1324    Visit Number  1    Number of Visits  4    Date for PT Re-Evaluation  08/17/17    Authorization Type  Mediciad     PT Start Time  0930    PT Stop Time  1013    PT Time Calculation (min)  43 min    Activity Tolerance  Patient tolerated treatment well    Behavior During Therapy  [facility] for tasks assessed/performed       Past Medical History:  Diagnosis Date  . Anxiety   . Depression   . Diabetes mellitus    Type I  . HSV-2 (herpes simplex virus 2) infection 2012   serology    Past Surgical History:  Procedure Laterality Date  . TUBAL LIGATION      There were no vitals filed for this visit.   Subjective Assessment - 07/20/17 0941    Subjective  About 5 years ago the patient had a medical incedent wqhere she passed out. When she woke up she was unable to walk. She went to rehab and progressed to a walker. She has progressed off a cane. She has tried to get off the cane biut her right leg swelled up.     Pertinent History  DM I;  axiety, depression     Limitations  Standing    How long can you stand comfortably?  20 min     How long can you walk comfortably?  can not walk more then a few steps without her cane before her legs swells up    Diagnostic tests  Nothing recent     Patient Stated Goals  to walk without her cane by 7/17     Currently in Pain?  No/denies         [facility] PT Assessment - 07/20/17 0001      Assessment   Medical Diagnosis  Gait abnormality     Referring Provider  Dr Andrena Mews     Onset Date/Surgical Date  -- Oneset of weakness 5 years prior     Hand Dominance  Right    Next MD Visit  3  months     Prior Therapy  Had 1 visit 2 years ago.       Precautions   Precautions  None      Restrictions   Weight Bearing Restrictions  No      Balance Screen   Has the patient fallen in the past 6 months  No    Has the patient had a decrease in activity level because of a fear of falling?   No    Is the patient reluctant to leave their home because of a fear of falling?   No      Home Environment   Additional Comments  No steps at her house       Prior Function   Level of Independence  Requires assistive device for independence    Vocation  On disability    Vocation Requirements  Has a job lined up.     Leisure  Going back to the gym, walking       Cognition  Overall Cognitive Status  Within Functional Limits for tasks assessed    Attention  Focused    Focused Attention  Appears intact    Memory  Appears intact    Awareness  Appears intact    Problem Solving  Appears intact      Observation/Other Assessments   Observations  rounded shoulders     Focus on Therapeutic Outcomes (FOTO)   Mediciad       Sensation   Additional Comments  No numbness at this time since the insulin pump       Coordination   Gross Motor Movements are Fluid and Coordinated  Yes    Fine Motor Movements are Fluid and Coordinated  Yes      Posture/Postural Control   Posture Comments  rounded shoulders       ROM / Strength   AROM / PROM / Strength  PROM;AROM;Strength      AROM   Overall AROM Comments  limited active hip flexion on the right       PROM   Overall PROM Comments  Normal PROM of the right hip       Strength   Strength Assessment Site  Hip;Knee    Right/Left Hip  Right;Left    Right Hip Flexion  4/5    Right Hip ABduction  4+/5    Right Hip ADduction  4+/5    Left Hip Flexion  5/5    Left Hip ABduction  5/5    Left Hip ADduction  5/5    Right/Left Knee  Right;Left    Right Knee Flexion  4+/5    Right Knee Extension  4+/5    Left Knee Flexion  5/5    Left Knee  Extension  5/5      Palpation   Palpation comment  No tenderness to paplation       Ambulation/Gait   Gait Comments  with cane patient leans heavily to the right. therapy suggested thepatient switch her hands but she feels unsafe. She has decreased single leg stance time on the right. Without the cane she is hesitant to put weight on either leg.       High Level Balance   High Level Balance Comments  narrow base of support 20 seconds; NBOS eyes closed min a; tandem stance Mod a bilateral more assist required with right to the back; Can not perfrom single leg stance bilateral;                 Objective measurements completed on examination: See above findings.      [facility] PT Treatment/Exercise - 07/20/17 0001      Lumbar Exercises: Standing   Other Standing Lumbar Exercises  standing march 2x10       Lumbar Exercises: Supine   Clam Limitations  2x10 red     Bridge Limitations  2x10     Straight Leg Raises Limitations  2x10              PT Education - 07/20/17 1323    Education Details  HEP, symptom mangement     Person(s) Educated  Patient    Methods  Explanation;Demonstration;Tactile cues;Verbal cues    Comprehension  Verbalized understanding;Returned demonstration;Verbal cues required;Tactile cues required       PT Short Term Goals - 07/20/17 1434      PT SHORT TERM GOAL #1   Title  Patient will increase right hip flexion strength to 4+/5    Baseline  4/5  right hip flexion     Time  3    Period  Weeks    Status  New    Target Date  08/10/17      PT SHORT TERM GOAL #2   Title  Patient will improve tandem stance to good bilateral     Baseline  unable to maintain tandem stance     Time  3    Period  Weeks    Status  New    Target Date  08/10/17      PT SHORT TERM GOAL #3   Title  Patient will ambualte 300' without cane with good balance     Baseline  Can not take more thne 5 steps without her cane without pain     Time  3    Status  New     Target Date  08/10/17                Plan - 07/20/17 1329    Clinical Impression Statement  Patient is a 42 year old female with left leg weakness and an antalgic gait. She uses a cane for primary mobility. She reports when she walks too long without her cane her right leg wells up. She presents with decreased right leg strength and an antalgic gait. The patient has decreased right single leg stance time with gait. She is alos hesitant on the left without the cane. She uses her cane on the wrong side but feels unsafe with it on the left side. She would benefit from skilled therapy to improve her balance, strength, and ability to ambualte without an assitive device. she was seen for a low complexity eval     History and Personal Factors relevant to plan of care:  DMI; peripheral neuropathy, anxiety     Clinical Presentation  Stable    Clinical Decision Making  Moderate    Rehab Potential  Good    PT Frequency  1x / week    PT Duration  3 weeks    PT Treatment/Interventions  ADLs/Self Care Home Management;Cryotherapy;Electrical Stimulation;Iontophoresis 4mg /ml Dexamethasone;Ultrasound;Traction;Functional mobility training;Therapeutic activities;Therapeutic exercise;Stair training;Neuromuscular re-education;Patient/family education;Passive range of motion;Manual techniques;Splinting;Taping    PT Next Visit Plan  begin standing balance exercises; review standing exercises; begin gait training in parrellel bars; consider SAQ, LAQ, Review HEP    PT Home Exercise Plan  bridges, SLR; clamshell red; standing march     Consulted and Agree with Plan of Care  Patient       Patient will benefit from skilled therapeutic intervention in order to improve the following deficits and impairments:  Abnormal gait, Pain, Postural dysfunction, Decreased activity tolerance, Decreased endurance, Decreased range of motion, Decreased strength, Decreased balance  Visit Diagnosis: Other abnormalities of gait and  mobility  Muscle weakness (generalized)     Problem List Patient Active Problem List   Diagnosis Date Noted  . Back pain, lumbosacral 07/06/2017  . Fatigue 01/28/2016  . Left knee pain 12/03/2015  . Right hip pain 10/29/2015  . Left shoulder pain 05/07/2015  . 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 Menorrhagia 04/27/2014  . Chronic leg pain 04/27/2014  . DM neuropathy, type I diabetes mellitus ([place]) 03/13/2014  . Right leg weakness 02/20/2014  . Limb weakness 08/04/2013  . Stuttering 02/02/2013  . Abnormality of gait 12/26/2012  . TOBACCO USER 10/27/2008  . Diabetes mellitus type I ([place]) 03/18/2006  . Major depressive disorder, recurrent episode ([place]) 03/18/2006  . Migraine headache 03/18/2006  Carney Living PT DPT  07/20/2017, 2:44 PM  [facility] [address] [place], [place], [address] Phone: [phone]   Fax:  [phone]  Name: Barbara Clark MRN: [id number] Date of Birth: Feb 13, 1975

## 2017-07-28 ENCOUNTER — Ambulatory Visit (INDEPENDENT_AMBULATORY_CARE_PROVIDER_SITE_OTHER): Payer: Medicaid Other | Admitting: Family Medicine

## 2017-07-28 ENCOUNTER — Other Ambulatory Visit: Payer: Self-pay

## 2017-07-28 VITALS — BP 102/64 | HR 96 | Temp 98.5°F | Ht 67.0 in | Wt 197.6 lb

## 2017-07-28 DIAGNOSIS — N898 Other specified noninflammatory disorders of vagina: Secondary | ICD-10-CM

## 2017-07-28 DIAGNOSIS — R300 Dysuria: Secondary | ICD-10-CM

## 2017-07-28 LAB — POCT URINALYSIS DIP (MANUAL ENTRY)
Bilirubin, UA: NEGATIVE
Blood, UA: NEGATIVE
Glucose, UA: NEGATIVE mg/dL
Ketones, POC UA: NEGATIVE mg/dL
Nitrite, UA: POSITIVE — AB
Protein Ur, POC: NEGATIVE mg/dL
Spec Grav, UA: 1.01 (ref 1.010–1.025)
Urobilinogen, UA: 0.2 E.U./dL
pH, UA: 7 (ref 5.0–8.0)

## 2017-07-28 LAB — POCT WET PREP (WET MOUNT)
Clue Cells Wet Prep Whiff POC: POSITIVE
Trichomonas Wet Prep HPF POC: ABSENT

## 2017-07-28 LAB — POCT UA - MICROSCOPIC ONLY

## 2017-07-28 MED ORDER — CEPHALEXIN 500 MG PO CAPS: 500.0000 mg | ORAL_CAPSULE | Freq: Four times a day (QID) | ORAL | 0 refills | Status: AC

## 2017-07-28 MED ORDER — METRONIDAZOLE 500 MG PO TABS: 500.0000 mg | ORAL_TABLET | Freq: Two times a day (BID) | ORAL | 0 refills | Status: AC

## 2017-07-28 NOTE — Patient Instructions (Signed)
It was a pleasure to see you today! Thank you for choosing [facility] for your primary care. Barbara Clark was seen for UTI, BV.   Our plans for today were:  Take both antibiotics and come back if things get worse or give [place] a call. I will call you if the culture changes our plan.   Best,  Dr. Lindell Noe

## 2017-07-28 NOTE — Progress Notes (Signed)
   CC: UTI, vag discharge  HPI  UTI - this doesn't seem like a yeast infection. Dysuria - burning. + vaginal discharge. No fevers. Uses condoms with sex, not concerned with STI. No soap inside her vagina, sometimes wipes. Fishy odor. Has had BV before.   Tobacco use - quit smoking 2 weeks ago.   ROS: Denies CP, SOB, abdominal pain, + dysuria, no changes in BMs.   CC, SH/smoking status, and VS noted  Objective: BP 102/64   Pulse 96   Temp 98.5 F (36.9 C) (Oral)   Ht 5\' 7"  (1.702 m)   Wt 197 lb 9.6 oz (89.6 kg)   SpO2 98%   BMI 30.95 kg/m  Gen: NAD, alert, cooperative, and pleasant. HEENT: NCAT, EOMI, PERRL CV: RRR, no murmur Resp: CTAB, no wheezes, non-labored Abd: SNTND, BS present, no guarding or organomegaly GU: normal external female genitalia, no lesions Ext: No edema, warm Neuro: Alert and oriented, Speech clear, No gross deficits  Assessment and plan:  UTI: UA consistent with UTI, no recent hx of resistance. Will treat with keflex and send for culture.   BV: wet prep with clue cells, will treat with flagyl, instructed patient not to drink while taking this.   Orders Placed This Encounter  Procedures  . Urine Culture  . POCT urinalysis dipstick  . POCT UA - Microscopic Only  . POCT Wet Prep [facility])    Meds ordered this encounter  Medications  . metroNIDAZOLE (FLAGYL) 500 MG tablet    Sig: Take 1 tablet (500 mg total) by mouth 2 (two) times daily for 7 days.    Dispense:  14 tablet    Refill:  0  . cephALEXin (KEFLEX) 500 MG capsule    Sig: Take 1 capsule (500 mg total) by mouth 4 (four) times daily for 7 days. Take for 7 days    Dispense:  28 capsule    Refill:  0   Ralene Ok, MD, PGY3 07/29/2017 8:32 AM

## 2017-07-30 LAB — URINE CULTURE

## 2017-08-02 ENCOUNTER — Ambulatory Visit: Payer: Medicaid Other | Admitting: Physical Therapy

## 2017-08-06 ENCOUNTER — Encounter

## 2017-08-09 ENCOUNTER — Ambulatory Visit: Payer: Medicaid Other | Admitting: Physical Therapy

## 2017-08-09 ENCOUNTER — Encounter: Payer: Self-pay | Admitting: Physical Therapy

## 2017-08-09 DIAGNOSIS — R262 Difficulty in walking, not elsewhere classified: Secondary | ICD-10-CM

## 2017-08-09 DIAGNOSIS — R2689 Other abnormalities of gait and mobility: Secondary | ICD-10-CM

## 2017-08-09 DIAGNOSIS — M6281 Muscle weakness (generalized): Secondary | ICD-10-CM

## 2017-08-09 DIAGNOSIS — R296 Repeated falls: Secondary | ICD-10-CM

## 2017-08-09 NOTE — Therapy (Signed)
[place], [place], [address] Phone: [phone]   Fax:  [phone]  Physical Therapy Treatment  Patient Details  Name: Barbara Clark MRN: [id number] Date of Birth: Dec 07, 1975 Referring Provider: Dr Andrena Mews    Encounter Date: 08/09/2017  PT End of Session - 08/09/17 0810    Visit Number  2    Number of Visits  4    Date for PT Re-Evaluation  08/17/17    Authorization Type  Mediciad     PT Start Time  0801    PT Stop Time  0842    PT Time Calculation (min)  41 min    Activity Tolerance  Patient tolerated treatment well    Behavior During Therapy  [facility] for tasks assessed/performed       Past Medical History:  Diagnosis Date  . Anxiety   . Depression   . Diabetes mellitus    Type I  . HSV-2 (herpes simplex virus 2) infection 2012   serology    Past Surgical History:  Procedure Laterality Date  . TUBAL LIGATION      There were no vitals filed for this visit.  Subjective Assessment - 08/09/17 0806    Subjective  Patient has been doing a lott of walking. When she walks to far her feet and hands are sweling up. She is walking as much as 10 miles a day. Therapy advised her to maybe scale it back until her legs get a little stronger.     Pertinent History  DM I;  axiety, depression     How long can you stand comfortably?  20 min     How long can you walk comfortably?  can not walk more then a few steps without her cane before her legs swells up    Diagnostic tests  Nothing recent     Patient Stated Goals  to walk without her cane by 7/17     Currently in Pain?  No/denies a little sharp pain every now and again          [facility] PT Assessment - 08/09/17 0001      Ambulation/Gait   Gait Comments  walking in parellel bars without support. 8 todtal laps. Cuing for base of support. Patient is hesitsant to put weight straight through right leg but improved technique with cuing.       High Level  Balance   High Level Balance Comments  Narrow base of support 3x30 sec hold min/mod assist narrow base of support eyes closed mi/ mod a tandem stance 2x30 sec each hold 3x20 sec hold;                   [facility] Adult PT Treatment/Exercise - 08/09/17 0001      Lumbar Exercises: Standing   Heel Raises  20 reps    Other Standing Lumbar Exercises  standing hip extension 2x10; abduction 2x10 each leg     Other Standing Lumbar Exercises  standing march 2x10              PT Education - 08/09/17 0913    Education Details  updated HEP, training balance    Methods  Explanation;Demonstration;Tactile cues;Verbal cues    Comprehension  Verbalized understanding;Returned demonstration;Verbal cues required;Tactile cues required;Need further instruction       PT Short Term Goals - 07/20/17 1434      PT SHORT TERM GOAL #1   Title  Patient will increase right hip  flexion strength to 4+/5    Baseline  4/5 right hip flexion     Time  3    Period  Weeks    Status  New    Target Date  08/10/17      PT SHORT TERM GOAL #2   Title  Patient will improve tandem stance to good bilateral     Baseline  unable to maintain tandem stance     Time  3    Period  Weeks    Status  New    Target Date  08/10/17      PT SHORT TERM GOAL #3   Title  Patient will ambualte 300' without cane with good balance     Baseline  Can not take more thne 5 steps without her cane without pain     Time  3    Status  New    Target Date  08/10/17               Plan - 08/09/17 0817    Clinical Impression Statement  Thersapy worked on gait training with the patient. Without the cane she uses a wide base of support. She was advissed to narrow her base of support. When she does she has difficulty bringing her weight straight acrossed her foot. It is probably part of the reason she is having lateral hip pain. She needs to shift her weight laterally with gait becuase of her wide base of support . She required  UE and therapist assit for balance exercises. She was shown how to work on them safely at home for her HEP. She was also given hip strengthening exercises in standing. She reported minor pain in her righ thip while doing the left side. She was advised to just od the right now.      Clinical Presentation  Stable    Rehab Potential  Good    PT Frequency  1x / week    PT Duration  3 weeks    PT Treatment/Interventions  ADLs/Self Care Home Management;Cryotherapy;Electrical Stimulation;Iontophoresis 4mg /ml Dexamethasone;Ultrasound;Traction;Functional mobility training;Therapeutic activities;Therapeutic exercise;Stair training;Neuromuscular re-education;Patient/family education;Passive range of motion;Manual techniques;Splinting;Taping    PT Next Visit Plan  continue with balance exercises    PT Home Exercise Plan  bridges, SLR; clamshell red; standing march     Consulted and Agree with Plan of Care  Patient       Patient will benefit from skilled therapeutic intervention in order to improve the following deficits and impairments:  Abnormal gait, Pain, Postural dysfunction, Decreased activity tolerance, Decreased endurance, Decreased range of motion, Decreased strength, Decreased balance  Visit Diagnosis: Other abnormalities of gait and mobility  Muscle weakness (generalized)  Difficulty in walking, not elsewhere classified  Repeated falls     Problem List Patient Active Problem List   Diagnosis Date Noted  . Back pain, lumbosacral 07/06/2017  . Fatigue 01/28/2016  . Left knee pain 12/03/2015  . Right hip pain 10/29/2015  . Left shoulder pain 05/07/2015  . 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 Menorrhagia 04/27/2014  . Chronic leg pain 04/27/2014  . DM neuropathy, type I diabetes mellitus ([place]) 03/13/2014  . Right leg weakness 02/20/2014  . Limb weakness 08/04/2013  . Stuttering 02/02/2013  . Abnormality of gait 12/26/2012  . TOBACCO USER 10/27/2008  .  Diabetes mellitus type I ([place]) 03/18/2006  . Major depressive disorder, recurrent episode ([place]) 03/18/2006  . Migraine headache 03/18/2006    Carney Living PT DPT  08/09/2017, 12:21  PM  [place], [place], [address] Phone: [phone]   Fax:  [phone]  Name: Barbara Clark MRN: [id number] Date of Birth: [birth year]

## 2017-08-13 ENCOUNTER — Telehealth: Payer: Self-pay | Admitting: Family Medicine

## 2017-08-13 NOTE — Telephone Encounter (Signed)
Pt would like Dr. Gwendlyn Deutscher to call her. She has some questions concerning her hands and feet swelling after she walks daily. She said she walks 5 miles a day now and doesn't know if it is due to maybe walking to much or if this is something she needs to be seen for.

## 2017-08-13 NOTE — Telephone Encounter (Signed)
I called and spoke with the patient. She endorsed B/L hand and feet mild swelling following exercise. Denies associated pain or redness. Swelling resolves few min after rest. Denies any other associated symptoms.  Discussion: This is not uncommon with exercise due to vascular dilation of the muscles of the hand and feet. She also stated that she drinks a lot of water while exercising; this could lead to hyponatremia which will also contribute to hand swelling. She, however denies, headache,confusion, N/V or dizziness after exercise. Avoid excessive water intake during exercise. Massage hands and feet after exercise. F/U soon if no improvement.

## 2017-08-16 ENCOUNTER — Ambulatory Visit: Payer: Medicaid Other | Admitting: Physical Therapy

## 2017-08-16 DIAGNOSIS — R2689 Other abnormalities of gait and mobility: Secondary | ICD-10-CM

## 2017-08-16 DIAGNOSIS — M6281 Muscle weakness (generalized): Secondary | ICD-10-CM

## 2017-08-16 DIAGNOSIS — R296 Repeated falls: Secondary | ICD-10-CM

## 2017-08-16 DIAGNOSIS — R262 Difficulty in walking, not elsewhere classified: Secondary | ICD-10-CM

## 2017-08-16 NOTE — Therapy (Signed)
[place], [place], [address] Phone: [phone]   Fax:  [phone]  Physical Therapy Treatment  Patient Details  Name: Barbara Clark MRN: [id number] Date of Birth: 1975/11/03 Referring Provider: Dr Andrena Mews    Encounter Date: 08/16/2017  PT End of Session - 08/16/17 0806    Visit Number  3    Number of Visits  4    Date for PT Re-Evaluation  08/17/17    Authorization Type  [employer]     PT Start Time  0801    PT Stop Time  0845    PT Time Calculation (min)  44 min    Activity Tolerance  Patient tolerated treatment well    Behavior During Therapy  [facility] for tasks assessed/performed       Past Medical History:  Diagnosis Date  . Anxiety   . Depression   . Diabetes mellitus    Type I  . HSV-2 (herpes simplex virus 2) infection 2012   serology    Past Surgical History:  Procedure Laterality Date  . TUBAL LIGATION      There were no vitals filed for this visit.  Subjective Assessment - 08/16/17 0807    Subjective  Patient has cut down her walking in the evening. Her leg pain has improved. She is also trying to swim and do different types of exercising.     Pertinent History  DM I;  axiety, depression     Limitations  Standing    How long can you stand comfortably?  20 min     How long can you walk comfortably?  can not walk more then a few steps without her cane before her legs swells up    Diagnostic tests  Nothing recent     Patient Stated Goals  to walk without her cane by 7/17     Currently in Pain?  No/denies         [facility] PT Assessment - 08/16/17 0001      Strength   Right Hip Flexion  4+/5    Right Hip ABduction  5/5    Right Hip ADduction  5/5    Right Knee Flexion  5/5    Right Knee Extension  4+/5                   [facility] Adult PT Treatment/Exercise - 08/16/17 0001      Ambulation/Gait   Gait Comments  walking in II bars 6 laps. Cuing  keep a nomal pace.  When she slows down she looses her balance backwards.       Lumbar Exercises: Supine   Clam Limitations  2x10 green with bridge    Bent Knee Raise  Limitations    Bent Knee Raise Limitations  2x10 with bridge     Bridge Limitations  2x10     Straight Leg Raises Limitations  2x10 2lb              PT Education - 08/16/17 0808    Education Details  reveiwed activity managment    Person(s) Educated  Patient    Methods  Explanation;Demonstration;Tactile cues;Verbal cues    Comprehension  Verbalized understanding;Returned demonstration;Verbal cues required;Tactile cues required;Need further instruction       PT Short Term Goals - 08/16/17 0917      PT SHORT TERM GOAL #1   Title  Patient will increase right hip flexion strength to 4+/5    Baseline  4+/5    Time  3    Period  Weeks    Status  Achieved      PT SHORT TERM GOAL #2   Title  Patient will improve tandem stance to good bilateral     Baseline  continues to lose her balcne posteriorly     Time  3    Period  Weeks    Status  On-going      PT SHORT TERM GOAL #3   Title  Patient will ambualte 300' without cane with good balance     Baseline  continues to loose her balance posteriorly     Time  3    Period  Weeks    Status  On-going        PT Long Term Goals - 08/16/17 5956      PT LONG TERM GOAL #1   Title  Patient will be indepdnent with HEP that promotes blance, LE strength, and a proper gait pattern.     Time  4    Period  Weeks    Status  New            Plan - 08/16/17 0809    Clinical Impression Statement  Patient is making progress. her balance is improving without her cane but she still loses her balance backwards. In just 2 visits her hip strength has improved from 4/5 to 4+/5. Her gait has improved but she still requires cuing for pace and for weight shift. She is still shifting more lateral. She also feels like her leg sweels when she walks without her cane. She would benefit from durther  skilled therapy to continue to work on her gait pattern and progressing her strength and stability.     Clinical Presentation  Stable    Clinical Decision Making  Moderate    Rehab Potential  Good    PT Frequency  1x / week    PT Duration  4 weeks    PT Treatment/Interventions  ADLs/Self Care Home Management;Cryotherapy;Electrical Stimulation;Iontophoresis 4mg /ml Dexamethasone;Ultrasound;Traction;Functional mobility training;Therapeutic activities;Therapeutic exercise;Stair training;Neuromuscular re-education;Patient/family education;Passive range of motion;Manual techniques;Splinting;Taping    PT Next Visit Plan  continue with balance exercises    PT Home Exercise Plan  bridges, SLR; clamshell red; standing march     Consulted and Agree with Plan of Care  Patient       Patient will benefit from skilled therapeutic intervention in order to improve the following deficits and impairments:  Abnormal gait, Pain, Postural dysfunction, Decreased activity tolerance, Decreased endurance, Decreased range of motion, Decreased strength, Decreased balance  Visit Diagnosis: Other abnormalities of gait and mobility  Muscle weakness (generalized)  Difficulty in walking, not elsewhere classified  Repeated falls     Problem List Patient Active Problem List   Diagnosis Date Noted  . Back pain, lumbosacral 07/06/2017  . Fatigue 01/28/2016  . Left knee pain 12/03/2015  . Right hip pain 10/29/2015  . Left shoulder pain 05/07/2015  . 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 Menorrhagia 04/27/2014  . Chronic leg pain 04/27/2014  . DM neuropathy, type I diabetes mellitus ([place]) 03/13/2014  . Right leg weakness 02/20/2014  . Limb weakness 08/04/2013  . Stuttering 02/02/2013  . Abnormality of gait 12/26/2012  . TOBACCO USER 10/27/2008  . Diabetes mellitus type I ([place]) 03/18/2006  . Major depressive disorder, recurrent episode ([place]) 03/18/2006  . Migraine headache 03/18/2006     Carney Living PT DPT  08/16/2017, 9:48 AM  [place]  [facility] [address] [place], [place], [address] Phone: [phone]   Fax:  [phone]  Name: Aven Cegielski MRN: [id number] Date of Birth: 1975/01/25

## 2017-08-31 ENCOUNTER — Encounter: Payer: Self-pay | Admitting: Physical Therapy

## 2017-08-31 ENCOUNTER — Ambulatory Visit: Payer: Medicaid Other | Attending: Family Medicine | Admitting: Physical Therapy

## 2017-08-31 DIAGNOSIS — M6281 Muscle weakness (generalized): Secondary | ICD-10-CM | POA: Diagnosis present

## 2017-08-31 DIAGNOSIS — R262 Difficulty in walking, not elsewhere classified: Secondary | ICD-10-CM | POA: Insufficient documentation

## 2017-08-31 DIAGNOSIS — R2689 Other abnormalities of gait and mobility: Secondary | ICD-10-CM | POA: Diagnosis not present

## 2017-08-31 DIAGNOSIS — R296 Repeated falls: Secondary | ICD-10-CM | POA: Diagnosis present

## 2017-09-01 NOTE — Therapy (Signed)
[place], [place], [address] Phone: [phone]   Fax:  [phone]  Physical Therapy Treatment  Patient Details  Name: Barbara Clark MRN: [id number] Date of Birth: August 27, 1975 Referring Provider: Dr Andrena Mews    Encounter Date: 08/31/2017    Past Medical History:  Diagnosis Date  . Anxiety   . Depression   . Diabetes mellitus    Type I  . HSV-2 (herpes simplex virus 2) infection 2012   serology    Past Surgical History:  Procedure Laterality Date  . TUBAL LIGATION      There were no vitals filed for this visit.      [facility] PT Assessment - 09/01/17 0001      Strength   Right Hip Flexion  5/5    Right Hip ABduction  5/5    Right Hip ADduction  5/5    Right Knee Flexion  5/5    Right Knee Extension  5/5      Ambulation/Gait   Gait Comments  Perfomed gait in the parellell bars. Able to perfrom high stepping without loss of balance.       High Level Balance   High Level Balance Comments  tandem stance on air-ex 3x20 sec each leg; NBOS eyes open and eyes closed 2x20 sec each; sep onto air-ex 20x each leg                    [facility] Adult PT Treatment/Exercise - 09/01/17 0001      High Level Balance   High Level Balance Comments  NBOS eo and ec 3x20 sec hold; NBOS on air-ex 3x20 sec hold minor loss of balance posterior; side step onto air-ex       Lumbar Exercises: Supine   Clam Limitations  2x10 green with bridge    Bent Knee Raise  Limitations    Bridge Limitations  2x10     Straight Leg Raises Limitations  2x10 2lb       Knee/Hip Exercises: Supine   Short Arc Quad Sets Limitations  2x15 1 lb                PT Short Term Goals - 08/31/17 0813      PT SHORT TERM GOAL #1   Title  Patient will increase right hip flexion strength to 4+/5    Baseline  5/5     Time  3    Period  Weeks    Status  Achieved      PT SHORT TERM GOAL #2   Title  Patient will  improve tandem stance to good bilateral     Baseline  continues to require assist. Improved balance to fair     Time  3    Period  Weeks    Status  On-going      PT SHORT TERM GOAL #3   Title  Patient will ambualte 300' without cane with good balance     Baseline  continues to have some loss of balance with cane     Time  3    Period  Weeks    Status  On-going        PT Long Term Goals - 09/01/17 1640      PT LONG TERM GOAL #1   Title  Patient will be indepdnent with HEP that promotes blance, LE strength, and a proper gait pattern.     Baseline  continues to work on [employer]  Period  Weeks    Status  New    Target Date  09/29/17      PT LONG TERM GOAL #2   Title  Patient will ambualte 3000' with LRAD with no LOB     Baseline  has posterior loss of balance at times wihtout her cane     Time  4    Period  Weeks    Target Date  09/29/17            Plan - 08/31/17 0911    Clinical Impression Statement  Patient contines to make good progress. Her balance has improved. Her staticx balance is good. her dynamic balance is still limited although mproved. She had no LOB walking today. Her right hip strength has improved to 4+/5 gross. She has started practicing using the cane less. The patient would benefit from skilled therapy continue working on gait training and balance training.     Rehab Potential  Good    PT Frequency  1x / week    PT Duration  4 weeks    PT Treatment/Interventions  ADLs/Self Care Home Management;Cryotherapy;Electrical Stimulation;Iontophoresis 4mg /ml Dexamethasone;Ultrasound;Traction;Functional mobility training;Therapeutic activities;Therapeutic exercise;Stair training;Neuromuscular re-education;Patient/family education;Passive range of motion;Manual techniques;Splinting;Taping    PT Next Visit Plan  continue with balance exercises    PT Home Exercise Plan  bridges, SLR; clamshell red; standing march        Patient will benefit from skilled therapeutic  intervention in order to improve the following deficits and impairments:  Abnormal gait, Pain, Postural dysfunction, Decreased activity tolerance, Decreased endurance, Decreased range of motion, Decreased strength, Decreased balance  Visit Diagnosis: Other abnormalities of gait and mobility  Muscle weakness (generalized)  Difficulty in walking, not elsewhere classified  Repeated falls     Problem List Patient Active Problem List   Diagnosis Date Noted  . Back pain, lumbosacral 07/06/2017  . Fatigue 01/28/2016  . Left knee pain 12/03/2015  . Right hip pain 10/29/2015  . Left shoulder pain 05/07/2015  . 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 Menorrhagia 04/27/2014  . Chronic leg pain 04/27/2014  . DM neuropathy, type I diabetes mellitus ([place]) 03/13/2014  . Right leg weakness 02/20/2014  . Limb weakness 08/04/2013  . Stuttering 02/02/2013  . Abnormality of gait 12/26/2012  . TOBACCO USER 10/27/2008  . Diabetes mellitus type I ([place]) 03/18/2006  . Major depressive disorder, recurrent episode ([place]) 03/18/2006  . Migraine headache 03/18/2006    Carney Living PT DPT  09/01/2017, 4:47 PM  [facility] [address] [place], [place], [address] Phone: [phone]   Fax:  [phone]  Name: Barbara Clark MRN: [id number] Date of Birth: 15-Jan-1976

## 2017-09-03 ENCOUNTER — Encounter (HOSPITAL_COMMUNITY): Payer: Self-pay

## 2017-09-03 ENCOUNTER — Inpatient Hospital Stay (HOSPITAL_COMMUNITY)
Admission: EM | Admit: 2017-09-03 | Discharge: 2017-09-04 | DRG: 639 | Disposition: A | Discharge status: Home / Self Care | Payer: Medicaid Other | Attending: Family Medicine | Admitting: Family Medicine

## 2017-09-03 DIAGNOSIS — Z7982 Long term (current) use of aspirin: Secondary | ICD-10-CM | POA: Diagnosis not present

## 2017-09-03 DIAGNOSIS — G43909 Migraine, unspecified, not intractable, without status migrainosus: Secondary | ICD-10-CM | POA: Diagnosis present

## 2017-09-03 DIAGNOSIS — Z794 Long term (current) use of insulin: Secondary | ICD-10-CM

## 2017-09-03 DIAGNOSIS — F319 Bipolar disorder, unspecified: Secondary | ICD-10-CM | POA: Diagnosis present

## 2017-09-03 DIAGNOSIS — Z8249 Family history of ischemic heart disease and other diseases of the circulatory system: Secondary | ICD-10-CM

## 2017-09-03 DIAGNOSIS — Z9641 Presence of insulin pump (external) (internal): Secondary | ICD-10-CM | POA: Diagnosis present

## 2017-09-03 DIAGNOSIS — Z9851 Tubal ligation status: Secondary | ICD-10-CM | POA: Diagnosis not present

## 2017-09-03 DIAGNOSIS — E1310 Other specified diabetes mellitus with ketoacidosis without coma: Secondary | ICD-10-CM | POA: Diagnosis not present

## 2017-09-03 DIAGNOSIS — I10 Essential (primary) hypertension: Secondary | ICD-10-CM | POA: Diagnosis present

## 2017-09-03 DIAGNOSIS — E1110 Type 2 diabetes mellitus with ketoacidosis without coma: Secondary | ICD-10-CM | POA: Diagnosis present

## 2017-09-03 DIAGNOSIS — E1021 Type 1 diabetes mellitus with diabetic nephropathy: Secondary | ICD-10-CM | POA: Diagnosis not present

## 2017-09-03 DIAGNOSIS — Z87891 Personal history of nicotine dependence: Secondary | ICD-10-CM

## 2017-09-03 DIAGNOSIS — Z833 Family history of diabetes mellitus: Secondary | ICD-10-CM | POA: Diagnosis not present

## 2017-09-03 DIAGNOSIS — E1040 Type 1 diabetes mellitus with diabetic neuropathy, unspecified: Secondary | ICD-10-CM | POA: Diagnosis present

## 2017-09-03 DIAGNOSIS — F3130 Bipolar disorder, current episode depressed, mild or moderate severity, unspecified: Secondary | ICD-10-CM | POA: Diagnosis not present

## 2017-09-03 DIAGNOSIS — M545 Low back pain: Secondary | ICD-10-CM | POA: Diagnosis present

## 2017-09-03 DIAGNOSIS — F419 Anxiety disorder, unspecified: Secondary | ICD-10-CM | POA: Diagnosis present

## 2017-09-03 DIAGNOSIS — E1010 Type 1 diabetes mellitus with ketoacidosis without coma: Principal | ICD-10-CM | POA: Diagnosis present

## 2017-09-03 DIAGNOSIS — Z79899 Other long term (current) drug therapy: Secondary | ICD-10-CM | POA: Diagnosis not present

## 2017-09-03 HISTORY — DX: Type 2 diabetes mellitus with ketoacidosis without coma: E11.10

## 2017-09-03 LAB — URINALYSIS, ROUTINE W REFLEX MICROSCOPIC
Bacteria, UA: NONE SEEN
Bilirubin Urine: NEGATIVE
Glucose, UA: >=500 mg/dL — AB
Ketones, ur: 80 mg/dL — AB
Leukocytes, UA: NEGATIVE
Nitrite: NEGATIVE
Protein, ur: NEGATIVE mg/dL
Specific Gravity, Urine: 1.025 (ref 1.005–1.030)
pH: 5 (ref 5.0–8.0)

## 2017-09-03 LAB — CBC
HCT: 40.5 % (ref 36.0–46.0)
Hemoglobin: 12.8 g/dL (ref 12.0–15.0)
MCH: 30.4 pg (ref 26.0–34.0)
MCHC: 31.6 g/dL (ref 30.0–36.0)
MCV: 96.2 fL (ref 78.0–100.0)
Platelets: 382 10*3/uL (ref 150–400)
RBC: 4.21 MIL/uL (ref 3.87–5.11)
RDW: 11.9 % (ref 11.5–15.5)
WBC: 10.8 10*3/uL — ABNORMAL HIGH (ref 4.0–10.5)

## 2017-09-03 LAB — BASIC METABOLIC PANEL
Anion gap: 17 — ABNORMAL HIGH (ref 5–15)
Anion gap: 10 (ref 5–15)
Anion gap: 8 (ref 5–15)
BUN: 20 mg/dL (ref 6–20)
BUN: 14 mg/dL (ref 6–20)
BUN: 12 mg/dL (ref 6–20)
CO2: 15 mmol/L — ABNORMAL LOW (ref 22–32)
CO2: 17 mmol/L — ABNORMAL LOW (ref 22–32)
CO2: 20 mmol/L — ABNORMAL LOW (ref 22–32)
Calcium: 9.4 mg/dL (ref 8.9–10.3)
Calcium: 8.8 mg/dL — ABNORMAL LOW (ref 8.9–10.3)
Calcium: 8.5 mg/dL — ABNORMAL LOW (ref 8.9–10.3)
Chloride: 101 mmol/L (ref 98–111)
Chloride: 108 mmol/L (ref 98–111)
Chloride: 108 mmol/L (ref 98–111)
Creatinine, Ser: 1.06 mg/dL — ABNORMAL HIGH (ref 0.44–1.00)
Creatinine, Ser: 0.95 mg/dL (ref 0.44–1.00)
Creatinine, Ser: 0.77 mg/dL (ref 0.44–1.00)
GFR calc Af Amer: >60 mL/min (ref >60)
GFR calc Af Amer: >60 mL/min (ref >60)
GFR calc Af Amer: >60 mL/min (ref >60)
GFR calc non Af Amer: >60 mL/min (ref >60)
GFR calc non Af Amer: >60 mL/min (ref >60)
GFR calc non Af Amer: >60 mL/min (ref >60)
Glucose, Bld: 453 mg/dL — ABNORMAL HIGH (ref 70–99)
Glucose, Bld: 226 mg/dL — ABNORMAL HIGH (ref 70–99)
Glucose, Bld: 151 mg/dL — ABNORMAL HIGH (ref 70–99)
Potassium: 5 mmol/L (ref 3.5–5.1)
Potassium: 4.6 mmol/L (ref 3.5–5.1)
Potassium: 4.2 mmol/L (ref 3.5–5.1)
Sodium: 133 mmol/L — ABNORMAL LOW (ref 135–145)
Sodium: 135 mmol/L (ref 135–145)
Sodium: 136 mmol/L (ref 135–145)

## 2017-09-03 LAB — GLUCOSE, CAPILLARY
Glucose-Capillary: 220 mg/dL — ABNORMAL HIGH (ref 70–99)
Glucose-Capillary: 202 mg/dL — ABNORMAL HIGH (ref 70–99)
Glucose-Capillary: 156 mg/dL — ABNORMAL HIGH (ref 70–99)
Glucose-Capillary: 148 mg/dL — ABNORMAL HIGH (ref 70–99)
Glucose-Capillary: 139 mg/dL — ABNORMAL HIGH (ref 70–99)

## 2017-09-03 LAB — I-STAT VENOUS BLOOD GAS, ED
Acid-base deficit: 11 mmol/L — ABNORMAL HIGH (ref 0.0–2.0)
Bicarbonate: 15.6 mmol/L — ABNORMAL LOW (ref 20.0–28.0)
O2 Saturation: 86 %
TCO2: 17 mmol/L — ABNORMAL LOW (ref 22–32)
pCO2, Ven: 37.4 mmHg — ABNORMAL LOW (ref 44.0–60.0)
pH, Ven: 7.228 — ABNORMAL LOW (ref 7.250–7.430)
pO2, Ven: 61 mmHg — ABNORMAL HIGH (ref 32.0–45.0)

## 2017-09-03 LAB — I-STAT BETA HCG BLOOD, ED (MC, WL, AP ONLY): I-stat hCG, quantitative: <5 m[IU]/mL (ref <5)

## 2017-09-03 LAB — CBG MONITORING, ED
Glucose-Capillary: 418 mg/dL — ABNORMAL HIGH (ref 70–99)
Glucose-Capillary: 425 mg/dL — ABNORMAL HIGH (ref 70–99)
Glucose-Capillary: 375 mg/dL — ABNORMAL HIGH (ref 70–99)
Glucose-Capillary: 295 mg/dL — ABNORMAL HIGH (ref 70–99)
Glucose-Capillary: 248 mg/dL — ABNORMAL HIGH (ref 70–99)

## 2017-09-03 LAB — MRSA PCR SCREENING: MRSA by PCR: NEGATIVE

## 2017-09-03 LAB — HEMOGLOBIN A1C
Hgb A1c MFr Bld: 7.3 % — ABNORMAL HIGH (ref 4.8–5.6)
Mean Plasma Glucose: 162.81 mg/dL

## 2017-09-03 MED ORDER — DIPHENHYDRAMINE HCL 25 MG PO TABS: 25.0000 mg | ORAL_TABLET | ORAL | Status: DC | PRN

## 2017-09-03 MED ORDER — ACETAMINOPHEN 650 MG RE SUPP: 650.0000 mg | Freq: Four times a day (QID) | RECTAL | Status: DC | PRN

## 2017-09-03 MED ORDER — INSULIN PUMP
SUBCUTANEOUS | Status: DC
  Filled 2017-09-03: qty 1

## 2017-09-03 MED ORDER — ONDANSETRON HCL 4 MG/2ML IJ SOLN
4.0000 mg | Freq: Once | INTRAMUSCULAR | Status: DC
  Filled 2017-09-03: qty 2

## 2017-09-03 MED ORDER — ASPIRIN EC 81 MG PO TBEC
81.0000 mg | DELAYED_RELEASE_TABLET | Freq: Every day | ORAL | Status: DC
  Administered 2017-09-03 – 2017-09-04 (×2): 81 mg via ORAL
  Filled 2017-09-03 (×2): qty 1

## 2017-09-03 MED ORDER — CARBAMAZEPINE 200 MG PO TABS
200.0000 mg | ORAL_TABLET | Freq: Three times a day (TID) | ORAL | Status: DC
  Administered 2017-09-04 (×2): 200 mg via ORAL
  Filled 2017-09-03 (×4): qty 1

## 2017-09-03 MED ORDER — ACETAMINOPHEN 325 MG PO TABS
650.0000 mg | ORAL_TABLET | Freq: Four times a day (QID) | ORAL | Status: DC | PRN
  Administered 2017-09-04: 650 mg via ORAL
  Filled 2017-09-03: qty 2

## 2017-09-03 MED ORDER — SODIUM CHLORIDE 0.9 % IV BOLUS
1000.0000 mL | Freq: Once | INTRAVENOUS | Status: AC
  Administered 2017-09-03: 1000 mL via INTRAVENOUS

## 2017-09-03 MED ORDER — ONDANSETRON HCL 4 MG/2ML IJ SOLN
4.0000 mg | Freq: Once | INTRAMUSCULAR | Status: AC
Start: 2017-09-03 — End: 2017-09-03
  Administered 2017-09-03: 4 mg via INTRAVENOUS

## 2017-09-03 MED ORDER — VENLAFAXINE HCL ER 37.5 MG PO CP24
37.5000 mg | ORAL_CAPSULE | Freq: Every day | ORAL | Status: DC
  Administered 2017-09-04: 37.5 mg via ORAL
  Filled 2017-09-03 (×2): qty 1

## 2017-09-03 MED ORDER — POTASSIUM CHLORIDE 10 MEQ/100ML IV SOLN: 10.0000 meq | INTRAVENOUS | Status: DC

## 2017-09-03 MED ORDER — DIPHENHYDRAMINE HCL 25 MG PO CAPS
25.0000 mg | ORAL_CAPSULE | Freq: Four times a day (QID) | ORAL | Status: DC | PRN
Start: 2017-09-03 — End: 2017-09-04

## 2017-09-03 MED ORDER — ENOXAPARIN SODIUM 40 MG/0.4ML ~~LOC~~ SOLN
40.0000 mg | SUBCUTANEOUS | Status: DC
  Administered 2017-09-03 – 2017-09-04 (×2): 40 mg via SUBCUTANEOUS
  Filled 2017-09-03 (×2): qty 0.4

## 2017-09-03 MED ORDER — GABAPENTIN 400 MG PO CAPS
400.0000 mg | ORAL_CAPSULE | Freq: Three times a day (TID) | ORAL | Status: DC
  Administered 2017-09-03 – 2017-09-04 (×4): 400 mg via ORAL
  Filled 2017-09-03 (×4): qty 1

## 2017-09-03 MED ORDER — POLYETHYLENE GLYCOL 3350 17 G PO PACK: 17.0000 g | PACK | Freq: Every day | ORAL | Status: DC | PRN

## 2017-09-03 MED ORDER — SODIUM CHLORIDE 0.9 % IV SOLN
INTRAVENOUS | Status: DC
  Administered 2017-09-03: 3.2 [IU]/h via INTRAVENOUS
  Administered 2017-09-04: 8.2 [IU]/h via INTRAVENOUS
  Administered 2017-09-04: 8 [IU]/h via INTRAVENOUS
  Administered 2017-09-04: 4.5 [IU]/h via INTRAVENOUS
  Filled 2017-09-03: qty 1

## 2017-09-03 MED ORDER — DEXTROSE-NACL 5-0.45 % IV SOLN
INTRAVENOUS | Status: DC
  Administered 2017-09-03 – 2017-09-04 (×3): via INTRAVENOUS

## 2017-09-03 MED ORDER — BENZTROPINE MESYLATE 1 MG PO TABS: 0.5000 mg | ORAL_TABLET | Freq: Every day | ORAL | Status: DC

## 2017-09-03 MED ORDER — SODIUM CHLORIDE 0.9 % IV SOLN
INTRAVENOUS | Status: DC
  Administered 2017-09-03 – 2017-09-04 (×3): via INTRAVENOUS

## 2017-09-03 MED ORDER — ONDANSETRON HCL 4 MG/2ML IJ SOLN: 4.0000 mg | Freq: Four times a day (QID) | INTRAMUSCULAR | Status: DC | PRN

## 2017-09-03 MED ORDER — LISINOPRIL 5 MG PO TABS
5.0000 mg | ORAL_TABLET | Freq: Every day | ORAL | Status: DC
  Administered 2017-09-03 – 2017-09-04 (×2): 5 mg via ORAL
  Filled 2017-09-03 (×2): qty 1

## 2017-09-03 MED ORDER — INSULIN ASPART 100 UNIT/ML ~~LOC~~ SOLN: 1000.0000 [IU] | Freq: Once | SUBCUTANEOUS | Status: DC

## 2017-09-03 MED ORDER — ONDANSETRON HCL 4 MG PO TABS: 4.0000 mg | ORAL_TABLET | Freq: Four times a day (QID) | ORAL | Status: DC | PRN

## 2017-09-03 MED FILL — Insulin Aspart Inj 100 Unit/ML: SUBCUTANEOUS | Qty: 10 | Status: AC

## 2017-09-03 NOTE — ED Notes (Signed)
Urine culture sent down with UA to main lab

## 2017-09-03 NOTE — Progress Notes (Addendum)
Inpatient Diabetes Program Recommendations  AACE/ADA: New Consensus Statement on Inpatient Glycemic Control (2015)  Target Ranges:  Prepandial:   less than 140 mg/dL      Peak postprandial:   less than 180 mg/dL (1-2 hours)      Critically ill patients:  140 - 180 mg/dL   Lab Results  Component Value Date   GLUCAP 248 (H) 09/03/2017   HGBA1C 7.6 (A) 07/06/2017    Diabetes history: Type 1 DM Outpatient Diabetes medications:  Medtronic insulin pump (reviewed insulin pump in the ED) Basal 1.6 units/hr Insulin to CHO ratio- 1 unit for every 4.5 grams of CHO Correction I unit drops blood sugar 22 mg/dL Goal blood sugars: 110-130 mg/dL Active insulin: 4 hours Current orders for Inpatient glycemic control:  IV insulin-DKA order set  Inpatient Diabetes Program Recommendations:     Spoke with patient.  She states that insulin pump came out during the night.  She restarted however could not get blood sugars down.  She did call Dr. Buddy Duty- endocrinologist who instructed her to come to the ED. She states that she is already feeling somewhat better.  We discussed DKA and potential causes including insulin pump coming off. I did confirm insulin pump settings.  Once acidosis is cleared, patient may resume insulin pump (overlap insulin pump by 1-2 hours with insulin drip).  She has supplies but will need a vial of Novolog insulin to refill cartidge (MD please order).  Also when insulin pump resumed, please order "insulin pump order set".  Discussed with RN.  Patient appreciative of visit and states " I have to go to [facility] tomorrow for my daughters baby shower".    Thanks,  Adah Perl, RN, BC-ADM Inpatient Diabetes Coordinator Pager [phone] (8a-5p)

## 2017-09-03 NOTE — ED Notes (Signed)
ED TO INPATIENT HANDOFF REPORT  Name/Age/Gender Barbara Clark 42 y.o. female  Code Status Code Status History    Date Active Date Inactive Code Status Order ID Comments User Context   11/06/2014 1905 11/07/2014 2100 Full Code [id number]  Veatrice Bourbon, MD Inpatient   11/20/2012 1534 11/26/2012 2121 Full Code [id number]  Kandis Nab, MD Inpatient      Home/SNF/Other Home  Chief Complaint hyperglycemia  Level of Care/Admitting Diagnosis ED Disposition    ED Disposition Condition [facility]: [place] [[id number]]  Level of Care: Stepdown [14]  Diagnosis: DKA (diabetic ketoacidosis) [facility]) [[id number]]  Admitting Physician: Lovenia Kim [[id number]]  Attending Physician: Martyn Malay [[id number]]  Estimated length of stay: past midnight tomorrow  Certification:: I certify this patient will need inpatient services for at least 2 midnights  PT Class (Do Not Modify): Inpatient [101]  PT Acc Code (Do Not Modify): Private [1]       Medical History Past Medical History:  Diagnosis Date  . Anxiety   . Depression   . Diabetes mellitus    Type I  . HSV-2 (herpes simplex virus 2) infection 2012   serology    Allergies No Known Allergies  IV Location/Drains/Wounds Patient Lines/Drains/Airways Status   Active Line/Drains/Airways    Name:   Placement date:   Placement time:   Site:   Days:   Peripheral IV 09/03/17 Right Antecubital   09/03/17    1232    Antecubital   less than 1          Labs/Imaging Results for orders placed or performed during the hospital encounter of 09/03/17 (from the past 48 hour(s))  CBG monitoring, ED     Status: Abnormal   Collection Time: 09/03/17 11:10 AM  Result Value Ref Range   Glucose-Capillary 418 (H) 70 - 99 mg/dL   Comment 1 Notify RN   Urinalysis, Routine w reflex microscopic     Status: Abnormal   Collection Time: 09/03/17 11:15 AM  Result Value Ref Range   Color, Urine STRAW (A) YELLOW   APPearance CLEAR CLEAR   Specific Gravity, Urine 1.025 1.005 - 1.030   pH 5.0 5.0 - 8.0   Glucose, UA >=500 (A) NEGATIVE mg/dL   Hgb urine dipstick SMALL (A) NEGATIVE   Bilirubin Urine NEGATIVE NEGATIVE   Ketones, ur 80 (A) NEGATIVE mg/dL   Protein, ur NEGATIVE NEGATIVE mg/dL   Nitrite NEGATIVE NEGATIVE   Leukocytes, UA NEGATIVE NEGATIVE   RBC / HPF 0-5 0 - 5 RBC/hpf   WBC, UA 0-5 0 - 5 WBC/hpf   Bacteria, UA NONE SEEN NONE SEEN   Squamous Epithelial / LPF 0-5 0 - 5   Mucus PRESENT     Comment: Performed at [facility], [address] [place], [place] [address]  Basic metabolic panel     Status: Abnormal   Collection Time: 09/03/17 11:18 AM  Result Value Ref Range   Sodium 133 (L) 135 - 145 mmol/L   Potassium 5.0 3.5 - 5.1 mmol/L   Chloride 101 98 - 111 mmol/L   CO2 15 (L) 22 - 32 mmol/L   Glucose, Bld 453 (H) 70 - 99 mg/dL   BUN 20 6 - 20 mg/dL   Creatinine, Ser 1.06 (H) 0.44 - 1.00 mg/dL   Calcium 9.4 8.9 - 10.3 mg/dL   GFR calc non Af Amer >60 >60 mL/min   GFR calc Af Amer >60 >60 mL/min  Comment: (NOTE) The eGFR has been calculated using the CKD EPI equation. This calculation has not been validated in all clinical situations. eGFR's persistently <60 mL/min signify possible Chronic Kidney Disease.    Anion gap 17 (H) 5 - 15    Comment: Performed at [facility], [place] [address], [place], [place] [address]  CBC     Status: Abnormal   Collection Time: 09/03/17 11:18 AM  Result Value Ref Range   WBC 10.8 (H) 4.0 - 10.5 K/uL   RBC 4.21 3.87 - 5.11 MIL/uL   Hemoglobin 12.8 12.0 - 15.0 g/dL   HCT 40.5 36.0 - 46.0 %   MCV 96.2 78.0 - 100.0 fL   MCH 30.4 26.0 - 34.0 pg   MCHC 31.6 30.0 - 36.0 g/dL   RDW 11.9 11.5 - 15.5 %   Platelets 382 150 - 400 K/uL    Comment: Performed at [facility], [place] [address] [place], [place] [address]  I-Stat beta hCG blood, ED     Status: None   Collection Time: 09/03/17 11:26 AM  Result Value Ref Range   I-stat  hCG, quantitative <5.0 <5 mIU/mL   Comment 3            Comment:   GEST. AGE      CONC.  (mIU/mL)   <=1 WEEK        5 - 50     2 WEEKS       50 - 500     3 WEEKS       100 - 10,000     4 WEEKS     1,000 - 30,000        FEMALE AND NON-PREGNANT FEMALE:     LESS THAN 5 mIU/mL   CBG monitoring, ED     Status: Abnormal   Collection Time: 09/03/17 11:27 AM  Result Value Ref Range   Glucose-Capillary 425 (H) 70 - 99 mg/dL  CBG monitoring, ED     Status: Abnormal   Collection Time: 09/03/17  1:22 PM  Result Value Ref Range   Glucose-Capillary 375 (H) 70 - 99 mg/dL  I-Stat Venous Blood Gas, ED     Status: Abnormal   Collection Time: 09/03/17  2:10 PM  Result Value Ref Range   pH, Ven 7.228 (L) 7.250 - 7.430   pCO2, Ven 37.4 (L) 44.0 - 60.0 mmHg   pO2, Ven 61.0 (H) 32.0 - 45.0 mmHg   Bicarbonate 15.6 (L) 20.0 - 28.0 mmol/L   TCO2 17 (L) 22 - 32 mmol/L   O2 Saturation 86.0 %   Acid-base deficit 11.0 (H) 0.0 - 2.0 mmol/L   Patient temperature HIDE    Sample type VENOUS   CBG monitoring, ED     Status: Abnormal   Collection Time: 09/03/17  2:18 PM  Result Value Ref Range   Glucose-Capillary 295 (H) 70 - 99 mg/dL   Comment 1 Notify RN   CBG monitoring, ED     Status: Abnormal   Collection Time: 09/03/17  3:30 PM  Result Value Ref Range   Glucose-Capillary 248 (H) 70 - 99 mg/dL   Comment 1 Notify RN    No results found.  Pending Labs Unresulted Labs (From admission, onward)   None      Vitals/Pain Today's Vitals   09/03/17 1145 09/03/17 1203 09/03/17 1217 09/03/17 1400  BP: 100/62 130/68  114/70  Pulse: 96  (!) 109 (!) 106  Resp: 14  (!)  21 17  SpO2: 97%  99% 98%  PainSc:        Isolation Precautions No active isolations  Medications Medications  insulin regular (NOVOLIN R,HUMULIN R) 100 Units in sodium chloride 0.9 % 100 mL (1 Units/mL) infusion (3.8 Units/hr Intravenous Rate/Dose Change 09/03/17 1537)  sodium chloride 0.9 % bolus 1,000 mL (0 mLs Intravenous Stopped  09/03/17 1336)    And  0.9 %  sodium chloride infusion ( Intravenous New Bag/Given 09/03/17 1338)  dextrose 5 %-0.45 % sodium chloride infusion ( Intravenous New Bag/Given 09/03/17 1536)  potassium chloride 10 mEq in 100 mL IVPB (10 mEq Intravenous Not Given 09/03/17 1334)  ondansetron (ZOFRAN) injection 4 mg (4 mg Intravenous Not Given 09/03/17 1237)  ondansetron (ZOFRAN) injection 4 mg (4 mg Intravenous Given 09/03/17 1233)    Mobility walks

## 2017-09-03 NOTE — H&P (Addendum)
[facility] Admission History and Physical Service Pager: [phone]  Patient name: Barbara Clark Medical record number: [id number] Date of birth: Apr 01, 1975 Age: 42 y.o. Gender: female  Primary Care Provider: [name], MD Consultants: Endocrinology Code Status: FULL   Chief Complaint: nausea, vomiting, elevated blood glucose   Assessment and Plan: Barbara Clark is a 42 y.o. female presenting with hyperglycemia. PMH is significant for T1DM with diabetic neuropathy, HTN, migraines, bipolar disorder, depression and tobacco use.    Nausea/vomiting 2/2 DKA, in the setting of T1DM: Pt reports HA, dizziness, nausea, and vomiting since this morning after insulin pump had accidentally fallen off overnight, with glucose 400-600 prior to arrival. Currently, she is no longer dizzy or nauseous, with minimal HA remaining. On exam, she is hemodynamically stable, however slightly tachycardic, alert and oriented, with dry mucous membranes. VBG showing metabolic acidosis with pH 7.22, bicarb 15. Glucose 453 on arrival. Anion gap 17. K 4.6. U/A with 80 ketones. POC A1c 7.6 in 06/2017. This is likely 2/2 to her pump falling off overnight, low suspicion for preceding infection as pt is afebrile and not endorsing any recent illness. She endorses compliance with her pump. Discussed with her endocrinologist, Dr. Buddy Duty, regarding management following discontinuation of insulin drip. He advised switching to her home insulin pump if able, and have put orders in for this to be managed by the diabetic coordinator, who is aware of this plan. Please see her note for pump settings.  -Admit to stepdown, attending Dr. Owens Shark -Two bag method: Insulin drip + NS fluids; following DKA protocol with BMP q 4  -Home insulin pump to overlap w/ insulin drip x 1-2 hrs  -Monitor glucose q 2h -Zofran IV q6 as needed for nausea  -Tylenol as needed for HA  -Obtain A1c   Diabetic neuropathy: Stable. Right  sided. She has noticed marked improvement in symptoms since starting insulin pump earlier this year. Takes gabapentin at home. On exam, she has full sensation and 5/5 strength to bilateral LE.  -Cont home gabapentin   HTN: Normotensive on admission, 119/79. Takes lisinopril 5mg  daily at home. Cr 1.06, at baseline.  -Cont home lisinopril   Bipolar disorder: Stable. Attends individual counseling and weekly group therapy, feels well controlled and not endorsing any symptoms. Taking tegetrol and cogentin at home.  -Cont home Cogentin and Tegetrol   Depression:  Stable. No current SI/HI. Uses desvenlafaxine at home. (not on formulary at hospital) -replace with venlafaxine XR 37.5 mg daily  Migraines: Stable. Occurs every 2 weeks, takes Excedrin prn with relief. Not on any home medications.   -continue to monitor  Tobacco use: Quit two months ago, taking Chantix at home.  Offered nicotine patch on admission, patient declines.    FEN/GI: NPO  Prophylaxis: Lovenox   Disposition: Admit to stepdown, attending Dr. Owens Shark.  History of Present Illness:  Barbara Clark is a 42 y.o. female with past history of insulin dependent diabetes on insulin pump, pre-hypertension, MDD, and bipolar presenting with hyperglycemia. She states she woke up this morning, and noticed her pump was on the bed next to her, unsure how long it had been off overnight. She also felt "woozy," had a headache, and was nauseous, so she checked her glucose, which was noted to be 540. She took some insulin and drank some water, but continue to feel worse. She continued to monitor her glucose through the day, but they remained elevated around 400's. She endorses taking close to 100 units of insulin.  She became nervous about taking so much, so called her endocrinologist, Dr. Buddy Duty, who advised going to the ED. Prior to today, she states she was at her normal baseline. She denies any recent fever, chills, congestion, sore throat, rhinorrhea, or  change in bowel movements. Denies any urinary symptoms or abdominal pain. She has been using an insulin pump since Jan 2019 and reports good control with this, following with endocrinology regularly.   On arrival to the ED, she was hemodynamically stable, however slightly tachycardic, but somewhat ill appearing and dry on exam. Vomited x3 while in the ED. VBG showing metabolic acidosis (pH 2.53, pCO2 37.4, bicarb 15.6). Glucose 453, Cr 1.06, anion gap 17, WBC 10.8. U/A clear with 80 ketones and negative for infection. Pregnancy negative. She was started on an insulin drip and fluids.   Currently, the patient states her headache is much improved, and no longer endorsing any dizziness or lightheadedness. She is minimally nauseous and wants to eat now.    Review Of Systems: Per HPI with the following additions:   Review of Systems  Constitutional: Negative for chills and fever.  HENT: Negative for congestion.   Respiratory: Negative for sputum production and shortness of breath.   Cardiovascular: Negative for chest pain and palpitations.  Gastrointestinal: Positive for nausea. Negative for abdominal pain, constipation, diarrhea and vomiting.  Genitourinary: Negative for dysuria and urgency.    Patient Active Problem List   Diagnosis Date Noted  . DKA (diabetic ketoacidosis) ([place]) 09/03/2017  . Back pain, lumbosacral 07/06/2017  . Fatigue 01/28/2016  . Left knee pain 12/03/2015  . Right hip pain 10/29/2015  . Left shoulder pain 05/07/2015  . 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 Menorrhagia 04/27/2014  . Chronic leg pain 04/27/2014  . DM neuropathy, type I diabetes mellitus ([place]) 03/13/2014  . Right leg weakness 02/20/2014  . Limb weakness 08/04/2013  . Stuttering 02/02/2013  . Abnormality of gait 12/26/2012  . TOBACCO USER 10/27/2008  . Diabetes mellitus type I ([place]) 03/18/2006  . Major depressive disorder, recurrent episode ([place]) 03/18/2006  . Migraine headache  03/18/2006    Past Medical History: Past Medical History:  Diagnosis Date  . Anxiety   . Depression   . Diabetes mellitus    Type I  . HSV-2 (herpes simplex virus 2) infection 2012   serology    Past Surgical History: Past Surgical History:  Procedure Laterality Date  . TUBAL LIGATION      Social History: Social History   Tobacco Use  . Smoking status: Former Smoker    Packs/day: 1.00    Types: Cigarettes    Last attempt to quit: 07/14/2017    Years since quitting: 0.1  . Smokeless tobacco: Never Used  . Tobacco comment: trying to quit  Substance Use Topics  . Alcohol use: Yes    Alcohol/week: 0.0 standard drinks    Comment: rare but on occasion  . Drug use: No   Additional social history: walking 5 miles/day in the morning. No alcohol or illicit drug use.  Please also refer to relevant sections of EMR.  Family History: Family History  Problem Relation Age of Onset  . Diabetes Mother   . Hypertension Mother   . Diabetes Maternal Grandmother     Allergies and Medications: No Known Allergies No current facility-administered medications on file prior to encounter.    Current Outpatient Medications on File Prior to Encounter  Medication Sig Dispense Refill  . aspirin EC 81 MG tablet Take  81 mg by mouth daily.    Marland Kitchen desvenlafaxine (PRISTIQ) 50 MG 24 hr tablet Take 50 mg by mouth daily.    . diphenhydrAMINE (ALLERGY) 25 MG tablet Take 25 mg by mouth as needed for itching or allergies.    Marland Kitchen gabapentin (NEURONTIN) 400 MG capsule Take 400 mg by mouth 3 (three) times daily.    Marland Kitchen lisinopril (PRINIVIL,ZESTRIL) 5 MG tablet Take 5 mg by mouth daily.  3  . TEGRETOL 200 MG tablet Take 200 mg by mouth 3 (three) times daily.  2  . traMADol (ULTRAM) 50 MG tablet Take 1 tablet (50 mg total) by mouth every 12 (twelve) hours as needed. 60 tablet 2  . benztropine (COGENTIN) 0.5 MG tablet Take 0.5 mg by mouth daily.  2  . diclofenac sodium (VOLTAREN) 1 % GEL Apply 4 g topically 4  (four) times daily as needed (knee). (Patient not taking: Reported on 09/03/2017) 100 g 3  . VOLTAREN 1 % GEL Apply 4 grams topically FOUR TIMES DAILY AS NEEDED for knee pain 100/16=7 (Patient not taking: Reported on 07/06/2017) 100 g 3   Objective: BP 119/79 (BP Location: Left Arm)   Pulse 98   Temp 98.6 F (37 C) (Oral)   Resp 15   SpO2 98%  Exam: General: Alert, NAD HEENT: NCAT, MM dry, oropharynx nonerythematous  Cardiac: RRR no m/g/r Lungs: Clear bilaterally, no increased WOB  Abdomen: soft, non-tender, non-distended, normoactive BS Msk: Moves all extremities spontaneously, sensation intact in BLE. 5/5 strength LLE, 4/5 RLE.   Neuro: Alert and oriented x3, EOMI, pupils equal and reactive. No focal neuro deficits noted.  Ext: Warm, dry, 2+ distal pulses, no edema   Labs and Imaging: CBC BMET  Recent Labs  Lab 09/03/17 1118  WBC 10.8*  HGB 12.8  HCT 40.5  PLT 382   Recent Labs  Lab 09/03/17 1118  NA 133*  K 5.0  CL 101  CO2 15*  BUN 20  CREATININE 1.06*  GLUCOSE 453*  CALCIUM 9.4      Patriciaann Clan, DO 09/03/2017, 5:44 PM PGY-1, [facility] pager: [phone], text pages welcome  I have seen and evaluated the above patient with Dr. Higinio Plan and agree with her documentation.  I have included my edits in blue.   Lovenia Kim MD [facility] PGY-3

## 2017-09-03 NOTE — ED Provider Notes (Signed)
Emergency Department Provider Note   I have reviewed the triage vital signs and the nursing notes.   HISTORY  Chief Complaint Hyperglycemia   HPI Barbara Clark is a 42 y.o. female with PMH IDDM on insulin pump to the emergency department with hyperglycemia, abdominal cramping, vomiting.  Patient states that she has had high blood sugars yesterday and change the site of her insulin pump.  Overnight she began to feel very fatigued, slightly short of breath him a very nauseated.  She gave herself manual insulin thinking that the pump may be clogged or kinked continued to have high blood sugars.  She began feeling worse throughout the morning developed several episodes of nonbloody emesis.  Denies any fevers or chills.  She states she is maintained an exercise routine and has not done anything particularly out of the ordinary.  No radiation of symptoms or other modifying factors.  Past Medical History:  Diagnosis Date  . Anxiety   . Depression   . Diabetes mellitus    Type I  . HSV-2 (herpes simplex virus 2) infection 2012   serology    Patient Active Problem List   Diagnosis Date Noted  . DKA (diabetic ketoacidosis) ([place]) 09/03/2017  . Back pain, lumbosacral 07/06/2017  . Fatigue 01/28/2016  . Left knee pain 12/03/2015  . Right hip pain 10/29/2015  . Left shoulder pain 05/07/2015  . 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 Menorrhagia 04/27/2014  . Chronic leg pain 04/27/2014  . DM neuropathy, type I diabetes mellitus ([place]) 03/13/2014  . Right leg weakness 02/20/2014  . Limb weakness 08/04/2013  . Stuttering 02/02/2013  . Abnormality of gait 12/26/2012  . TOBACCO USER 10/27/2008  . Diabetes mellitus type I ([place]) 03/18/2006  . Major depressive disorder, recurrent episode ([place]) 03/18/2006  . Migraine headache 03/18/2006    Past Surgical History:  Procedure Laterality Date  . TUBAL LIGATION     Allergies Patient has no known allergies.  Family  History  Problem Relation Age of Onset  . Diabetes Mother   . Hypertension Mother   . Diabetes Maternal Grandmother     Social History Social History   Tobacco Use  . Smoking status: Former Smoker    Packs/day: 1.00    Types: Cigarettes    Last attempt to quit: 07/14/2017    Years since quitting: 0.1  . Smokeless tobacco: Never Used  . Tobacco comment: trying to quit  Substance Use Topics  . Alcohol use: Yes    Alcohol/week: 0.0 standard drinks    Comment: rare but on occasion  . Drug use: No    Review of Systems  Constitutional: No fever/chills. Positive hyperglycemia and fatigue.  Eyes: No visual changes. ENT: No sore throat. Cardiovascular: Denies chest pain. Respiratory: Denies shortness of breath. Gastrointestinal: Positive abdominal pain. Positive nausea and vomiting.  No diarrhea.  No constipation. Genitourinary: Negative for dysuria. Musculoskeletal: Negative for back pain. Skin: Negative for rash. Neurological: Negative for headaches, focal weakness or numbness.  10-point ROS otherwise negative.  ____________________________________________   PHYSICAL EXAM:  VITAL SIGNS: ED Triage Vitals  Enc Vitals Group     BP 09/03/17 1203 130/68     Pulse Rate 09/03/17 1217 (!) 109     Resp 09/03/17 1217 (!) 21     SpO2 09/03/17 1217 99 %     Pain Score 09/03/17 1114 0   Constitutional: Alert and oriented. Well appearing and in no acute distress. Eyes: Conjunctivae are normal.  Head: Atraumatic.  Nose: No congestion/rhinnorhea. Mouth/Throat: Mucous membranes are dry.  Neck: No stridor.  Cardiovascular: Tachycardia. Good peripheral circulation. Grossly normal heart sounds.   Respiratory: Normal respiratory effort.  No retractions. Lungs CTAB. Gastrointestinal: Soft and nontender. No distention.  Musculoskeletal: No lower extremity tenderness nor edema. No gross deformities of extremities. Neurologic:  Normal speech and language. No gross focal neurologic  deficits are appreciated.  Skin:  Skin is warm, dry and intact. No rash noted. Insulin pump site well appearing.   ____________________________________________   LABS (all labs ordered are listed, but only abnormal results are displayed)  Labs Reviewed  BASIC METABOLIC PANEL - Abnormal; Notable for the following components:      Result Value   Sodium 133 (*)    CO2 15 (*)    Glucose, Bld 453 (*)    Creatinine, Ser 1.06 (*)    Anion gap 17 (*)    All other components within normal limits  CBC - Abnormal; Notable for the following components:   WBC 10.8 (*)    All other components within normal limits  URINALYSIS, ROUTINE W REFLEX MICROSCOPIC - Abnormal; Notable for the following components:   Color, Urine STRAW (*)    Glucose, UA >=500 (*)    Hgb urine dipstick SMALL (*)    Ketones, ur 80 (*)    All other components within normal limits  CBG MONITORING, ED - Abnormal; Notable for the following components:   Glucose-Capillary 425 (*)    All other components within normal limits  I-STAT VENOUS BLOOD GAS, ED - Abnormal; Notable for the following components:   pH, Ven 7.228 (*)    pCO2, Ven 37.4 (*)    pO2, Ven 61.0 (*)    Bicarbonate 15.6 (*)    TCO2 17 (*)    Acid-base deficit 11.0 (*)    All other components within normal limits  CBG MONITORING, ED - Abnormal; Notable for the following components:   Glucose-Capillary 418 (*)    All other components within normal limits  CBG MONITORING, ED - Abnormal; Notable for the following components:   Glucose-Capillary 375 (*)    All other components within normal limits  CBG MONITORING, ED - Abnormal; Notable for the following components:   Glucose-Capillary 295 (*)    All other components within normal limits  CBG MONITORING, ED - Abnormal; Notable for the following components:   Glucose-Capillary 248 (*)    All other components within normal limits  I-STAT BETA HCG BLOOD, ED ([facility], [facility], [facility] ONLY)    ____________________________________________  RADIOLOGY  None ____________________________________________   PROCEDURES  Procedure(s) performed:   .Critical Care Performed by: Margette Fast, MD Authorized by: Margette Fast, MD   Critical care provider statement:    Critical care time (minutes):  35   Critical care time was exclusive of:  Separately billable procedures and treating other patients and teaching time   Critical care was necessary to treat or prevent imminent or life-threatening deterioration of the following conditions:  Endocrine crisis   Critical care was time spent personally by me on the following activities:  Blood draw for specimens, development of treatment plan with patient or surrogate, evaluation of patient's response to treatment, examination of patient, obtaining history from patient or surrogate, ordering and performing treatments and interventions, ordering and review of laboratory studies, ordering and review of radiographic studies, pulse oximetry, re-evaluation of patient's condition and review of old charts   I assumed direction of critical care for this patient from  another provider in my specialty: no      ____________________________________________   INITIAL IMPRESSION / ASSESSMENT AND PLAN / ED COURSE  Pertinent labs & imaging results that were available during my care of the patient were reviewed by me and considered in my medical decision making (see chart for details).  Patient presents to the emergency department for evaluation of hyperglycemia.  She is somewhat ill-appearing with dry mucous membranes significant nausea.  She is had 3 episodes of vomiting in the emergency department.  Her lab work shows hyperglycemia with gap of 17 and low bicarb.  Sending for VBG but she clinically with DKA. Plan to start insulin infusion and admit.   VBG showing metabolic acidosis. Starting insulin drip and IVF. Patient remains comfortable. Treating  nausea.   Discussed patient's case with [facility] to request admission. Patient and family (if present) updated with plan. Care transferred to [facility] Medicine service.  I reviewed all nursing notes, vitals, pertinent old records, EKGs, labs, imaging (as available).  ____________________________________________  FINAL CLINICAL IMPRESSION(S) / ED DIAGNOSES  Final diagnoses:  Diabetic ketoacidosis without coma associated with type 1 diabetes mellitus ([place])    MEDICATIONS GIVEN DURING THIS VISIT:  Medications  insulin regular (NOVOLIN R,HUMULIN R) 100 Units in sodium chloride 0.9 % 100 mL (1 Units/mL) infusion (3.8 Units/hr Intravenous Rate/Dose Change 09/03/17 1537)  sodium chloride 0.9 % bolus 1,000 mL (0 mLs Intravenous Stopped 09/03/17 1336)    And  0.9 %  sodium chloride infusion ( Intravenous New Bag/Given 09/03/17 1338)  dextrose 5 %-0.45 % sodium chloride infusion ( Intravenous New Bag/Given 09/03/17 1536)  potassium chloride 10 mEq in 100 mL IVPB (10 mEq Intravenous Not Given 09/03/17 1334)  ondansetron (ZOFRAN) injection 4 mg (4 mg Intravenous Not Given 09/03/17 1237)  ondansetron (ZOFRAN) injection 4 mg (4 mg Intravenous Given 09/03/17 1233)    Note:  This document was prepared using Dragon voice recognition software and may include unintentional dictation errors.  Nanda Quinton, MD Emergency Medicine    Nelvin Tomb, Wonda [facility], MD 09/03/17 [phone]

## 2017-09-03 NOTE — ED Notes (Signed)
ED Provider at bedside. 

## 2017-09-03 NOTE — ED Notes (Signed)
CBG 295 

## 2017-09-03 NOTE — Progress Notes (Signed)
FPTS Interim Progress Note  After speaking with Dr. Owens Shark we decided as a team it would be better to leave the patient on an insulin drip overnight and start to transition at breakfast.   Called and went over the plan in detail with nursing staff to continue to drip tonight, start the pump 2 hours after she has had breakfast.  Nuala Alpha, DO 09/03/2017, 9:01 PM PGY-2, [facility] Medicine Service pager [phone]

## 2017-09-03 NOTE — ED Notes (Signed)
Pt assisted by RN to bedside commode. Pt states she is constantly dizzy and it is worse when she sits up. Lights dimmed for comfort, family bedside.

## 2017-09-03 NOTE — ED Notes (Signed)
Pt's personal insulin pump removed

## 2017-09-03 NOTE — Progress Notes (Signed)
Marijah Larranaga [id number]  Code Status: FULL  Admission Data: 09/03/2017 6:57 PM  Attending Provider: Owens Shark  EOF:[id number], Phill Myron, MD  Consults/ Treatment Team:   Barbara Clark is a 42 y.o. female patient admitted from ED awake, alert - oriented X 4 - no acute distress noted. VSS - Blood pressure 119/79, pulse 98, temperature 98.6 F (37 C), temperature source Oral, resp. rate 15, SpO2 98 %. no c/o shortness of breath, no c/o chest pain. Orientation to room, and floor completed with information packet given to patient/family. Admission INP armband ID verified with patient/family, and in place.  SR up x 2, fall assessment complete, with patient and family able to verbalize understanding of risk associated with falls, and verbalized understanding to call nsg before up out of bed. Call light within reach, patient able to voice, and demonstrate understanding. Skin, clean-dry- intact without evidence of bruising, or skin tears.  No evidence of skin break down noted on exam. ?  Will cont to eval and treat per MD orders.  Melonie [place], RN  09/03/2017 6:57 PM

## 2017-09-03 NOTE — ED Triage Notes (Signed)
Pt presents for evaluation of hyperglycemia. States was sent by PCP. BG is 418, on insulin pump. States she thinks it was kinked overnight. Pt is combative in triage, cussing at staff.

## 2017-09-03 NOTE — ED Notes (Signed)
Diabetes coordinator at bedside. 

## 2017-09-04 DIAGNOSIS — E1021 Type 1 diabetes mellitus with diabetic nephropathy: Secondary | ICD-10-CM

## 2017-09-04 DIAGNOSIS — F3130 Bipolar disorder, current episode depressed, mild or moderate severity, unspecified: Secondary | ICD-10-CM

## 2017-09-04 DIAGNOSIS — I10 Essential (primary) hypertension: Secondary | ICD-10-CM

## 2017-09-04 LAB — BASIC METABOLIC PANEL
Anion gap: 9 (ref 5–15)
Anion gap: 7 (ref 5–15)
Anion gap: 6 (ref 5–15)
Anion gap: 11 (ref 5–15)
Anion gap: 6 (ref 5–15)
BUN: 8 mg/dL (ref 6–20)
BUN: 8 mg/dL (ref 6–20)
BUN: 8 mg/dL (ref 6–20)
BUN: 10 mg/dL (ref 6–20)
BUN: 9 mg/dL (ref 6–20)
CO2: 19 mmol/L — ABNORMAL LOW (ref 22–32)
CO2: 21 mmol/L — ABNORMAL LOW (ref 22–32)
CO2: 21 mmol/L — ABNORMAL LOW (ref 22–32)
CO2: 17 mmol/L — ABNORMAL LOW (ref 22–32)
CO2: 21 mmol/L — ABNORMAL LOW (ref 22–32)
Calcium: 8.1 mg/dL — ABNORMAL LOW (ref 8.9–10.3)
Calcium: 8.1 mg/dL — ABNORMAL LOW (ref 8.9–10.3)
Calcium: 8 mg/dL — ABNORMAL LOW (ref 8.9–10.3)
Calcium: 7.9 mg/dL — ABNORMAL LOW (ref 8.9–10.3)
Calcium: 8.1 mg/dL — ABNORMAL LOW (ref 8.9–10.3)
Chloride: 109 mmol/L (ref 98–111)
Chloride: 110 mmol/L (ref 98–111)
Chloride: 110 mmol/L (ref 98–111)
Chloride: 107 mmol/L (ref 98–111)
Chloride: 109 mmol/L (ref 98–111)
Creatinine, Ser: 0.7 mg/dL (ref 0.44–1.00)
Creatinine, Ser: 0.72 mg/dL (ref 0.44–1.00)
Creatinine, Ser: 0.68 mg/dL (ref 0.44–1.00)
Creatinine, Ser: 0.82 mg/dL (ref 0.44–1.00)
Creatinine, Ser: 0.73 mg/dL (ref 0.44–1.00)
GFR calc Af Amer: >60 mL/min (ref >60)
GFR calc Af Amer: >60 mL/min (ref >60)
GFR calc Af Amer: >60 mL/min (ref >60)
GFR calc Af Amer: >60 mL/min (ref >60)
GFR calc Af Amer: >60 mL/min (ref >60)
GFR calc non Af Amer: >60 mL/min (ref >60)
GFR calc non Af Amer: >60 mL/min (ref >60)
GFR calc non Af Amer: >60 mL/min (ref >60)
GFR calc non Af Amer: >60 mL/min (ref >60)
GFR calc non Af Amer: >60 mL/min (ref >60)
Glucose, Bld: 149 mg/dL — ABNORMAL HIGH (ref 70–99)
Glucose, Bld: 191 mg/dL — ABNORMAL HIGH (ref 70–99)
Glucose, Bld: 75 mg/dL (ref 70–99)
Glucose, Bld: 323 mg/dL — ABNORMAL HIGH (ref 70–99)
Glucose, Bld: 178 mg/dL — ABNORMAL HIGH (ref 70–99)
Potassium: 4.2 mmol/L (ref 3.5–5.1)
Potassium: 4 mmol/L (ref 3.5–5.1)
Potassium: 3.9 mmol/L (ref 3.5–5.1)
Potassium: 4.2 mmol/L (ref 3.5–5.1)
Potassium: 4.3 mmol/L (ref 3.5–5.1)
Sodium: 137 mmol/L (ref 135–145)
Sodium: 138 mmol/L (ref 135–145)
Sodium: 137 mmol/L (ref 135–145)
Sodium: 135 mmol/L (ref 135–145)
Sodium: 136 mmol/L (ref 135–145)

## 2017-09-04 LAB — GLUCOSE, CAPILLARY
Glucose-Capillary: 133 mg/dL — ABNORMAL HIGH (ref 70–99)
Glucose-Capillary: 134 mg/dL — ABNORMAL HIGH (ref 70–99)
Glucose-Capillary: 140 mg/dL — ABNORMAL HIGH (ref 70–99)
Glucose-Capillary: 159 mg/dL — ABNORMAL HIGH (ref 70–99)
Glucose-Capillary: 185 mg/dL — ABNORMAL HIGH (ref 70–99)
Glucose-Capillary: 186 mg/dL — ABNORMAL HIGH (ref 70–99)
Glucose-Capillary: 201 mg/dL — ABNORMAL HIGH (ref 70–99)
Glucose-Capillary: 202 mg/dL — ABNORMAL HIGH (ref 70–99)
Glucose-Capillary: 220 mg/dL — ABNORMAL HIGH (ref 70–99)
Glucose-Capillary: 264 mg/dL — ABNORMAL HIGH (ref 70–99)
Glucose-Capillary: 264 mg/dL — ABNORMAL HIGH (ref 70–99)
Glucose-Capillary: 271 mg/dL — ABNORMAL HIGH (ref 70–99)
Glucose-Capillary: 281 mg/dL — ABNORMAL HIGH (ref 70–99)
Glucose-Capillary: 312 mg/dL — ABNORMAL HIGH (ref 70–99)
Glucose-Capillary: 329 mg/dL — ABNORMAL HIGH (ref 70–99)
Glucose-Capillary: 65 mg/dL — ABNORMAL LOW (ref 70–99)
Glucose-Capillary: 71 mg/dL (ref 70–99)

## 2017-09-04 LAB — HIV ANTIBODY (ROUTINE TESTING W REFLEX): HIV Screen 4th Generation wRfx: NONREACTIVE

## 2017-09-04 MED ORDER — IBUPROFEN 400 MG PO TABS
400.0000 mg | ORAL_TABLET | Freq: Four times a day (QID) | ORAL | Status: DC | PRN
  Administered 2017-09-04: 400 mg via ORAL
  Filled 2017-09-04: qty 1

## 2017-09-04 MED ORDER — DEXTROSE 50 % IV SOLN
INTRAVENOUS | Status: AC
  Administered 2017-09-04: 10:00:00
  Filled 2017-09-04: qty 50

## 2017-09-04 MED ORDER — INSULIN PUMP
SUBCUTANEOUS | Status: DC
  Administered 2017-09-04 (×2): via SUBCUTANEOUS
  Administered 2017-09-04: 6 via SUBCUTANEOUS
  Filled 2017-09-04: qty 1

## 2017-09-04 MED ORDER — SODIUM CHLORIDE 0.9 % IV SOLN
INTRAVENOUS | Status: DC
  Administered 2017-09-04: 12.7 [IU]/h via INTRAVENOUS
  Filled 2017-09-04: qty 1

## 2017-09-04 NOTE — Progress Notes (Signed)
Inpatient Diabetes Program Recommendations  AACE/ADA: New Consensus Statement on Inpatient Glycemic Control (2015)  Target Ranges:  Prepandial:   less than 140 mg/dL      Peak postprandial:   less than 180 mg/dL (1-2 hours)      Critically ill patients:  140 - 180 mg/dL   Lab Results  Component Value Date   GLUCAP 329 (H) 09/04/2017   HGBA1C 7.3 (H) 09/03/2017    Review of Glycemic ControlResults for KENADIE, ROYCE (MRN [id number]) as of 09/04/2017 12:27  Ref. Range 09/04/2017 09:34 09/04/2017 10:08 09/04/2017 11:09 09/04/2017 12:06  Glucose-Capillary Latest Ref Range: 70 - 99 mg/dL 65 (L) 186 (H) 281 (H) 329 (H)    Diabetes history: Type 1 DM Outpatient Diabetes medications: Insulin pump Current orders for Inpatient glycemic control:  IV insulin  Inpatient Diabetes Program Recommendations:    Call received from RN, Ken at 11 am asking how to get insulin for patient's pump.  Explained that order was entered on 8/16 for patient to get vial from pharmacy of [facility] for refill of insulin pump cartridge.  He states he will call pharmacy.  Called patient at 12:25p.  She has not restarted insulin pump yet.  Re-explained that she would get vial from pharmacy for insulin pump to refill cartridge.  She is concerned that blood sugar has increased today.  Explained that this is likely b/c she ate and CHO were not covered.  Needs to reapply insulin pump ASAP and cover CHO for lunch.  Called and discussed with MD and explained that patient does not have insulin pump on yet.  Also discussed with RN that patient needs to restart insulin pump ASAP.   Thanks,  Adah Perl, RN, BC-ADM Inpatient Diabetes Coordinator Pager [phone] (8a-5p)

## 2017-09-04 NOTE — Discharge Summary (Signed)
[facility] Discharge Summary  Patient name: Barbara Clark Medical record number: [id number] Date of birth: Nov 01, 1975 Age: 42 y.o. Gender: female Date of Admission: 09/03/2017  Date of Discharge: 09/04/2017 Admitting Physician: Martyn Malay, MD  Primary Care Provider: [name], MD Consultants: Endocrinology (Dr. Buddy Duty)  Indication for Hospitalization: DKA  Discharge Diagnoses/Problem List:  DKA w/ h/o T1DM, resolved Diabetic neuropathy, stable HTN, stable Bipolar disorder, stable Depression, stable Migraines, stable Tobacco use  Disposition: Home  Discharge Condition: Improved  Discharge Exam:  General: pleasant female in NAD Cardiovascular: RRR, no murmur Respiratory: CTAB, no wheezes Abdomen: soft, NTND, +BS Extremities: warm and well perfused, no LE edema  Brief Hospital Course:  Barbara Clark a 42 y.o.femalewith PMH significant for T1DM with diabetic neuropathy, HTN, migraines, bipolar disorder, depression and tobacco use who presented in DKA in the setting of her insulin pump falling off during the night prior to presentation. Presenting CBG 453, A1c 7.3. Presented with slight AKI to Cr 1.06 but resolved with IV hydration. She was started on an insulin drip in the ED which was transitioned to her home insulin pump and settings per primary endocrinologist, Dr. Buddy Duty, once her CBGs stabilized. She was monitored on her home insulin pump without noted difficulty prior to discharge. She will follow up with Dr. Buddy Duty on discharge.  Issues for Follow Up:  1. No medication changes. Remains on home insulin pump settings:  1. Basal dose 1.6u/hr 2. CHO ratio 1u for every 4.5g carbs 2. She will follow up with primary endocrinologist on discharge.  Significant Procedures: None  Significant Labs and Imaging:  Recent Labs  Lab 09/03/17 1118  WBC 10.8*  HGB 12.8  HCT 40.5  PLT 382   Recent Labs  Lab 09/04/17 0115 09/04/17 0629  09/04/17 0847 09/04/17 1235 09/04/17 1704  NA 137 138 137 135 136  K 4.2 4.0 3.9 4.2 4.3  CL 109 110 110 107 109  CO2 19* 21* 21* 17* 21*  GLUCOSE 149* 191* 75 323* 178*  BUN 8 8 8 10 9   CREATININE 0.70 0.72 0.68 0.82 0.73  CALCIUM 8.1* 8.1* 8.0* 7.9* 8.1*   Results/Tests Pending at Time of Discharge: none  Discharge Medications:  Allergies as of 09/04/2017   No Known Allergies     Medication List    TAKE these medications   ALLERGY 25 MG tablet Generic drug:  diphenhydrAMINE Take 25 mg by mouth as needed for itching or allergies.   aspirin EC 81 MG tablet Take 81 mg by mouth daily.   benztropine 0.5 MG tablet Commonly known as:  COGENTIN Take 0.5 mg by mouth daily.   desvenlafaxine 50 MG 24 hr tablet Commonly known as:  PRISTIQ Take 50 mg by mouth daily.   diclofenac sodium 1 % Gel Commonly known as:  VOLTAREN Apply 4 g topically 4 (four) times daily as needed (knee).   VOLTAREN 1 % Gel Generic drug:  diclofenac sodium Apply 4 grams topically FOUR TIMES DAILY AS NEEDED for knee pain 100/16=7   gabapentin 400 MG capsule Commonly known as:  NEURONTIN Take 400 mg by mouth 3 (three) times daily.   lisinopril 5 MG tablet Commonly known as:  PRINIVIL,ZESTRIL Take 5 mg by mouth daily.   TEGRETOL 200 MG tablet Generic drug:  carbamazepine Take 200 mg by mouth 3 (three) times daily.   traMADol 50 MG tablet Commonly known as:  ULTRAM Take 1 tablet (50 mg total) by mouth every 12 (twelve) hours  as needed.       Discharge Instructions: Please refer to Patient Instructions section of EMR for full details.  Patient was counseled important signs and symptoms that should prompt return to medical care, changes in medications, dietary instructions, activity restrictions, and follow up appointments.   Follow-Up Appointments: Follow-up Information    [name], MD Follow up on 09/14/2017.   Specialty:  Family Medicine Why:  @ 10:50am. Please arrive 15 minutes  prior to appointment time. Contact information: [place] [address] [phone]           Rory Percy, DO 09/04/2017, 6:41 PM PGY-2, [place]

## 2017-09-04 NOTE — Progress Notes (Signed)
Glucose level within range (140-180) for 4 consecutive hours. Insulin drip infusing. Notified Dr. Garlan Fillers, advised to continue with drip and transition to insulin pump at breakfast. Will continue to monitor glucose q1 hour.

## 2017-09-04 NOTE — Discharge Instructions (Signed)
You were admitted for diabetic ketoacidosis due to your insulin pump falling off. You were managed on an insulin drip until your sugars were stabilized and then transitioned back to your home insulin pump. You should follow up with Dr. Buddy Duty within the next week or so.

## 2017-09-04 NOTE — Progress Notes (Signed)
Glucose elevated since diet advanced (210, 312). Pt remains on insulin drip. Dr. Garlan Fillers made aware, no change in orders advised. Will monitor.

## 2017-09-04 NOTE — Progress Notes (Signed)
Family Medicine Teaching Service Daily Progress Note Intern Pager: [phone]  Patient name: Barbara Clark Medical record number: [id number] Date of birth: Apr 24, 1975 Age: 42 y.o. Gender: female  Primary Care Provider: [name], MD Consultants: Endocrinology (Dr. Buddy Duty) Code Status: Full  Pt Overview and Major Events to Date:  8/16 - admitted for DKA  Assessment and Plan: Cassidey Barrales is a 42 y.o. female presenting with hyperglycemia. PMH is significant for T1DM with diabetic neuropathy, HTN, migraines, bipolar disorder, depression and tobacco use.  DKA with h/o T1DM Nausea/vomiting resolved after management on insulin drip overnight. AG now closed. CBG 71 this am with appropriate increase after eating breakfast. Restarted insulin pump with home settings with Dr. Buddy Duty and diabetic coordinator in consult. Will monitor on insulin pump and drip together for about an hour, then pump alone. If stable on pump, can likely d/c later today. - restart insulin pump on home settings (basal 1.6u/hr, CHO ratio 1:4.5g carbs, goal CBG 110-130) - CBG q2h - d/c insulin drip, D5NS 1hr after starting pump - zofran prn  Diabetic neuropathy: Stable. Right sided. Continues on home gabapentin.  -Cont home gabapentin   HTN: BP stable overnight. Takes lisinopril 5mg  daily at home.  -Cont home lisinopril   Bipolar disorder: Stable. Attends individual counseling and weekly group therapy, feels well controlled and not endorsing any symptoms. Taking tegetrol and cogentin at home.  -Cont home Cogentin and Tegetrol   Depression:  Stable. Uses desvenlafaxine at home. (not on formulary at hospital) -replace with venlafaxine XR 37.5 mg daily  Migraines: Stable. Occurs every 2 weeks, takes Excedrin prn with relief. No symptoms currently.   -continue to monitor  Tobacco use: Quit two months ago, taking Chantix at home.  Offered nicotine patch on admission, patient declines.   FEN/GI: Carb  modified PPx: lovenox  Disposition: continue inpatient management of T1DM, now out of DKA. Can likely d/c today if stable on home pump.  Subjective:  Feeling much better this morning. Nausea/vomiting resolved. No difficulties with eating.  Objective: Temp:  [98 F (36.7 C)-98.6 F (37 C)] 98.4 F (36.9 C) (08/17 0542) Pulse Rate:  [91-109] 91 (08/17 0110) Resp:  [13-21] 17 (08/17 0110) BP: (97-139)/(62-79) 97/64 (08/17 0110) SpO2:  [97 %-99 %] 99 % (08/17 0110) Physical Exam: General: pleasant female in NAD Cardiovascular: RRR, no murmur Respiratory: CTAB, no wheezes Abdomen: soft, NTND, +BS Extremities: warm and well perfused, no LE edema  Laboratory: Recent Labs  Lab 09/03/17 1118  WBC 10.8*  HGB 12.8  HCT 40.5  PLT 382   Recent Labs  Lab 09/04/17 0115 09/04/17 0629 09/04/17 0847  NA 137 138 137  K 4.2 4.0 3.9  CL 109 110 110  CO2 19* 21* 21*  BUN 8 8 8   CREATININE 0.70 0.72 0.68  CALCIUM 8.1* 8.1* 8.0*  GLUCOSE 149* 191* 75   Imaging/Diagnostic Tests: No results found.  Rory Percy, DO 09/04/2017, 10:53 AM PGY-2, [place] Intern pager: [phone], text pages welcome

## 2017-09-04 NOTE — Plan of Care (Signed)
Nsg Discharge Note  Admit Date:  09/03/2017 Discharge date: 09/04/2017   Leeanne Rio to be D/C'd Home per MD order.  AVS completed.  Copy for chart, and copy for patient signed, and dated. Patient/caregiver able to verbalize understanding.  Discharge Medication: Allergies as of 09/04/2017   No Known Allergies      Medication List     TAKE these medications    ALLERGY 25 MG tablet Generic drug:  diphenhydrAMINE Take 25 mg by mouth as needed for itching or allergies.   aspirin EC 81 MG tablet Take 81 mg by mouth daily.   benztropine 0.5 MG tablet Commonly known as:  COGENTIN Take 0.5 mg by mouth daily.   desvenlafaxine 50 MG 24 hr tablet Commonly known as:  PRISTIQ Take 50 mg by mouth daily.   diclofenac sodium 1 % Gel Commonly known as:  VOLTAREN Apply 4 g topically 4 (four) times daily as needed (knee).   VOLTAREN 1 % Gel Generic drug:  diclofenac sodium Apply 4 grams topically FOUR TIMES DAILY AS NEEDED for knee pain 100/16=7   gabapentin 400 MG capsule Commonly known as:  NEURONTIN Take 400 mg by mouth 3 (three) times daily.   lisinopril 5 MG tablet Commonly known as:  PRINIVIL,ZESTRIL Take 5 mg by mouth daily.   TEGRETOL 200 MG tablet Generic drug:  carbamazepine Take 200 mg by mouth 3 (three) times daily.   traMADol 50 MG tablet Commonly known as:  ULTRAM Take 1 tablet (50 mg total) by mouth every 12 (twelve) hours as needed.        Discharge Assessment: Vitals:   09/04/17 1100 09/04/17 1600  BP:    Pulse: 92   Resp: (!) 21 16  Temp:    SpO2: 98%    Skin clean, dry and intact without evidence of skin break down, no evidence of skin tears noted. IV catheter discontinued intact. Site without signs and symptoms of complications - no redness or edema noted at insertion site, patient denies c/o pain - only slight tenderness at site.  Dressing with slight pressure applied.  D/c Instructions-Education: Discharge instructions given to  patient/family with verbalized understanding. D/c education completed with patient/family including follow up instructions, medication list, d/c activities limitations if indicated, with other d/c instructions as indicated by MD - patient able to verbalize understanding, all questions fully answered. Patient instructed to return to ED, call 911, or call MD for any changes in condition.  Patient escorted via [place], and D/C home via private auto.  Salley Slaughter, RN 09/04/2017 7:41 PM

## 2017-09-07 ENCOUNTER — Ambulatory Visit: Payer: Medicaid Other | Admitting: Physical Therapy

## 2017-09-07 ENCOUNTER — Encounter: Payer: Self-pay | Admitting: Physical Therapy

## 2017-09-07 DIAGNOSIS — R262 Difficulty in walking, not elsewhere classified: Secondary | ICD-10-CM | POA: Diagnosis present

## 2017-09-07 DIAGNOSIS — M6281 Muscle weakness (generalized): Secondary | ICD-10-CM

## 2017-09-07 DIAGNOSIS — R2689 Other abnormalities of gait and mobility: Secondary | ICD-10-CM | POA: Diagnosis present

## 2017-09-07 DIAGNOSIS — R296 Repeated falls: Secondary | ICD-10-CM

## 2017-09-07 NOTE — Therapy (Signed)
[place], [place], [address] Phone: [phone]   Fax:  [phone]  Physical Therapy Treatment  Patient Details  Name: Barbara Clark MRN: [id number] Date of Birth: 1975/01/25 Referring Provider: Dr Andrena Mews    Encounter Date: 09/07/2017  PT End of Session - 09/07/17 0810    Visit Number  5    Date for PT Re-Evaluation  10/03/17    Authorization Type  [employer]     PT Start Time  0803    PT Stop Time  0845    PT Time Calculation (min)  42 min    Activity Tolerance  Patient tolerated treatment well    Behavior During Therapy  [facility] for tasks assessed/performed       Past Medical History:  Diagnosis Date  . Anxiety   . Depression   . Diabetes mellitus    Type I  . HSV-2 (herpes simplex virus 2) infection 2012   serology    Past Surgical History:  Procedure Laterality Date  . TUBAL LIGATION      There were no vitals filed for this visit.  Subjective Assessment - 09/07/17 0807    Subjective  Patient had increased her walking over the last week. She had an incedent where her insulin pump fell off and she had to go into the hospital. She has been feeling sluggish since.     Pertinent History  DM I;  axiety, depression     Limitations  Standing    How long can you stand comfortably?  20 min     How long can you walk comfortably?  can not walk more then a few steps without her cane before her legs swells up    Diagnostic tests  Nothing recent     Patient Stated Goals  to walk without her cane by 7/17     Currently in Pain?  No/denies                       [facility] Adult PT Treatment/Exercise - 09/07/17 0001      High Level Balance   High Level Balance Comments  NBOS eo and ec 3x20 sec hold; NBOS on air-ex 3x20 sec hold minor loss of balance posterior; side step onto air-ex       Lumbar Exercises: Machines for Strengthening   Leg Press  60 lbs 3x10       Lumbar Exercises:  Supine   Clam Limitations  2x10 green with bridge    Bent Knee Raise  Limitations    Bridge Limitations  2x10     Straight Leg Raises Limitations  2x10 2lb       Knee/Hip Exercises: Supine   Short Arc Quad Sets Limitations  2x15 2lb              PT Education - 09/07/17 0809    Education Details  reviewed activity modification     Person(s) Educated  Patient    Methods  Explanation;Demonstration;Tactile cues;Verbal cues    Comprehension  Verbalized understanding;Returned demonstration;Verbal cues required;Tactile cues required       PT Short Term Goals - 09/07/17 1010      PT SHORT TERM GOAL #1   Title  Patient will increase right hip flexion strength to 4+/5    Baseline  5/5     Time  3    Period  Weeks    Status  Achieved      PT  SHORT TERM GOAL #2   Title  Patient will improve tandem stance to good bilateral     Baseline  continues to require assist. Improved balance to fair     Time  3    Period  Weeks    Status  On-going      PT SHORT TERM GOAL #3   Title  Patient will ambualte 300' without cane with good balance     Baseline  continues to have some loss of balance with cane     Time  3    Period  Weeks    Status  On-going        PT Long Term Goals - 09/01/17 1640      PT LONG TERM GOAL #1   Title  Patient will be indepdnent with HEP that promotes blance, LE strength, and a proper gait pattern.     Baseline  continues to work on HEP     Period  Weeks    Status  New    Target Date  09/29/17      PT LONG TERM GOAL #2   Title  Patient will ambualte 3000' with LRAD with no LOB     Baseline  has posterior loss of balance at times wihtout her cane     Time  4    Period  Weeks    Target Date  09/29/17            Plan - 09/07/17 9798    Clinical Impression Statement  Depsite her setback this weekend the patient tolerated treatement well. Her balance appears to be improving. Therapy added leg press without pain. She reported she is going to try to  walk 10 miles this afternoon. She was advised she has already done pT 10 might be too much.     Clinical Presentation  Stable    Clinical Decision Making  Low    Rehab Potential  Good    PT Frequency  1x / week    PT Duration  4 weeks    PT Treatment/Interventions  ADLs/Self Care Home Management;Cryotherapy;Electrical Stimulation;Iontophoresis 4mg /ml Dexamethasone;Ultrasound;Traction;Functional mobility training;Therapeutic activities;Therapeutic exercise;Stair training;Neuromuscular re-education;Patient/family education;Passive range of motion;Manual techniques;Splinting;Taping    PT Next Visit Plan  continue with balance exercises    PT Home Exercise Plan  bridges, SLR; clamshell red; standing march     Consulted and Agree with Plan of Care  Patient       Patient will benefit from skilled therapeutic intervention in order to improve the following deficits and impairments:  Abnormal gait, Pain, Postural dysfunction, Decreased activity tolerance, Decreased endurance, Decreased range of motion, Decreased strength, Decreased balance  Visit Diagnosis: Other abnormalities of gait and mobility  Muscle weakness (generalized)  Difficulty in walking, not elsewhere classified  Repeated falls     Problem List Patient Active Problem List   Diagnosis Date Noted  . DKA (diabetic ketoacidosis) ([place]) 09/03/2017  . Back pain, lumbosacral 07/06/2017  . Fatigue 01/28/2016  . Left knee pain 12/03/2015  . Right hip pain 10/29/2015  . Left shoulder pain 05/07/2015  . 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 Menorrhagia 04/27/2014  . Chronic leg pain 04/27/2014  . DM neuropathy, type I diabetes mellitus ([place]) 03/13/2014  . Right leg weakness 02/20/2014  . Limb weakness 08/04/2013  . Stuttering 02/02/2013  . Abnormality of gait 12/26/2012  . TOBACCO USER 10/27/2008  . Diabetes mellitus type I ([place]) 03/18/2006  . Major depressive disorder, recurrent episode ([place]) 03/18/2006  .  Migraine headache 03/18/2006    Carney Living PT DPT  09/07/2017, 10:12 AM  [facility] [address] [place], [place], [address] Phone: [phone]   Fax:  [phone]  Name: Barbara Clark MRN: [id number] Date of Birth: Jul 18, 1975

## 2017-09-14 ENCOUNTER — Encounter: Payer: Self-pay | Admitting: Family Medicine

## 2017-09-14 ENCOUNTER — Encounter: Payer: Self-pay | Admitting: Physical Therapy

## 2017-09-14 ENCOUNTER — Ambulatory Visit (INDEPENDENT_AMBULATORY_CARE_PROVIDER_SITE_OTHER): Payer: Medicaid Other | Admitting: Family Medicine

## 2017-09-14 ENCOUNTER — Other Ambulatory Visit: Payer: Self-pay

## 2017-09-14 ENCOUNTER — Ambulatory Visit: Payer: Medicaid Other | Admitting: Physical Therapy

## 2017-09-14 VITALS — BP 105/70 | HR 87 | Temp 97.6°F | Wt 199.0 lb

## 2017-09-14 DIAGNOSIS — Z23 Encounter for immunization: Secondary | ICD-10-CM

## 2017-09-14 DIAGNOSIS — R262 Difficulty in walking, not elsewhere classified: Secondary | ICD-10-CM

## 2017-09-14 DIAGNOSIS — R2689 Other abnormalities of gait and mobility: Secondary | ICD-10-CM

## 2017-09-14 DIAGNOSIS — R296 Repeated falls: Secondary | ICD-10-CM

## 2017-09-14 DIAGNOSIS — M6281 Muscle weakness (generalized): Secondary | ICD-10-CM

## 2017-09-14 DIAGNOSIS — E108 Type 1 diabetes mellitus with unspecified complications: Secondary | ICD-10-CM

## 2017-09-14 NOTE — Progress Notes (Signed)
Subjective:     Patient ID: Barbara Clark, female   DOB: 19-May-1975, 42 y.o.   MRN: [id number]  HPI DM1: Here to f/u after recent hospitalization for DKA. On the day of admission she had exercised vigorously in the morning and afternoon, and then later in the evening, she slept on her insulin pump which then stopped functioning. She attempted to get her glucose down after she woke up but was unsuccessful, hence, she went to the ED. Since d/c from the hospital,her CBG has been fine. This morning it was 115 and has been around 42-212. Occasional symptoms of hypoglycemia. She will eat or drink something sugary and feel better. She saw her endocrinologist 1 week ago and no adjustment was made to her insulin pump.  Health maintenance: Here for routine healthcare update.  Current Outpatient Medications on File Prior to Visit  Medication Sig Dispense Refill  . aspirin EC 81 MG tablet Take 81 mg by mouth daily.    Marland Kitchen desvenlafaxine (PRISTIQ) 50 MG 24 hr tablet Take 50 mg by mouth daily.    Marland Kitchen gabapentin (NEURONTIN) 400 MG capsule Take 400 mg by mouth 3 (three) times daily.    Marland Kitchen lisinopril (PRINIVIL,ZESTRIL) 5 MG tablet Take 5 mg by mouth daily.  3  . TEGRETOL 200 MG tablet Take 200 mg by mouth 3 (three) times daily.  2  . traMADol (ULTRAM) 50 MG tablet Take 1 tablet (50 mg total) by mouth every 12 (twelve) hours as needed. 60 tablet 2  . benztropine (COGENTIN) 0.5 MG tablet Take 0.5 mg by mouth daily.  2  . diclofenac sodium (VOLTAREN) 1 % GEL Apply 4 g topically 4 (four) times daily as needed (knee). (Patient not taking: Reported on 09/03/2017) 100 g 3  . diphenhydrAMINE (ALLERGY) 25 MG tablet Take 25 mg by mouth as needed for itching or allergies.    . VOLTAREN 1 % GEL Apply 4 grams topically FOUR TIMES DAILY AS NEEDED for knee pain 100/16=7 (Patient not taking: Reported on 07/06/2017) 100 g 3   No current facility-administered medications on file prior to visit.    Past Medical History:  Diagnosis  Date  . Anxiety   . Depression   . Diabetes mellitus    Type I  . HSV-2 (herpes simplex virus 2) infection 2012   serology     Review of Systems  Constitutional: Negative.   Respiratory: Negative.   Cardiovascular: Negative.   Gastrointestinal: Negative.   Genitourinary: Negative.   Neurological: Negative.   All other systems reviewed and are negative.      Objective:   Physical Exam  Constitutional: She is oriented to person, place, and time. She appears well-developed. No distress.  Cardiovascular: Normal rate, regular rhythm and normal heart sounds.  No murmur heard. Pulmonary/Chest: Effort normal and breath sounds normal. No stridor. No respiratory distress. She has no wheezes.  Abdominal: Soft. Bowel sounds are normal. She exhibits no distension and no mass. There is no tenderness.  Musculoskeletal: Normal range of motion. She exhibits no edema.  Neurological: She is alert and oriented to person, place, and time. No cranial nerve deficit.  Sensory exam of the foot is normal, tested with the monofilament. Good pulses, no lesions or ulcers, good peripheral pulses.   Psychiatric: She has a normal mood and affect.  Nursing note and vitals reviewed.      Assessment:     DM1 Health maintenance    Plan:     Check problem list. For HM,  flu shot was given today. She is advised schedule f/u appointment with her eye doc for her yearly eye exam. She agreed with the plan.

## 2017-09-14 NOTE — Assessment & Plan Note (Signed)
Recent DKA. Doing well since d/c from the hospital. Recent Endo f/u. Schedule f/u as plan. Continue Insulin pump as recommended. Information given about hypoglycemia symptoms and management. F/U sooner if there is any concern.

## 2017-09-14 NOTE — Patient Instructions (Signed)
Hypoglycemia Hypoglycemia is when the sugar (glucose) level in the blood is too low. Symptoms of low blood sugar may include:  Feeling: ? Hungry. ? Worried or nervous (anxious). ? Sweaty and clammy. ? Confused. ? Dizzy. ? Sleepy. ? Sick to your stomach (nauseous).  Having: ? A fast heartbeat. ? A headache. ? A change in your vision. ? Jerky movements that you cannot control (seizure). ? Nightmares. ? Tingling or no feeling (numbness) around the mouth, lips, or tongue.  Having trouble with: ? Talking. ? Paying attention (concentrating). ? Moving (coordination). ? Sleeping.  Shaking.  Passing out (fainting).  Getting upset easily (irritability).  Low blood sugar can happen to people who have diabetes and people who do not have diabetes. Low blood sugar can happen quickly, and it can be an emergency. Treating Low Blood Sugar Low blood sugar is often treated by eating or drinking something sugary right away. If you can think clearly and swallow safely, follow the 15:15 rule:  Take 15 grams of a fast-acting carb (carbohydrate). Some fast-acting carbs are: ? 1 tube of glucose gel. ? 3 sugar tablets (glucose pills). ? 6-8 pieces of hard candy. ? 4 oz (120 mL) of fruit juice. ? 4 oz (120 mL) of regular (not diet) soda.  Check your blood sugar 15 minutes after you take the carb.  If your blood sugar is still at or below 70 mg/dL (3.9 mmol/L), take 15 grams of a carb again.  If your blood sugar does not go above 70 mg/dL (3.9 mmol/L) after 3 tries, get help right away.  After your blood sugar goes back to normal, eat a meal or a snack within 1 hour.  Treating Very Low Blood Sugar If your blood sugar is at or below 54 mg/dL (3 mmol/L), you have very low blood sugar (severe hypoglycemia). This is an emergency. Do not wait to see if the symptoms will go away. Get medical help right away. Call your local emergency services (911 in the [place] Do not drive yourself to the  hospital. If you have very low blood sugar and you cannot eat or drink, you may need a glucagon shot (injection). A family member or friend should learn how to check your blood sugar and how to give you a glucagon shot. Ask your doctor if you need to have a glucagon shot kit at home. Follow these instructions at home: General instructions  Avoid any diets that cause you to not eat enough food. Talk with your doctor before you start any new diet.  Take over-the-counter and prescription medicines only as told by your doctor.  Limit alcohol to no more than 1 drink per day for nonpregnant women and 2 drinks per day for men. One drink equals 12 oz of beer, 5 oz of wine, or 1 oz of hard liquor.  Keep all follow-up visits as told by your doctor. This is important. If You Have Diabetes:   Make sure you know the symptoms of low blood sugar.  Always keep a source of sugar with you, such as: ? Sugar. ? Sugar tablets. ? Glucose gel. ? Fruit juice. ? Regular soda (not diet soda). ? Milk. ? Hard candy. ? Honey.  Take your medicines as told.  Follow your exercise and meal plan. ? Eat on time. Do not skip meals. ? Follow your sick day plan when you cannot eat or drink normally. Make this plan ahead of time with your doctor.  Check your blood sugar as often   as told by your doctor. Always check before and after exercise.  Share your diabetes care plan with: ? Your work or school. ? People you live with.  Check your pee (urine) for ketones: ? When you are sick. ? As told by your doctor.  Carry a card or wear jewelry that says you have diabetes. If You Have Low Blood Sugar From Other Causes:   Check your blood sugar as often as told by your doctor.  Follow instructions from your doctor about what you cannot eat or drink. Contact a doctor if:  You have trouble keeping your blood sugar in your target range.  You have low blood sugar often. Get help right away if:  You still have  symptoms after you eat or drink something sugary.  Your blood sugar is at or below 54 mg/dL (3 mmol/L).  You have jerky movements that you cannot control.  You pass out. These symptoms may be an emergency. Do not wait to see if the symptoms will go away. Get medical help right away. Call your local emergency services (911 in the [place] Do not drive yourself to the hospital. This information is not intended to replace advice given to you by your health care provider. Make sure you discuss any questions you have with your health care provider. Document Released: 04/01/2009 Document Revised: 06/13/2015 Document Reviewed: 02/08/2015 Elsevier Interactive Patient Education  [employer].

## 2017-09-14 NOTE — Patient Instructions (Signed)
Issued from exercise drawer: 1-2 x a day Scissors level 2 10 x 1 breath hold each  Wall slides facing wall 1-2 x a day 10 x each Level 1 Pause vs hold. Repeat each leg forward.

## 2017-09-14 NOTE — Therapy (Signed)
[facility] [place], [place], [address] Phone: [phone]   Fax:  [phone]  Physical Therapy Treatment  Patient Details  Name: Barbara Clark MRN: [id number] Date of Birth: 30-Jun-1975 Referring Provider: Dr Andrena Mews    Encounter Date: 09/14/2017  PT End of Session - 09/14/17 0904    Visit Number  6    Number of Visits  8    Date for PT Re-Evaluation  10/03/17    PT Start Time  0804    PT Stop Time  0846    PT Time Calculation (min)  42 min    Activity Tolerance  Patient tolerated treatment well    Behavior During Therapy  [facility] for tasks assessed/performed       Past Medical History:  Diagnosis Date  . Anxiety   . Depression   . Diabetes mellitus    Type I  . HSV-2 (herpes simplex virus 2) infection 2012   serology    Past Surgical History:  Procedure Laterality Date  . TUBAL LIGATION      There were no vitals filed for this visit.  Subjective Assessment - 09/14/17 0809    Subjective  I have been doing a lot of walking.  I am feeling OK less .  I am exercising to change my life.     Currently in Pain?  No/denies                       [facility] Adult PT Treatment/Exercise - 09/14/17 0001      Ambulation/Gait   Assistive device  Straight cane    Ambulation Surface  Level    Gait velocity  decreased    Pre-Gait Activities  weight shifting  wall slides facing wall, keeping both feet on floor 10 x each    Gait Comments  shortened cane, placed cane in left hand for improved gait,  cued  as needed.  Education of importance of using correct technique over and over to relearn patterns of walking vs walking with cane in right hand,  walking without cane and poor technique not benificial.    walks like a toddler  learning to walk without cane     Lumbar Exercises: Supine   Bridge  --   to fatigue 10+ reps   Single Leg Bridge  Other (comment)   cues.  lifted to fatigue 5+ reps   Other  Supine Lumbar Exercises  scissors level 2 10 X cues             PT Education - 09/14/17 0903    Education Details  HEP,  gait training.    Person(s) Educated  Patient    Methods  Explanation;Demonstration;Tactile cues;Verbal cues;Handout    Comprehension  Verbalized understanding;Returned demonstration       PT Short Term Goals - 09/14/17 0907      PT SHORT TERM GOAL #1   Title  Patient will increase right hip flexion strength to 4+/5    Time  3    Period  Weeks    Status  Achieved      PT SHORT TERM GOAL #2   Title  Patient will improve tandem stance to good bilateral     Time  3    Period  Weeks    Status  Unable to assess      PT SHORT TERM GOAL #3   Title  Patient will ambualte 300' without cane with good balance  Baseline  gait/ balance  improving,  in clinic 100+ feet with cane.    Time  3    Period  Weeks    Status  On-going        PT Long Term Goals - 09/01/17 1640      PT LONG TERM GOAL #1   Title  Patient will be indepdnent with HEP that promotes blance, LE strength, and a proper gait pattern.     Baseline  continues to work on [place]    Target Date  09/29/17      PT LONG TERM GOAL #2   Title  Patient will ambualte 3000' with LRAD with no LOB     Baseline  has posterior loss of balance at times wihtout her cane     Time  4    Period  Weeks    Target Date  09/29/17            Plan - 09/14/17 0904    Clinical Impression Statement  Patient continues to work hard at home and in community with HEP.  Education continued with gait.  Gait improved with correct technique.  Progress made toward gait goal. No pain at end of session however her legs could feel the burn with exercise.     PT Next Visit Plan  continue with balance exercises    PT Home Exercise Plan  bridges, SLR; clamshell red; standing march ,  scissors level 2, wall slides facing wall.     Consulted and Agree with Plan of Care  Patient        Patient will benefit from skilled therapeutic intervention in order to improve the following deficits and impairments:     Visit Diagnosis: Other abnormalities of gait and mobility  Muscle weakness (generalized)  Difficulty in walking, not elsewhere classified  Repeated falls     Problem List Patient Active Problem List   Diagnosis Date Noted  . DKA (diabetic ketoacidosis) ([place]) 09/03/2017  . Back pain, lumbosacral 07/06/2017  . Fatigue 01/28/2016  . Left knee pain 12/03/2015  . Right hip pain 10/29/2015  . Left shoulder pain 05/07/2015  . 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 Menorrhagia 04/27/2014  . Chronic leg pain 04/27/2014  . DM neuropathy, type I diabetes mellitus ([place]) 03/13/2014  . Right leg weakness 02/20/2014  . Limb weakness 08/04/2013  . Stuttering 02/02/2013  . Abnormality of gait 12/26/2012  . TOBACCO USER 10/27/2008  . Diabetes mellitus type I ([place]) 03/18/2006  . Major depressive disorder, recurrent episode ([place]) 03/18/2006  . Migraine headache 03/18/2006    HARRIS,KAREN PTA 09/14/2017, 9:13 AM  [facility] [address] [place], [place], [address] Phone: [phone]   Fax:  [phone]  Name: Barbara Clark MRN: [id number] Date of Birth: [birth year]

## 2017-09-21 ENCOUNTER — Ambulatory Visit: Payer: Medicaid Other | Attending: Family Medicine | Admitting: Physical Therapy

## 2017-09-21 ENCOUNTER — Encounter: Payer: Self-pay | Admitting: Physical Therapy

## 2017-09-21 DIAGNOSIS — R262 Difficulty in walking, not elsewhere classified: Secondary | ICD-10-CM | POA: Diagnosis present

## 2017-09-21 DIAGNOSIS — R2689 Other abnormalities of gait and mobility: Secondary | ICD-10-CM | POA: Insufficient documentation

## 2017-09-21 DIAGNOSIS — R296 Repeated falls: Secondary | ICD-10-CM | POA: Diagnosis present

## 2017-09-21 DIAGNOSIS — M6281 Muscle weakness (generalized): Secondary | ICD-10-CM | POA: Diagnosis present

## 2017-09-21 NOTE — Therapy (Signed)
[place], [place], [address] Phone: [phone]   Fax:  [phone]  Physical Therapy Treatment  Patient Details  Name: Barbara Clark MRN: [id number] Date of Birth: [birth year] Referring Provider: Dr Andrena Mews    Encounter Date: 09/21/2017  PT End of Session - 09/21/17 0806    Visit Number  7    Number of Visits  8    Date for PT Re-Evaluation  10/03/17    Authorization Type  [employer]     PT Start Time  0800    PT Stop Time  0840    PT Time Calculation (min)  40 min    Equipment Utilized During Treatment  Gait belt    Activity Tolerance  Patient tolerated treatment well       Past Medical History:  Diagnosis Date  . Anxiety   . Depression   . Diabetes mellitus    Type I  . HSV-2 (herpes simplex virus 2) infection 2012   serology    Past Surgical History:  Procedure Laterality Date  . TUBAL LIGATION      There were no vitals filed for this visit.  Subjective Assessment - 09/21/17 0805    Subjective  Patient feels like her balance is improving but che still is nopt completly comfortable walking. She is still walking several miles a day. She is walking withou tthe cane at home. She still uses it out in the public.Mar[place] Kitchen     Pertinent History  DM I;  axiety, depression     Limitations  Standing    How long can you stand comfortably?  20 min     How long can you walk comfortably?  can not walk more then a few steps without her cane before her legs swells up    Diagnostic tests  Nothing recent     Patient Stated Goals  to walk without her cane by 7/17     Currently in Pain?  No/denies                       [facility] Adult PT Treatment/Exercise - 09/21/17 0001      Ambulation/Gait   Gait Comments  ambualtion without cane. Cuing for weight bearing on the right. Cuing for proper arm swing. Patient hesitant to weight bear. Ambualted 400'. She then did band walk and walked andother 200.  Improved BOS and sepeed of gait after band walk.       High Level Balance   High Level Balance Comments  --      Lumbar Exercises: Machines for Strengthening   Leg Press  60 lbs 3x10       Lumbar Exercises: Standing   Other Standing Lumbar Exercises  lateral band walk 4x5 forward band walk 4 laps at counter       Lumbar Exercises: Supine   Clam Limitations  2x10 green with bridge    Bent Knee Raise  Limitations    Single Leg Bridge  Other (comment)   cues.  lifted to fatigue 5+ reps   Other Supine Lumbar Exercises  scissors level 2 10 X cues; bridge with band 2x10;              PT Education - 09/21/17 0806    Education Details  reviewed gait training     Person(s) Educated  Patient    Methods  Explanation;Demonstration;Verbal cues;Tactile cues    Comprehension  Verbalized understanding;Returned demonstration;Verbal cues required;Tactile cues  required;Need further instruction       PT Short Term Goals - 09/21/17 1123      PT SHORT TERM GOAL #1   Title  Patient will increase right hip flexion strength to 4+/5    Baseline  5/5     Time  3    Period  Weeks    Status  Achieved      PT SHORT TERM GOAL #2   Title  Patient will improve tandem stance to good bilateral     Baseline  continues to require assist. Improved balance to fair     Time  3    Period  Weeks    Status  On-going      PT SHORT TERM GOAL #3   Title  Patient will ambualte 300' without cane with good balance     Baseline  gait/ balance  improving,  in clinic 100+ feet with cane.    Time  3    Period  Weeks    Status  On-going        PT Long Term Goals - 09/01/17 1640      PT LONG TERM GOAL #1   Title  Patient will be indepdnent with HEP that promotes blance, LE strength, and a proper gait pattern.     Baseline  continues to work on HEP     Period  Weeks    Status  New    Target Date  09/29/17      PT LONG TERM GOAL #2   Title  Patient will ambualte 3000' with LRAD with no LOB     Baseline   has posterior loss of balance at times wihtout her cane     Time  4    Period  Weeks    Target Date  09/29/17            Plan - 09/21/17 1049    Clinical Impression Statement  The patiens right leg issue appears to continue to be a confidenceisssue. She vcan perfrom higher level exercises with the leg. her stability in thatleg appears to be improving but when she walks without her cane she is very hesistant to weight bear on that leg. After band walks she demonstrated an improved gait pattern. She had no loss of balance with gai twithout the cane but required gaurding and cuing not to put her arms out to the sides for balance.     Clinical Presentation  Stable    Clinical Decision Making  Low    Rehab Potential  Good    PT Frequency  1x / week    PT Duration  4 weeks    PT Treatment/Interventions  ADLs/Self Care Home Management;Cryotherapy;Electrical Stimulation;Iontophoresis 4mg /ml Dexamethasone;Ultrasound;Traction;Functional mobility training;Therapeutic activities;Therapeutic exercise;Stair training;Neuromuscular re-education;Patient/family education;Passive range of motion;Manual techniques;Splinting;Taping    PT Next Visit Plan  continue with balance exercises    PT Home Exercise Plan  bridges, SLR; clamshell red; standing march ,  scissors level 2, wall slides facing wall.     Consulted and Agree with Plan of Care  Patient       Patient will benefit from skilled therapeutic intervention in order to improve the following deficits and impairments:  Abnormal gait, Pain, Postural dysfunction, Decreased activity tolerance, Decreased endurance, Decreased range of motion, Decreased strength, Decreased balance  Visit Diagnosis: Other abnormalities of gait and mobility  Muscle weakness (generalized)  Difficulty in walking, not elsewhere classified  Repeated falls     Problem List Patient Active  Problem List   Diagnosis Date Noted  . DKA (diabetic ketoacidosis) ([place])  09/03/2017  . Back pain, lumbosacral 07/06/2017  . Fatigue 01/28/2016  . Left knee pain 12/03/2015  . Right hip pain 10/29/2015  . Left shoulder pain 05/07/2015  . 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 Menorrhagia 04/27/2014  . Chronic leg pain 04/27/2014  . DM neuropathy, type I diabetes mellitus ([place]) 03/13/2014  . Right leg weakness 02/20/2014  . Limb weakness 08/04/2013  . Stuttering 02/02/2013  . Abnormality of gait 12/26/2012  . TOBACCO USER 10/27/2008  . Diabetes mellitus type I ([place]) 03/18/2006  . Major depressive disorder, recurrent episode ([place]) 03/18/2006  . Migraine headache 03/18/2006    Carney Living PT DPT  09/21/2017, 11:24 AM  [facility] [address] [place], [place], [address] Phone: [phone]   Fax:  [phone]  Name: Margretta Zamorano MRN: [id number] Date of Birth: 1976-01-04

## 2017-10-01 ENCOUNTER — Encounter: Payer: Self-pay | Admitting: Physical Therapy

## 2017-10-01 ENCOUNTER — Ambulatory Visit: Payer: Medicaid Other | Admitting: Physical Therapy

## 2017-10-01 DIAGNOSIS — R2689 Other abnormalities of gait and mobility: Secondary | ICD-10-CM | POA: Diagnosis not present

## 2017-10-01 DIAGNOSIS — R262 Difficulty in walking, not elsewhere classified: Secondary | ICD-10-CM

## 2017-10-01 DIAGNOSIS — M6281 Muscle weakness (generalized): Secondary | ICD-10-CM

## 2017-10-01 DIAGNOSIS — R296 Repeated falls: Secondary | ICD-10-CM

## 2017-10-01 NOTE — Therapy (Signed)
[place], [place], [address] Phone: [phone]   Fax:  [phone]  Physical Therapy Treatment/ Re-eval   Patient Details  Name: Barbara Clark MRN: [id number] Date of Birth: [birth year] Referring Provider: Dr Caprice Renshaw    Encounter Date: 10/01/2017  PT End of Session - 10/01/17 0958    Visit Number  8    Number of Visits  8    Date for PT Re-Evaluation  10/03/17    Authorization Type  Mediciad     PT Start Time  0845    PT Stop Time  0928    PT Time Calculation (min)  43 min    Activity Tolerance  Patient tolerated treatment well    Behavior During Therapy  [facility] for tasks assessed/performed       Past Medical History:  Diagnosis Date  . Anxiety   . Depression   . Diabetes mellitus    Type I  . HSV-2 (herpes simplex virus 2) infection 2012   serology    Past Surgical History:  Procedure Laterality Date  . TUBAL LIGATION      There were no vitals filed for this visit.  Subjective Assessment - 10/01/17 0954    Subjective  Patient reports she feels like she is making progress. she i susing her cane less. When she walks her 5 miles she does have a burning on the inside of her thigh. She has been working on her exercises but mostly focusing on her walking.     Pertinent History  DM I;  axiety, depression     Limitations  Standing    How long can you stand comfortably?  20 min     How long can you walk comfortably?  can not walk more then a few steps without her cane before her legs swells up    Diagnostic tests  Nothing recent     Patient Stated Goals  to walk without her cane by 7/17     Currently in Pain?  No/denies         [facility] PT Assessment - 10/01/17 0001      Assessment   Medical Diagnosis  Gait abnormality     Referring Provider  Dr Caprice Renshaw     Onset Date/Surgical Date  --   Oneset of weakness 5 years prior    Hand Dominance  Right    Next MD Visit  3 months     Prior Therapy  Had 1 visit 2 years ago.       Precautions   Precautions  None      Restrictions   Weight Bearing Restrictions  No      Home Environment   Additional Comments  No steps at her house       Prior Function   Level of Independence  Requires assistive device for independence    Vocation  On disability    Vocation Requirements  Has a job lined up.     Leisure  Going back to the gym, walking       Cognition   Overall Cognitive Status  Within Functional Limits for tasks assessed    Attention  Focused    Focused Attention  Appears intact    Memory  Appears intact    Awareness  Appears intact    Problem Solving  Appears intact      Observation/Other Assessments   Observations  rounded shoulders     Focus on Therapeutic  Outcomes (FOTO)   Mediciad       Sensation   Additional Comments  No numbness at this time since the insulin pump       Coordination   Gross Motor Movements are Fluid and Coordinated  Yes    Fine Motor Movements are Fluid and Coordinated  Yes      Posture/Postural Control   Posture Comments  rounded shoulders       AROM   Overall AROM Comments  limited active hip flexion on the right       PROM   Overall PROM Comments  Normal PROM of the right hip       Strength   Right Hip Flexion  4+/5    Right Hip ABduction  5/5    Right Hip ADduction  4+/5    Left Hip Flexion  5/5    Left Hip ABduction  5/5    Left Hip ADduction  5/5    Right Knee Flexion  5/5    Right Knee Extension  5/5    Left Knee Flexion  5/5    Left Knee Extension  5/5      Palpation   Palpation comment  No tenderness to paplation       High Level Balance   High Level Balance Comments  narrow base of support 20 seconds; NBOS eyes closed min a; tandem stance Mod a bilateral more assist required with right to the back; Can not perfrom single leg stance bilateral;                    [facility] Adult PT Treatment/Exercise - 10/01/17 0001      Ambulation/Gait   Gait  Comments  Patient is walking without a cane but her gait speed is still very slow and she abducts her right leg out ot the side. Gait training in the parreleel bar. Mod cuing to correct right leg abduction; walking with 2lb weight on the right leg with emphasis on hip flexion; walking chnaging speeds;       High Level Balance   High Level Balance Comments  NBOS eyes closed 3x30 sec hold; tandem stance left back SBA; right back moderate assistance.       Lumbar Exercises: Supine   Clam Limitations  x20     Bridge  20 reps    Straight Leg Raises Limitations  2x10 2lb     Other Supine Lumbar Exercises   2x10 2lb              PT Education - 10/01/17 0954    Education Details  reviewed POC going forward and balance.    Person(s) Educated  Patient    Methods  Explanation;Demonstration;Tactile cues;Verbal cues    Comprehension  Verbalized understanding;Returned demonstration;Verbal cues required;Tactile cues required;Need further instruction       PT Short Term Goals - 10/01/17 1043      PT SHORT TERM GOAL #1   Title  Patient will increase right hip flexion strength to 4+/5    Baseline  4+/5 hip flexion right all other measurements 5/5       PT SHORT TERM GOAL #2   Title  Patient will improve tandem stance to good bilateral     Baseline  good on solid ground ; poor on air-ex     Time  3    Period  Weeks    Status  On-going      PT SHORT TERM GOAL #3  Title  Patient will ambualte 300' without cane with good balance     Baseline  walking without a cane but her balance is still off and she is slow.     Time  3    Period  Weeks    Status  On-going        PT Long Term Goals - 10/01/17 1128      PT LONG TERM GOAL #1   Title  Patient will be indepdnent with HEP that promotes blance, LE strength, and a proper gait pattern.     Baseline  continues to progress balance activity     Time  4    Period  Weeks    Status  Not Met      PT LONG TERM GOAL #2   Title  Patient will  ambualte 3000' with LRAD with no LOB     Baseline  has posterior loss of balance at times wihtout her cane     Time  4    Period  Weeks    Status  Not Met      PT LONG TERM GOAL #3   Title  Patient will ambaulte 1000' with normal gait speed     Baseline  decreased gait speed for her age.     Time  4    Period  Weeks    Status  New            Plan - 10/01/17 1010    Clinical Impression Statement  Patients strength and dynamic balance have improved. She continues to feell pain when she is walking too far but that has improved as well. Her right hip flexion strength is 4+/5. All other strength measurements are 5/5. She has improved dynamic balance. She is able to maintain balance on air-ex with NBOS. She was able to maintain tandem stance on the left but not able to maintain with the right behind.  Her gait speed is very slow at this time. She needs to workon bulding confidence in the right leg.     Clinical Presentation  Stable    Clinical Decision Making  Low    Rehab Potential  Good    PT Frequency  1x / week    PT Duration  4 weeks    PT Treatment/Interventions  ADLs/Self Care Home Management;Cryotherapy;Electrical Stimulation;Iontophoresis 71m/ml Dexamethasone;Ultrasound;Traction;Functional mobility training;Therapeutic activities;Therapeutic exercise;Stair training;Neuromuscular re-education;Patient/family education;Passive range of motion;Manual techniques;Splinting;Taping    PT Next Visit Plan  continue with balance exercises    PT Home Exercise Plan  bridges, SLR; clamshell red; standing march ,  scissors level 2, wall slides facing wall.     Consulted and Agree with Plan of Care  Patient       Patient will benefit from skilled therapeutic intervention in order to improve the following deficits and impairments:  Abnormal gait, Pain, Postural dysfunction, Decreased activity tolerance, Decreased endurance, Decreased range of motion, Decreased strength, Decreased balance  Visit  Diagnosis: Other abnormalities of gait and mobility  Muscle weakness (generalized)  Difficulty in walking, not elsewhere classified  Repeated falls     Problem List Patient Active Problem List   Diagnosis Date Noted  . DKA (diabetic ketoacidosis) ([place] 09/03/2017  . Back pain, lumbosacral 07/06/2017  . Fatigue 01/28/2016  . Left knee pain 12/03/2015  . Right hip pain 10/29/2015  . Left shoulder pain 05/07/2015  . 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 Menorrhagia 04/27/2014  . Chronic leg pain 04/27/2014  . DM neuropathy,  type I diabetes mellitus ([place]) 03/13/2014  . Right leg weakness 02/20/2014  . Limb weakness 08/04/2013  . Stuttering 02/02/2013  . Abnormality of gait 12/26/2012  . TOBACCO USER 10/27/2008  . Diabetes mellitus type I ([place]) 03/18/2006  . Major depressive disorder, recurrent episode ([place]) 03/18/2006  . Migraine headache 03/18/2006    Carney Living PT DPT  10/01/2017, 11:30 AM  [place], [place], [address] Phone: [phone]   Fax:  [phone]  Name: Barbara Clark MRN: [id number] Date of Birth: 1975-09-12

## 2017-10-08 ENCOUNTER — Encounter: Payer: Self-pay | Admitting: Physical Therapy

## 2017-10-08 ENCOUNTER — Ambulatory Visit: Payer: Medicaid Other | Admitting: Physical Therapy

## 2017-10-08 DIAGNOSIS — R2689 Other abnormalities of gait and mobility: Secondary | ICD-10-CM | POA: Diagnosis not present

## 2017-10-08 DIAGNOSIS — M6281 Muscle weakness (generalized): Secondary | ICD-10-CM

## 2017-10-08 DIAGNOSIS — R296 Repeated falls: Secondary | ICD-10-CM

## 2017-10-08 DIAGNOSIS — R262 Difficulty in walking, not elsewhere classified: Secondary | ICD-10-CM

## 2017-10-08 NOTE — Therapy (Signed)
[place], [place], [address] Phone: [phone]   Fax:  [phone]  Physical Therapy Treatment  Patient Details  Name: Barbara Clark MRN: [id number] Date of Birth: [birth year] Referring Provider: Dr Caprice Renshaw    Encounter Date: 10/08/2017  PT End of Session - 10/08/17 0849    Visit Number  9    Number of Visits  12    Date for PT Re-Evaluation  10/29/17    Authorization Type  Mediciad     Authorization Time Period  10/06/2017-11/02/2017    Authorization - Visit Number  1    Authorization - Number of Visits  4    PT Start Time  0845    PT Stop Time  0930    PT Time Calculation (min)  45 min       Past Medical History:  Diagnosis Date  . Anxiety   . Depression   . Diabetes mellitus    Type I  . HSV-2 (herpes simplex virus 2) infection 2012   serology    Past Surgical History:  Procedure Laterality Date  . TUBAL LIGATION      There were no vitals filed for this visit.  Subjective Assessment - 10/08/17 0848    Subjective  Pt reports walking without cane but cannot make it 5 miles.     Currently in Pain?  No/denies                       [facility] Adult PT Treatment/Exercise - 10/08/17 0001      Exercises   Exercises  Knee/Hip      Lumbar Exercises: Machines for Strengthening   Leg Press  Rt leg 1 plate x 15       Lumbar Exercises: Supine   Bridge  20 reps    Single Leg Bridge  10 reps    Straight Leg Raises Limitations  10 reps each with core brace       Knee/Hip Exercises: Stretches   Gastroc Stretch  3 reps;20 seconds      Knee/Hip Exercises: Aerobic   Nustep  L5 UE/LE x 5 minutes       Knee/Hip Exercises: Standing   Heel Raises  5 reps    Heel Raises Limitations  single     Forward Step Up  15 reps;Hand Hold: 1;Step Height: 6"    Other Standing Knee Exercises  standing wide stance pertubations       Knee/Hip Exercises: Seated   Sit to Sand  10  reps;without UE support   HHA at first, looses balance backward              PT Short Term Goals - 10/01/17 1043      PT SHORT TERM GOAL #1   Title  Patient will increase right hip flexion strength to 4+/5    Baseline  4+/5 hip flexion right all other measurements 5/5       PT SHORT TERM GOAL #2   Title  Patient will improve tandem stance to good bilateral     Baseline  good on solid ground ; poor on air-ex     Time  3    Period  Weeks    Status  On-going      PT SHORT TERM GOAL #3   Title  Patient will ambualte 300' without cane with good balance     Baseline  walking without a cane but her balance is  still off and she is slow.     Time  3    Period  Weeks    Status  On-going        PT Long Term Goals - 10/01/17 1128      PT LONG TERM GOAL #1   Title  Patient will be indepdnent with HEP that promotes blance, LE strength, and a proper gait pattern.     Baseline  continues to progress balance activity     Time  4    Period  Weeks    Status  Not Met      PT LONG TERM GOAL #2   Title  Patient will ambualte 3000' with LRAD with no LOB     Baseline  has posterior loss of balance at times wihtout her cane     Time  4    Period  Weeks    Status  Not Met      PT LONG TERM GOAL #3   Title  Patient will ambaulte 1000' with normal gait speed     Baseline  decreased gait speed for her age.     Time  4    Period  Weeks    Status  New            Plan - 10/08/17 9480    Clinical Impression Statement  Focused closed chain exercises to improve confidence in RLE. Able to perform 5 to 8 reps of single heel raises bilateral and reported leg fatigue afterward. With sit-stands she had several LOB backward and needed HHA to recover.     PT Next Visit Plan  continue with balance exercises    PT Home Exercise Plan  bridges, SLR; clamshell red; standing march ,  scissors level 2, wall slides facing wall.     Consulted and Agree with Plan of Care  Patient       Patient  will benefit from skilled therapeutic intervention in order to improve the following deficits and impairments:  Abnormal gait, Pain, Postural dysfunction, Decreased activity tolerance, Decreased endurance, Decreased range of motion, Decreased strength, Decreased balance  Visit Diagnosis: Other abnormalities of gait and mobility  Muscle weakness (generalized)  Difficulty in walking, not elsewhere classified  Repeated falls     Problem List Patient Active Problem List   Diagnosis Date Noted  . DKA (diabetic ketoacidosis) ([place]) 09/03/2017  . Back pain, lumbosacral 07/06/2017  . Fatigue 01/28/2016  . Left knee pain 12/03/2015  . Right hip pain 10/29/2015  . Left shoulder pain 05/07/2015  . 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 Menorrhagia 04/27/2014  . Chronic leg pain 04/27/2014  . DM neuropathy, type I diabetes mellitus ([place]) 03/13/2014  . Right leg weakness 02/20/2014  . Limb weakness 08/04/2013  . Stuttering 02/02/2013  . Abnormality of gait 12/26/2012  . TOBACCO USER 10/27/2008  . Diabetes mellitus type I ([place]) 03/18/2006  . Major depressive disorder, recurrent episode ([place]) 03/18/2006  . Migraine headache 03/18/2006    Dorene Ar, PTA 10/08/2017, 9:29 AM  [place], [place], [address] Phone: [phone]   Fax:  [phone]  Name: Barbara Clark MRN: [id number] Date of Birth: Nov 26, 1975

## 2017-10-11 ENCOUNTER — Encounter: Payer: Self-pay | Admitting: Family Medicine

## 2017-10-12 ENCOUNTER — Ambulatory Visit (INDEPENDENT_AMBULATORY_CARE_PROVIDER_SITE_OTHER): Payer: Medicaid Other | Admitting: Family Medicine

## 2017-10-12 ENCOUNTER — Other Ambulatory Visit: Payer: Self-pay

## 2017-10-12 ENCOUNTER — Encounter: Payer: Self-pay | Admitting: Family Medicine

## 2017-10-12 VITALS — BP 100/60 | HR 93 | Temp 98.0°F | Ht 67.0 in | Wt 204.0 lb

## 2017-10-12 DIAGNOSIS — E669 Obesity, unspecified: Secondary | ICD-10-CM | POA: Insufficient documentation

## 2017-10-12 DIAGNOSIS — E663 Overweight: Secondary | ICD-10-CM

## 2017-10-12 DIAGNOSIS — Z6839 Body mass index (BMI) 39.0-39.9, adult: Secondary | ICD-10-CM | POA: Insufficient documentation

## 2017-10-12 DIAGNOSIS — F339 Major depressive disorder, recurrent, unspecified: Secondary | ICD-10-CM

## 2017-10-12 DIAGNOSIS — E1040 Type 1 diabetes mellitus with diabetic neuropathy, unspecified: Secondary | ICD-10-CM

## 2017-10-12 NOTE — Assessment & Plan Note (Signed)
No acute flare. Continue current management.

## 2017-10-12 NOTE — Patient Instructions (Signed)
Exercising to Lose Weight Exercising can help you to lose weight. In order to lose weight through exercise, you need to do vigorous-intensity exercise. You can tell that you are exercising with vigorous intensity if you are breathing very hard and fast and cannot hold a conversation while exercising. Moderate-intensity exercise helps to maintain your current weight. You can tell that you are exercising at a moderate level if you have a higher heart rate and faster breathing, but you are still able to hold a conversation. How often should I exercise? Choose an activity that you enjoy and set realistic goals. Your health care provider can help you to make an activity plan that works for you. Exercise regularly as directed by your health care provider. This may include:  Doing resistance training twice each week, such as: ? Push-ups. ? Sit-ups. ? Lifting weights. ? Using resistance bands.  Doing a given intensity of exercise for a given amount of time. Choose from these options: ? 150 minutes of moderate-intensity exercise every week. ? 75 minutes of vigorous-intensity exercise every week. ? A mix of moderate-intensity and vigorous-intensity exercise every week.  Children, pregnant women, people who are out of shape, people who are overweight, and older adults may need to consult a health care provider for individual recommendations. If you have any sort of medical condition, be sure to consult your health care provider before starting a new exercise program. What are some activities that can help me to lose weight?  Walking at a rate of at least 4.5 miles an hour.  Jogging or running at a rate of 5 miles per hour.  Biking at a rate of at least 10 miles per hour.  Lap swimming.  Roller-skating or in-line skating.  Cross-country skiing.  Vigorous competitive sports, such as football, basketball, and soccer.  Jumping rope.  Aerobic dancing. How can I be more active in my day-to-day  activities?  Use the stairs instead of the elevator.  Take a walk during your lunch break.  If you drive, park your car farther away from work or school.  If you take public transportation, get off one stop early and walk the rest of the way.  Make all of your phone calls while standing up and walking around.  Get up, stretch, and walk around every 30 minutes throughout the day. What guidelines should I follow while exercising?  Do not exercise so much that you hurt yourself, feel dizzy, or get very short of breath.  Consult your health care provider prior to starting a new exercise program.  Wear comfortable clothes and shoes with good support.  Drink plenty of water while you exercise to prevent dehydration or heat stroke. Body water is lost during exercise and must be replaced.  Work out until you breathe faster and your heart beats faster. This information is not intended to replace advice given to you by your health care provider. Make sure you discuss any questions you have with your health care provider. Document Released: 02/07/2010 Document Revised: 06/13/2015 Document Reviewed: 06/08/2013 Elsevier Interactive Patient Education  2018 [employer]vier Inc.  

## 2017-10-12 NOTE — Progress Notes (Signed)
  Subjective:     Patient ID: Barbara Clark, female   DOB: July 23, 1975, 42 y.o.   MRN: [id number]  HPI DM1: Compliant with her insulin pump as well as endocrinology f/u. This morning her home CBG was 69 and it typically ranges between 54- 326. Denies recent hypoglycemic episode. MDD: She is compliant with her medication and group therapy. She has group therapy appointment today. Overweight: She walks and goes to the Gym to exercise daily or every other day. She is now more careful with exercising since she got off her walking cane.  Current Outpatient Medications on File Prior to Visit  Medication Sig Dispense Refill  . aspirin EC 81 MG tablet Take 81 mg by mouth daily.    . benztropine (COGENTIN) 0.5 MG tablet Take 0.5 mg by mouth daily.  2  . desvenlafaxine (PRISTIQ) 50 MG 24 hr tablet Take 50 mg by mouth daily.    . Insulin Human (INSULIN PUMP) SOLN Inject into the skin. Patient uses novolog 1.6 units/hr in insulin pump.    Marland Kitchen lisinopril (PRINIVIL,ZESTRIL) 5 MG tablet Take 5 mg by mouth daily.  3  . TEGRETOL 200 MG tablet Take 200 mg by mouth 3 (three) times daily.  2  . diphenhydrAMINE (ALLERGY) 25 MG tablet Take 25 mg by mouth as needed for itching or allergies.    Marland Kitchen traMADol (ULTRAM) 50 MG tablet Take 1 tablet (50 mg total) by mouth every 12 (twelve) hours as needed. (Patient not taking: Reported on 10/12/2017) 60 tablet 2   No current facility-administered medications on file prior to visit.    Past Medical History:  Diagnosis Date  . Abnormality of gait 12/26/2012  . Anxiety   . Chronic leg pain 04/27/2014  . Depression   . Diabetes mellitus    Type I  . DKA (diabetic ketoacidosis) ([place]) 09/03/2017  . HSV-2 (herpes simplex virus 2) infection 2012   serology  . Limb weakness 08/04/2013  . Menorrhagia 04/27/2014  . Right leg weakness 02/20/2014  . Stuttering 02/02/2013   Vitals:   10/12/17 0836  BP: 100/60  Pulse: 93  Temp: 98 F (36.7 C)  TempSrc: Oral  SpO2: 98%  Weight: 204 lb  (92.5 kg)  Height: 5\' 7"  (1.702 m)      Review of Systems  Respiratory: Negative.   Cardiovascular: Negative.   Gastrointestinal: Negative.   Neurological: Negative.   Psychiatric/Behavioral: Negative for self-injury. The patient is not nervous/anxious.   All other systems reviewed and are negative.      Objective:   Physical Exam  Constitutional: She is oriented to person, place, and time. She appears well-developed. No distress.  Cardiovascular: Normal rate, regular rhythm and normal heart sounds.  No murmur heard. Pulmonary/Chest: Effort normal and breath sounds normal. No stridor. No respiratory distress. She has no wheezes.  Abdominal: Soft. Bowel sounds are normal. She exhibits no distension and no mass. There is no tenderness. There is no guarding.  Musculoskeletal: Normal range of motion. She exhibits no edema.  Neurological: She is alert and oriented to person, place, and time. No cranial nerve deficit.  Nursing note and vitals reviewed.    Office Visit from 10/12/2017 in [facility]  PHQ-9 Total Score  16        Assessment:     DM1 MDD Overweight    Plan:     Check problem list

## 2017-10-12 NOTE — Assessment & Plan Note (Addendum)
Body mass index is 31.95 kg/m. Interested in losing weight. Doing well on diet and exercise. FLP checked today. F/U in 6 months for reassessment. I recommended f/u with her ophthalmologist for eye exam.

## 2017-10-12 NOTE — Assessment & Plan Note (Signed)
Stable and compliant with management. Continue management with endocrinology. Currently on insulin pump. She will get her routine A1C screen with her endocrinologist since they manage her medication. I will take A1C check off her HM.

## 2017-10-13 ENCOUNTER — Telehealth: Payer: Self-pay | Admitting: Family Medicine

## 2017-10-13 LAB — LIPID PANEL
Chol/HDL Ratio: 3.7 ratio (ref 0.0–4.4)
Cholesterol, Total: 126 mg/dL (ref 100–199)
HDL: 34 mg/dL — ABNORMAL LOW
LDL Calculated: 80 mg/dL (ref 0–99)
Triglycerides: 60 mg/dL (ref 0–149)
VLDL Cholesterol Cal: 12 mg/dL (ref 5–40)

## 2017-10-13 NOTE — Telephone Encounter (Signed)
FLP result discussed with her. LDL of 80 (<100 >70) Discussed options of starting Statin vs delay since her 10 yrs ASCVD risk is 2.2% and she is 42 years old. She is currently working on diet and exercise for weight loss.  For now, we both agreed on lifestyle modification. Recheck FLP in 1 year. Continue baby ASA.

## 2017-10-14 ENCOUNTER — Ambulatory Visit: Payer: Medicaid Other | Admitting: Physical Therapy

## 2017-10-14 ENCOUNTER — Encounter: Payer: Self-pay | Admitting: Physical Therapy

## 2017-10-14 DIAGNOSIS — R296 Repeated falls: Secondary | ICD-10-CM

## 2017-10-14 DIAGNOSIS — R2689 Other abnormalities of gait and mobility: Secondary | ICD-10-CM

## 2017-10-14 DIAGNOSIS — M6281 Muscle weakness (generalized): Secondary | ICD-10-CM

## 2017-10-14 DIAGNOSIS — R262 Difficulty in walking, not elsewhere classified: Secondary | ICD-10-CM

## 2017-10-14 NOTE — Therapy (Signed)
[place], [place], [address] Phone: [phone]   Fax:  [phone]  Physical Therapy Treatment  Patient Details  Name: Barbara Clark MRN: [id number] Date of Birth: [birth year] Referring Provider (PT): Dr Caprice Renshaw    Encounter Date: 10/14/2017  PT End of Session - 10/14/17 1016    Visit Number  10    Number of Visits  12    Date for PT Re-Evaluation  10/29/17    Authorization Type  [employer]ad     PT Start Time  1015    PT Stop Time  1057    PT Time Calculation (min)  42 min    Activity Tolerance  Patient tolerated treatment well    Behavior During Therapy  [facility] for tasks assessed/performed       Past Medical History:  Diagnosis Date  . Abnormality of gait 12/26/2012  . Anxiety   . Chronic leg pain 04/27/2014  . Depression   . Diabetes mellitus    Type I  . DKA (diabetic ketoacidosis) ([place]) 09/03/2017  . HSV-2 (herpes simplex virus 2) infection 2012   serology  . Limb weakness 08/04/2013  . Menorrhagia 04/27/2014  . Right leg weakness 02/20/2014  . Stuttering 02/02/2013    Past Surgical History:  Procedure Laterality Date  . TUBAL LIGATION      There were no vitals filed for this visit.  Subjective Assessment - 10/14/17 1015    Subjective  Patient is walking better but she is having pain into her thigh. The pain is not severe but it is pretty constant.     Pertinent History  DM I;  axiety, depression     Limitations  Standing    How long can you stand comfortably?  20 min     How long can you walk comfortably?  can not walk more then a few steps without her cane before her legs swells up    Diagnostic tests  Nothing recent     Patient Stated Goals  to walk without her cane by 7/17     Currently in Pain?  No/denies                       [facility] Adult PT Treatment/Exercise - 10/14/17 0001      High Level Balance   High Level Balance Comments  NBOS eyes closed 3x30 sec hold;  tandem stance left back SBA; right back moderate assistance.       Lumbar Exercises: Supine   Clam Limitations  x20 with green band     Bridge  20 reps    Bridge Limitations  with green band iso     Straight Leg Raises Limitations  x10 2lb right x10 no weight right 2x10 left 2lb       Knee/Hip Exercises: Standing   Heel Raises  20 reps    Forward Step Up  15 reps;Hand Hold: 1;Step Height: 6"             PT Education - 10/14/17 1016    Education Details  reviewed gait technique;     Person(s) Educated  Patient    Methods  Explanation;Demonstration;Tactile cues;Verbal cues    Comprehension  Verbalized understanding;Returned demonstration;Verbal cues required;Tactile cues required       PT Short Term Goals - 10/01/17 1043      PT SHORT TERM GOAL #1   Title  Patient will increase right hip flexion strength to 4+/5  Baseline  4+/5 hip flexion right all other measurements 5/5       PT SHORT TERM GOAL #2   Title  Patient will improve tandem stance to good bilateral     Baseline  good on solid ground ; poor on air-ex     Time  3    Period  Weeks    Status  On-going      PT SHORT TERM GOAL #3   Title  Patient will ambualte 300' without cane with good balance     Baseline  walking without a cane but her balance is still off and she is slow.     Time  3    Period  Weeks    Status  On-going        PT Long Term Goals - 10/01/17 1128      PT LONG TERM GOAL #1   Title  Patient will be indepdnent with HEP that promotes blance, LE strength, and a proper gait pattern.     Baseline  continues to progress balance activity     Time  4    Period  Weeks    Status  Not Met      PT LONG TERM GOAL #2   Title  Patient will ambualte 3000' with LRAD with no LOB     Baseline  has posterior loss of balance at times wihtout her cane     Time  4    Period  Weeks    Status  Not Met      PT LONG TERM GOAL #3   Title  Patient will ambaulte 1000' with normal gait speed     Baseline   decreased gait speed for her age.     Time  4    Period  Weeks    Status  New            Plan - 10/14/17 2109    Clinical Impression Statement  Patients walking is improving. She is putting her leg out to the side less. She is having pain in her thigh but it seems like muscle soreness. She is walking more without her cane. She tolerated treatment well. Her balance is improving with balance activity on the air-ex. She did fatigue towards the end. Therapy added weight to her SLR and bands to her bridges. Therapy will continue to progresss patient as tolerated.     Clinical Presentation  Stable    Clinical Decision Making  Low    Rehab Potential  Good    PT Frequency  1x / week    PT Duration  4 weeks    PT Treatment/Interventions  ADLs/Self Care Home Management;Cryotherapy;Electrical Stimulation;Iontophoresis 59m/ml Dexamethasone;Ultrasound;Traction;Functional mobility training;Therapeutic activities;Therapeutic exercise;Stair training;Neuromuscular re-education;Patient/family education;Passive range of motion;Manual techniques;Splinting;Taping    PT Next Visit Plan  continue with balance exercises    PT Home Exercise Plan  bridges, SLR; clamshell red; standing march ,  scissors level 2, wall slides facing wall.     Consulted and Agree with Plan of Care  Patient       Patient will benefit from skilled therapeutic intervention in order to improve the following deficits and impairments:  Abnormal gait, Pain, Postural dysfunction, Decreased activity tolerance, Decreased endurance, Decreased range of motion, Decreased strength, Decreased balance  Visit Diagnosis: Other abnormalities of gait and mobility  Muscle weakness (generalized)  Difficulty in walking, not elsewhere classified  Repeated falls     Problem List Patient Active Problem List   Diagnosis  Date Noted  . Overweight 10/12/2017  . Back pain, lumbosacral 07/06/2017  . Psychologic conversion disorder, history of  11/06/2014  . DM neuropathy, type I diabetes mellitus ([place]) 03/13/2014  . TOBACCO USER 10/27/2008  . Diabetes mellitus type I ([place]) 03/18/2006  . Major depressive disorder, recurrent episode ([place]) 03/18/2006  . Migraine headache 03/18/2006    Carney Living PT DPT  10/14/2017, 9:20 PM  [facility] [address] [place], [place], [address] Phone: [phone]   Fax:  [phone]  Name: Barbara Clark MRN: [id number] Date of Birth: [birth year]

## 2017-10-19 ENCOUNTER — Ambulatory Visit: Payer: Medicaid Other | Attending: Family Medicine | Admitting: Physical Therapy

## 2017-10-19 ENCOUNTER — Encounter: Payer: Self-pay | Admitting: Physical Therapy

## 2017-10-19 DIAGNOSIS — R296 Repeated falls: Secondary | ICD-10-CM | POA: Diagnosis present

## 2017-10-19 DIAGNOSIS — M6281 Muscle weakness (generalized): Secondary | ICD-10-CM | POA: Diagnosis present

## 2017-10-19 DIAGNOSIS — R262 Difficulty in walking, not elsewhere classified: Secondary | ICD-10-CM

## 2017-10-19 DIAGNOSIS — R2689 Other abnormalities of gait and mobility: Secondary | ICD-10-CM

## 2017-10-19 NOTE — Therapy (Signed)
[place], [place], [address] Phone: [phone]   Fax:  [phone]  Physical Therapy Treatment  Patient Details  Name: Barbara Clark MRN: [id number] Date of Birth: 12/10/1975 Referring Provider (PT): Dr Caprice Renshaw    Encounter Date: 10/19/2017  PT End of Session - 10/19/17 1114    Visit Number  11    Number of Visits  12    Date for PT Re-Evaluation  10/29/17    Authorization Type  [employer]ici[employer]     Authorization Time Period  10/06/2017-11/02/2017    PT Start Time  0800    PT Stop Time  0830   Patient not appropirate for further treatment    PT Time Calculation (min)  30 min    Activity Tolerance  Patient tolerated treatment well    Behavior During Therapy  [facility] for tasks assessed/performed       Past Medical History:  Diagnosis Date  . Abnormality of gait 12/26/2012  . Anxiety   . Chronic leg pain 04/27/2014  . Depression   . Diabetes mellitus    Type I  . DKA (diabetic ketoacidosis) ([place]) 09/03/2017  . HSV-2 (herpes simplex virus 2) infection 2012   serology  . Limb weakness 08/04/2013  . Menorrhagia 04/27/2014  . Right leg weakness 02/20/2014  . Stuttering 02/02/2013    Past Surgical History:  Procedure Laterality Date  . TUBAL LIGATION      There were no vitals filed for this visit.  Subjective Assessment - 10/19/17 0804    Subjective  Patient presented with mild antaglic gait reporting that she over did it this weekend. Pt walked 3 miles without cane and rode an hour on stationary bike at gym. Reports R quad hurting a lot more than usual.     Pertinent History  DM I;  axiety, depression     Limitations  Standing    How long can you stand comfortably?  20 min     How long can you walk comfortably?  can not walk more then a few steps without her cane before her legs swells up    Diagnostic tests  Nothing recent     Patient Stated Goals  to walk without her cane by 7/17     Currently in Pain?   Yes    Pain Score  4     Pain Location  Leg    Pain Orientation  Right    Pain Descriptors / Indicators  Aching    Pain Type  Chronic pain    Pain Onset  More than a month ago    Pain Frequency  Constant    Aggravating Factors   standing and walking     Pain Relieving Factors  rest     Effect of Pain on Daily Activities  difficulty perfroming ADL's                        [facility] Adult PT Treatment/Exercise - 10/19/17 0001      Exercises   Exercises  Knee/Hip      Lumbar Exercises: Supine   Clam Limitations  2x10 red with pain     Other Supine Lumbar Exercises  quad set x10 with pain; ball squeeze 2x10       Knee/Hip Exercises: Stretches   Other Knee/Hip Stretches  attempted thomas stretch but had significant pain; tried side lying gentle quad stretch but had pain; lower trunk rotation;x10;  Knee/Hip Exercises: Supine   Short Arc Quad Sets Limitations  1x5; limited by pain    Heel Slides Limitations  1x10; limited by pain    Other Supine Knee/Hip Exercises  hip rotations in hooklying 1x10    Other Supine Knee/Hip Exercises  hip abduction in hooklying with red theraband; 1x10       Manual Therapy   Manual Therapy  Soft tissue mobilization    Soft tissue mobilization  Attempted to roll quad and ITband but halted because of pain              PT Education - 10/19/17 1043    Education Details  reviewed symptom mamnagement; improtance of rest, delayed onset muscle soreness    Person(s) Educated  Patient    Methods  Explanation    Comprehension  Verbalized understanding;Returned demonstration;Verbal cues required;Tactile cues required       PT Short Term Goals - 10/01/17 1043      PT SHORT TERM GOAL #1   Title  Patient will increase right hip flexion strength to 4+/5    Baseline  4+/5 hip flexion right all other measurements 5/5       PT SHORT TERM GOAL #2   Title  Patient will improve tandem stance to good bilateral     Baseline  good on solid  ground ; poor on air-ex     Time  3    Period  Weeks    Status  On-going      PT SHORT TERM GOAL #3   Title  Patient will ambualte 300' without cane with good balance     Baseline  walking without a cane but her balance is still off and she is slow.     Time  3    Period  Weeks    Status  On-going        PT Long Term Goals - 10/01/17 1128      PT LONG TERM GOAL #1   Title  Patient will be indepdnent with HEP that promotes blance, LE strength, and a proper gait pattern.     Baseline  continues to progress balance activity     Time  4    Period  Weeks    Status  Not Met      PT LONG TERM GOAL #2   Title  Patient will ambualte 3000' with LRAD with no LOB     Baseline  has posterior loss of balance at times wihtout her cane     Time  4    Period  Weeks    Status  Not Met      PT LONG TERM GOAL #3   Title  Patient will ambaulte 1000' with normal gait speed     Baseline  decreased gait speed for her age.     Time  4    Period  Weeks    Status  New            Plan - 10/19/17 1115    Clinical Impression Statement  Patient was limited today. She had increased pain with basic movements; stretches, and manual therapy. Therapy perfromed a limited session. she had palpable inflammation in her leg. Her symptoms were similar to what she described prior to her pump. She was strongly advised that if her pain continues not to just assume it is because she walked oo far on [place] and that she should contact her MD. She is also having systemic fatigue. She  reports her sugar levels have been good.     Clinical Presentation  Stable    Clinical Decision Making  Low    Rehab Potential  Good    PT Frequency  1x / week    PT Duration  4 weeks    PT Treatment/Interventions  ADLs/Self Care Home Management;Cryotherapy;Electrical Stimulation;Iontophoresis 65m/ml Dexamethasone;Ultrasound;Traction;Functional mobility training;Therapeutic activities;Therapeutic exercise;Stair  training;Neuromuscular re-education;Patient/family education;Passive range of motion;Manual techniques;Splinting;Taping    PT Next Visit Plan  continue with balance exercises    PT Home Exercise Plan  bridges, SLR; clamshell red; standing march ,  scissors level 2, wall slides facing wall.     Consulted and Agree with Plan of Care  Patient       Patient will benefit from skilled therapeutic intervention in order to improve the following deficits and impairments:  Abnormal gait, Pain, Postural dysfunction, Decreased activity tolerance, Decreased endurance, Decreased range of motion, Decreased strength, Decreased balance  Visit Diagnosis: Other abnormalities of gait and mobility  Muscle weakness (generalized)  Difficulty in walking, not elsewhere classified  Repeated falls     Problem List Patient Active Problem List   Diagnosis Date Noted  . Overweight 10/12/2017  . Back pain, lumbosacral 07/06/2017  . 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 DM neuropathy, type I diabetes mellitus ([place] 03/13/2014  . TOBACCO USER 10/27/2008  . Diabetes mellitus type I ([place] 03/18/2006  . Major depressive disorder, recurrent episode ([place] 03/18/2006  . Migraine headache 03/18/2006    DCarney LivingPT DPT  10/19/2017, 11:22 AM  [facility] [address]Phone: [phone]  Fax:  [phone] Name: Barbara NilsonMRN: [id number] of Birth: [birth year]

## 2017-10-26 ENCOUNTER — Ambulatory Visit: Payer: Medicaid Other | Admitting: Physical Therapy

## 2017-10-26 ENCOUNTER — Encounter: Payer: Self-pay | Admitting: Physical Therapy

## 2017-10-26 DIAGNOSIS — R2689 Other abnormalities of gait and mobility: Secondary | ICD-10-CM | POA: Diagnosis not present

## 2017-10-26 DIAGNOSIS — R296 Repeated falls: Secondary | ICD-10-CM

## 2017-10-26 DIAGNOSIS — R262 Difficulty in walking, not elsewhere classified: Secondary | ICD-10-CM

## 2017-10-26 DIAGNOSIS — M6281 Muscle weakness (generalized): Secondary | ICD-10-CM

## 2017-10-26 NOTE — Therapy (Addendum)
[place], [place], [address] Phone: [phone]   Fax:  [phone]  Physical Therapy Treatment  Patient Details  Name: Barbara Clark MRN: [id number] Date of Birth: 06-Jun-1975 Referring Provider (PT): Dr Caprice Renshaw    Encounter Date: 10/26/2017  PT End of Session - 10/26/17 0807    Visit Number  12    Number of Visits  12    Date for PT Re-Evaluation  11/29/17    Authorization Type  Mediciad     Authorization Time Period  10/06/2017-11/02/2017    PT Start Time  0800    PT Stop Time  0842    PT Time Calculation (min)  42 min    Activity Tolerance  Patient tolerated treatment well    Behavior During Therapy  [facility] for tasks assessed/performed       Past Medical History:  Diagnosis Date  . Abnormality of gait 12/26/2012  . Anxiety   . Chronic leg pain 04/27/2014  . Depression   . Diabetes mellitus    Type I  . DKA (diabetic ketoacidosis) ([place] [facility] [place]) 09/03/2017  . HSV-2 (herpes simplex virus 2) infection 2012   serology  . Limb weakness 08/04/2013  . Menorrhagia 04/27/2014  . Right leg weakness 02/20/2014  . Stuttering 02/02/2013    Past Surgical History:  Procedure Laterality Date  . TUBAL LIGATION      There were no vitals filed for this visit.  Subjective Assessment - 10/26/17 0802    Subjective  Mild pain down right leg but main c/o is pain in right low back believed to be due to house work over the past few days. Pt had swelling over the weekend in RLE but swelling had decreased when she woke up the next day. Pt is ambulating without antalgic gait at time of session.  Walking 2 mile a day currently.     Pertinent History  DM I;  axiety, depression     Limitations  Standing    How long can you stand comfortably?  20 min     How long can you walk comfortably?  can not walk more then a few steps without her cane before her legs swells up    Diagnostic tests  Nothing recent     Patient Stated Goals  to  walk without her cane by 7/17     Currently in Pain?  Yes    Pain Score  4     Pain Location  Back    Pain Orientation  Lower;Right    Pain Descriptors / Indicators  Aching    Pain Type  Chronic pain    Pain Onset  More than a month ago    Pain Frequency  Constant    Aggravating Factors   standing and walking     Pain Relieving Factors  rest    Effect of Pain on Daily Activities  difficulty performing ADL's          [facility] PT Assessment - 10/26/17 1136      Strength   Right Hip Flexion  4+/5    Right Hip ADduction  4+/5                   [facility] Adult PT Treatment/Exercise - 10/26/17 1136      High Level Balance   High Level Balance Comments  NBOS eyes closed 3x30 sec hold; tandem stance left back SBA; right back moderate assistance.  Lumbar Exercises: Standing   Heel Raises  20 reps    Other Standing Lumbar Exercises  standing hip extension 2x10; abduction 2x10 each leg ; hip flexion 2x10    Other Standing Lumbar Exercises  standing marches 2x10      Lumbar Exercises: Supine   Clam Limitations  2x10 blue    Other Supine Lumbar Exercises  ball squeeze 2x10; hip marches with TrA stabilization 2x20      Knee/Hip Exercises: Stretches   Other Knee/Hip Stretches  trunk rotations 2x20; piriformis stretched 2x20sec; single knee to chest 2x20 sec;      Knee/Hip Exercises: Aerobic   Nustep  L5 UE/LE x 5 minutes              PT Education - 10/26/17 1307    Education Details  reviewed POC going forward     Person(s) Educated  Patient    Methods  Explanation    Comprehension  Verbalized understanding;Returned demonstration;Verbal cues required;Tactile cues required       PT Short Term Goals - 10/26/17 1311      PT SHORT TERM GOAL #1   Title  Patient will increase right hip flexion strength to 4+/5    Baseline  4+/5 hip flexion right all other measurements 5/5     Time  3    Period  Weeks    Status  Achieved      PT SHORT TERM GOAL #2   Title   Patient will improve tandem stance to good bilateral     Baseline  good on solid ground     Time  3    Period  Weeks    Status  Achieved      PT SHORT TERM GOAL #3   Title  Patient will ambualte 300' without cane with good balance     Baseline  walking without a cane with improvedbalance and gait but still antalgic    Status  Partially Met        PT Long Term Goals - 10/26/17 1312      PT LONG TERM GOAL #1   Title  Patient will be indepdnent with HEP that promotes blance, LE strength, and a proper gait pattern.     Baseline  continues to work on balance     Time  4    Period  Weeks    Status  On-going      PT LONG TERM GOAL #2   Title  Patient will ambualte 3000' with LRAD with no LOB     Baseline  has posterior loss of balance at times wihtout her cane     Time  4    Period  Weeks    Status  On-going      PT LONG TERM GOAL #3   Title  Patient will ambaulte 1000' with normal gait speed     Baseline  decreased gait speed for her age.     Time  4    Period  Weeks    Status  On-going            Plan - 10/26/17 6546    Clinical Impression Statement  The patient had a set back over the last two weeks where she execisesed too hard on her own and had singificant pain in her leg. Therapy had been working on an endurance program but had toscale back for 2 weeks to focus on stretching and light strengthening. She is nearing all goals. See goals for  goal specific progress. she would benefit from 3 more visits to wrap up her enduranc program and continue to work on [employer] . he gait pattern has improved significantly even with her setback. Continue to work on balance as well. Her strength remained at 4+/5 bilateral.     Clinical Presentation  Stable    Clinical Decision Making  Low    Rehab Potential  Good    PT Frequency  1x / week    PT Duration  3 weeks    PT Treatment/Interventions  ADLs/Self Care Home Management;Cryotherapy;Electrical Stimulation;Iontophoresis 19m/ml  Dexamethasone;Ultrasound;Traction;Functional mobility training;Therapeutic activities;Therapeutic exercise;Stair training;Neuromuscular re-education;Patient/family education;Passive range of motion;Manual techniques;Splinting;Taping    PT Next Visit Plan  continue to progress balance and ther-ex     PT Home Exercise Plan  bridges, SLR; clamshell red; standing march ,  scissors level 2, wall slides facing wall.     Consulted and Agree with Plan of Care  Patient       Patient will benefit from skilled therapeutic intervention in order to improve the following deficits and impairments:  Abnormal gait, Pain, Postural dysfunction, Decreased activity tolerance, Decreased endurance, Decreased range of motion, Decreased strength, Decreased balance  Visit Diagnosis: Other abnormalities of gait and mobility  Muscle weakness (generalized)  Difficulty in walking, not elsewhere classified  Repeated falls     Problem List Patient Active Problem List   Diagnosis Date Noted  . Overweight 10/12/2017  . Back pain, lumbosacral 07/06/2017  . 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 DM neuropathy, type I diabetes mellitus ([place] 03/13/2014  . TOBACCO USER 10/27/2008  . Diabetes mellitus type I ([place] 03/18/2006  . Major depressive disorder, recurrent episode ([place] 03/18/2006  . Migraine headache 03/18/2006    DCarney LivingPT DPT  10/26/2017, 1:16 PM  [place] [address]Phone: [phone]  Fax:  [phone] Name: LJenita RayfieldMRN: [id number] of Birth: 7June 27, 1977

## 2017-11-01 NOTE — Addendum Note (Signed)
Addended by: Carney Living on: 11/01/2017 02:17 PM   Modules accepted: Orders

## 2017-11-02 ENCOUNTER — Encounter: Payer: Self-pay | Admitting: Physical Therapy

## 2017-11-02 ENCOUNTER — Ambulatory Visit: Payer: Medicaid Other | Admitting: Physical Therapy

## 2017-11-02 DIAGNOSIS — M6281 Muscle weakness (generalized): Secondary | ICD-10-CM

## 2017-11-02 DIAGNOSIS — R262 Difficulty in walking, not elsewhere classified: Secondary | ICD-10-CM

## 2017-11-02 DIAGNOSIS — R2689 Other abnormalities of gait and mobility: Secondary | ICD-10-CM

## 2017-11-02 DIAGNOSIS — R296 Repeated falls: Secondary | ICD-10-CM

## 2017-11-02 NOTE — Therapy (Signed)
[place], [place], [address] Phone: [phone]   Fax:  [phone]  Physical Therapy Treatment  Patient Details  Name: Barbara Clark MRN: [id number] Date of Birth: [birth year] Referring Provider (PT): Dr Caprice Renshaw    Encounter Date: 11/02/2017  PT End of Session - 11/02/17 0937    Visit Number  --   no charge   Number of Visits  15    Date for PT Re-Evaluation  11/29/17    Authorization Type  [employer]     Authorization Time Period  10/17-11/06/2017    Authorization - Visit Number  --   not covered    PT Start Time  0930    PT Stop Time  1010    PT Time Calculation (min)  40 min       Past Medical History:  Diagnosis Date  . Abnormality of gait 12/26/2012  . Anxiety   . Chronic leg pain 04/27/2014  . Depression   . Diabetes mellitus    Type I  . DKA (diabetic ketoacidosis) ([place]) 09/03/2017  . HSV-2 (herpes simplex virus 2) infection 2012   serology  . Limb weakness 08/04/2013  . Menorrhagia 04/27/2014  . Right leg weakness 02/20/2014  . Stuttering 02/02/2013    Past Surgical History:  Procedure Laterality Date  . TUBAL LIGATION      There were no vitals filed for this visit.  Subjective Assessment - 11/02/17 0934    Subjective  Up to 4 miles walking a day. I feel pain during walk and more after I sit in car after walking.                        [facility] Adult PT Treatment/Exercise - 11/02/17 0001      Ambulation/Gait   Pre-Gait Activities  weight shifting forward working on maintaining level pelvis.     Gait Comments  Gait with hands on hips to decrease hip drop- able to improve with cues       Lumbar Exercises: Standing   Other Standing Lumbar Exercises  standing hp hikes x 10 each side       Lumbar Exercises: Supine   Pelvic Tilt  20 reps    Clam Limitations  2 x 10 green and with bridge x 10    Bridge  20 reps      Knee/Hip Exercises: Standing   Other Standing  Knee Exercises  squat x 5 with pelvic tilits, standing pelvic tilits, standing side bend stretch                PT Short Term Goals - 10/26/17 1311      PT SHORT TERM GOAL #1   Title  Patient will increase right hip flexion strength to 4+/5    Baseline  4+/5 hip flexion right all other measurements 5/5     Time  3    Period  Weeks    Status  Achieved      PT SHORT TERM GOAL #2   Title  Patient will improve tandem stance to good bilateral     Baseline  good on solid ground     Time  3    Period  Weeks    Status  Achieved      PT SHORT TERM GOAL #3   Title  Patient will ambualte 300' without cane with good balance     Baseline  walking without a cane with improvedbalance  and gait but still antalgic    Status  Partially Met        PT Long Term Goals - 10/26/17 1312      PT LONG TERM GOAL #1   Title  Patient will be indepdnent with HEP that promotes blance, LE strength, and a proper gait pattern.     Baseline  continues to work on balance     Time  4    Period  Weeks    Status  On-going      PT LONG TERM GOAL #2   Title  Patient will ambualte 3000' with LRAD with no LOB     Baseline  has posterior loss of balance at times wihtout her cane     Time  4    Period  Weeks    Status  On-going      PT LONG TERM GOAL #3   Title  Patient will ambaulte 1000' with normal gait speed     Baseline  decreased gait speed for her age.     Time  4    Period  Weeks    Status  On-going            Plan - 11/02/17 1326    Clinical Impression Statement  Pt arrives reporting she is waking 4 miles per day. She notes increased pain afterward when she sits in her car and has to wait 10 minutes before she can drive. She reports the pain is mid low back to right side and bilateral thighs. While walking she feels thighs burning. Instructed her in standing side bend, squats and pelvic tilts to try prior to getting into car. Focused gait training while maintaining level pelvis. She  declined modalities.     PT Next Visit Plan  continue to progress balance and ther-ex     PT Home Exercise Plan  bridges, SLR; clamshell red; standing march ,  scissors level 2, wall slides facing wall.        Patient will benefit from skilled therapeutic intervention in order to improve the following deficits and impairments:  Abnormal gait, Pain, Postural dysfunction, Decreased activity tolerance, Decreased endurance, Decreased range of motion, Decreased strength, Decreased balance  Visit Diagnosis: Other abnormalities of gait and mobility  Muscle weakness (generalized)  Difficulty in walking, not elsewhere classified  Repeated falls     Problem List Patient Active Problem List   Diagnosis Date Noted  . Overweight 10/12/2017  . Back pain, lumbosacral 07/06/2017  . 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 DM neuropathy, type I diabetes mellitus ([place]) 03/13/2014  . TOBACCO USER 10/27/2008  . Diabetes mellitus type I ([place]) 03/18/2006  . Major depressive disorder, recurrent episode ([place]) 03/18/2006  . Migraine headache 03/18/2006    Dorene Ar, PTA 11/02/2017, 1:30 PM  [place], [place], [address] Phone: [phone]   Fax:  [phone]  Name: Barbara Clark MRN: [id number] Date of Birth: 29-Aug-1975

## 2017-11-04 ENCOUNTER — Telehealth: Payer: Self-pay | Admitting: Family Medicine

## 2017-11-04 NOTE — Telephone Encounter (Signed)
Pt would like for Dr. Gwendlyn Deutscher to call her at [phone]. She has some questions concerning some symptoms she is having.

## 2017-11-05 NOTE — Telephone Encounter (Signed)
Spoke with patient and she has been experiencing dark urine with a strong, fishy odor.  She states that she thinks she is drinking enough water but is still concerned.  Appt made for Monday to have this evaluated.  Jazmin Hartsell,CMA

## 2017-11-08 ENCOUNTER — Other Ambulatory Visit: Payer: Self-pay

## 2017-11-08 ENCOUNTER — Ambulatory Visit (INDEPENDENT_AMBULATORY_CARE_PROVIDER_SITE_OTHER): Payer: Medicaid Other | Admitting: Family Medicine

## 2017-11-08 VITALS — BP 116/70 | HR 83 | Temp 98.6°F | Wt 208.0 lb

## 2017-11-08 DIAGNOSIS — N390 Urinary tract infection, site not specified: Secondary | ICD-10-CM | POA: Diagnosis not present

## 2017-11-08 DIAGNOSIS — R8290 Unspecified abnormal findings in urine: Secondary | ICD-10-CM

## 2017-11-08 LAB — POCT UA - MICROSCOPIC ONLY: Epithelial cells, urine per micros: >20

## 2017-11-08 LAB — POCT URINALYSIS DIP (MANUAL ENTRY)
Bilirubin, UA: NEGATIVE
Glucose, UA: 500 mg/dL — AB
Leukocytes, UA: NEGATIVE
Nitrite, UA: POSITIVE — AB
Protein Ur, POC: NEGATIVE mg/dL
Spec Grav, UA: 1.025 (ref 1.010–1.025)
Urobilinogen, UA: 1 E.U./dL
pH, UA: 7 (ref 5.0–8.0)

## 2017-11-08 MED ORDER — CEPHALEXIN 500 MG PO CAPS: 500.0000 mg | ORAL_CAPSULE | Freq: Three times a day (TID) | ORAL | 0 refills | Status: AC

## 2017-11-08 NOTE — Progress Notes (Signed)
SUBJECTIVE: Barbara Clark is a 42 y.o. female who complains of urinary urgency, dark and malodorous urine but denies any dysuria. Symptoms have been going on for the past 7 days, without flank pain, fever, chills, or abnormal vaginal discharge or bleeding.   OBJECTIVE: Appears well, in no apparent distress.  Vital signs are normal. The abdomen is soft without tenderness, guarding, mass, rebound or organomegaly. No CVA tenderness or inguinal adenopathy noted. Urine dipstick shows positive for nitrites.  Micro exam: many+ bacteria, trace RBCs.   ASSESSMENT: UTI uncomplicated without evidence of pyelonephritis  PLAN:  --Keflex 500 mg tid for 7 days --Follow up as needed

## 2017-11-08 NOTE — Patient Instructions (Signed)
Acute Urinary Retention, Female Urinary retention means you are unable to pee completely or at all (empty your bladder). Follow these instructions at home:  Drink enough fluids to keep your pee (urine) clear or pale yellow.  If you are sent home with a tube that drains the bladder (catheter), there will be a drainage bag attached to it. There are two types of bags. One is big that you can wear at night without having to empty it. One is smaller and needs to be emptied more often.  Keep the drainage bag emptied.  Keep the drainage bag lower than the tube.  Only take medicine as told by your doctor. Contact a doctor if:  You have a low-grade fever.  You have spasms or you are leaking pee when you have spasms. Get help right away if:  You have chills or a fever.  Your catheter stops draining pee.  Your catheter falls out.  You have increased bleeding that does not stop after you have rested and increased the amount of fluids you had been drinking. This information is not intended to replace advice given to you by your health care provider. Make sure you discuss any questions you have with your health care provider. Document Released: 06/24/2007 Document Revised: 06/13/2015 Document Reviewed: 06/16/2012 Elsevier Interactive Patient Education  2017 [employer].  

## 2017-11-10 LAB — URINE CULTURE

## 2017-11-18 ENCOUNTER — Encounter: Payer: Self-pay | Admitting: Physical Therapy

## 2017-11-18 ENCOUNTER — Ambulatory Visit: Payer: Medicaid Other | Admitting: Physical Therapy

## 2017-11-18 DIAGNOSIS — M6281 Muscle weakness (generalized): Secondary | ICD-10-CM

## 2017-11-18 DIAGNOSIS — R2689 Other abnormalities of gait and mobility: Secondary | ICD-10-CM | POA: Diagnosis not present

## 2017-11-18 DIAGNOSIS — R262 Difficulty in walking, not elsewhere classified: Secondary | ICD-10-CM

## 2017-11-18 NOTE — Patient Instructions (Signed)
Hip Flexor Stretch on Step REPS: 10 SETS: 3 DAILY: 1 WEEKLY: 7   Setup Begin in a standing upright position with a step or platform in front of you. Movement Place your foot on the step and push your hips forward with your pelvis tucked, until you feel a stretch in the front of your hip. Hold briefly, then carefully return to the starting position and repeat. Tip Make sure to keep you pelvis tucked and do not arch your low back during the exercise.  Hip Flexion REPS: 10 SETS: 3 DAILY: 1 WEEKLY: 7   Setup Begin lying on your back with your legs straight and your head propped up to a comfortable position. Place a pillow under your leg for support if needed. Movement Bend one leg, bringing your knee toward your chest. Then slowly return to the starting position and repeat. Tip Make sure to keep your back flat against the floor or bed during the exercise, and do not bend your knee past your hip. Supine Bridge REPS: 10 SETS: 3 DAILY: 1 WEEKLY: 7   Setup Begin lying on your back with your arms resting at your sides, your legs bent at the knees and your feet flat on the ground. Movement Tighten your abdominals and slowly lift your hips off the floor into a bridge position, keeping your back straight. Tip Make sure to keep your trunk stiff throughout the exercise and your arms flat on the floor.

## 2017-11-18 NOTE — Therapy (Addendum)
[place], [place], [address] Phone: [phone]   Fax:  [phone]  Physical Therapy Treatment  Patient Details  Name: Barbara Clark MRN: [id number] Date of Birth: 1976/01/12 Referring Provider (PT): Dr Caprice Renshaw    Encounter Date: 11/18/2017  PT End of Session - 11/18/17 0942    Visit Number  16    Number of Visits  18    Date for PT Re-Evaluation  11/29/17    Authorization Type  [employer]     Authorization Time Period  10/17-11/06/2017    Authorization - Visit Number  1    Authorization - Number of Visits  3    PT Start Time  0930    PT Stop Time  1015    PT Time Calculation (min)  45 min    Activity Tolerance  Patient tolerated treatment well    Behavior During Therapy  [facility] for tasks assessed/performed       Past Medical History:  Diagnosis Date  . Abnormality of gait 12/26/2012  . Anxiety   . Chronic leg pain 04/27/2014  . Depression   . Diabetes mellitus    Type I  . DKA (diabetic ketoacidosis) ([place]) 09/03/2017  . HSV-2 (herpes simplex virus 2) infection 2012   serology  . Limb weakness 08/04/2013  . Menorrhagia 04/27/2014  . Right leg weakness 02/20/2014  . Stuttering 02/02/2013    Past Surgical History:  Procedure Laterality Date  . TUBAL LIGATION      There were no vitals filed for this visit.  Subjective Assessment - 11/18/17 1315    Subjective  Woke up in alot of leg pain a few weeks ago. Not sure what caused it but had a bad limp as a result and had to use cane to walk. Has not been to the gym for 2 weeks because of pain. Over the past few days pain has decreased and not having to use cane to walk.     Pertinent History  DM I;  axiety, depression     Limitations  Standing    How long can you stand comfortably?  20 min     How long can you walk comfortably?  able to walk without cane now     Diagnostic tests  Nothing recent     Patient Stated Goals  to walk without her cane by  7/17     Currently in Pain?  Yes    Pain Score  3     Pain Location  Back    Pain Orientation  Right;Lower    Pain Descriptors / Indicators  Aching    Pain Type  Chronic pain    Pain Onset  More than a month ago    Pain Frequency  Intermittent    Aggravating Factors   standing and walking     Pain Relieving Factors  rest    Effect of Pain on Daily Activities  difficluty performing ADLs                       [facility] Adult PT Treatment/Exercise - 11/18/17 0001      Lumbar Exercises: Aerobic   Stationary Bike  5 minutes       Lumbar Exercises: Standing   Other Standing Lumbar Exercises  standing hp hikes x 10 each side ; step ups x10 ; mini squats with cues for form 2x10;  hip extension Bil 1x10 Hip abduction 1x10 Bil  Lumbar Exercises: Supine   Clam Limitations  1x10 green    Bridge  20 reps    Bridge Limitations  with greet tband around knee    Straight Leg Raises Limitations  x10    Other Supine Lumbar Exercises  marches with green t-band 2x10      Knee/Hip Exercises: Stretches   Other Knee/Hip Stretches  piriformis stretch 2x20; thomas test stretch 2x20sec; standing hip flexor stretch on step 1x20      Manual Therapy   Manual Therapy  Soft tissue mobilization    Manual therapy comments  soft tissue mobilization to lateral quads     Soft tissue mobilization  Roll quad and IT band with foam roller              PT Education - 11/18/17 1323    Education Details  Reviewed POC for next visit; additional HEP    Person(s) Educated  Patient    Methods  Explanation;Demonstration    Comprehension  Verbalized understanding;Returned demonstration       PT Short Term Goals - 10/26/17 1311      PT SHORT TERM GOAL #1   Title  Patient will increase right hip flexion strength to 4+/5    Baseline  4+/5 hip flexion right all other measurements 5/5     Time  3    Period  Weeks    Status  Achieved      PT SHORT TERM GOAL #2   Title  Patient will improve  tandem stance to good bilateral     Baseline  good on solid ground     Time  3    Period  Weeks    Status  Achieved      PT SHORT TERM GOAL #3   Title  Patient will ambualte 300' without cane with good balance     Baseline  walking without a cane with improvedbalance and gait but still antalgic    Status  Partially Met        PT Long Term Goals - 10/26/17 1312      PT LONG TERM GOAL #1   Title  Patient will be indepdnent with HEP that promotes blance, LE strength, and a proper gait pattern.     Baseline  continues to work on balance     Time  4    Period  Weeks    Status  On-going      PT LONG TERM GOAL #2   Title  Patient will ambualte 3000' with LRAD with no LOB     Baseline  has posterior loss of balance at times wihtout her cane     Time  4    Period  Weeks    Status  On-going      PT LONG TERM GOAL #3   Title  Patient will ambaulte 1000' with normal gait speed     Baseline  decreased gait speed for her age.     Time  4    Period  Weeks    Status  On-going            Plan - 11/18/17 1324    Clinical Impression Statement  Pt reported minor set back over past few weeks, requiring use of cane to ambulate. Pain improved at time of session with therapy focusing on lumbar and hip strengthening. Pt had minor pain with hip hikes which ceased with mod queing.  Incorporated hip flexor stretch on 6" step as pt noticed  increased ant hipand quad tightness with exercises.     History and Personal Factors relevant to plan of care:  DMI; peripheral neuropathy, anxiety     Clinical Presentation  Stable    Clinical Decision Making  Low    Rehab Potential  Good    PT Frequency  1x / week    PT Duration  3 weeks    PT Treatment/Interventions  ADLs/Self Care Home Management;Cryotherapy;Electrical Stimulation;Iontophoresis 60m/ml Dexamethasone;Ultrasound;Traction;Functional mobility training;Therapeutic activities;Therapeutic exercise;Stair training;Neuromuscular  re-education;Patient/family education;Passive range of motion;Manual techniques;Splinting;Taping    PT Next Visit Plan  continue to progress balance     PT Home Exercise Plan  bridges, SLR; clamshell red; standing march ,  scissors level 2, wall slides facing wall.     Consulted and Agree with Plan of Care  Patient       Patient will benefit from skilled therapeutic intervention in order to improve the following deficits and impairments:  Abnormal gait, Pain, Postural dysfunction, Decreased activity tolerance, Decreased endurance, Decreased range of motion, Decreased strength, Decreased balance  Visit Diagnosis: Other abnormalities of gait and mobility  Muscle weakness (generalized)  Difficulty in walking, not elsewhere classified     Problem List Patient Active Problem List   Diagnosis Date Noted  . Overweight 10/12/2017  . Back pain, lumbosacral 07/06/2017  . 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 DM neuropathy, [place] I diabetes [place] ([place] 03/13/2014  . TOBACCO USER 10/27/2008  . Diabetes [place] I ([place] 03/18/2006  . Major depressive disorder, recurrent episode ([place] 03/18/2006  . Migraine headache 03/18/2006   DCarolyne LittlesPT DPT  11/18/2017  KEinar CrowSPT 11/18/2017, 1:42 PM   During this treatment session, the therapist was present, participating in and directing the treatment.   [place] [address]Phone: [phone]  Fax:  [phone] Name: Barbara HuardMRN: [id number] of Birth: [birth year]

## 2017-11-23 ENCOUNTER — Encounter: Payer: Self-pay | Admitting: Physical Therapy

## 2017-11-23 ENCOUNTER — Ambulatory Visit: Payer: Medicaid Other | Attending: Family Medicine | Admitting: Physical Therapy

## 2017-11-23 DIAGNOSIS — M6281 Muscle weakness (generalized): Secondary | ICD-10-CM | POA: Insufficient documentation

## 2017-11-23 DIAGNOSIS — R2689 Other abnormalities of gait and mobility: Secondary | ICD-10-CM | POA: Diagnosis present

## 2017-11-23 DIAGNOSIS — R262 Difficulty in walking, not elsewhere classified: Secondary | ICD-10-CM | POA: Diagnosis present

## 2017-11-23 NOTE — Therapy (Signed)
[place], [place], [address] Phone: [phone]   Fax:  [phone]  Physical Therapy Treatment/Discharge  Patient Details  Name: Barbara Clark MRN: [id number] Date of Birth: 04-26-1975 Referring Provider (PT): Dr Caprice Renshaw    Encounter Date: 11/23/2017  PT End of Session - 11/23/17 0849    Visit Number  17    Number of Visits  18    Date for PT Re-Evaluation  11/29/17    Authorization Type  [employer]     Authorization Time Period  10/17-11/06/2017    Authorization - Visit Number  2    Authorization - Number of Visits  3    PT Start Time  0800    PT Stop Time  0845    PT Time Calculation (min)  45 min    Equipment Utilized During Treatment  Gait belt    Activity Tolerance  Patient tolerated treatment well    Behavior During Therapy  [facility] for tasks assessed/performed       Past Medical History:  Diagnosis Date  . Abnormality of gait 12/26/2012  . Anxiety   . Chronic leg pain 04/27/2014  . Depression   . Diabetes mellitus    Type I  . DKA (diabetic ketoacidosis) ([place]) 09/03/2017  . HSV-2 (herpes simplex virus 2) infection 2012   serology  . Limb weakness 08/04/2013  . Menorrhagia 04/27/2014  . Right leg weakness 02/20/2014  . Stuttering 02/02/2013    Past Surgical History:  Procedure Laterality Date  . TUBAL LIGATION      There were no vitals filed for this visit.  Subjective Assessment - 11/23/17 0802    Subjective  Patient started back walking 3 miles a day since [place] and has had back pain ever since began walking program again. Believes it is because she had to take the 2 weeks off due to pain.     Pertinent History  DM I;  axiety, depression     Limitations  Standing    How long can you stand comfortably?  20 min     How long can you walk comfortably?  able to walk without cane now     Diagnostic tests  Nothing recent     Patient Stated Goals  to walk without her cane by 7/17     Currently in Pain?  Yes    Pain Score  4    Took pain medication at 6   Pain Location  Back    Pain Orientation  Right    Pain Descriptors / Indicators  Aching    Pain Type  Chronic pain    Pain Onset  More than a month ago    Pain Frequency  Intermittent    Aggravating Factors   standing and walking     Pain Relieving Factors  rest     Effect of Pain on Daily Activities  difficulty performing ADLs         [facility] PT Assessment - 11/23/17 0001      Strength   Right Hip Flexion  5/5    Right Hip ADduction  5/5                   [facility] Adult PT Treatment/Exercise - 11/23/17 0001      Neuro Re-ed    Neuro Re-ed Details   Tandem progression with EC EO on level surface and air max with increased sway on air max but no LOB; SLS on  level surface able to hold for 10 sec on left 5-8 sec on RLE; narrow base of support on air ex pad       Lumbar Exercises: Aerobic   Stationary Bike  5 minutes       Lumbar Exercises: Supine   Clam Limitations  1x10 green t-band    Bridge  10 reps    Bridge Limitations  with blue tband around knee 1x5 ; marching 1x5     Straight Leg Raises Limitations  x10   mod cues for Transvese abd engagement    Other Supine Lumbar Exercises  --      Manual Therapy   Manual Therapy  Soft tissue mobilization    Manual therapy comments  soft tissue to posterior gluts and paraspinals              PT Education - 11/23/17 0848    Education Details  Final HEP; tennis ball trigger point release on low back ; symptom manangement with final HEP and walking program    Person(s) Educated  Patient    Methods  Explanation;Demonstration;Tactile cues;Verbal cues;Handout    Comprehension  Verbalized understanding;Returned demonstration       PT Short Term Goals - 11/23/17 1224      PT SHORT TERM GOAL #1   Title  Patient will increase right hip flexion strength to 4+/5    Baseline  Gross 5/5 hip measurements     Time  3    Status  Achieved      PT  SHORT TERM GOAL #2   Title  Patient will improve tandem stance to good bilateral     Status  Achieved      PT SHORT TERM GOAL #3   Title  Patient will ambualte 300' without cane with good balance     Baseline  ambulating without a cane with improved balance but still demonstrating mild antalgic gait     Time  3    Period  Weeks    Status  Partially Met        PT Long Term Goals - 11/23/17 8250      PT LONG TERM GOAL #1   Title  Patient will be indepdnent with HEP that promotes blance, LE strength, and a proper gait pattern.     Baseline  continues to work on balance     Time  4    Period  Weeks    Status  Achieved      PT LONG TERM GOAL #2   Title  Patient will ambualte 3000' with LRAD with no LOB     Baseline  Ambulating in clinic without AD and no LOB; reports LOB at work few nights prior as she attempted to increase gait speed but did not fall     Time  4    Period  Weeks    Status  Partially Met      PT LONG TERM GOAL #3   Title  Patient will ambaulte 1000' with normal gait speed     Baseline  decreased gait speed for her age.     Time  4    Period  Weeks    Status  Partially Met            Plan - 11/23/17 0850    Clinical Impression Statement  Patient reported low back pain at time of session secondary to walking 3 miles for the past 4 days. Educated patient on importance of having a  balanced program alternating days with long walks and strengthening program to decrease pain with everyday walking program. Patient hip strength overall gross 5/5 with good hip AROM. Patient reports has not had much hip pain lately as she is able to manage symptoms with HEP. Pt ambulating without SPC but continues to present with mild antalgic gait and decreased velocity. Reports only time she has experienced LOB in past few weeks is when attempting to ambulate at faster gait speeds. Believe pt has reached max potential in PT and was given final HEP for hip, low back pain, and balance to  maintain progress in therapy.     History and Personal Factors relevant to plan of care:  DMIl peripheral neuropathy, anxiety     Clinical Presentation  Stable    Clinical Decision Making  Low    Rehab Potential  Good    PT Frequency  1x / week    PT Duration  3 weeks    PT Treatment/Interventions  ADLs/Self Care Home Management;Cryotherapy;Electrical Stimulation;Iontophoresis 16m/ml Dexamethasone;Ultrasound;Traction;Functional mobility training;Therapeutic activities;Therapeutic exercise;Stair training;Neuromuscular re-education;Patient/family education;Passive range of motion;Manual techniques;Splinting;Taping    PT Home Exercise Plan  bridges, SLR; clamshell red; standing march ,  scissors level 2, wall slides facing wall ; bridge progressions, countertop stretch; balance progressions    Consulted and Agree with Plan of Care  Patient       Patient will benefit from skilled therapeutic intervention in order to improve the following deficits and impairments:  Abnormal gait, Pain, Postural dysfunction, Decreased activity tolerance, Decreased endurance, Decreased range of motion, Decreased strength, Decreased balance  Visit Diagnosis: Other abnormalities of gait and mobility  Muscle weakness (generalized)  Difficulty in walking, not elsewhere classified  PHYSICAL THERAPY DISCHARGE SUMMARY  Visits from Start of Care: 17  Current functional level related to goals / functional outcomes: Improved ability to work out and ambulate    Remaining deficits: Continues to have pain when she over does iActuary/ Equipment: HEP  Plan: Patient agrees to discharge.  Patient goals were met. Patient is being discharged due to meeting the stated rehab goals.  ?????       Problem List Patient Active Problem List   Diagnosis Date Noted  . Overweight 10/12/2017  . Back pain, lumbosacral 07/06/2017  . 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 DM neuropathy, type I diabetes  mellitus ([place] 03/13/2014  . TOBACCO USER 10/27/2008  . Diabetes mellitus type I ([place] 03/18/2006  . Major depressive disorder, recurrent episode ([place] 03/18/2006  . Migraine headache 03/18/2006   DBurnis MedinPT DPT  11/23/2017   KEinar Crow11/05/2017, 9:58 AM   During this treatment session, the therapist was present, participating in and directing the treatment.  [place] [address]Phone: [phone]  Fax:  [phone] Name: Barbara CremeensMRN: [id number] of Birth: 720-Aug-1977

## 2017-11-23 NOTE — Patient Instructions (Signed)
Marching Bridge reps: 10 sets: 3 daily: 1 weekly: 7   Exercise image step 1   Exercise image step 2   Exercise image step 3 Setup  Begin lying on your back with your arms laying straight to your sides, knees bent, and feet flat on the ground. Movement  Tighten your abdominals and slowly lift your hips off the floor into a bridge position. Lift one leg off the ground, keeping your knee bent. Lower it back down and repeat, alternating between each leg. Tip  Make sure to keep your back straight throughout the exercise and your arms flat on the floor. Tandem Stance with Support reps: 10 sets: 3 daily: 1 weekly: 7   Exercise image step 1   Exercise image step 2 Setup  Begin in a standing upright position holding on to a stable object for support. Movement  Place one foot directly in front of the other so you are standing heel-to-toe. Hold this position. Tip  Make sure to maintain your balance during the exercise. Wide Tandem Stance on Foam Pad with Eyes Open reps: 10 sets: 3 daily: 1 weekly: 7   Exercise image step 1   Exercise image step 2 Setup  Begin in a wide staggered stance position on a foam pad with your arms resting at your sides. Movement  Keep your eyes open and hold this position. Tip  Image: MedBridge logo Login URL: [email or web address] Access Code: [email or web address]  Disclaimer: This program provides exercises related to your condition that you can perform at home. As there is a risk of injury with any activity, use caution when performing exercises. If you experience any pain or discomfort, discontinue the exercises and contact your health care provider. Date printed: 11/23/2017  Make sure to maintain your balance during the exercise. Standing Lumbar Spine Flexion Stretch Counter  reps: 10 sets: 3 daily: 1 weekly: 7   Exercise image step 1   Exercise image step 2 Setup  Begin in a standing upright position with your hands resting on a table. Movement  Slowly walk  backward, bending at your hips and keeping your hands on the table, until you feel a stretch in your back. Tip  Make sure to only move in a pain-free range of motion during the exercise. Image: MedBridge logo Login URL: [email or web address] Access Code: [email or web address]  Disclaimer: This program provides exercises related to your condition that you can perform at home. As there is a risk of injury with any activity, use caution when performing exercises. If you experience any pain or discomfort, discontinue the exercises and contact your health care provider. Date printed: 11/23/2017  Marching Bridge reps: 10 sets: 3 daily: 1 weekly: 7   Exercise image step 1   Exercise image step 2   Exercise image step 3 Setup  Begin lying on your back with your arms laying straight to your sides, knees bent, and feet flat on the ground. Movement  Tighten your abdominals and slowly lift your hips off the floor into a bridge position. Lift one leg off the ground, keeping your knee bent. Lower it back down and repeat, alternating between each leg. Tip  Make sure to keep your back straight throughout the exercise and your arms flat on the floor. Tandem Stance with Support reps: 10 sets: 3 daily: 1 weekly: 7   Exercise image step 1   Exercise image step 2 Setup  Begin in a standing upright position holding on to a stable object for support. Movement  Place  one foot directly in front of the other so you are standing heel-to-toe. Hold this position. Tip  Make sure to maintain your balance during the exercise. Wide Tandem Stance on Foam Pad with Eyes Open reps: 10 sets: 3 daily: 1 weekly: 7   Exercise image step 1   Exercise image step 2 Setup  Begin in a wide staggered stance position on a foam pad with your arms resting at your sides. Movement  Keep your eyes open and hold this position. Tip  Image: MedBridge logo Login URL: [email or web address] Access Code: [email or web address]  Disclaimer: This program  provides exercises related to your condition that you can perform at home. As there is a risk of injury with any activity, use caution when performing exercises. If you experience any pain or discomfort, discontinue the exercises and contact your health care provider. Date printed: 11/23/2017  Make sure to maintain your balance during the exercise. Standing Lumbar Spine Flexion Stretch Counter  reps: 10 sets: 3 daily: 1 weekly: 7   Exercise image step 1   Exercise image step 2 Setup  Begin in a standing upright position with your hands resting on a table. Movement  Slowly walk backward, bending at your hips and keeping your hands on the table, until you feel a stretch in your back. Tip  Make sure to only move in a pain-free range of motion during the exercise.

## 2017-12-29 ENCOUNTER — Other Ambulatory Visit: Payer: Self-pay

## 2017-12-29 ENCOUNTER — Encounter: Payer: Self-pay | Admitting: Family Medicine

## 2017-12-29 ENCOUNTER — Ambulatory Visit: Payer: Medicaid Other | Admitting: Family Medicine

## 2017-12-29 VITALS — BP 114/70 | HR 80 | Temp 98.2°F | Ht 67.0 in | Wt 207.0 lb

## 2017-12-29 DIAGNOSIS — E1040 Type 1 diabetes mellitus with diabetic neuropathy, unspecified: Secondary | ICD-10-CM

## 2017-12-29 DIAGNOSIS — M25551 Pain in right hip: Secondary | ICD-10-CM

## 2017-12-29 MED ORDER — TRAMADOL HCL 50 MG PO TABS: 50.0000 mg | ORAL_TABLET | Freq: Two times a day (BID) | ORAL | 0 refills | Status: DC | PRN

## 2017-12-29 MED ORDER — IBUPROFEN 600 MG PO TABS: 600.0000 mg | ORAL_TABLET | Freq: Three times a day (TID) | ORAL | 1 refills | Status: DC | PRN

## 2017-12-29 NOTE — Progress Notes (Signed)
Subjective:    Patient ID: Barbara Clark, female    DOB: 08-16-1975, 42 y.o.   MRN: [id number]  Hip Pain   Incident onset: Right hip pain x 2 weeks. There was no injury mechanism (She stopped exercising because she had to take care of her mom with bells palsy. She was previously walking 5 miles a day). Pain location: Right hip. The quality of the pain is described as aching. The pain is at a severity of 4/10 (When pain is bad, it can get to about 10/10 in severity.). The pain is moderate. The pain has been fluctuating since onset. Associated symptoms include an inability to bear weight. Pertinent negatives include no loss of motion, loss of sensation, muscle weakness, numbness or tingling. The symptoms are aggravated by movement. Treatments tried: Tramadol helps a bit. The treatment provided mild relief.  DM1: Compliant with f/u with endocrinologist. She is currently on insulin pump and gets her A1C checked with them every 3 months.  Current Outpatient Medications on File Prior to Visit  Medication Sig Dispense Refill  . aspirin EC 81 MG tablet Take 81 mg by mouth daily.    . benztropine (COGENTIN) 0.5 MG tablet Take 0.5 mg by mouth daily.  2  . desvenlafaxine (PRISTIQ) 50 MG 24 hr tablet Take 50 mg by mouth daily.    . diphenhydrAMINE (ALLERGY) 25 MG tablet Take 25 mg by mouth as needed for itching or allergies.    . Insulin Human (INSULIN PUMP) SOLN Inject into the skin. Patient uses novolog 1.6 units/hr in insulin pump.    Marland Kitchen lisinopril (PRINIVIL,ZESTRIL) 5 MG tablet Take 5 mg by mouth daily.  3  . TEGRETOL 200 MG tablet Take 200 mg by mouth 3 (three) times daily.  2  . traMADol (ULTRAM) 50 MG tablet Take 1 tablet (50 mg total) by mouth every 12 (twelve) hours as needed. (Patient not taking: Reported on 10/12/2017) 60 tablet 2   No current facility-administered medications on file prior to visit.    Past Medical History:  Diagnosis Date  . Abnormality of gait 12/26/2012  . Anxiety   .  Chronic leg pain 04/27/2014  . Depression   . Diabetes mellitus    Type I  . DKA (diabetic ketoacidosis) ([place]) 09/03/2017  . HSV-2 (herpes simplex virus 2) infection 2012   serology  . Limb weakness 08/04/2013  . Menorrhagia 04/27/2014  . Right leg weakness 02/20/2014  . Stuttering 02/02/2013   Vitals:   12/29/17 0926  BP: 114/70  Pulse: 80  Temp: 98.2 F (36.8 C)  TempSrc: Oral  SpO2: 99%  Weight: 207 lb (93.9 kg)  Height: 5\' 7"  (1.702 m)      Review of Systems  Respiratory: Negative.   Cardiovascular: Negative.   Gastrointestinal: Negative.   Musculoskeletal:       Right hip pain  Neurological: Negative.  Negative for tingling and numbness.  All other systems reviewed and are negative.      Objective:   Physical Exam  Constitutional: She appears well-developed. No distress.  Cardiovascular: Normal rate and regular rhythm.  No murmur heard. Pulmonary/Chest: Effort normal and breath sounds normal. No stridor. No respiratory distress. She has no wheezes.  Abdominal: Soft. Bowel sounds are normal. She exhibits no distension. There is no tenderness. There is no guarding.  Musculoskeletal:       Right hip: She exhibits decreased range of motion and tenderness. She exhibits no crepitus and no deformity.  Left hip: Normal.  Nursing note and vitals reviewed.  Assessment & Plan:  Right hip pain DM1   Indication: Acute pain Medication and dose: Tramadol PRN for breakthrough pain # pills per month: 30 NCCSRS reviewed this encounter (include red flags):  Reviewed today. No red flags.  Check problem list

## 2017-12-29 NOTE — Assessment & Plan Note (Addendum)
Recurrent. Denies new injury. Previous xray neg. Continue Tramadol for breakthrough pain. Use Ibuprofen as needed for moderate pain. Home exercise instruction was given as well. F/U in 2 weeks. Consider MRI and PT if there is no improvement. She agreed with the plan.

## 2017-12-29 NOTE — Patient Instructions (Signed)

## 2017-12-29 NOTE — Assessment & Plan Note (Signed)
She is stable on current regimen. Continue management and A1C check with her endocrinologist.

## 2018-03-21 ENCOUNTER — Other Ambulatory Visit (HOSPITAL_COMMUNITY)
Admission: RE | Admit: 2018-03-21 | Discharge: 2018-03-21 | Disposition: A | Discharge status: Home / Self Care | Payer: Medicaid Other | Source: Ambulatory Visit | Attending: Family Medicine | Admitting: Family Medicine

## 2018-03-21 ENCOUNTER — Ambulatory Visit: Payer: Medicaid Other | Admitting: Student in an Organized Health Care Education/Training Program

## 2018-03-21 ENCOUNTER — Other Ambulatory Visit: Payer: Self-pay

## 2018-03-21 VITALS — BP 128/78 | HR 68 | Temp 98.6°F | Ht 67.0 in | Wt 209.0 lb

## 2018-03-21 DIAGNOSIS — B9689 Other specified bacterial agents as the cause of diseases classified elsewhere: Secondary | ICD-10-CM

## 2018-03-21 DIAGNOSIS — R300 Dysuria: Secondary | ICD-10-CM

## 2018-03-21 DIAGNOSIS — N898 Other specified noninflammatory disorders of vagina: Secondary | ICD-10-CM | POA: Insufficient documentation

## 2018-03-21 DIAGNOSIS — R609 Edema, unspecified: Secondary | ICD-10-CM | POA: Diagnosis not present

## 2018-03-21 DIAGNOSIS — N760 Acute vaginitis: Secondary | ICD-10-CM

## 2018-03-21 DIAGNOSIS — Z113 Encounter for screening for infections with a predominantly sexual mode of transmission: Secondary | ICD-10-CM | POA: Diagnosis not present

## 2018-03-21 LAB — POCT URINALYSIS DIP (MANUAL ENTRY)
Bilirubin, UA: NEGATIVE
Glucose, UA: NEGATIVE mg/dL
Ketones, POC UA: NEGATIVE mg/dL
Leukocytes, UA: NEGATIVE
Nitrite, UA: NEGATIVE
Protein Ur, POC: NEGATIVE mg/dL
Spec Grav, UA: 1.02 (ref 1.010–1.025)
Urobilinogen, UA: 0.2 E.U./dL
pH, UA: 7 (ref 5.0–8.0)

## 2018-03-21 LAB — POCT WET PREP (WET MOUNT)
Clue Cells Wet Prep Whiff POC: POSITIVE
Trichomonas Wet Prep HPF POC: ABSENT

## 2018-03-21 MED ORDER — METRONIDAZOLE 500 MG PO TABS: 500.0000 mg | ORAL_TABLET | Freq: Two times a day (BID) | ORAL | 0 refills | Status: DC

## 2018-03-21 NOTE — Assessment & Plan Note (Signed)
Uncertain etiology. Given history of T1DM will check BMP for renal function. Also ordered CBC to rule out anemia. Patient feels this is from doing "too much" with working two jobs and exercising. If current labwork is negative she should follow up in 4 weeks if symptoms persist or fail to improve.

## 2018-03-21 NOTE — Patient Instructions (Signed)
It was a pleasure seeing you today in our clinic. Here is the treatment plan we have discussed and agreed upon together: We drew blood work at today's visit. I will call or send you a letter with these results. If you do not hear from me within the next week, please give our office a call.   You have bacterial vaginosis. Please take all of your antibiotics.  Our clinic's number is [phone]. Please call with questions or concerns about what we discussed today.  Be well, Dr. Burr Medico

## 2018-03-21 NOTE — Progress Notes (Signed)
   CC: Vaginal pruritus and discharge  HPI: Barbara Clark is a 43 y.o. female with PMH significant for 1 diabetes, tobacco use, MDD, overweight who presents to [facility] today with vaginal discharge and swelling of her hands.  Patient endorses several days of vaginal pruritus and odorous white cottage cheeselike discharge.  She denies dysuria.  Denies hematuria.  Denies urinary frequency or urgency.  She reports that she is sexually active with one female partner and had unprotected intercourse 1 week ago.  She would like STI testing.  Additionally she reports that her sugars have been poorly controlled and she is concerned there may be something on with her pump.  She sees her endocrine doctor tomorrow.  Reports multiple previous episodes of urinary tract infection that presented with her current symptoms.  pregnancy prevention includes Mirena and history of tubal ligation.  Reports that her hands and feet have been swelling over the last couple of weeks.  She attributes this to working 2 jobs and exercising.  She is cut back on her exercise.  She does not have any shortness of breath or palpitations.  She has never had similar symptoms in the past.  She is not having joint pain in her hands.  No fevers, back pain, abdominal pain, nausea, diarrhea, or constipation.  Review of Symptoms - see HPI PMH - Smoking status noted.     Review of Symptoms:  See HPI for ROS.   CC, SH/smoking status, and VS noted.  Objective: BP 128/78   Pulse 68   Temp 98.6 F (37 C) (Oral)   Ht 5\' 7"  (1.702 m)   Wt 209 lb (94.8 kg)   SpO2 99%   BMI 32.73 kg/m  GEN: NAD, alert, cooperative, and pleasant. RESPIRATORY: Comfortable work of breathing, speaks in full sentences CV: Regular rate noted, distal extremities well perfused and warm without edema GI: Soft, nondistended SKIN: warm and dry, no rashes or lesions NEURO: II-XII grossly intact MSK: Moves 4 extremities equally, no swelling noted diffusely in  bilateral hands.  No lower extremity edema. PSYCH: AAOx3, appropriate affect :Female genitalia: normal external genitalia, vulva, vagina, cervix, uterus and adnexa, white milky discharge from cervical os  Urine dipstick shows negative for all components except trace intact blood.  Assessment and plan:  Vaginal discharge Symptoms consistent with yeast infection. Exam **. UA collected because patient has had similar symptoms with a UTI in the past, however she is not having dysuria, frequency or urgency. - wet prep - RPR - HIV Antibody (routine testing w rflx)   Edema of hands Uncertain etiology. Given history of T1DM will check BMP for renal function. Also ordered CBC to rule out anemia. Patient feels this is from doing "too much" with working two jobs and exercising. If current labwork is negative she should follow up in 4 weeks if symptoms persist or fail to improve.   Orders Placed This Encounter  Procedures  . RPR  . HIV Antibody (routine testing w rflx)  . Basic metabolic panel  . CBC  . POCT urinalysis dipstick  . POCT Wet Prep [facility])    Meds ordered this encounter  Medications  . metroNIDAZOLE (FLAGYL) 500 MG tablet    Sig: Take 1 tablet (500 mg total) by mouth 2 (two) times daily.    Dispense:  14 tablet    Refill:  0     Everrett Coombe, MD,MS,  PGY3 03/21/2018 9:54 AM

## 2018-03-21 NOTE — Assessment & Plan Note (Signed)
Symptoms consistent with yeast infection. Exam **. UA collected because patient has had similar symptoms with a UTI in the past, however she is not having dysuria, frequency or urgency. - wet prep - RPR - HIV Antibody (routine testing w rflx)

## 2018-03-22 ENCOUNTER — Encounter (HOSPITAL_COMMUNITY): Payer: Self-pay | Admitting: Student in an Organized Health Care Education/Training Program

## 2018-03-22 LAB — CBC
Hematocrit: 36.4 % (ref 34.0–46.6)
Hemoglobin: 12.3 g/dL (ref 11.1–15.9)
MCH: 30.4 pg (ref 26.6–33.0)
MCHC: 33.8 g/dL (ref 31.5–35.7)
MCV: 90 fL (ref 79–97)
Platelets: 392 10*3/uL (ref 150–450)
RBC: 4.05 x10E6/uL (ref 3.77–5.28)
RDW: 11.8 % (ref 11.7–15.4)
WBC: 6.5 10*3/uL (ref 3.4–10.8)

## 2018-03-22 LAB — BASIC METABOLIC PANEL
BUN/Creatinine Ratio: 14 (ref 9–23)
BUN: 9 mg/dL (ref 6–24)
CO2: 21 mmol/L (ref 20–29)
Calcium: 9.2 mg/dL (ref 8.7–10.2)
Chloride: 102 mmol/L (ref 96–106)
Creatinine, Ser: 0.66 mg/dL (ref 0.57–1.00)
GFR calc Af Amer: 128 mL/min/{1.73_m2} (ref >59)
GFR calc non Af Amer: 111 mL/min/{1.73_m2} (ref >59)
Glucose: 197 mg/dL — ABNORMAL HIGH (ref 65–99)
Potassium: 5.1 mmol/L (ref 3.5–5.2)
Sodium: 139 mmol/L (ref 134–144)

## 2018-03-22 LAB — RPR: RPR Ser Ql: NONREACTIVE

## 2018-03-22 LAB — CERVICOVAGINAL ANCILLARY ONLY
Chlamydia: NEGATIVE
Neisseria Gonorrhea: NEGATIVE

## 2018-03-22 LAB — HIV ANTIBODY (ROUTINE TESTING W REFLEX): HIV Screen 4th Generation wRfx: NONREACTIVE

## 2018-04-18 ENCOUNTER — Other Ambulatory Visit: Payer: Self-pay | Admitting: Family Medicine

## 2018-04-18 ENCOUNTER — Other Ambulatory Visit: Payer: Self-pay

## 2018-04-18 ENCOUNTER — Telehealth (INDEPENDENT_AMBULATORY_CARE_PROVIDER_SITE_OTHER): Payer: Medicaid Other | Admitting: Family Medicine

## 2018-04-18 ENCOUNTER — Telehealth: Payer: Self-pay

## 2018-04-18 DIAGNOSIS — R609 Edema, unspecified: Secondary | ICD-10-CM

## 2018-04-18 DIAGNOSIS — M25532 Pain in left wrist: Secondary | ICD-10-CM

## 2018-04-18 DIAGNOSIS — M25579 Pain in unspecified ankle and joints of unspecified foot: Secondary | ICD-10-CM | POA: Diagnosis not present

## 2018-04-18 DIAGNOSIS — M2540 Effusion, unspecified joint: Secondary | ICD-10-CM

## 2018-04-18 DIAGNOSIS — M25473 Effusion, unspecified ankle: Secondary | ICD-10-CM | POA: Diagnosis not present

## 2018-04-18 DIAGNOSIS — M25531 Pain in right wrist: Secondary | ICD-10-CM

## 2018-04-18 NOTE — Telephone Encounter (Signed)
Virtual visit scheduled for 2:30pm.

## 2018-04-18 NOTE — Telephone Encounter (Signed)
While giving patients daughter a depo shot today, pt wanted me to reach out to PCP to complain of "joint" pain in her hands and ankles. Pt stated every AM and PM her hand and feet begin to swell. Pt stated she was on lyrica and gabapentin previously and this helped. Please advise.

## 2018-04-18 NOTE — Telephone Encounter (Signed)
Please help her schedule telehealth visit for afternoon on faculty template. Thanks.

## 2018-04-18 NOTE — Progress Notes (Signed)
[facility] Telemedicine Visit  Patient consented to have visit conducted via telephone.  Encounter participants: Patient: Barbara Clark  Provider: Andrena Mews    Chief Complaint: Wrist and Ankle swelling  HPI:  Patient c/o ankle and wrist swelling ongoing for three months. She was seen a few weeks ago for this, but she was told this was due to overuse. Currently, she is not working due to the COVID-19 situation. However, her symptoms persist. Her pain is aching in nature, aggravated by movement, about 7/10 in severity. Pain and swelling occur mostly at night or early in the morning. She denies excess water intake or excess salt intake.  The only medication added to her regimen in the last few months was Atorvastatin 10 mg QD. She uses Tramadol or Ibuprofen prn pain with minimal improvement.  She denies SOB, no chest pain, no GI, or GU symptoms.  LMP: She is on Mirena. Hence she does not have a period.  Pertinent PMHx: See problem list  ROS: Joint pain and swelling  Exam: Does not sound like she is in any respiratory distress.  A/P:  Ankle and wrist swelling. Differentials include medication-induced vs. OA vs. RA. Insulin and Cogentin will cause peripheral edema but not joint pain. She is young for OA, although a possibility. I recommended lab work to r/o RA. She will come in for lab work only tomorrow for the following: TSH, ANA, RF, CRP Consider Xray of her joint in the future if labs are fine. In the meantime, I recommended Ibuprofen prn, massage joint with [place] ointment prn. Elevate LL when sitting or lying down.  Reduce salt intake. Wear compression stockings to relieve swelling. The patient verbalized understanding and agreed with the plan. A lab appointment has been scheduled.     Time spent on phone with patient: 14 minutes

## 2018-04-19 ENCOUNTER — Other Ambulatory Visit: Payer: Medicaid Other

## 2018-04-19 ENCOUNTER — Other Ambulatory Visit: Payer: Self-pay

## 2018-04-19 DIAGNOSIS — R609 Edema, unspecified: Secondary | ICD-10-CM

## 2018-04-19 DIAGNOSIS — M25531 Pain in right wrist: Secondary | ICD-10-CM

## 2018-04-19 DIAGNOSIS — M25532 Pain in left wrist: Secondary | ICD-10-CM

## 2018-04-19 DIAGNOSIS — M2540 Effusion, unspecified joint: Secondary | ICD-10-CM

## 2018-04-19 DIAGNOSIS — M25579 Pain in unspecified ankle and joints of unspecified foot: Secondary | ICD-10-CM

## 2018-04-20 ENCOUNTER — Other Ambulatory Visit: Payer: Self-pay | Admitting: Family Medicine

## 2018-04-20 DIAGNOSIS — M25532 Pain in left wrist: Secondary | ICD-10-CM

## 2018-04-20 DIAGNOSIS — M2540 Effusion, unspecified joint: Secondary | ICD-10-CM

## 2018-04-20 DIAGNOSIS — M25531 Pain in right wrist: Secondary | ICD-10-CM

## 2018-04-20 DIAGNOSIS — M25579 Pain in unspecified ankle and joints of unspecified foot: Secondary | ICD-10-CM

## 2018-04-20 LAB — TSH: TSH: 2.19 u[IU]/mL (ref 0.450–4.500)

## 2018-04-20 LAB — C-REACTIVE PROTEIN: CRP: 3 mg/L (ref 0–10)

## 2018-04-20 LAB — AUTOIMMUNE PROFILE
Anti Nuclear Antibody (ANA): NEGATIVE
Complement C3, Serum: 134 mg/dL (ref 82–167)
dsDNA Ab: 1 IU/mL (ref 0–9)

## 2018-04-20 NOTE — Progress Notes (Signed)
You did not send me her information yesterday. If she had already came I will see if we can add it on

## 2018-04-20 NOTE — Progress Notes (Signed)
Interesting. Please look into it. Thanks.

## 2018-04-21 ENCOUNTER — Telehealth: Payer: Self-pay

## 2018-04-21 ENCOUNTER — Ambulatory Visit: Payer: Medicaid Other | Admitting: Family Medicine

## 2018-04-21 ENCOUNTER — Other Ambulatory Visit: Payer: Self-pay

## 2018-04-21 ENCOUNTER — Ambulatory Visit
Admission: RE | Admit: 2018-04-21 | Discharge: 2018-04-21 | Disposition: A | Discharge status: Home / Self Care | Payer: Medicaid Other | Source: Ambulatory Visit | Attending: Family Medicine | Admitting: Family Medicine

## 2018-04-21 VITALS — BP 102/62 | Temp 98.8°F | Wt 212.1 lb

## 2018-04-21 DIAGNOSIS — E1040 Type 1 diabetes mellitus with diabetic neuropathy, unspecified: Secondary | ICD-10-CM | POA: Diagnosis present

## 2018-04-21 DIAGNOSIS — M25531 Pain in right wrist: Secondary | ICD-10-CM

## 2018-04-21 LAB — SPECIMEN STATUS REPORT

## 2018-04-21 LAB — RHEUMATOID FACTOR: Rhuematoid fact SerPl-aCnc: <10 IU/mL (ref 0.0–13.9)

## 2018-04-21 MED ORDER — GABAPENTIN 100 MG PO CAPS: 100.0000 mg | ORAL_CAPSULE | Freq: Three times a day (TID) | ORAL | 3 refills | Status: DC

## 2018-04-21 MED ORDER — MELOXICAM 15 MG PO TABS: 15.0000 mg | ORAL_TABLET | Freq: Every day | ORAL | 0 refills | Status: DC

## 2018-04-21 NOTE — Patient Instructions (Addendum)
It was great to meet you today! Thank you for letting me participate in your care!  Today, we discussed your right wrist pain. I have ordered an xray to ensure nothing traumatic occurred but I expect the x-ray to be normal.   I am sending two prescriptions to the pharmacy. Please pick them up and take them as prescribed. They will help with the pain and swelling. For the medication Meloxicam please take it with food. Please take Gabapentin carefully as it can make you sleepy. I have also ordered a wrist splint for you. You can pick this up at a medical supply store.  Please call our clinic for a follow up visit in 2 weeks to ensure   Be well, Harolyn Rutherford, DO PGY-2, [facility]

## 2018-04-21 NOTE — Progress Notes (Signed)
Subjective: Chief Complaint  Patient presents with  . Joint Swelling     HPI: Barbara Clark is a 43 y.o. presenting to clinic today to discuss the following:  Right Wrist Pain Patient states her right wrist pain started 3 months ago with noticeable swelling after work or activity in both of her feet and hands. She thought she was "over doing it" at the gym so she cut back and saw no improvement in her swelling. She then began developing joint pain about one month ago that started in her feet intermittently. She now has mostly constant right wrist pain with no history of accident, trauma, or fall to the area. She describes the pain as a 10/10 and states it is sharp, throbbing, and "feels like pins and needles". The pain does not radiate. She has some stiffness and associated swelling, the stiffness is NOT worse in the morning. She has tried Tylenol and Tramadol and it has offered little to no benefit along with heating pads and ice packs.  She denies fever, chills, rash, changes in vision, nausea, vomiting, diarrhea,   ROS noted in HPI.   Past Medical, Surgical, Social, and Family History Reviewed & Updated per EMR.   Pertinent Historical Findings include:   Social History   Tobacco Use  Smoking Status Former Smoker  . Packs/day: 1.00  . Types: Cigarettes  . Last attempt to quit: 07/14/2017  . Years since quitting: 0.7  Smokeless Tobacco Never Used  Tobacco Comment   trying to quit    Objective: BP 102/62   Temp 98.8 F (37.1 C) (Oral)   Wt 212 lb 2 oz (96.2 kg)   SpO2 (!) 88%   BMI 33.22 kg/m  Vitals and nursing notes reviewed  Physical Exam Gen: Alert and Oriented x 3, NAD HEENT: Normocephalic, atraumatic, PERRLA, EOMI, MSK: Wrist, Right: TTP at the anatomic snuff box and the [facility] joint with notable selling extending up the wrist into the radial side of the forearm. Inspection yielded no erythema, ecchymosis, bony deformity. ROM markedly decreased in flexion and  extension, noted pain on these movements during active and passive motion. No ulnar/radial deviation that is symmetrical with opposite wrist. Palpation is normal over metacarpals, lunate, and TFCC; tendons with out tenderness/swelling. TTP over the scaphoid. Strength 5/5 in all directions without pain. Positive Finkelstein, tinel's and phalens. Gross sensation intact. +2 distal radial pulses bilaterally. Ext: no clubbing, cyanosis, or edema Neuro: No gross deficits Skin: warm, dry, intact, no rashes  Results for orders placed or performed in visit on 04/19/18 (from the past 72 hour(s))  Autoimmune Profile     Status: None   Collection Time: 04/19/18  8:47 AM  Result Value Ref Range   dsDNA Ab 1 0 - 9 IU/mL    Comment:                                    Negative      <5                                    Equivocal  5 - 9                                    Positive      >  9    Complement C3, Serum 134 82 - 167 mg/dL   Anti Nuclear Antibody (ANA) Negative Negative  C-reactive protein     Status: None   Collection Time: 04/19/18  8:47 AM  Result Value Ref Range   CRP 3 0 - 10 mg/L  TSH     Status: None   Collection Time: 04/19/18  8:47 AM  Result Value Ref Range   TSH 2.190 0.450 - 4.500 uIU/mL  Rheumatoid factor     Status: None   Collection Time: 04/19/18  8:47 AM  Result Value Ref Range   Rhuematoid fact SerPl-aCnc <10.0 0.0 - 13.9 IU/mL  Specimen status report     Status: None   Collection Time: 04/19/18  8:47 AM  Result Value Ref Range   specimen status report Comment     Comment: Written Authorization Written Authorization Written Authorization Received. Authorization received from [place] 04-20-2018 Logged by Ivar Bury     Assessment/Plan:  Right wrist pain Etiology of her right wrist pain remains broad. Seronegative Auto-immune disease still possible although unlikely as her CRP was normal. She has had an extensive work up and all labs have been normal. MSK  overuse injury with associated nerve impingement possible however at 3 months I would expect improvement and not worsening. DeQuervain's Tenosynovitis seems possible given exam findings. Could also be DM neuropathy as a contributing factor. - Right wrist x-ray to rule out any arthritis changes, fracture, or acute abnormality - Thumb Spica Splint to be worn ONLY during the day for the next several weeks - Stop Tramadol since it is not helping - Start Meloxicam 15mg  daily with food for 14 days to help with swelling and pain - Gabapentin 100mg  TID   PATIENT EDUCATION PROVIDED: See AVS    Diagnosis and plan along with any newly prescribed medication(s) were discussed in detail with this patient today. The patient verbalized understanding and agreed with the plan. Patient advised if symptoms worsen return to clinic or ER.   Health Maintainance:   Orders Placed This Encounter  Procedures  . HgB A1c    Meds ordered this encounter  Medications  . gabapentin (NEURONTIN) 100 MG capsule    Sig: Take 1 capsule (100 mg total) by mouth 3 (three) times daily.    Dispense:  90 capsule    Refill:  3  . meloxicam (MOBIC) 15 MG tablet    Sig: Take 1 tablet (15 mg total) by mouth daily.    Dispense:  14 tablet    Refill:  0     Harolyn Rutherford, DO 04/21/2018, 1:59 PM PGY-2 [place]

## 2018-04-21 NOTE — Telephone Encounter (Signed)
Done. Patient coming in this afternoon.

## 2018-04-21 NOTE — Assessment & Plan Note (Signed)
Etiology of her right wrist pain remains broad. Seronegative Auto-immune disease still possible although unlikely as her CRP was normal. She has had an extensive work up and all labs have been normal. MSK overuse injury with associated nerve impingement possible however at 3 months I would expect improvement and not worsening. DeQuervain's Tenosynovitis seems possible given exam findings. Could also be DM neuropathy as a contributing factor. - Right wrist x-ray to rule out any arthritis changes, fracture, or acute abnormality - Thumb Spica Splint to be worn ONLY during the day for the next several weeks - Stop Tramadol since it is not helping - Start Meloxicam 15mg  daily with food for 14 days to help with swelling and pain - Gabapentin 100mg  TID

## 2018-04-21 NOTE — Telephone Encounter (Signed)
Please help her schedule ATC visit. Thanks

## 2018-04-21 NOTE — Telephone Encounter (Signed)
I called patient to inform her of normal lab results. Pt stated, "then what is the problem, I am still in a lot of pain and my wrist is swollen." Pt stated her pain level is 10/10 with no relief from Tramadol. Please advise.

## 2018-05-11 ENCOUNTER — Telehealth (INDEPENDENT_AMBULATORY_CARE_PROVIDER_SITE_OTHER): Payer: Medicaid Other | Admitting: Family Medicine

## 2018-05-11 ENCOUNTER — Other Ambulatory Visit: Payer: Self-pay

## 2018-05-11 DIAGNOSIS — M2540 Effusion, unspecified joint: Secondary | ICD-10-CM

## 2018-05-11 DIAGNOSIS — M25531 Pain in right wrist: Secondary | ICD-10-CM

## 2018-05-11 DIAGNOSIS — R202 Paresthesia of skin: Secondary | ICD-10-CM

## 2018-05-11 DIAGNOSIS — R609 Edema, unspecified: Secondary | ICD-10-CM

## 2018-05-11 MED ORDER — DICLOFENAC SODIUM 50 MG PO TBEC: 50.0000 mg | DELAYED_RELEASE_TABLET | Freq: Three times a day (TID) | ORAL | 1 refills | Status: DC

## 2018-05-11 NOTE — Progress Notes (Signed)
[facility] Telemedicine Visit  Patient consented to have virtual visit. Method of visit: Telephone  Encounter participants: Patient: Barbara Clark - located at home Provider: Danna Hefty - located at [facility] Others (if applicable): None  Chief Complaint: Follow up for Right wrist  HPI: Patient seen on 04/21/18 for swelling and pain of her right wrist for> 3 months. She has attempted tramadol with no improvement. She had x-ray of wrist that was negative for fracture, dislocation, arthropathy, or focal bone abnormality. Work up includes negative RF, TSH, CRP, and autoimmune profile. She notes the swelling has improved but still painful at thumb and wrist area, notes pain is achy, constant, and 7/10 currently. Has been using the splint during the day which has helped, but more pain at night when she isnt wearing it. She notes worsening pain when she bends her wrist. She notes the Meloxicam "helped with the swelling and a little with the pain". However, she did endorse some abdominal pain while taking the Meloxicam. She wasn't sure if it was the medicine or constipation. She took a laxative a few days ago and the pain has resolved. She has also been treating with Gabapentin and that has been helping too.   She notes she has been having bilateral wrist and ankle swelling for >3 months that occur mostly at night and early in the morning. 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 She notes the pain is localized to the right base of the thumb and this is constant. The left wrist swells but no pain. Also endorses numbness/tingling in fingers bilaterally. When wearing the splint it helps the swelling and pain, but not with the numbness or tingling. She began experiencing mild swelling of her extremities when she started walking a lot and exercising that would self resolve shortly after rest. However, now it has gotten worse and lasts a lot longer.  She also endorsed drinking a lot of water, which she was informed could worsen her swelling.     Of note, patient was using cane and walker for >5 years. After improvement in her T1DM with insulin pump, she had improvement in her weakness where she was no longer having to rely on cane/walker. Underwent outpatient rehab. She stopped using cane and walker in September 2019 and started working at current job in October 2019. Of note, was experiencing bilateral LE swelling at that time attributed to excessive walking (walking 10 miles a day). She began experiencing right wrist pain with the swelling starting in November 2019. At onset of pain she was working, going to the gym, and walking 5 miles. She has been told her pain was likely due to overuse in which she has cut back on all activities without any improvement.  Patient grew up in foster care, so not sure about family history.   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 Breathing Comfortably Psych:  Cognition and judgment appear intact. Alert, communicative  and cooperative with normal attention span and concentration.    Assessment/Plan:  Right wrist pain Spent a lot of time with the patient and reviewing her chart. It appears she has experienced these symptoms for some time now with negative work up thus far. Her work up for autoimmune etiology has returned unremarkable. X-ray negative for arthritic changes. Per Dr. Arlana Pouch exam on 4/2,  she had notable swelling extending up the wrist into the radial side of the forearm, positive Finkelstein's, Tinel's and Phalen's with pain with both passive and active motion, although strength 5/5 in all directions without pain. These findings make Dequervain's tenosynovitis still high on the differential. Additionally, with her reported neuropathy,  peripheral neuropathy from her DM vs tendon neuropathy from over use vs compressive neuropathy from carpal tunnel are also likely. At this time, will attempt to rule out peripheral vs compression neuropathy with EMG. Referral placed. Recommended hand split at night and referral to the [facility] for evaluation, therapy and splinting.  Given that she had abdominal discomfort with Meloxicam, will prescribe Diclofenac 50mg  TID for pain and swelling and increase Gabapentin to 200mg  TID. Can consider voltaren gel if further relief is needed.  Given difficulty to see Dequervain's on x-ray, can consider ultrasound of the wrist to evaluate for thickening of the respective tendon's. If positive, can consider steroid injection.  Patient scheduled for follow-up telemedicine visit on 06/02/18.  Of note, upon review of chart, it appears patient has had history of neurological symptoms including aphasia, ataxia, weakness, and LE pain with inconsistent physical exam findings. She had extensive work up by neurology including MRI of cervical, thoracic, and lumbar spine, EMG of her RLE, and x-ray's which were all unremarkable. There was some concern that her symptoms were psychogenic due to conversion disorder. Although I do not believe this is the case in this situation, I do feel it is necessary to take note of this pertinent PMH.  Time spent during visit with patient: 35 minutes  Mina Marble, [place], PGY1 05/11/2018

## 2018-05-12 NOTE — Assessment & Plan Note (Addendum)
Spent a lot of time with the patient and reviewing her chart. It appears she has experienced these symptoms for some time now with negative work up thus far. Her work up for autoimmune etiology has returned unremarkable. X-ray negative for arthritic changes. Per Dr. Arlana Pouch exam on 4/2, she had notable swelling extending up the wrist into the radial side of the forearm, positive Finkelstein's, Tinel's and Phalen's with pain with both passive and active motion, although strength 5/5 in all directions without pain. These findings make Dequervain's tenosynovitis still high on the differential. Additionally, with her reported neuropathy, peripheral neuropathy from her DM vs tendon neuropathy from over use vs compressive neuropathy from carpal tunnel are also likely. At this time, will attempt to rule out peripheral vs compression neuropathy with EMG. Referral placed. Recommended hand split at night and referral to the [facility] for evaluation, therapy and splinting.  Given that she had abdominal discomfort with Meloxicam, will prescribe Diclofenac 50mg  TID for pain and swelling and increase Gabapentin to 200mg  TID. Can consider voltaren gel if further relief is needed.  Given difficulty to see Dequervain's on x-ray, can consider ultrasound of the wrist to evaluate for thickening of the respective tendon's. If positive, can consider steroid injection.  Patient scheduled for follow-up telemedicine visit on 06/02/18.  Of note, upon review of chart, it appears patient has had history of neurological symptoms including aphasia, ataxia, weakness, and LE pain with inconsistent physical exam findings. She had extensive work up by neurology including MRI of cervical, thoracic, and lumbar spine, EMG of her RLE, and x-ray's which were all unremarkable. There was some concern that her symptoms were psychogenic due to conversion disorder. Although I do not believe this is the case in this situation, I do feel it is  necessary to take note of this pertinent PMH.

## 2018-05-26 LAB — HM DIABETES EYE EXAM

## 2018-06-02 ENCOUNTER — Telehealth (INDEPENDENT_AMBULATORY_CARE_PROVIDER_SITE_OTHER): Payer: Medicaid Other | Admitting: Family Medicine

## 2018-06-02 ENCOUNTER — Other Ambulatory Visit: Payer: Self-pay

## 2018-06-02 ENCOUNTER — Telehealth: Payer: Self-pay | Admitting: *Deleted

## 2018-06-02 ENCOUNTER — Encounter: Payer: Self-pay | Admitting: Family Medicine

## 2018-06-02 ENCOUNTER — Telehealth: Payer: Self-pay

## 2018-06-02 DIAGNOSIS — M1811 Unilateral primary osteoarthritis of first carpometacarpal joint, right hand: Secondary | ICD-10-CM

## 2018-06-02 DIAGNOSIS — M25531 Pain in right wrist: Secondary | ICD-10-CM

## 2018-06-02 DIAGNOSIS — E1142 Type 2 diabetes mellitus with diabetic polyneuropathy: Secondary | ICD-10-CM | POA: Diagnosis not present

## 2018-06-02 DIAGNOSIS — N760 Acute vaginitis: Secondary | ICD-10-CM

## 2018-06-02 MED ORDER — GABAPENTIN 100 MG PO CAPS: ORAL_CAPSULE | ORAL | 3 refills | Status: DC

## 2018-06-02 MED ORDER — DICLOFENAC SODIUM 1 % TD GEL: 2.0000 g | Freq: Four times a day (QID) | TRANSDERMAL | 5 refills | Status: DC

## 2018-06-02 MED ORDER — FLUCONAZOLE 150 MG PO TABS: ORAL_TABLET | ORAL | 0 refills | Status: DC

## 2018-06-02 NOTE — Telephone Encounter (Signed)
Completed PA info in [place] for diclofenac gel.  Status pending.  Will recheck status in 24 hours. Christen Bame, CMA

## 2018-06-02 NOTE — Progress Notes (Signed)
Barbara Clark  This visit type was conducted due to national recommendations for restrictions regarding the COVID-19 Pandemic (e.g. social distancing) in an effort to limit this patient's exposure and mitigate transmission in our community.  Due to her co-morbid illnesses, this patient is at least at moderate risk for complications without adequate follow up.  This format is felt to be most appropriate for this patient at this time.  All issues noted in this document were discussed and addressed.  A limited physical exam was performed with this format.   Patient consented to have visit conducted via tele  Encounter participants: Patient: Barbara Clark  Patient Location: Home Provider: Sherren Mocha Nakoa Ganus at office Others (if applicable): none  Chief Complaint: fu r wrist pain  HPI:  Right wrist pain Right handed persojn Onset: after going back to work in Fall last year Location: base of right hand's thumb Function: continues to work in [employer]  Course: about same. Radiation: currently primarily in base of thumb Relief: Both oral meloxicam and diclofenac caused constipation and stomach upset, so she did not take them Associated Symptoms:       Restricted ROM/stiffness/swelling:  Swelling at base of thumb/wrist       Muscle ache/cramp/spasms: no       Color or temperature change: no       Muscle strength change: no       Change in sensation (dysesthesia/itch or numbness): intermittent numbness  Trauma (Acute or Chronic): no. Possible repetitive stress from work Prior Diagnostic Testing or Treatments: Thumb spica splint, meloxicam, diclofenac.  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  Patient with GI intolerance of Meloxicam and to Diclofenac. Pt would like to try diclofenac topical gel to base of thumb.  Rx sent in for Diclofenac gel 1% 2 gram applied QID. Continue wearing wrist spint with thumb spice   [name] CMA, is working on [place] referral and NCV/EMG that did not go thru at last visit. Revisit with patient once referral and testing have occurred.   Vaginitis Possible recurrent yeast vaginitis Rx Diflucan 150 mg x 1 then repeat dose in three days.  Notify office if not improvement.    Time spent on phone with patient: 25 minutes

## 2018-06-02 NOTE — Assessment & Plan Note (Addendum)
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  Patient with GI intolerance of Meloxicam and to Diclofenac. Pt would like to try diclofenac topical gel to base of thumb.  Rx sent in for Diclofenac gel 1% 2 gram applied QID. Continue wearing wrist spint with thumb spice   Ms. Leggette CMA, is working on [place] referral and NCV/EMG that did not go thru at last visit. Revisit with patient once referral and testing have occurred.

## 2018-06-03 NOTE — Telephone Encounter (Signed)
Medication approved 06/02/2018 - 07/02/2018.  [place].  Christen Bame, CMA

## 2018-06-03 NOTE — Assessment & Plan Note (Signed)
Possible recurrent yeast vaginitis Rx Diflucan 150 mg x 1 then repeat dose in three days.  Notify office if not improvement.

## 2018-07-29 ENCOUNTER — Encounter: Payer: Self-pay | Admitting: Family Medicine

## 2018-07-29 ENCOUNTER — Ambulatory Visit (INDEPENDENT_AMBULATORY_CARE_PROVIDER_SITE_OTHER): Payer: Medicaid Other | Admitting: Family Medicine

## 2018-07-29 ENCOUNTER — Other Ambulatory Visit: Payer: Self-pay

## 2018-07-29 ENCOUNTER — Other Ambulatory Visit (HOSPITAL_COMMUNITY)
Admission: RE | Admit: 2018-07-29 | Discharge: 2018-07-29 | Disposition: A | Discharge status: Home / Self Care | Payer: Medicaid Other | Source: Ambulatory Visit | Attending: Family Medicine | Admitting: Family Medicine

## 2018-07-29 VITALS — BP 102/62 | HR 81 | Ht 67.0 in | Wt 212.1 lb

## 2018-07-29 DIAGNOSIS — E1040 Type 1 diabetes mellitus with diabetic neuropathy, unspecified: Secondary | ICD-10-CM | POA: Diagnosis not present

## 2018-07-29 DIAGNOSIS — Z1272 Encounter for screening for malignant neoplasm of vagina: Secondary | ICD-10-CM | POA: Diagnosis not present

## 2018-07-29 DIAGNOSIS — K648 Other hemorrhoids: Secondary | ICD-10-CM

## 2018-07-29 DIAGNOSIS — E108 Type 1 diabetes mellitus with unspecified complications: Secondary | ICD-10-CM

## 2018-07-29 DIAGNOSIS — R58 Hemorrhage, not elsewhere classified: Secondary | ICD-10-CM | POA: Insufficient documentation

## 2018-07-29 NOTE — Patient Instructions (Addendum)
Dear Barbara Clark,   It was good to see you! Thank you for taking your time to come in to be seen. Today, we discussed the following:   Rectal bleeding, hemorrhoids  On exam today, there were obvious internal hemorrhoids  I have attached some information about internal hemorrhoids and further treatment as below.  First, increase the fiber in your diet avoid constipation and to avoid straining, which is the cause of hemorrhoids.  Avoid any penetration to the area until pain has subsided  I am collecting a blood level today.  If your blood levels are low, I will send a referral to GI for colonoscopy or further imaging.  If you levels are normal, we will continue to monitor for about a month.  If your symptoms get worse at this time, further imaging would be warranted.  Pap  We will reach out to you for any concerning results.  Please follow up in 1 month or sooner for concerning or worsening symptoms.   Be well,   Zettie Cooley, M.D   [place] [phone]  *Sign up for MyChart for instant access to your health profile, labs, orders, upcoming appointments or to contact your provider with questions*  ===================================================================================  Hemorrhoids Hemorrhoids are swollen veins in and around the rectum or anus. There are two types of hemorrhoids:  Internal hemorrhoids. These occur in the veins that are just inside the rectum. They may poke through to the outside and become irritated and painful.  External hemorrhoids. These occur in the veins that are outside the anus and can be felt as a painful swelling or hard lump near the anus. Most hemorrhoids do not cause serious problems, and they can be managed with home treatments such as diet and lifestyle changes. If home treatments do not help the symptoms, procedures can be done to shrink or remove the hemorrhoids. What are the causes? This condition is caused by  increased pressure in the anal area. This pressure may result from various things, including:  Constipation.  Straining to have a bowel movement.  Diarrhea.  Pregnancy.  Obesity.  Sitting for long periods of time.  Heavy lifting or other activity that causes you to strain.  Anal sex.  Riding a bike for a long period of time. What are the signs or symptoms? Symptoms of this condition include:  Pain.  Anal itching or irritation.  Rectal bleeding.  Leakage of stool (feces).  Anal swelling.  One or more lumps around the anus. How is this diagnosed? This condition can often be diagnosed through a visual exam. Other exams or tests may also be done, such as:  An exam that involves feeling the rectal area with a gloved hand (digital rectal exam).  An exam of the anal canal that is done using a small tube (anoscope).  A blood test, if you have lost a significant amount of blood.  A test to look inside the colon using a flexible tube with a camera on the end (sigmoidoscopy or colonoscopy). How is this treated? This condition can usually be treated at home. However, various procedures may be done if dietary changes, lifestyle changes, and other home treatments do not help your symptoms. These procedures can help make the hemorrhoids smaller or remove them completely. Some of these procedures involve surgery, and others do not. Common procedures include:  Rubber band ligation. Rubber bands are placed at the base of the hemorrhoids to cut off their blood supply.  Sclerotherapy. Medicine is injected  into the hemorrhoids to shrink them.  Infrared coagulation. A type of light energy is used to get rid of the hemorrhoids.  Hemorrhoidectomy surgery. The hemorrhoids are surgically removed, and the veins that supply them are tied off.  Stapled hemorrhoidopexy surgery. The surgeon staples the base of the hemorrhoid to the rectal wall. Follow these instructions at home: Eating and  drinking   Eat foods that have a lot of fiber in them, such as whole grains, beans, nuts, fruits, and vegetables.  Ask your health care provider about taking products that have added fiber (fiber supplements).  Reduce the amount of fat in your diet. You can do this by eating low-fat dairy products, eating less red meat, and avoiding processed foods.  Drink enough fluid to keep your urine pale yellow. Managing pain and swelling   Take warm sitz baths for 20 minutes, 3-4 times a day to ease pain and discomfort. You may do this in a bathtub or using a portable sitz bath that fits over the toilet.  If directed, apply ice to the affected area. Using ice packs between sitz baths may be helpful. ? Put ice in a plastic bag. ? Place a towel between your skin and the bag. ? Leave the ice on for 20 minutes, 2-3 times a day. General instructions  Take over-the-counter and prescription medicines only as told by your health care provider.  Use medicated creams or suppositories as told.  Get regular exercise. Ask your health care provider how much and what kind of exercise is best for you. In general, you should do moderate exercise for at least 30 minutes on most days of the week (150 minutes each week). This can include activities such as walking, biking, or yoga.  Go to the bathroom when you have the urge to have a bowel movement. Do not wait.  Avoid straining to have bowel movements.  Keep the anal area dry and clean. Use wet toilet paper or moist towelettes after a bowel movement.  Do not sit on the toilet for long periods of time. This increases blood pooling and pain.  Keep all follow-up visits as told by your health care provider. This is important. Contact a health care provider if you have:  Increasing pain and swelling that are not controlled by treatment or medicine.  Difficulty having a bowel movement, or you are unable to have a bowel movement.  Pain or inflammation outside  the area of the hemorrhoids. Get help right away if you have:  Uncontrolled bleeding from your rectum. Summary  Hemorrhoids are swollen veins in and around the rectum or anus.  Most hemorrhoids can be managed with home treatments such as diet and lifestyle changes.  Taking warm sitz baths can help ease pain and discomfort.  In severe cases, procedures or surgery can be done to shrink or remove the hemorrhoids. This information is not intended to replace advice given to you by your health care provider. Make sure you discuss any questions you have with your health care provider. Document Released: 01/03/2000 Document Revised: 01/13/2018 Document Reviewed: 05/27/2017 Elsevier Patient Education  2020 [employer].  How to Take a [employer] A sitz bath is a warm water bath that may be used to care for your rectum, genital area, or the area between your rectum and genitals (perineum). For a sitz bath, the water only comes up to your hips and covers your buttocks. A sitz bath may done at home in a bathtub or with a  portable sitz bath that fits over the toilet. Your health care provider may recommend a sitz bath to help:  Relieve pain and discomfort after delivering a baby.  Relieve pain and itching from hemorrhoids or anal fissures.  Relieve pain after certain surgeries.  Relax muscles that are sore or tight. How to take a sitz bath Take 3-4 sitz baths a day, or as many as told by your health care provider. Bathtub sitz bath To take a sitz bath in a bathtub: 1. Partially fill a bathtub with warm water. The water should be deep enough to cover your hips and buttocks when you are sitting in the tub. 2. If your health care provider told you to put medicine in the water, follow his or her instructions. 3. Sit in the water. 4. Open the tub drain a little, and leave it open during your bath. 5. Turn on the warm water again, enough to replace the water that is draining out. Keep the water  running throughout your bath. This helps keep the water at the right level and the right temperature. 6. Soak in the water for 15-20 minutes, or as long as told by your health care provider. 7. When you are done, be careful when you stand up. You may feel dizzy. 8. After the sitz bath, pat yourself dry. Do not rub your skin to dry it.  Over-the-toilet sitz bath To take a sitz bath with an over-the-toilet basin: 1. Follow the manufacturer's instructions. 2. Fill the basin with warm water. 3. If your health care provider told you to put medicine in the water, follow his or her instructions. 4. Sit on the seat. Make sure the water covers your buttocks and perineum. 5. Soak in the water for 15-20 minutes, or as long as told by your health care provider. 6. After the sitz bath, pat yourself dry. Do not rub your skin to dry it. 7. Clean and dry the basin between uses. 8. Discard the basin if it cracks, or according to the manufacturer's instructions. Contact a health care provider if:  Your symptoms get worse. Do not continue with sitz baths if your symptoms get worse.  You have new symptoms. If this happens, do not continue with sitz baths until you talk with your health care provider. Summary  A sitz bath is a warm water bath in which the water only comes up to your hips and covers your buttocks.  A sitz bath may help relieve itching, relieve pain, and relax muscles that are sore or tight in the lower part of your body, including your genital area.  Take 3-4 sitz baths a day, or as many as told by your health care provider. Soak in the water for 15-20 minutes.  Do not continue with sitz baths if your symptoms get worse. This information is not intended to replace advice given to you by your health care provider. Make sure you discuss any questions you have with your health care provider. Document Released: 09/28/2003 Document Revised: 01/07/2017 Document Reviewed: 01/07/2017 Elsevier  Patient Education  [place]. Hydrocortisone suppositories What is this medicine? HYDROCORTISONE (hye droe KOR ti sone) is a corticosteroid. It is used to decrease swelling, itching, and pain that is caused by minor skin irritations or by hemorrhoids. This medicine may be used for other purposes; ask your health care provider or pharmacist if you have questions. COMMON BRAND NAME(S): Anucort-HC, Anumed-HC, Anusol HC, Encort, GRx HiCort, Hemmorex-HC, Hemorrhoidal-HC, Hemril, Proctocort, Proctosert HC, Proctosol-HC, Rectacort HC,  Rectasol-HC What should I tell my health care provider before I take this medicine? They need to know if you have any of these conditions:  an unusual or allergic reaction to hydrocortisone, corticosteroids, other medicines, foods, dyes, or preservatives  pregnant or trying to get pregnant  breast-feeding How should I use this medicine? This medicine is for rectal use only. Do not take by mouth. Wash your hands before and after use. Take off the foil wrapping. Wet the tip of the suppository with cold tap water to make it easier to use. Lie on your side with your lower leg straightened out and your upper leg bent forward toward your stomach. Lift upper buttock to expose the rectal area. Apply gentle pressure to insert the suppository completely into the rectum, pointed end first. Hold buttocks together for a few seconds. Remain lying down for about 15 minutes to avoid having the suppository come out. Do not use more often than directed. Talk to your pediatrician regarding the use of this medicine in children. Special care may be needed. Overdosage: If you think you have taken too much of this medicine contact a poison control center or emergency room at once. NOTE: This medicine is only for you. Do not share this medicine with others. What if I miss a dose? If you miss a dose, use it as soon as you can. If it is almost time for your next dose, use only that dose.  Do not use double or extra doses. What may interact with this medicine? Interactions are not expected. Do not use any other rectal products on the affected area without telling your doctor or health care professional. This list may not describe all possible interactions. Give your health care provider a list of all the medicines, herbs, non-prescription drugs, or dietary supplements you use. Also tell them if you smoke, drink alcohol, or use illegal drugs. Some items may interact with your medicine. What should I watch for while using this medicine? Visit your doctor or health care professional for regular checks on your progress. Tell your doctor or health care professional if your symptoms do not improve after a few days of use. Do not use if there is blood in your stools. If you get any type of infection while using this medicine, you may need to stop using this medicine until our infections clears up. Ask your doctor or health care professional for advice. What side effects may I notice from receiving this medicine? Side effects that you should report to your doctor or health care professional as soon as possible:  bloody or black, tarry stools  painful, red, pus filled blisters in hair follicles  rectal pain, burning or bleeding after use of medicine Side effects that usually do not require medical attention (report to your doctor or health care professional if they continue or are bothersome):  changes in skin color  dry skin  itching or irritation This list may not describe all possible side effects. Call your doctor for medical advice about side effects. You may report side effects to FDA at 1-800-FDA-1088. Where should I keep my medicine? Keep out of the reach of children. Store at room temperature between 20 and 25 degrees C (68 and 77 degrees F). Protect from heat and freezing. Throw away any unused medicine after the expiration date. NOTE: This sheet is a summary. It may not  cover all possible information. If you have questions about this medicine, talk to your doctor, pharmacist, or health care  provider.  2020 Elsevier/Gold Standard (2007-05-20 16:07:24)

## 2018-07-30 LAB — CBC
Hematocrit: 37.1 % (ref 34.0–46.6)
Hemoglobin: 12.6 g/dL (ref 11.1–15.9)
MCH: 29.8 pg (ref 26.6–33.0)
MCHC: 34 g/dL (ref 31.5–35.7)
MCV: 88 fL (ref 79–97)
Platelets: 385 10*3/uL (ref 150–450)
RBC: 4.23 x10E6/uL (ref 3.77–5.28)
RDW: 11.6 % — ABNORMAL LOW (ref 11.7–15.4)
WBC: 7.7 10*3/uL (ref 3.4–10.8)

## 2018-07-30 LAB — RPR: RPR Ser Ql: NONREACTIVE

## 2018-07-30 LAB — HIV ANTIBODY (ROUTINE TESTING W REFLEX): HIV Screen 4th Generation wRfx: NONREACTIVE

## 2018-07-31 ENCOUNTER — Encounter: Payer: Self-pay | Admitting: Family Medicine

## 2018-07-31 DIAGNOSIS — K648 Other hemorrhoids: Secondary | ICD-10-CM | POA: Insufficient documentation

## 2018-07-31 NOTE — Progress Notes (Signed)
  Established Patient - Acute Visit Subjective  Subjective  Patient ID: MRN [id number]  Date of birth: [birth year]   PCP: Barbara Feil, MD  CC: Rectal Bleeding  Barbara Clark is a 43 y.o. female with past medical history significant for DM 1 who presents today with Anal pain bleeding. Patient reports anal pain started last Wednesday (07/27/18) after penetration with an unstated object.  She reports bright red blood on toilet paper and pain with wiping.  She denies any blood that was mixed in with her stool.  She does report that she has constipation.  She does not have any history of GI bleeding.  She is adopted and does not know if she has any family history of colon cancer.  She denies any gross clots, dark blood, melena.  She does report having some dizziness, but denies tachycardia fatigue.   HISTORY Medications, allergies, medical history, family history and social history were reviewed and edited as necessary. Pertinent findings included in HPI.  Social Hx: [name] reports that she quit smoking about 12 months ago. Her smoking use included cigarettes. She smoked 1.00 pack per day. She has never used smokeless tobacco. She reports current alcohol use. She reports that she does not use drugs. ROS: See HPI    Objective   Objective  Physical Exam:  BP 102/62   Pulse 81   Ht 5\' 7"  (1.702 m)   Wt 212 lb 2 oz (96.2 kg)   SpO2 98%   BMI 33.22 kg/m  General: NAD, non-toxic, well-appearing, sitting on exam table. She is uncomfortable with adjusting positions.  Cardiovascular: RRR, no BLEE Respiratory: CTAB. No IWOB.  Abdomen: + BS. NT, ND, soft to palpation.  Extremities: Warm and well perfused. Moving spontaneously.  Integumentary: No obvious rashes, lesions, trauma on general exam. GU: External vulva and vagina nonerythematous, without any obvious lesions or rash.  No discharge appreciated. Normal ruggae of vaginal walls.  Cervix is non erythematous and non-friable.  There is no  cervical motion tenderness, masses or gross abnormalities appreciated during bimanual exam.   Rectal exam: prolapsed hemorrhoid appreciated on observation. Anoscope used, with no evidence of trauma or bleeding proximal to hemorrhoid. Bleeding from hemorrhoid appreciated. Painful to palpation. No evidence of fissures.   Pertinent Labs & Imaging:  07/29/18 Hgb wnl   Assessment  Assessment & Plan  Internal bleeding hemorrhoids Appreciated on exam. Very likely main source of bleed. If heavier bleeding, pt to return and will refer to GI. As patient reports dizziness, obtained CBC which was normal. Start daily stool softener, increase fiber in diet, drink water to help with constipation. Preparation H & stiz baths. Avoid penetration   Vaginal Pap smear Patient due for Pap this year.  As patient is getting undressed for annual exam, will obtain Pap smear today.  HIV and RPR negative.  DM neuropathy, type I diabetes mellitus A1c ordered has no recent A1c recorded.  Unfortunately A1c not obtained in lab.  She will need this at next visit.  Follow-up:  Future Appointments  Date Time Provider [place]  08/02/2018  8:50 AM Barbara Feil, MD [facility]      Wilber Oliphant, M.D.  PGY-2  Family Medicine  07/31/2018 2:37 AM

## 2018-07-31 NOTE — Assessment & Plan Note (Signed)
A1c ordered has no recent A1c recorded.  Unfortunately A1c not obtained in lab.  She will need this at next visit.

## 2018-07-31 NOTE — Assessment & Plan Note (Addendum)
Appreciated on exam. Very likely main source of bleed. If heavier bleeding, pt to return and will refer to GI. As patient reports dizziness, obtained CBC which was normal. Start daily stool softener, increase fiber in diet, drink water to help with constipation. Preparation H & stiz baths. Avoid penetration

## 2018-07-31 NOTE — Assessment & Plan Note (Addendum)
Patient due for Pap this year.  As patient is getting undressed for annual exam, will obtain Pap smear today.  HIV and RPR negative.

## 2018-08-02 ENCOUNTER — Encounter: Payer: Self-pay | Admitting: Family Medicine

## 2018-08-02 ENCOUNTER — Ambulatory Visit (INDEPENDENT_AMBULATORY_CARE_PROVIDER_SITE_OTHER): Payer: Medicaid Other | Admitting: Family Medicine

## 2018-08-02 ENCOUNTER — Other Ambulatory Visit: Payer: Self-pay

## 2018-08-02 ENCOUNTER — Telehealth: Payer: Self-pay | Admitting: *Deleted

## 2018-08-02 VITALS — BP 104/60 | HR 94 | Ht 67.0 in | Wt 213.1 lb

## 2018-08-02 DIAGNOSIS — F339 Major depressive disorder, recurrent, unspecified: Secondary | ICD-10-CM | POA: Diagnosis not present

## 2018-08-02 DIAGNOSIS — R600 Localized edema: Secondary | ICD-10-CM | POA: Diagnosis not present

## 2018-08-02 DIAGNOSIS — E66811 Obesity, class 1: Secondary | ICD-10-CM

## 2018-08-02 DIAGNOSIS — E118 Type 2 diabetes mellitus with unspecified complications: Secondary | ICD-10-CM | POA: Diagnosis present

## 2018-08-02 DIAGNOSIS — E1069 Type 1 diabetes mellitus with other specified complication: Secondary | ICD-10-CM | POA: Diagnosis not present

## 2018-08-02 DIAGNOSIS — E669 Obesity, unspecified: Secondary | ICD-10-CM | POA: Diagnosis not present

## 2018-08-02 DIAGNOSIS — R0683 Snoring: Secondary | ICD-10-CM

## 2018-08-02 LAB — CYTOLOGY - PAP
Chlamydia: NEGATIVE
Diagnosis: NEGATIVE
Neisseria Gonorrhea: NEGATIVE

## 2018-08-02 NOTE — Assessment & Plan Note (Signed)
Concern about OSA since her mom has same. Risk factor include her weight. Referral to sleep clinic for evaluation completed. F/U as needed.

## 2018-08-02 NOTE — Telephone Encounter (Signed)
Pt states that she is returning a call to Dr. Gwendlyn Deutscher.  I do not see any documentation of such.  Will forward to PCP.  Christen Bame, CMA

## 2018-08-02 NOTE — Telephone Encounter (Signed)
I did not call her.

## 2018-08-02 NOTE — Assessment & Plan Note (Signed)
Compliant with Psych f/u for counseling and medication. No acute flare.

## 2018-08-02 NOTE — Assessment & Plan Note (Addendum)
Conservative measures discussed. Reduce salt intake, elevated feet. Wear compression socks. Feet looks good today without swelling which is reassuring.

## 2018-08-02 NOTE — Patient Instructions (Signed)
Edema  Edema is when you have too much fluid in your body or under your skin. Edema may make your legs, feet, and ankles swell up. Swelling is also common in looser tissues, like around your eyes. This is a common condition. It gets more common as you get older. There are many possible causes of edema. Eating too much salt (sodium) and being on your feet or sitting for a long time can cause edema in your legs, feet, and ankles. Hot weather may make edema worse. Edema is usually painless. Your skin may look swollen or shiny. Follow these instructions at home:  Keep the swollen body part raised (elevated) above the level of your heart when you are sitting or lying down.  Do not sit still or stand for a long time.  Do not wear tight clothes. Do not wear garters on your upper legs.  Exercise your legs. This can help the swelling go down.  Wear elastic bandages or support stockings as told by your doctor.  Eat a low-salt (low-sodium) diet to reduce fluid as told by your doctor.  Depending on the cause of your swelling, you may need to limit how much fluid you drink (fluid restriction).  Take over-the-counter and prescription medicines only as told by your doctor. Contact a doctor if:  Treatment is not working.  You have heart, liver, or kidney disease and have symptoms of edema.  You have sudden and unexplained weight gain. Get help right away if:  You have shortness of breath or chest pain.  You cannot breathe when you lie down.  You have pain, redness, or warmth in the swollen areas.  You have heart, liver, or kidney disease and get edema all of a sudden.  You have a fever and your symptoms get worse all of a sudden. Summary  Edema is when you have too much fluid in your body or under your skin.  Edema may make your legs, feet, and ankles swell up. Swelling is also common in looser tissues, like around your eyes.  Raise (elevate) the swollen body part above the level of your  heart when you are sitting or lying down.  Follow your doctor's instructions about diet and how much fluid you can drink (fluid restriction). This information is not intended to replace advice given to you by your health care provider. Make sure you discuss any questions you have with your health care provider. Document Released: 06/24/2007 Document Revised: 01/08/2017 Document Reviewed: 01/24/2016 Elsevier Patient Education  2020 [employer]  

## 2018-08-02 NOTE — Progress Notes (Signed)
Subjective:     Patient ID: Barbara Clark, female   DOB: 1975/10/02, 43 y.o.   MRN: [id number]  HPI Foot swelling: C/O B/L feet swelling for more than a year, worse on the right. Denies any pain, no SOB, no chest pain, no urinary symptoms. Depression:Compliant with Pritiq and Psych f/u. Weight:Compliant with exercise  3 days a week walking 5 miles. She also walk a lot at work for about 6 hours. She is compliant with diet plan. Snoring: C/O snoring for many years which is worsening. Denies sleep apnea, she gets fatigue at times during the day.  Current Outpatient Medications on File Prior to Visit  Medication Sig Dispense Refill  . atorvastatin (LIPITOR) 10 MG tablet Take 10 mg by mouth at bedtime.    . benztropine (COGENTIN) 0.5 MG tablet Take 0.5 mg by mouth daily.  2  . desvenlafaxine (PRISTIQ) 50 MG 24 hr tablet Take 50 mg by mouth daily.    . diclofenac sodium (VOLTAREN) 1 % GEL Apply 2 g topically 4 (four) times daily. 100 g 5  . diphenhydrAMINE (ALLERGY) 25 MG tablet Take 25 mg by mouth as needed for itching or allergies.    . fluconazole (DIFLUCAN) 150 MG tablet Take one tablet by mouth then repeat in 3 days. 2 tablet 0  . gabapentin (NEURONTIN) 100 MG capsule Take two to three tablets three times a day. 240 capsule 3  . Insulin Human (INSULIN PUMP) SOLN Inject into the skin. Patient uses novolog 1.6 units/hr in insulin pump.    Marland Kitchen lisinopril (PRINIVIL,ZESTRIL) 5 MG tablet Take 5 mg by mouth daily.  3  . TEGRETOL 200 MG tablet Take 200 mg by mouth 3 (three) times daily.  2   No current facility-administered medications on file prior to visit.    Past Medical History:  Diagnosis Date  . Abnormality of gait 12/26/2012  . Anxiety   . Chronic leg pain 04/27/2014  . Depression   . Diabetes mellitus    Type I  . DKA (diabetic ketoacidosis) ([place]) 09/03/2017  . HSV-2 (herpes simplex virus 2) infection 2012   serology  . Limb weakness 08/04/2013  . Menorrhagia 04/27/2014  . Right leg  weakness 02/20/2014  . Stuttering 02/02/2013     Review of Systems  Respiratory: Negative.   Cardiovascular: Negative.   Gastrointestinal: Negative.   Musculoskeletal:       Leg swelling  Neurological: Negative for dizziness and light-headedness.  All other systems reviewed and are negative.      Objective:   Physical Exam Vitals signs and nursing note reviewed.  Constitutional:      Appearance: She is not ill-appearing.  Cardiovascular:     Rate and Rhythm: Normal rate and regular rhythm.     Heart sounds: Normal heart sounds. No murmur.  Pulmonary:     Effort: Pulmonary effort is normal. No respiratory distress.     Breath sounds: Normal breath sounds. No wheezing.  Abdominal:     General: Abdomen is flat. Bowel sounds are normal. There is no distension.     Palpations: Abdomen is soft. There is no mass.  Musculoskeletal:     Right lower leg: No edema.     Left lower leg: No edema.     Comments: Sensory exam of the left foot is normal with slight reduction of right foot sensation, tested with the monofilament. Good pulses, no lesions or ulcers, good peripheral pulses.   Neurological:     Mental Status: She is alert.  Office Visit from 08/02/2018 in [place]  PHQ-9 Total Score  7    Body mass index is 33.38 kg/m.   Assessment:     Feet edema Depression Obesity Snoring    Plan:     Check problem list.

## 2018-08-02 NOTE — Assessment & Plan Note (Signed)
Doing well with diet and exercise. We will continue same for now. Consider nutrition referral in the future.

## 2018-08-23 ENCOUNTER — Other Ambulatory Visit: Payer: Self-pay

## 2018-08-23 ENCOUNTER — Ambulatory Visit: Payer: Medicaid Other | Admitting: Podiatry

## 2018-08-23 ENCOUNTER — Ambulatory Visit (INDEPENDENT_AMBULATORY_CARE_PROVIDER_SITE_OTHER): Payer: Medicaid Other

## 2018-08-23 ENCOUNTER — Telehealth: Payer: Self-pay | Admitting: *Deleted

## 2018-08-23 ENCOUNTER — Encounter: Payer: Self-pay | Admitting: Podiatry

## 2018-08-23 VITALS — Temp 97.6°F

## 2018-08-23 DIAGNOSIS — M21371 Foot drop, right foot: Secondary | ICD-10-CM

## 2018-08-23 DIAGNOSIS — R202 Paresthesia of skin: Secondary | ICD-10-CM

## 2018-08-23 DIAGNOSIS — R29898 Other symptoms and signs involving the musculoskeletal system: Secondary | ICD-10-CM

## 2018-08-23 DIAGNOSIS — R200 Anesthesia of skin: Secondary | ICD-10-CM

## 2018-08-23 DIAGNOSIS — E1142 Type 2 diabetes mellitus with diabetic polyneuropathy: Secondary | ICD-10-CM | POA: Diagnosis not present

## 2018-08-23 DIAGNOSIS — M79671 Pain in right foot: Secondary | ICD-10-CM

## 2018-08-23 NOTE — Telephone Encounter (Signed)
Faxed referral to [facility] PT for right foot dropfoot.

## 2018-08-23 NOTE — Patient Instructions (Signed)
I am going to order physical therapy for you. They should be calling you to schedule. If you don't hear from them in about a week please give [place] a call.

## 2018-08-23 NOTE — Telephone Encounter (Signed)
-----   Message from Trula Slade, DPM sent at 08/23/2018  9:07 AM EDT ----- Can you please order PT for dropfoot on the right side at [facility]? Thanks.

## 2018-08-24 ENCOUNTER — Other Ambulatory Visit: Payer: Self-pay | Admitting: Podiatry

## 2018-08-24 DIAGNOSIS — M21371 Foot drop, right foot: Secondary | ICD-10-CM

## 2018-08-29 NOTE — Progress Notes (Signed)
Subjective: 43 year old female presents the office today for concerns of swelling to her right foot.  She also has numbness to the right foot.  Previously she was having to walk with a walker or cane and she is no longer doing this she is been improvement however she is noticed the swelling and still having numbness.  She does work 2 jobs and she is on her feet quite a bit.  She is diabetic and her last A1c she reports was 7.3. Denies any systemic complaints such as fevers, chills, nausea, vomiting. No acute changes since last appointment, and no other complaints at this time.   Objective: AAO x3, NAD DP/PT pulses palpable bilaterally, CRT less than 3 seconds Sensation decreased with Semmes Weinstein monofilament bilaterally.  On the right side there does appear to be decreased dorsiflexion of the ankle concerning for foot drop.  She denies any falls.  There is chronic edema bilaterally the right side worse than left.  There is no erythema or warmth.  No areas of tenderness elicited at this time. No open lesions or pre-ulcerative lesions.  No pain with calf compression, swelling, warmth, erythema  Assessment: 43 year old female right foot drop; type 2 diabetes with neuropathy  Plan: -All treatment options discussed with the patient including all alternatives, risks, complications.  -No evidence of acute fracture or stress fracture identified today. -At this point do recommend physical therapy to help work on mobility although she is been making good progress and I last saw her open physical therapy will be helpful as well.  Order will be placed for [facility] outpatient PT -Continue current dose of gabapentin. -Patient encouraged to call the office with any questions, concerns, change in symptoms.   Trula Slade DPM

## 2018-09-06 ENCOUNTER — Ambulatory Visit: Payer: Medicaid Other | Admitting: Family Medicine

## 2018-09-12 ENCOUNTER — Other Ambulatory Visit: Payer: Self-pay

## 2018-09-12 ENCOUNTER — Ambulatory Visit: Payer: Medicaid Other | Attending: Podiatry | Admitting: Physical Therapy

## 2018-09-12 DIAGNOSIS — M79671 Pain in right foot: Secondary | ICD-10-CM | POA: Insufficient documentation

## 2018-09-12 DIAGNOSIS — R262 Difficulty in walking, not elsewhere classified: Secondary | ICD-10-CM | POA: Insufficient documentation

## 2018-09-12 DIAGNOSIS — M6281 Muscle weakness (generalized): Secondary | ICD-10-CM | POA: Diagnosis present

## 2018-09-12 NOTE — Therapy (Signed)
[place], [place], [address] Phone: [phone]   Fax:  [phone]  Physical Therapy Evaluation  Patient Details  Name: Barbara Clark MRN: [id number] Date of Birth: 09-19-1975 Referring Provider (PT): Dr Celesta Gentile   Encounter Date: 09/12/2018  PT End of Session - 09/12/18 0833    Visit Number  1    Number of Visits  4    Date for PT Re-Evaluation  10/10/18    Authorization Type  MCD - request sent    PT Start Time  0833    PT Stop Time  0910    PT Time Calculation (min)  37 min    Activity Tolerance  Patient tolerated treatment well       Past Medical History:  Diagnosis Date  . Abnormality of gait 12/26/2012  . Anxiety   . Chronic leg pain 04/27/2014  . Depression   . Diabetes mellitus    Type I  . DKA (diabetic ketoacidosis) ([place]) 09/03/2017  . HSV-2 (herpes simplex virus 2) infection 2012   serology  . Limb weakness 08/04/2013  . Menorrhagia 04/27/2014  . Right leg weakness 02/20/2014  . Stuttering 02/02/2013    Past Surgical History:  Procedure Laterality Date  . TUBAL LIGATION      There were no vitals filed for this visit.   Subjective Assessment - 09/12/18 0833    Subjective  Pt reports she is having Rt foot pain, numbness andtingling that started in Nov of last year.  Initially she had swelling and pain, was walking 5 miles a day, she is not walking now as she has two jobs. 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 She feels more pain after driving or prolonged sitting then standing. Has started wearing compression socks    Pertinent History  DM, Rt carpal tunnel, Rt ankle fx as a teenager - 3 places    How long can you walk comfortably?  no limitations.    Diagnostic tests  x-rays (-) for fx    Patient Stated Goals  get swelling and pain out. Not have to use a cane again.    Currently in Pain?  No/denies    Pain Orientation  Right    Pain Descriptors  / Indicators  Numbness;Pins and needles    Pain Type  Chronic pain    Pain Onset  More than a month ago    Pain Frequency  Intermittent    Aggravating Factors   putting weight on it after prolonged sitting.    Pain Relieving Factors  not sure once it starts to hurts - she tries to ignore it.         [facility] PT Assessment - 09/12/18 0001      Assessment   Medical Diagnosis  Rt foot pain, numbness and tingling    Referring Provider (PT)  Dr Celesta Gentile    Onset Date/Surgical Date  12/12/17    Hand Dominance  Right    Next MD Visit  6 weeks    Prior Therapy  yes      Precautions   Precaution Comments  get good shoes and wear compression socks.       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    Leisure  read, watch sports      Observation/Other Assessments   Focus on Therapeutic Outcomes (FOTO)   not performed d/t MCD      Sensation   Hot/Cold  Impaired by gross assessment    Proprioception  Impaired by gross assessment      Functional Tests   Functional tests  Single leg stance      Single Leg Stance   Comments  Lt > 15 sec, Rt < 1sec       Posture/Postural Control   Posture Comments  pes planus bilat      Deep Tendon Reflexes   DTR Assessment Site  Patella;Achilles    Patella DTR  1+    Achilles DTR  0      ROM / Strength   AROM / PROM / Strength  AROM;PROM;Strength      AROM   AROM Assessment Site  Ankle    Right/Left Ankle  Left;Right    Right Ankle Dorsiflexion  -10    Right Ankle Plantar Flexion  32    Right Ankle Inversion  12    Right Ankle Eversion  5    Left Ankle Dorsiflexion  15    Left Ankle Plantar Flexion  40    Left Ankle Inversion   25    Left Ankle Eversion  17      PROM   Overall PROM Comments  Rt great toe extension WNL    PROM Assessment Site  Ankle    Right/Left Ankle  Right    Right Ankle Dorsiflexion  17    Right Ankle Plantar Flexion  45    Right Ankle Inversion  37    Right Ankle Eversion  20      Strength   Strength Assessment Site  Hip;Knee;Ankle    Right/Left Hip  Right   Lt WNL   Right Hip Flexion  3-/5    Right Hip Extension  4-/5    Right Hip ABduction  4-/5    Right/Left Knee  Right   Lt WNL   Right Knee Flexion  3+/5    Right Knee Extension  4-/5    Right/Left Ankle  Right   Lt WNL   Right Ankle Dorsiflexion  3/5    Right Ankle Plantar Flexion  3+/5    Right Ankle Inversion  3+/5    Right Ankle Eversion  3+/5      Palpation   Palpation comment  no pain with palpation in Rt foot - pt is numb                 Objective measurements completed on examination: See above findings.      [facility] Adult PT Treatment/Exercise - 09/12/18 0001      Exercises   Exercises  Ankle      Ankle Exercises: Standing   SLS  rt      Ankle Exercises: Seated   ABC's  1 rep    Ankle Circles/Pumps  Strengthening;10 reps    Towel Crunch  5 reps             PT Education - 09/12/18 0909    Education Details  HEP    Person(s) Educated  Patient    Methods  Explanation;Demonstration;Handout  Comprehension  Returned demonstration;Verbalized understanding       PT Short Term Goals - 09/12/18 1308      PT SHORT TERM GOAL #1   Title  Patient will increase right hip flexion strength to 4+/5 ( 10/10/2018)    Baseline  3- to 4-/5 throughout Rt hip    Time  4    Period  Weeks    Status  New    Target Date  10/10/18      PT SHORT TERM GOAL #2   Title  improve Rt SLS =/> 10 sec ( 10/10/2018)    Baseline  < 1 sec Rt SLS    Time  4    Period  Weeks    Status  New    Target Date  10/10/18      PT SHORT TERM GOAL #3   Title  improve rt knee and ankle strength =/> 4/5 throughout (  10/10/2018)    Baseline  varies 3- to 4/5    Time  4    Period  Weeks    Status  New    Target Date  10/10/18      PT SHORT TERM GOAL #4   Title  I with initial HEP ( 10/10/2018)    Baseline  has stopped exercising due to pain/numbness and swelling    Time  4    Period  Weeks    Status  New    Target Date  10/10/18        PT Long Term Goals - 09/12/18 1311      PT LONG TERM GOAL #1   Title  to be set at reassessment if needed ( 10/10/2018)    Time  4    Period  Weeks    Status  New    Target Date  10/10/18             Plan - 09/12/18 1217    Clinical Impression Statement  43 yo female with complicated medical history as relates to her Rt LE issues.  She has weakness throughout the whole Rt LE however per her report back evaluations and nerve conduction tests are inconclusive.  She also has diminished/absent reflexes in the Rt knee and foot. MD contribute Rt foot numbness to DM.  Rt LE ROM is WNL. Proproception and balance impaired as well on the Rt side.  She would benefit from PT to improve her strength and work on balance.  She does not want to have to use a cane again.Pt was encouraged to get compression socks that go up to her knee and not the ones she has that stop at her ankle.    Personal Factors and Comorbidities  Comorbidity 2    Examination-Activity Limitations  Stand    Examination-Participation Restrictions  Other    Stability/Clinical Decision Making  Stable/Uncomplicated    Clinical Decision Making  Low    Rehab Potential  Good    PT Frequency  1x / week    PT Duration  4 weeks    PT Treatment/Interventions  Functional mobility training;Patient/family education;Moist Heat;Ultrasound;Therapeutic exercise;Dry needling;Balance training;Cryotherapy;Electrical Stimulation;Neuromuscular re-education;Manual techniques    PT Next Visit Plan  LE strengthening and balance    Consulted and Agree with Plan of Care  Patient       Patient will benefit from skilled  therapeutic intervention in order to improve the following deficits and impairments:  Difficulty walking, Decreased balance, Other (comment), Decreased strength  Visit Diagnosis: Pain in  right foot - Plan: PT plan of care cert/re-cert  Muscle weakness (generalized) - Plan: PT plan of care cert/re-cert  Difficulty in walking, not elsewhere classified - Plan: PT plan of care cert/re-cert     Problem List Patient Active Problem List   Diagnosis Date Noted  . Edema of both feet 08/02/2018  . Snoring 08/02/2018  . Internal bleeding hemorrhoids 07/31/2018  . Right wrist pain 04/21/2018  . Edema of hands 03/21/2018  . Obesity (BMI 30-39.9) 10/12/2017  . Back pain, lumbosacral 07/06/2017  . Right hip pain 10/29/2015  . 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 DM neuropathy, type I diabetes mellitus ([place]) 03/13/2014  . Vaginal Pap smear 08/21/2010  . TOBACCO USER 10/27/2008  . Diabetes mellitus type I ([place]) 03/18/2006  . Major depressive disorder, recurrent episode ([place]) 03/18/2006  . Migraine headache 03/18/2006    Jeral Pinch PT 09/12/2018, 1:14 PM  [facility] [address] [place], [place], [address] Phone: [phone]   Fax:  [phone]  Name: Barbara Clark MRN: [id number] Date of Birth: Mar 27, 1975

## 2018-09-30 ENCOUNTER — Other Ambulatory Visit: Payer: Self-pay

## 2018-09-30 ENCOUNTER — Encounter: Payer: Self-pay | Admitting: Physical Therapy

## 2018-09-30 ENCOUNTER — Ambulatory Visit: Payer: Medicaid Other | Attending: Podiatry | Admitting: Physical Therapy

## 2018-09-30 DIAGNOSIS — R296 Repeated falls: Secondary | ICD-10-CM

## 2018-09-30 DIAGNOSIS — M6281 Muscle weakness (generalized): Secondary | ICD-10-CM

## 2018-09-30 DIAGNOSIS — M79671 Pain in right foot: Secondary | ICD-10-CM | POA: Diagnosis not present

## 2018-09-30 DIAGNOSIS — R262 Difficulty in walking, not elsewhere classified: Secondary | ICD-10-CM | POA: Diagnosis present

## 2018-09-30 DIAGNOSIS — R2689 Other abnormalities of gait and mobility: Secondary | ICD-10-CM | POA: Diagnosis present

## 2018-09-30 NOTE — Therapy (Signed)
[place], [place], [address] Phone: [phone]   Fax:  [phone]  Physical Therapy Treatment  Patient Details  Name: Barbara Clark MRN: [id number] Date of Birth: 22-Jul-1975 Referring Provider (PT): Dr Celesta Gentile   Encounter Date: 09/30/2018  PT End of Session - 09/30/18 0756    Visit Number  2    Number of Visits  4    Date for PT Re-Evaluation  10/10/18    Authorization Type  3 visits 9/3 through 9/23    Authorization - Visit Number  1    Authorization - Number of Visits  3    PT Start Time  0756    PT Stop Time  0838    PT Time Calculation (min)  42 min    Activity Tolerance  Patient tolerated treatment well    Behavior During Therapy  [facility] for tasks assessed/performed       Past Medical History:  Diagnosis Date  . Abnormality of gait 12/26/2012  . Anxiety   . Chronic leg pain 04/27/2014  . Depression   . Diabetes mellitus    Type I  . DKA (diabetic ketoacidosis) ([place]) 09/03/2017  . HSV-2 (herpes simplex virus 2) infection 2012   serology  . Limb weakness 08/04/2013  . Menorrhagia 04/27/2014  . Right leg weakness 02/20/2014  . Stuttering 02/02/2013    Past Surgical History:  Procedure Laterality Date  . TUBAL LIGATION      There were no vitals filed for this visit.  Subjective Assessment - 09/30/18 0757    Subjective  Pt reports she is doing well with her HEP, sees MD on Tuesday and is going to ask for compression socks then.    Currently in Pain?  No/denies         [facility] PT Assessment - 09/30/18 0001      Assessment   Medical Diagnosis  Rt foot pain, numbness and tingling    Referring Provider (PT)  Dr Celesta Gentile      Single Leg Stance   Comments  Rt 4 sec                   [facility] Adult PT Treatment/Exercise - 09/30/18 0001      Exercises   Exercises  Ankle      Ankle Exercises: Stretches   Gastroc Stretch  2 reps;30 seconds   each side with slant  board   Other Stretch  lateral hip, HS and anterior tib       Ankle Exercises: Aerobic   Nustep  L5x5'      Ankle Exercises: Machines for Strengthening   Cybex Leg Press  2 plate for hip flexion   hip cybex, 3 plates 2x10 all directions each side     Ankle Exercises: Standing   SLS  each side at rebounder , straight and 45 degree angle each side      Ankle Exercises: Seated   Other Seated Ankle Exercises  2x30 , green band Rt foot dorsiflexion, bilat eversion               PT Short Term Goals - 09/30/18 0800      PT SHORT TERM GOAL #1   Title  Patient will increase right hip flexion strength to 4+/5 ( 10/10/2018)    Status  On-going      PT SHORT TERM GOAL #2   Title  improve Rt SLS =/> 10 sec ( 10/10/2018)  Status  On-going      PT SHORT TERM GOAL #3   Title  improve rt knee and ankle strength =/> 4/5 throughout ( 10/10/2018)    Status  On-going      PT SHORT TERM GOAL #4   Title  I with initial HEP ( 10/10/2018)    Status  On-going        PT Long Term Goals - 09/30/18 0800      PT LONG TERM GOAL #1   Title  to be set at reassessment if needed ( 10/10/2018)            Plan - 09/30/18 0814    Clinical Impression Statement  This is Lakera's second visit, she is doing well with current HEP. Demonstrated improved SLS Rt to 4 sec. Making progress to her goals.  She would benefit from continued tx to work on lower body strengthening and balance. She has returned to the gym now that it is open again.    PT Frequency  1x / week    PT Duration  4 weeks    PT Treatment/Interventions  Functional mobility training;Patient/family education;Moist Heat;Ultrasound;Therapeutic exercise;Dry needling;Balance training;Cryotherapy;Electrical Stimulation;Neuromuscular re-education;Manual techniques    PT Next Visit Plan  progress HEP    Consulted and Agree with Plan of Care  Patient       Patient will benefit from skilled therapeutic intervention in order to improve the  following deficits and impairments:  Difficulty walking, Decreased balance, Other (comment), Decreased strength  Visit Diagnosis: Pain in right foot  Muscle weakness (generalized)  Difficulty in walking, not elsewhere classified  Other abnormalities of gait and mobility  Repeated falls     Problem List Patient Active Problem List   Diagnosis Date Noted  . Edema of both feet 08/02/2018  . Snoring 08/02/2018  . Internal bleeding hemorrhoids 07/31/2018  . Right wrist pain 04/21/2018  . Edema of hands 03/21/2018  . Obesity (BMI 30-39.9) 10/12/2017  . Back pain, lumbosacral 07/06/2017  . Right hip pain 10/29/2015  . 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 DM neuropathy, type I diabetes mellitus ([place]) 03/13/2014  . Vaginal Pap smear 08/21/2010  . TOBACCO USER 10/27/2008  . Diabetes mellitus type I ([place]) 03/18/2006  . Major depressive disorder, recurrent episode ([place]) 03/18/2006  . Migraine headache 03/18/2006    Jeral Pinch PT  09/30/2018, 8:39 AM  [facility] [address] [place], [place], [address] Phone: [phone]   Fax:  [phone]  Name: Cather Torgerson MRN: [id number] Date of Birth: 1975-09-23

## 2018-10-03 ENCOUNTER — Ambulatory Visit: Payer: Medicaid Other | Admitting: Physical Therapy

## 2018-10-03 ENCOUNTER — Other Ambulatory Visit: Payer: Self-pay

## 2018-10-03 ENCOUNTER — Encounter: Payer: Self-pay | Admitting: Physical Therapy

## 2018-10-03 DIAGNOSIS — R262 Difficulty in walking, not elsewhere classified: Secondary | ICD-10-CM

## 2018-10-03 DIAGNOSIS — M79671 Pain in right foot: Secondary | ICD-10-CM

## 2018-10-03 DIAGNOSIS — R296 Repeated falls: Secondary | ICD-10-CM

## 2018-10-03 DIAGNOSIS — M6281 Muscle weakness (generalized): Secondary | ICD-10-CM

## 2018-10-03 DIAGNOSIS — R2689 Other abnormalities of gait and mobility: Secondary | ICD-10-CM

## 2018-10-03 NOTE — Therapy (Signed)
Barbara [place], [place], [address] Phone: [phone]   Fax:  [phone]  Physical Therapy Treatment  Patient Details  Name: Barbara Clark MRN: [id number] Date of Birth: 1975-04-27 Referring Provider (PT): Dr Celesta Gentile   Encounter Date: 10/03/2018  PT End of Session - 10/03/18 0807    Visit Number  3    Number of Visits  4    Date for PT Re-Evaluation  10/10/18    Authorization Type  3 visits 9/3 through 9/23    Authorization - Visit Number  2    Authorization - Number of Visits  3    PT Start Time  0803    PT Stop Time  [id number]    PT Time Calculation (min)  40 min       Past Medical History:  Diagnosis Date  . Abnormality of gait 12/26/2012  . Anxiety   . Chronic leg pain 04/27/2014  . Depression   . Diabetes mellitus    Type I  . DKA (diabetic ketoacidosis) ([place]) 09/03/2017  . HSV-2 (herpes simplex virus 2) infection 2012   serology  . Limb weakness 08/04/2013  . Menorrhagia 04/27/2014  . Right leg weakness 02/20/2014  . Stuttering 02/02/2013    Past Surgical History:  Procedure Laterality Date  . TUBAL LIGATION      There were no vitals filed for this visit.      [facility] PT Assessment - 10/03/18 0001      Assessment   Medical Diagnosis  Rt foot pain, numbness and tingling    Referring Provider (PT)  Dr Celesta Gentile      AROM   Right Ankle Dorsiflexion  10    Left Ankle Dorsiflexion  15                   [facility] Adult PT Treatment/Exercise - 10/03/18 0001      Exercises   Exercises  Ankle      Ankle Exercises: Aerobic   Nustep  L6x5'      Ankle Exercises: Standing   SLS  10 reps, each side, toe taps FWD/BWD/Side, FWD/BWD kicks - VC for form    Heel Raises  Both;10 reps   each toes straight, out, in     Ankle Exercises: Stretches   Gastroc Stretch  1 rep;30 seconds    Slant Board Stretch  1 rep;30 seconds    Other Stretch  figure 4 stretches, prone quad stretch  d/t reports of tightness, HS stretch with strap in supine      Ankle Exercises: Supine   T-Band  3x10 red band DF each side    Other Supine Ankle Exercises  2x10 single leg bridges.       Ankle Exercises: Sidelying   Other Sidelying Ankle Exercises  10 reps each leg, CW/CCW circles, FWD/BWD kicks             PT Education - 10/03/18 0820    Education Details  HEP progression    Person(s) Educated  Patient    Methods  Explanation;Demonstration;Handout    Comprehension  Returned demonstration;Verbalized understanding       PT Short Term Goals - 09/30/18 0800      PT SHORT TERM GOAL #1   Title  Patient will increase right hip flexion strength to 4+/5 ( 10/10/2018)    Status  On-going      PT SHORT TERM GOAL #2   Title  improve Rt SLS =/>  10 sec ( 10/10/2018)    Status  On-going      PT SHORT TERM GOAL #3   Title  improve rt knee and ankle strength =/> 4/5 throughout ( 10/10/2018)    Status  On-going      PT SHORT TERM GOAL #4   Title  I with initial HEP ( 10/10/2018)    Status  On-going        PT Long Term Goals - 09/30/18 0800      PT LONG TERM GOAL #1   Title  to be set at reassessment if needed ( 10/10/2018)            Plan - 10/03/18 0847    Clinical Impression Statement  [name] continues to do well with her exercise and did well with progression in her HEP.  She is making progress to her goals.  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 she sees MD tomorrow and will ask for knee high compression socks.    Rehab Potential  Good    PT Frequency  1x / week    PT Duration  4 weeks    PT Treatment/Interventions  Functional mobility training;Patient/family education;Moist Heat;Ultrasound;Therapeutic exercise;Dry needling;Balance training;Cryotherapy;Electrical Stimulation;Neuromuscular re-education;Manual techniques    PT Next Visit Plan   reassess for more visits    Consulted and Agree with Plan of Care  Patient       Patient will benefit from skilled therapeutic intervention in order to improve the following deficits and impairments:  Difficulty walking, Decreased balance, Other (comment), Decreased strength  Visit Diagnosis: Pain in right foot  Muscle weakness (generalized)  Difficulty in walking, not elsewhere classified  Other abnormalities of gait and mobility  Repeated falls     Problem List Patient Active Problem List   Diagnosis Date Noted  . Edema of both feet 08/02/2018  . Snoring 08/02/2018  . Internal bleeding hemorrhoids 07/31/2018  . Right wrist pain 04/21/2018  . Edema of hands 03/21/2018  . Obesity (BMI 30-39.9) 10/12/2017  . Back pain, lumbosacral 07/06/2017  . Right hip pain 10/29/2015  . 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 DM neuropathy, type I diabetes mellitus ([place]) 03/13/2014  . Vaginal Pap smear 08/21/2010  . TOBACCO USER 10/27/2008  . Diabetes mellitus type I ([place]) 03/18/2006  . Major depressive disorder, recurrent episode ([place]) 03/18/2006  . Migraine headache 03/18/2006    Manuela Schwartz Sashia Campas PT  10/03/2018, 8:50 AM  [facility] [address] [place], [place], [address] Phone: [phone]   Fax:  [phone]  Name: Barbara Clark MRN: [id number] Date of Birth: 05-Feb-1975

## 2018-10-04 ENCOUNTER — Ambulatory Visit: Payer: Medicaid Other | Admitting: Podiatry

## 2018-10-04 ENCOUNTER — Telehealth: Payer: Self-pay | Admitting: *Deleted

## 2018-10-04 DIAGNOSIS — R609 Edema, unspecified: Secondary | ICD-10-CM

## 2018-10-04 DIAGNOSIS — I872 Venous insufficiency (chronic) (peripheral): Secondary | ICD-10-CM

## 2018-10-04 DIAGNOSIS — M722 Plantar fascial fibromatosis: Secondary | ICD-10-CM | POA: Diagnosis not present

## 2018-10-04 DIAGNOSIS — E1142 Type 2 diabetes mellitus with diabetic polyneuropathy: Secondary | ICD-10-CM | POA: Diagnosis not present

## 2018-10-04 NOTE — Telephone Encounter (Signed)
-----   Message from Trula Slade, DPM sent at 10/04/2018  8:47 AM EDT ----- Can you please order a venous reflux study due to swelling? Thanks.

## 2018-10-04 NOTE — Telephone Encounter (Signed)
[place] VENOUS REFLUX B/L [id number], AUTHORIZATIONOM:[id number], EFFECTIVE:  10/04/2018, END: 04/02/2018. Faxed to [facility].

## 2018-10-04 NOTE — Progress Notes (Signed)
Subjective: 43 year old female presents the office today for follow-up evaluation of swelling as well as neuropathy.  She also gets some pain in the arch of her foot on the right side at times.  She previously had inserts, power steps but she is not been wearing them.  She is purchased new shoes as well as doing physical therapy which is been helpful as well the gabapentin has been effective. Denies any systemic complaints such as fevers, chills, nausea, vomiting. No acute changes since last appointment, and no other complaints at this time.   Objective: AAO x3, NAD DP/PT pulses palpable bilaterally, CRT less than 3 seconds Sensation decreased with Semmes Weinstein monofilament.  Not able to elicit any area tenderness today but subjectively shooting discomfort on the arch of the foot on the plantar fashion on the right side.  No pain today.  Plantar fascia appears to be intact.  Achilles tendon appears to be intact.  Bilateral chronic edema is present but there is no pain with calf compression, erythema or warmth.   No open lesions.  Assessment: Bilateral lower extremity edema; diabetic neuropathy; plantar fasciitis  Plan: -All treatment options discussed with the patient including all alternatives, risks, complications.  -Prescription for compression socks was given today.  Also order venous reflux study.  Continue with speech and she is in the physical therapy.  I will go back to the inserts to help with the arch of the foot, plan fasciitis discomfort.  Continue current dose of gabapentin. -Patient encouraged to call the office with any questions, concerns, change in symptoms.   RTC 2 months or sooner if needed  Trula Slade DPM

## 2018-10-07 ENCOUNTER — Ambulatory Visit (INDEPENDENT_AMBULATORY_CARE_PROVIDER_SITE_OTHER): Payer: Medicaid Other | Admitting: Family Medicine

## 2018-10-07 ENCOUNTER — Other Ambulatory Visit: Payer: Self-pay

## 2018-10-07 ENCOUNTER — Encounter: Payer: Self-pay | Admitting: Family Medicine

## 2018-10-07 VITALS — BP 112/60 | HR 83 | Ht 67.0 in | Wt 217.4 lb

## 2018-10-07 DIAGNOSIS — E1040 Type 1 diabetes mellitus with diabetic neuropathy, unspecified: Secondary | ICD-10-CM | POA: Diagnosis not present

## 2018-10-07 DIAGNOSIS — N898 Other specified noninflammatory disorders of vagina: Secondary | ICD-10-CM

## 2018-10-07 DIAGNOSIS — R600 Localized edema: Secondary | ICD-10-CM | POA: Diagnosis not present

## 2018-10-07 DIAGNOSIS — Z23 Encounter for immunization: Secondary | ICD-10-CM

## 2018-10-07 LAB — POCT URINALYSIS DIP (MANUAL ENTRY)
Bilirubin, UA: NEGATIVE
Blood, UA: NEGATIVE
Glucose, UA: NEGATIVE mg/dL
Ketones, POC UA: NEGATIVE mg/dL
Leukocytes, UA: NEGATIVE
Nitrite, UA: NEGATIVE
Protein Ur, POC: NEGATIVE mg/dL
Spec Grav, UA: 1.01 (ref 1.010–1.025)
Urobilinogen, UA: 0.2 E.U./dL
pH, UA: 7 (ref 5.0–8.0)

## 2018-10-07 NOTE — Assessment & Plan Note (Signed)
Currently no swelling. ?? Vascular insufficiency from excessive standing. S/O Podiatry eval who recommended xray. This has been scheduled by them per the patient. I recommended limiting salt intake and taking time to sit and elevate her LL for couple of minutes per day. Continue compression stockings as needed. F/U if there is no improvement.

## 2018-10-07 NOTE — Patient Instructions (Addendum)
It was nice seeing you today. Your urine looks good. Please keep yourself well hydrated. If you have any vaginal discharge, please let us know.      Edema  Edema is when you have too much fluid in your body or under your skin. Edema may make your legs, feet, and ankles swell up. Swelling is also common in looser tissues, like around your eyes. This is a common condition. It gets more common as you get older. There are many possible causes of edema. Eating too much salt (sodium) and being on your feet or sitting for a long time can cause edema in your legs, feet, and ankles. Hot weather may make edema worse. Edema is usually painless. Your skin may look swollen or shiny. Follow these instructions at home:  Keep the swollen body part raised (elevated) above the level of your heart when you are sitting or lying down.  Do not sit still or stand for a long time.  Do not wear tight clothes. Do not wear garters on your upper legs.  Exercise your legs. This can help the swelling go down.  Wear elastic bandages or support stockings as told by your doctor.  Eat a low-salt (low-sodium) diet to reduce fluid as told by your doctor.  Depending on the cause of your swelling, you may need to limit how much fluid you drink (fluid restriction).  Take over-the-counter and prescription medicines only as told by your doctor. Contact a doctor if:  Treatment is not working.  You have heart, liver, or kidney disease and have symptoms of edema.  You have sudden and unexplained weight gain. Get help right away if:  You have shortness of breath or chest pain.  You cannot breathe when you lie down.  You have pain, redness, or warmth in the swollen areas.  You have heart, liver, or kidney disease and get edema all of a sudden.  You have a fever and your symptoms get worse all of a sudden. Summary  Edema is when you have too much fluid in your body or under your skin.  Edema may make your legs,  feet, and ankles swell up. Swelling is also common in looser tissues, like around your eyes.  Raise (elevate) the swollen body part above the level of your heart when you are sitting or lying down.  Follow your doctor's instructions about diet and how much fluid you can drink (fluid restriction). This information is not intended to replace advice given to you by your health care provider. Make sure you discuss any questions you have with your health care provider. Document Released: 06/24/2007 Document Revised: 01/08/2017 Document Reviewed: 01/24/2016 Elsevier Patient Education  2020 [employer].

## 2018-10-07 NOTE — Progress Notes (Signed)
Subjective:     Patient ID: Barbara Clark, female   DOB: [birth year], 43 y.o.   MRN: [id number]  Dysuria  This is a new (Fishy smell and irritation after urination but no pain) problem. The current episode started 1 to 4 weeks ago (Started 2 weeks ago). The problem occurs every urination. The problem has been gradually worsening. The pain is at a severity of 0/10. The patient is experiencing no pain. There has been no fever. She is sexually active. There is no history of pyelonephritis. Associated symptoms include frequency. Pertinent negatives include no discharge, hematuria, hesitancy, nausea, possible pregnancy, urgency or vomiting. Treatments tried: Water and cranberry juice. The treatment provided mild relief.  Leg edema: Started about 1 year ago. B/L swelling with pain on the right foot and less pain on the left. No recent injury to her feet. Seen by her foot doctor recently and they recommended xray. 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 No SOB or chest pain. She uses salt in her diet as regular. At times she craves salt. DM2: On insulin pump. She had recent endo f/u last week. No concern.  Current Outpatient Medications on File Prior to Visit  Medication Sig Dispense Refill  . atorvastatin (LIPITOR) 10 MG tablet Take 10 mg by mouth at bedtime.    Marland Kitchen desvenlafaxine (PRISTIQ) 50 MG 24 hr tablet Take 50 mg by mouth daily.    Marland Kitchen gabapentin (NEURONTIN) 100 MG capsule Take two to three tablets three times a day. 240 capsule 3  . insulin aspart (NOVOLOG) 100 UNIT/ML injection USE VIA INSULIN PUMP (MAX 100 UNITS DAILY) 30 DAYS SUBCUTANEOUS 30 DAYS    . Insulin Human (INSULIN PUMP) SOLN Inject into the skin. Patient uses novolog 1.6 units/hr in insulin pump.    Marland Kitchen lisinopril (PRINIVIL,ZESTRIL) 5 MG tablet Take 5 mg by mouth daily.  3  . Multiple Vitamin (MULTIVITAMIN) capsule Take 1 capsule by mouth daily.    . TEGRETOL 200 MG tablet Take 200 mg by mouth 3  (three) times daily.  2   No current facility-administered medications on file prior to visit.    Past Medical History:  Diagnosis Date  . Abnormality of gait 12/26/2012  . Anxiety   . Chronic leg pain 04/27/2014  . Depression   . Diabetes mellitus    Type I  . DKA (diabetic ketoacidosis) ([place]) 09/03/2017  . HSV-2 (herpes simplex virus 2) infection 2012   serology  . Limb weakness 08/04/2013  . Menorrhagia 04/27/2014  . Right leg weakness 02/20/2014  . Stuttering 02/02/2013   Vitals:   10/07/18 0831  BP: 112/60  Pulse: 83  SpO2: 98%  Weight: 217 lb 6 oz (98.6 kg)  Height: 5\' 7"  (1.702 m)      Review of Systems  Respiratory: Negative.   Cardiovascular: Negative.   Gastrointestinal: Negative for nausea and vomiting.  Genitourinary: Positive for dysuria and frequency. Negative for hematuria, hesitancy and urgency.  All other systems reviewed and are negative.      Objective:   Physical Exam Vitals signs and nursing note reviewed.  Constitutional:      Appearance: Normal appearance.  Cardiovascular:     Rate and Rhythm: Normal rate and regular rhythm.     Heart sounds: Normal heart sounds. No murmur.  Pulmonary:     Effort: Pulmonary effort is normal. No respiratory distress.     Breath sounds: Normal breath sounds. No stridor. No  wheezing or rhonchi.  Abdominal:     General: Abdomen is flat. Bowel sounds are normal. There is no distension.     Palpations: There is no mass.     Tenderness: There is no abdominal tenderness.     Hernia: No hernia is present.  Musculoskeletal:        General: No tenderness.     Right lower leg: No edema.     Left lower leg: No edema.     Comments: Compression stockings on the right foot although no swelling.  Neurological:     Mental Status: She is alert.       Assessment:     Urine symptoms Feet edema DM2    Plan:     Check problem list.  Urine symptoms: Normal UA with negative nitrate and leukocyte. ?? Due to  dehydration. She denies vaginal discharge. Hydration recommended. F/U soon if symptoms persists. She agreed with the plan.

## 2018-10-07 NOTE — Assessment & Plan Note (Signed)
Compliant with treatment plan with endo. She signed ROI for A1C report.

## 2018-10-11 ENCOUNTER — Encounter: Payer: Self-pay | Admitting: Physical Therapy

## 2018-10-11 ENCOUNTER — Ambulatory Visit: Payer: Medicaid Other | Admitting: Physical Therapy

## 2018-10-11 ENCOUNTER — Other Ambulatory Visit: Payer: Self-pay

## 2018-10-11 DIAGNOSIS — M79671 Pain in right foot: Secondary | ICD-10-CM | POA: Diagnosis not present

## 2018-10-11 DIAGNOSIS — R262 Difficulty in walking, not elsewhere classified: Secondary | ICD-10-CM

## 2018-10-11 DIAGNOSIS — M6281 Muscle weakness (generalized): Secondary | ICD-10-CM

## 2018-10-11 NOTE — Therapy (Signed)
[place], [place], [address] Phone: [phone]   Fax:  [phone]  Physical Therapy Treatment  Patient Details  Name: Barbara Clark MRN: [id number] Date of Birth: 12/03/1975 Referring Provider (PT): Dr Celesta Gentile   Encounter Date: 10/11/2018  PT End of Session - 10/11/18 0830    Visit Number  4    Number of Visits  4    Date for PT Re-Evaluation  10/10/18    Authorization Type  3 visits 9/3 through 9/23 will re-submit    Authorization - Visit Number  2    Authorization - Number of Visits  3    PT Start Time  0800    PT Stop Time  0840    PT Time Calculation (min)  40 min    Activity Tolerance  Patient tolerated treatment well    Behavior During Therapy  [facility] for tasks assessed/performed       Past Medical History:  Diagnosis Date  . Abnormality of gait 12/26/2012  . Anxiety   . Chronic leg pain 04/27/2014  . Depression   . Diabetes mellitus    Type I  . DKA (diabetic ketoacidosis) ([place]) 09/03/2017  . HSV-2 (herpes simplex virus 2) infection 2012   serology  . Limb weakness 08/04/2013  . Menorrhagia 04/27/2014  . Right leg weakness 02/20/2014  . Stuttering 02/02/2013    Past Surgical History:  Procedure Laterality Date  . TUBAL LIGATION      There were no vitals filed for this visit.  Subjective Assessment - 10/11/18 0811    Subjective  Patient reports 2 days agfo her foot began hurting. She thinks she over did it with the exercises. The pain was near a 10/10 this morning. She will be going to a cardio-vascular specialist to look at her circulation. She took Ibuporfin which helped.    Pertinent History  DM, Rt carpal tunnel, Rt ankle fx as a teenager - 3 places    Currently in Pain?  Yes    Pain Score  8     Pain Location  Ankle    Pain Orientation  Right    Pain Descriptors / Indicators  Aching    Pain Type  Chronic pain    Pain Onset  More than a month ago    Pain Frequency   Intermittent    Aggravating Factors   putting weight on the ankle    Pain Relieving Factors  Not sure once it starts    Multiple Pain Sites  No         [facility] PT Assessment - 10/11/18 0001      AROM   Right Ankle Dorsiflexion  -5    Right Ankle Plantar Flexion  30    Right Ankle Inversion  12    Right Ankle Eversion  3      Strength   Right Ankle Dorsiflexion  3/5    Right Ankle Plantar Flexion  --   not tested 2qnd to pain    Right Ankle Inversion  3/5    Right Ankle Eversion  3/5                   [facility] Adult PT Treatment/Exercise - 10/11/18 0001      Modalities   Modalities  Ultrasound      Ultrasound   Ultrasound Location  to right medial and lateral malleolus and sub-talor area     Ultrasound Parameters  1.5ww/cm2 3.3 mhz  cont     Ultrasound Goals  Pain;Edema      Manual Therapy   Manual Therapy  Soft tissue mobilization;Edema management;Passive ROM    Edema Management  gentle edema massage    Soft tissue mobilization  to posterior calf     Passive ROM  gentle into DF. Increased pain at first but improved with stretching       Ankle Exercises: Stretches   Gastroc Stretch  1 rep;30 seconds      Ankle Exercises: Supine   Other Supine Ankle Exercises  x15 4 way AROM limited by pain       Ankle Exercises: Aerobic   Nustep  unable today 2nd to significant pain              PT Education - 10/11/18 0829    Education Details  use of ultrasound    Person(s) Educated  Patient    Methods  Demonstration;Verbal cues;Tactile cues;Explanation    Comprehension  Verbalized understanding;Returned demonstration       PT Short Term Goals - 10/11/18 0840      PT SHORT TERM GOAL #1   Title  Patient will increase right hip flexion strength to 4+/5 ( 10/10/2018)    Baseline  stringth had improved but strength decreased today 2nd to severe pain    Time  4    Period  Weeks    Status  On-going    Target Date  10/10/18      PT SHORT TERM GOAL #2   Title   improve Rt SLS =/> 10 sec ( 10/10/2018)    Baseline  not tested today 2nd to increase in pain    Time  4    Period  Weeks    Status  On-going    Target Date  10/10/18      PT SHORT TERM GOAL #3   Title  improve rt knee and ankle strength =/> 4/5 throughout ( 10/10/2018)    Baseline  continues to be limited 3/5 DF/ EV and IV today 2nd to significant increase in pain    Time  4    Period  Weeks    Status  On-going    Target Date  10/10/18      PT SHORT TERM GOAL #4   Title  I with initial HEP ( 10/10/2018)    Baseline  continues to do exervcises but advised to hold off for the next few days until the pain comes down    Time  4    Period  Weeks    Status  On-going    Target Date  10/10/18        PT Long Term Goals - 10/11/18 0850      PT LONG TERM GOAL #1   Title  Patient will go up and down 10 steps without pain in order to perfrom work tasks    Baseline  increased pain with steps    Time  6    Period  Weeks    Status  New    Target Date  11/22/18      PT LONG TERM GOAL #2   Title  Patient will stand for 1 hour without increased pain in orde to perfrom work tasks.    Baseline  Pain standing > 20 min    Time  4    Period  Weeks    Status  New    Target Date  11/22/18  Plan - 10/11/18 0917    Clinical Impression Statement  The patient was limited by pain today. She had a poor tolerance to light stretching. She felt the ultrasound had helped. Prior to this weekeend the patient had made progress. Her active DF ah increased to 10 degrees. Today it was at -5. Her strength was limited today to 3/5 2nd to pain. Prior to acute onset of pain the patient had made progress. She would benefit from skilled therapy 1W6 to contiue to work on strength, stability, and gait training.    Personal Factors and Comorbidities  Comorbidity 2    Examination-Activity Limitations  Stand    Examination-Participation Restrictions  Other    Stability/Clinical Decision Making   Stable/Uncomplicated    Clinical Decision Making  Low    Rehab Potential  Good    PT Frequency  1x / week    PT Duration  4 weeks    PT Treatment/Interventions  Functional mobility training;Patient/family education;Moist Heat;Ultrasound;Therapeutic exercise;Dry needling;Balance training;Cryotherapy;Electrical Stimulation;Neuromuscular re-education;Manual techniques    PT Next Visit Plan  reassess for more visits    Consulted and Agree with Plan of Care  Patient       Patient will benefit from skilled therapeutic intervention in order to improve the following deficits and impairments:  Difficulty walking, Decreased balance, Other (comment), Decreased strength  Visit Diagnosis: Pain in right foot  Muscle weakness (generalized)  Difficulty in walking, not elsewhere classified     Problem List Patient Active Problem List   Diagnosis Date Noted  . Edema of both feet 08/02/2018  . Snoring 08/02/2018  . Internal bleeding hemorrhoids 07/31/2018  . Right wrist pain 04/21/2018  . Edema of hands 03/21/2018  . Obesity (BMI 30-39.9) 10/12/2017  . Back pain, lumbosacral 07/06/2017  . Right hip pain 10/29/2015  . 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 DM neuropathy, type I diabetes mellitus ([place]) 03/13/2014  . Vaginal Pap smear 08/21/2010  . TOBACCO USER 10/27/2008  . Diabetes mellitus type I ([place]) 03/18/2006  . Major depressive disorder, recurrent episode ([place]) 03/18/2006  . Migraine headache 03/18/2006    Carney Living  PT DPT  10/11/2018, 11:16 AM  [facility] [address] [place], [place], [address] Phone: [phone]   Fax:  [phone]  Name: Barbara Clark MRN: [id number] Date of Birth: [birth year]

## 2018-10-19 ENCOUNTER — Ambulatory Visit (HOSPITAL_COMMUNITY)
Admission: RE | Admit: 2018-10-19 | Discharge: 2018-10-19 | Disposition: A | Discharge status: Home / Self Care | Payer: Medicaid Other | Source: Ambulatory Visit | Attending: Cardiovascular Disease | Admitting: Cardiovascular Disease

## 2018-10-19 ENCOUNTER — Other Ambulatory Visit: Payer: Self-pay

## 2018-10-19 DIAGNOSIS — R609 Edema, unspecified: Secondary | ICD-10-CM | POA: Diagnosis present

## 2018-10-19 DIAGNOSIS — I872 Venous insufficiency (chronic) (peripheral): Secondary | ICD-10-CM | POA: Diagnosis not present

## 2018-10-21 ENCOUNTER — Telehealth: Payer: Self-pay | Admitting: *Deleted

## 2018-10-21 NOTE — Telephone Encounter (Signed)
-----   Message from Trula Slade, DPM sent at 10/21/2018  7:31 AM EDT ----- Val- please let her know that the Venous duplex negative for reflux and DVT. Please let her know. Continue compression but she should also follow up with her PCP for the leg swelling.

## 2018-10-21 NOTE — Telephone Encounter (Signed)
I informed pt of Dr. Leigh Aurora review of results and recommendations.

## 2018-10-25 ENCOUNTER — Ambulatory Visit: Payer: Medicaid Other | Attending: Podiatry | Admitting: Physical Therapy

## 2018-10-25 ENCOUNTER — Encounter: Payer: Self-pay | Admitting: Physical Therapy

## 2018-10-25 ENCOUNTER — Other Ambulatory Visit: Payer: Self-pay

## 2018-10-25 DIAGNOSIS — R2689 Other abnormalities of gait and mobility: Secondary | ICD-10-CM | POA: Diagnosis present

## 2018-10-25 DIAGNOSIS — R262 Difficulty in walking, not elsewhere classified: Secondary | ICD-10-CM | POA: Diagnosis present

## 2018-10-25 DIAGNOSIS — M6281 Muscle weakness (generalized): Secondary | ICD-10-CM | POA: Insufficient documentation

## 2018-10-25 DIAGNOSIS — M79671 Pain in right foot: Secondary | ICD-10-CM | POA: Diagnosis not present

## 2018-10-25 DIAGNOSIS — R296 Repeated falls: Secondary | ICD-10-CM | POA: Insufficient documentation

## 2018-10-25 NOTE — Therapy (Signed)
[place], [place], [address] Phone: [phone]   Fax:  [phone]  Physical Therapy Treatment  Patient Details  Name: Barbara Clark MRN: [id number] Date of Birth: November 28, 1975 Referring Provider (PT): Dr Celesta Gentile   Encounter Date: 10/25/2018  PT End of Session - 10/25/18 0826    Visit Number  5    Number of Visits  9    Date for PT Re-Evaluation  12/06/18    Authorization Type  3 visits 9/3 through 9/23 will re-submit    PT Start Time  0800    PT Stop Time  0841    PT Time Calculation (min)  41 min    Activity Tolerance  Patient tolerated treatment well    Behavior During Therapy  [facility] for tasks assessed/performed       Past Medical History:  Diagnosis Date  . Abnormality of gait 12/26/2012  . Anxiety   . Chronic leg pain 04/27/2014  . Depression   . Diabetes mellitus    Type I  . DKA (diabetic ketoacidosis) ([place]) 09/03/2017  . HSV-2 (herpes simplex virus 2) infection 2012   serology  . Limb weakness 08/04/2013  . Menorrhagia 04/27/2014  . Right leg weakness 02/20/2014  . Stuttering 02/02/2013    Past Surgical History:  Procedure Laterality Date  . TUBAL LIGATION      There were no vitals filed for this visit.  Subjective Assessment - 10/25/18 [id number]    Subjective  Patient reports minor pain in her ankle. She is still owrking 2 jobs but may stop doing one. She is still having swelling in her feet. She has been to a vascular doctor who treports everything is going well/.    Pertinent History  DM, Rt carpal tunnel, Rt ankle fx as a teenager - 3 places    How long can you walk comfortably?  no limitations.    Diagnostic tests  x-rays (-) for fx    Patient Stated Goals  get swelling and pain out. Not have to use a cane again.    Currently in Pain?  Yes    Pain Score  3     Pain Location  Ankle    Pain Orientation  Right    Pain Descriptors / Indicators  Aching    Pain Type  Chronic pain    Pain Onset  More than a month ago    Pain Frequency  Intermittent    Aggravating Factors   standing, walking, stairs    Pain Relieving Factors  nothing when pain begins         [facility] PT Assessment - 10/25/18 0001      AROM   Right Ankle Dorsiflexion  0    Right Ankle Plantar Flexion  33    Right Ankle Inversion  15    Right Ankle Eversion  6      PROM   Right Ankle Dorsiflexion  12    Right Ankle Inversion  37    Right Ankle Eversion  20      Strength   Right Hip Flexion  3+/5    Right Hip ABduction  4/5    Right Hip ADduction  4+/5    Right Ankle Dorsiflexion  4/5    Right Ankle Inversion  4/5    Right Ankle Eversion  4/5                   [facility] Adult PT Treatment/Exercise - 10/25/18  0001      Modalities   Modalities  Ultrasound      Ultrasound   Ultrasound Goals  Pain;Edema      Manual Therapy   Manual Therapy  Soft tissue mobilization;Edema management;Passive ROM    Edema Management  gentle edema massage    Soft tissue mobilization  to posterior calf     Passive ROM  gentle into DF. Increased pain at first but improved with stretching       Ankle Exercises: Stretches   Gastroc Stretch  1 rep;30 seconds      Ankle Exercises: Supine   T-Band  x20 green 4 way     Other Supine Ankle Exercises  x15 4 way AROM limited by pain       Ankle Exercises: Standing   Heel Raises  Both;10 reps   each toes straight, out, in   Other Standing Ankle Exercises  hip abduction x10 bilteral; hip extension x10 bilateral;     Other Standing Ankle Exercises  standing march x10 bilateral; 4 inch step up with HHA; x10 fwd and to the right       Ankle Exercises: Machines for Strengthening   Cybex Leg Press  d      Ankle Exercises: Aerobic   Nustep  unable today 2nd to significant pain              PT Education - 10/25/18 0825    Education Details  reviewed syptom management and exercises prescription. Patient tends to over-do it    Person(s) Educated   Patient    Methods  Explanation;Demonstration;Tactile cues;Verbal cues    Comprehension  Verbalized understanding;Returned demonstration;Verbal cues required;Tactile cues required       PT Short Term Goals - 10/25/18 0834      PT SHORT TERM GOAL #1   Title  Patient will increase right hip flexion strength to 4+/5 ( 10/10/2018)    Baseline  stringth had improved but strength decreased today 2nd to severe pain    Time  4    Period  Weeks    Status  On-going      PT SHORT TERM GOAL #2   Title  improve Rt SLS =/> 10 sec ( 10/10/2018)    Baseline  not tested today 2nd to increase in pain    Time  4    Period  Weeks    Status  On-going    Target Date  11/29/18      PT SHORT TERM GOAL #3   Title  improve rt knee and ankle strength =/> 4/5 throughout ( 10/10/2018)    Baseline  continues to be limited 3/5 DF/ EV and IV today 2nd to significant increase in pain    Time  4    Period  Weeks    Status  On-going    Target Date  11/29/18      PT SHORT TERM GOAL #4   Title  I with initial HEP ( 10/10/2018)    Baseline  continues to do exervcises but advised to hold off for the next few days until the pain comes down    Time  4    Period  Weeks    Status  On-going    Target Date  11/29/18        PT Long Term Goals - 10/25/18 0857      PT LONG TERM GOAL #1   Title  Patient will go up and down 10 steps without pain in  order to perfrom work tasks    Baseline  began stair training today    Time  6    Period  Weeks    Status  On-going    Target Date  11/29/18      PT LONG TERM GOAL #2   Title  Patient will stand for 1 hour without increased pain in orde to perfrom work tasks.    Baseline  Pain standing > 20 min    Time  4    Period  Weeks    Status  On-going    Target Date  11/29/18            Plan - 10/25/18 0827    Clinical Impression Statement  Paitent did better today. She tolerated exercises well. She still has limited DF. She was encouraged toi continue stretching at  home. Therapy added stair training today> She tolerated well. Patient ould benefit from further therapy 2W5.    Personal Factors and Comorbidities  Comorbidity 2    Examination-Activity Limitations  Stand    Examination-Participation Restrictions  Other    Stability/Clinical Decision Making  Stable/Uncomplicated    Clinical Decision Making  Low    Rehab Potential  Good    PT Frequency  1x / week    PT Duration  3 weeks    PT Treatment/Interventions  Functional mobility training;Patient/family education;Moist Heat;Ultrasound;Therapeutic exercise;Dry needling;Balance training;Cryotherapy;Electrical Stimulation;Neuromuscular re-education;Manual techniques    PT Next Visit Plan  reassess for more visits    Consulted and Agree with Plan of Care  Patient       Patient will benefit from skilled therapeutic intervention in order to improve the following deficits and impairments:  Difficulty walking, Decreased balance, Other (comment), Decreased strength  Visit Diagnosis: Pain in right foot - Plan: PT plan of care cert/re-cert  Muscle weakness (generalized) - Plan: PT plan of care cert/re-cert  Difficulty in walking, not elsewhere classified - Plan: PT plan of care cert/re-cert     Problem List Patient Active Problem List   Diagnosis Date Noted  . Edema of both feet 08/02/2018  . Snoring 08/02/2018  . Internal bleeding hemorrhoids 07/31/2018  . Right wrist pain 04/21/2018  . Edema of hands 03/21/2018  . Obesity (BMI 30-39.9) 10/12/2017  . Back pain, lumbosacral 07/06/2017  . Right hip pain 10/29/2015  . 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 DM neuropathy, type I diabetes mellitus ([place]) 03/13/2014  . Vaginal Pap smear 08/21/2010  . TOBACCO USER 10/27/2008  . Diabetes mellitus type I ([place]) 03/18/2006  . Major depressive disorder, recurrent episode ([place]) 03/18/2006  . Migraine headache 03/18/2006    Carney Living 10/25/2018, 9:04 AM  [facility] [address] [place], [place], [address] Phone: [phone]   Fax:  [phone]  Name: Elowyn Clementi MRN: [id number] Date of Birth: Apr 29, 1975

## 2018-11-01 ENCOUNTER — Ambulatory Visit: Payer: Medicaid Other | Admitting: Physical Therapy

## 2018-11-01 DIAGNOSIS — G5601 Carpal tunnel syndrome, right upper limb: Secondary | ICD-10-CM | POA: Insufficient documentation

## 2018-11-08 ENCOUNTER — Ambulatory Visit: Payer: Medicaid Other | Admitting: Physical Therapy

## 2018-11-08 ENCOUNTER — Encounter: Payer: Self-pay | Admitting: Physical Therapy

## 2018-11-08 ENCOUNTER — Other Ambulatory Visit: Payer: Self-pay

## 2018-11-08 DIAGNOSIS — R262 Difficulty in walking, not elsewhere classified: Secondary | ICD-10-CM

## 2018-11-08 DIAGNOSIS — M6281 Muscle weakness (generalized): Secondary | ICD-10-CM

## 2018-11-08 DIAGNOSIS — M79671 Pain in right foot: Secondary | ICD-10-CM

## 2018-11-08 NOTE — Therapy (Signed)
[place], [place], [address] Phone: [phone]   Fax:  [phone]  Physical Therapy Treatment  Patient Details  Name: Barbara Clark MRN: [id number] Date of Birth: 05/14/1975 Referring Provider (PT): Dr Celesta Gentile   Encounter Date: 11/08/2018  PT End of Session - 11/08/18 0811    Visit Number  6    Number of Visits  9    Date for PT Re-Evaluation  12/06/18    Authorization - Visit Number  3    Authorization - Number of Visits  5    PT Start Time  0803    PT Stop Time  0843    PT Time Calculation (min)  40 min    Activity Tolerance  Patient tolerated treatment well    Behavior During Therapy  [facility] for tasks assessed/performed       Past Medical History:  Diagnosis Date  . Abnormality of gait 12/26/2012  . Anxiety   . Chronic leg pain 04/27/2014  . Depression   . Diabetes mellitus    Type I  . DKA (diabetic ketoacidosis) ([place]) 09/03/2017  . HSV-2 (herpes simplex virus 2) infection 2012   serology  . Limb weakness 08/04/2013  . Menorrhagia 04/27/2014  . Right leg weakness 02/20/2014  . Stuttering 02/02/2013    Past Surgical History:  Procedure Laterality Date  . TUBAL LIGATION      There were no vitals filed for this visit.  Subjective Assessment - 11/08/18 0810    Subjective  Patient reports she has been having some knee pain. Her foot is feeling better.  Her knee hurt after she did squats at the gym. She is only doing 30 minutes of tredmill work at [employer]. She is having no pain this morning.    Pertinent History  DM, Rt carpal tunnel, Rt ankle fx as a teenager - 3 places    How long can you walk comfortably?  no limitations.    Diagnostic tests  x-rays (-) for fx    Patient Stated Goals  get swelling and pain out. Not have to use a cane again.    Currently in Pain?  No/denies                       [facility] Adult PT Treatment/Exercise - 11/08/18 0001      Self-Care   Self-Care  Other Self-Care Comments    Other Self-Care Comments   reviewed RICE for inflamation. Aslo reviewed symptom mangament. Patient has a tednency to over-do her exercises.       Ankle Exercises: Stretches   Gastroc Stretch  1 rep;30 seconds    Other Stretch  hamstring stretch 2x30 sec hold bilateral       Ankle Exercises: Standing   Heel Raises  20 reps    Other Standing Ankle Exercises  reviewed proper suqat. Patiient only able to go a limited amount down before she bringsher kenes. Over her toes. With cuing she was able to correct. When she does a proper squat she feels like her balance is off. She was advised not to perfrom a suqat with weight until she can do a proper one without weight.     Other Standing Ankle Exercises  standing march x10 bilateral; 6 inch step up with HHA; x10 fwd and side to side with each leg; step onto air-ex x20 fewd and back.       Ankle Exercises: Aerobic   Nustep  2min L4       Ankle Exercises: Machines for Strengthening   Cybex Leg Press  2x10 40 lbs              PT Education - 11/08/18 0811    Education Details  reviewwed proper squat technique    Person(s) Educated  Patient    Methods  Explanation;Tactile cues;Demonstration;Verbal cues    Comprehension  Verbalized understanding;Returned demonstration;Verbal cues required;Tactile cues required       PT Short Term Goals - 11/08/18 0851      PT SHORT TERM GOAL #1   Title  Patient will increase right hip flexion strength to 4+/5 ( 10/10/2018)    Baseline  stringth had improved but strength decreased today 2nd to severe pain    Time  4    Period  Weeks    Status  Achieved      PT SHORT TERM GOAL #2   Title  improve Rt SLS =/> 10 sec ( 10/10/2018)    Baseline  not tested today 2nd to increase in pain    Time  4    Period  Weeks    Status  On-going      PT SHORT TERM GOAL #3   Title  improve rt knee and ankle strength =/> 4/5 throughout ( 10/10/2018)    Baseline  continues to be  limited 3/5 DF/ EV and IV today 2nd to significant increase in pain    Time  4    Period  Weeks    Status  On-going      PT SHORT TERM GOAL #4   Title  I with initial HEP ( 10/10/2018)    Baseline  continues to do exervcises but advised to hold off for the next few days until the pain comes down    Time  4    Period  Weeks    Status  On-going        PT Long Term Goals - 10/25/18 0857      PT LONG TERM GOAL #1   Title  Patient will go up and down 10 steps without pain in order to perfrom work tasks    Baseline  began stair training today    Time  6    Period  Weeks    Status  On-going    Target Date  11/29/18      PT LONG TERM GOAL #2   Title  Patient will stand for 1 hour without increased pain in orde to perfrom work tasks.    Baseline  Pain standing > 20 min    Time  4    Period  Weeks    Status  On-going    Target Date  11/29/18            Plan - 11/08/18 [id number]    Clinical Impression Statement  Patient is getting back to her baseline. She has some knee pain but she is also doing harder exercises at te gym. Therapy reviewed the importance of symptom mmgemen and icing if she over-does it.Therapy also reviewed proper squat technqiue. She tolerated all exercises well. Therapy will review her execises next visit with expected D/C to HEP at that time.    Personal Factors and Comorbidities  Comorbidity 2    Examination-Activity Limitations  Stand    Stability/Clinical Decision Making  Stable/Uncomplicated    Clinical Decision Making  Low    Rehab Potential  Good    PT Frequency  1x /  week    PT Duration  3 weeks    PT Treatment/Interventions  Functional mobility training;Patient/family education;Moist Heat;Ultrasound;Therapeutic exercise;Dry needling;Balance training;Cryotherapy;Electrical Stimulation;Neuromuscular re-education;Manual techniques    Consulted and Agree with Plan of Care  Patient       Patient will benefit from skilled therapeutic intervention in order  to improve the following deficits and impairments:  Difficulty walking, Decreased balance, Other (comment), Decreased strength  Visit Diagnosis: Pain in right foot  Muscle weakness (generalized)  Difficulty in walking, not elsewhere classified     Problem List Patient Active Problem List   Diagnosis Date Noted  . Edema of both feet 08/02/2018  . Snoring 08/02/2018  . Internal bleeding hemorrhoids 07/31/2018  . Right wrist pain 04/21/2018  . Edema of hands 03/21/2018  . Obesity (BMI 30-39.9) 10/12/2017  . Back pain, lumbosacral 07/06/2017  . Right hip pain 10/29/2015  . 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 DM neuropathy, type I diabetes mellitus ([place]) 03/13/2014  . Vaginal Pap smear 08/21/2010  . TOBACCO USER 10/27/2008  . Diabetes mellitus type I ([place]) 03/18/2006  . Major depressive disorder, recurrent episode ([place]) 03/18/2006  . Migraine headache 03/18/2006    Carney Living  PT DPT  11/08/2018, 9:12 AM  [facility] [address] [place], [place], [address] Phone: [phone]   Fax:  [phone]  Name: Barbara Clark MRN: [id number] Date of Birth: [birth year]

## 2018-11-15 ENCOUNTER — Encounter: Payer: Self-pay | Admitting: Physical Therapy

## 2018-11-15 ENCOUNTER — Ambulatory Visit: Payer: Medicaid Other | Admitting: Physical Therapy

## 2018-11-15 ENCOUNTER — Other Ambulatory Visit: Payer: Self-pay

## 2018-11-15 DIAGNOSIS — R296 Repeated falls: Secondary | ICD-10-CM

## 2018-11-15 DIAGNOSIS — R2689 Other abnormalities of gait and mobility: Secondary | ICD-10-CM

## 2018-11-15 DIAGNOSIS — M6281 Muscle weakness (generalized): Secondary | ICD-10-CM

## 2018-11-15 DIAGNOSIS — M79671 Pain in right foot: Secondary | ICD-10-CM

## 2018-11-15 DIAGNOSIS — R262 Difficulty in walking, not elsewhere classified: Secondary | ICD-10-CM

## 2018-11-15 NOTE — Therapy (Addendum)
[place], [place], [address] Phone: [phone]   Fax:  [phone]  Physical Therapy Treatment/Discharge   Patient Details  Name: Barbara Clark MRN: [id number] Date of Birth: September 24, 1975 Referring Provider (PT): Dr Celesta Gentile   Encounter Date: 11/15/2018  PT End of Session - 11/15/18 0840    Visit Number  7    Number of Visits  9    Date for PT Re-Evaluation  12/06/18    Authorization Type  3 visits 9/3 through 9/23 will re-submit    PT Start Time  0800    PT Stop Time  0841    PT Time Calculation (min)  41 min    Activity Tolerance  Patient tolerated treatment well    Behavior During Therapy  [facility] for tasks assessed/performed       Past Medical History:  Diagnosis Date  . Abnormality of gait 12/26/2012  . Anxiety   . Chronic leg pain 04/27/2014  . Depression   . Diabetes mellitus    Type I  . DKA (diabetic ketoacidosis) ([place]) 09/03/2017  . HSV-2 (herpes simplex virus 2) infection 2012   serology  . Limb weakness 08/04/2013  . Menorrhagia 04/27/2014  . Right leg weakness 02/20/2014  . Stuttering 02/02/2013    Past Surgical History:  Procedure Laterality Date  . TUBAL LIGATION      There were no vitals filed for this visit.  Subjective Assessment - 11/15/18 0804    Subjective  Patient reports her whole body is sore today. She has been going to the gym. She is no longer having much pain in her foot. She is down to doing 1 job which has helped.    Pertinent History  DM, Rt carpal tunnel, Rt ankle fx as a teenager - 3 places    How long can you walk comfortably?  no limitations.    Diagnostic tests  x-rays (-) for fx    Patient Stated Goals  get swelling and pain out. Not have to use a cane again.    Currently in Pain?  No/denies   general soreness but cant specify        [facility] PT Assessment - 11/15/18 0001      AROM   Right Ankle Dorsiflexion  10    Right Ankle Plantar Flexion  33    Right Ankle Inversion  15    Right Ankle Eversion  6      PROM   Right Ankle Dorsiflexion  12    Right Ankle Inversion  37    Right Ankle Eversion  20      Strength   Right Hip Flexion  4/5    Right Hip ABduction  4+/5    Right Hip ADduction  4+/5    Right Ankle Dorsiflexion  4+/5    Right Ankle Inversion  4+/5    Right Ankle Eversion  4+/5                   [facility] Adult PT Treatment/Exercise - 11/15/18 0001      Self-Care   Other Self-Care Comments   reviewed use of stretching  vs strengthening; reviewed symptom mangement; reviewed activity progression       Lumbar Exercises: Machines for Strengthening   Other Lumbar Machine Exercise  reviewed row machine with core stability for HEP 2x1025lbs both handles.    Other Lumbar Machine Exercise  lat pull down with 2x10  Lumbar Exercises: Standing   Other Standing Lumbar Exercises  reviewed proper squat technique     Other Standing Lumbar Exercises  reviewed how to brace core with all basic exercises.      Lumbar Exercises: Supine   Clam  20 reps    Bent Knee Raise Limitations  2x10; reviewedhow to make them harder     Bridge  20 reps      Ankle Exercises: Stretches   Gastroc Stretch  30 seconds;2 reps    Other Stretch  hamstring stretch 2x30 sec hold bilateral; piriformis stretch 2x20 sec hold       Ankle Exercises: Aerobic   Nustep  78mn L4       Ankle Exercises: Standing   Heel Raises  20 reps    Other Standing Ankle Exercises  standing march x10 bilateral; 6 inch step up with HHA; x10 fwd and side to side with each leg; step onto air-ex x20 fewd and back.              PT Education - 11/15/18 0807    Education Details  reviewed final HEP    Person(s) Educated  Patient    Methods  Explanation;Tactile cues;Verbal cues;Demonstration    Comprehension  Verbalized understanding;Returned demonstration;Verbal cues required       PT Short Term Goals - 11/08/18 0851      PT SHORT TERM GOAL #1    Title  Patient will increase right hip flexion strength to 4+/5 ( 10/10/2018)    Baseline  stringth had improved but strength decreased today 2nd to severe pain    Time  4    Period  Weeks    Status  Achieved      PT SHORT TERM GOAL #2   Title  improve Rt SLS =/> 10 sec ( 10/10/2018)    Baseline  not tested today 2nd to increase in pain    Time  4    Period  Weeks    Status  On-going      PT SHORT TERM GOAL #3   Title  improve rt knee and ankle strength =/> 4/5 throughout ( 10/10/2018)    Baseline  continues to be limited 3/5 DF/ EV and IV today 2nd to significant increase in pain    Time  4    Period  Weeks    Status  On-going      PT SHORT TERM GOAL #4   Title  I with initial HEP ( 10/10/2018)    Baseline  continues to do exervcises but advised to hold off for the next few days until the pain comes down    Time  4    Period  Weeks    Status  On-going        PT Long Term Goals - 10/25/18 0857      PT LONG TERM GOAL #1   Title  Patient will go up and down 10 steps without pain in order to perfrom work tasks    Baseline  began stair training today    Time  6    Period  Weeks    Status  On-going    Target Date  11/29/18      PT LONG TERM GOAL #2   Title  Patient will stand for 1 hour without increased pain in orde to perfrom work tasks.    Baseline  Pain standing > 20 min    Time  4    Period  Weeks  Status  On-going    Target Date  11/29/18            Plan - 11/15/18 9030    Clinical Impression Statement  Patient has reached goals for therapy. She continues to have wekanes sin her right leg but she is working on that at [employer]. Therapy reviewed some exercises for her to focus on such as supine marching for hip flexor strength. She was also advised to continue with her DF stretching. Her DF has improved significantly. She feels comfortable with her HEP. She will be d/C'd to HEP at this time.    Personal Factors and Comorbidities  Comorbidity 2     Examination-Activity Limitations  Stand    Examination-Participation Restrictions  Other    Stability/Clinical Decision Making  Stable/Uncomplicated    Clinical Decision Making  Low    Rehab Potential  Good    PT Frequency  1x / week    PT Duration  3 weeks    PT Treatment/Interventions  Functional mobility training;Patient/family education;Moist Heat;Ultrasound;Therapeutic exercise;Dry needling;Balance training;Cryotherapy;Electrical Stimulation;Neuromuscular re-education;Manual techniques    PT Next Visit Plan  reassess for more visits    Consulted and Agree with Plan of Care  Patient       Patient will benefit from skilled therapeutic intervention in order to improve the following deficits and impairments:  Difficulty walking, Decreased balance, Other (comment), Decreased strength  Visit Diagnosis: Pain in right foot  Muscle weakness (generalized)  Difficulty in walking, not elsewhere classified  Other abnormalities of gait and mobility  Repeated falls     Problem List Patient Active Problem List   Diagnosis Date Noted  . Edema of both feet 08/02/2018  . Snoring 08/02/2018  . Internal bleeding hemorrhoids 07/31/2018  . Right wrist pain 04/21/2018  . Edema of hands 03/21/2018  . Obesity (BMI 30-39.9) 10/12/2017  . Back pain, lumbosacral 07/06/2017  . Right hip pain 10/29/2015  . 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 DM neuropathy, type I diabetes mellitus ([place]) 03/13/2014  . Vaginal Pap smear 08/21/2010  . TOBACCO USER 10/27/2008  . Diabetes mellitus type I ([place]) 03/18/2006  . Major depressive disorder, recurrent episode ([place]) 03/18/2006  . Migraine headache 03/18/2006    PHYSICAL THERAPY DISCHARGE SUMMARY  Visits from Start of Care: 7  Current functional level related to goals / functional outcomes: Improved pain in her foot, back on HEP   Remaining deficits: Pain in her foot at times    Education / Equipment: HEP   Plan: Patient  agrees to discharge.  Patient goals were met. Patient is being discharged due to being pleased with the current functional level.  ?????      Carney Living  PT DPT  11/15/2018, 1:42 PM  [facility] [address] [place], [place], [address] Phone: [phone]   Fax:  [phone]  Name: Laraina Sulton MRN: [id number] Date of Birth: 31-May-1975

## 2018-12-05 ENCOUNTER — Ambulatory Visit: Payer: Medicaid Other | Admitting: Podiatry

## 2018-12-05 ENCOUNTER — Other Ambulatory Visit: Payer: Self-pay

## 2018-12-05 ENCOUNTER — Encounter: Payer: Self-pay | Admitting: Podiatry

## 2018-12-05 DIAGNOSIS — E1142 Type 2 diabetes mellitus with diabetic polyneuropathy: Secondary | ICD-10-CM

## 2018-12-05 DIAGNOSIS — I872 Venous insufficiency (chronic) (peripheral): Secondary | ICD-10-CM

## 2018-12-05 DIAGNOSIS — R609 Edema, unspecified: Secondary | ICD-10-CM

## 2018-12-07 NOTE — Progress Notes (Signed)
Subjective: 43 year old female presents the office today for follow-up evaluation of swelling, neuropathy as well as plantar fasciitis.  She said that overall she is doing better but she still gets swelling to her ankles at times.  She denies any recent injury she has no pain associated with swelling.  Her plantar fasciitis is doing much better.  She is on gabapentin as well. Denies any systemic complaints such as fevers, chills, nausea, vomiting. No acute changes since last appointment, and no other complaints at this time.   Objective: AAO x3, NAD DP/PT pulses palpable bilaterally, CRT less than 3 seconds Mild swelling to the ankles today but she showed me pictures of her ankles become swollen.  This is most after being on her feet during the day.  She has no pain associated with swelling.  Ankle, subtalar range of motion intact.  Flexor, extensor tendons appear to be intact.  No pain in the plantar fascial, Achilles tendon.  No open lesions or pre-ulcerative lesions.  No pain with calf compression, swelling, warmth, erythema  Assessment: Bilateral ankle swelling, plantar fasciitis and neuropathy  Plan: -All treatment options discussed with the patient including all alternatives, risks, complications.  -Recommended compression socks.  Prescription was given.  Encouraged elevation.  Continue stretching, rehab exercise as well as encouraged walking.  Follow-up with your primary care physician as well in regards to the swelling throughout any other underlying issues. -Patient encouraged to call the office with any questions, concerns, change in symptoms.   Trula Slade DPM

## 2018-12-20 DIAGNOSIS — M654 Radial styloid tenosynovitis [de Quervain]: Secondary | ICD-10-CM | POA: Insufficient documentation

## 2019-01-03 LAB — BASIC METABOLIC PANEL
BUN: 9 (ref 4–21)
Creatinine: 0.7 (ref 0.5–1.1)
Glucose: 260

## 2019-01-03 LAB — LIPID PANEL
Cholesterol: 100 (ref 0–200)
HDL: 39 (ref 35–70)
LDL Cholesterol: 49
Triglycerides: 50 (ref 40–160)

## 2019-01-03 LAB — HEMOGLOBIN A1C: Hemoglobin A1C: 8.8

## 2019-01-03 LAB — MICROALBUMIN, URINE: Microalb, Ur: 1.27

## 2019-01-20 HISTORY — PX: EYE SURGERY: SHX253

## 2019-02-02 ENCOUNTER — Other Ambulatory Visit: Payer: Self-pay

## 2019-02-02 ENCOUNTER — Encounter: Payer: Self-pay | Admitting: Family Medicine

## 2019-02-02 ENCOUNTER — Ambulatory Visit (INDEPENDENT_AMBULATORY_CARE_PROVIDER_SITE_OTHER): Payer: Medicaid Other | Admitting: Family Medicine

## 2019-02-02 VITALS — BP 122/70 | HR 87 | Wt 216.0 lb

## 2019-02-02 DIAGNOSIS — E1040 Type 1 diabetes mellitus with diabetic neuropathy, unspecified: Secondary | ICD-10-CM

## 2019-02-02 DIAGNOSIS — R5383 Other fatigue: Secondary | ICD-10-CM | POA: Diagnosis not present

## 2019-02-02 DIAGNOSIS — R8290 Unspecified abnormal findings in urine: Secondary | ICD-10-CM

## 2019-02-02 LAB — POCT URINALYSIS DIP (MANUAL ENTRY)
Bilirubin, UA: NEGATIVE
Blood, UA: NEGATIVE
Glucose, UA: 500 mg/dL — AB
Ketones, POC UA: NEGATIVE mg/dL
Leukocytes, UA: NEGATIVE
Nitrite, UA: NEGATIVE
Protein Ur, POC: NEGATIVE mg/dL
Spec Grav, UA: 1.025 (ref 1.010–1.025)
Urobilinogen, UA: 0.2 E.U./dL
pH, UA: 6 (ref 5.0–8.0)

## 2019-02-02 NOTE — Assessment & Plan Note (Signed)
Patient reports smell of "ammonia" from her urine.  Denies urinary frequency, urinary urgency, or suprapubic pain.  Also denies fevers, no back pains. -Assess urinalysis: Showing elevated glucose in the urine

## 2019-02-02 NOTE — Progress Notes (Signed)
   Subjective:    Patient ID: Leeanne Rio, female    DOB: May 22, 1975, 44 y.o.   MRN: [id number]   CC: feels dehydrated  HPI: Nataliah Feria is a pleasant 44 year old female with T1DM, presenting today for concerns of feeling dehydrated .  Feels dehydrated/fatigued: since December 2020 she has felt dehydrated and has been feeling drained.  She has been trying to hydrate with oral fluids, but states that even though she drinks fluids during the day, her urine in the morning is dark and has a concerning, foul odor.  When she drinks water her urine appropriately lightens up in color, but it is difficult to maintain hydration.  She works a Psychologist, clinical job with long hours, usually gets up around 2:45 am and sometimes is unable to settle down and fall asleep before 10 or 11:00 at night.  Additionally, her blood sugars have been in the 200s.  She denies any headaches, vision changes, chest pain, shortness of breath, nausea, vomiting, or rashes.  Also denies dysuria, urinary frequency, and suprapubic tenderness.  She is followed by Dr. Buddy Duty (endocrinologist) for her type 1 diabetes.  She last saw him January 03, 2019.  Smoking status reviewed - quit 2 years ago  Review of Systems - see HPI   Objective:  BP 122/70   Pulse 87   Wt 216 lb (98 kg)   SpO2 98%   BMI 33.83 kg/m  Vitals and nursing note reviewed  General: Appears sleepy HEENT: Moist mucous membranes appreciated Cardiac: RRR, clear S1 and S2, no murmurs appreciated Respiratory: CTA bilaterally, normal work of breathing Abdomen: Normal bowel sounds appreciated  Assessment & Plan:   Fatigue I strongly believe that patient's fatigue is most likely due to uncontrolled diabetes.  Patient's decreased sleep (averaging about 5 to 6 hours nightly) could also be contributing.  Most recent thyroid panel was within normal limits. -Patient would benefit from seeing her endocrinologist for tighter diabetes control -Will assess urinalysis for  sugar content and for any sign of infection  Foul smelling urine Patient reports smell of "ammonia" from her urine.  Denies urinary frequency, urinary urgency, or suprapubic pain.  Also denies fevers, no back pains. -Assess urinalysis: Showing elevated glucose in the urine  Diabetes mellitus type I ([place]) Patient has type 1 diabetes, most recent HbA1c 8.8% in December 2020.  She is followed by Dr. Buddy Duty (endocrinologist) for control of her diabetes.  She has an insulin pump and is therefore not particularly involved in managing her diabetes manually. -Patient would benefit from tighter control of her diabetes at such a young age and recommended she go back to see Dr. Buddy Duty    Return if symptoms worsen or fail to improve.   Dr. Milus Banister [facility], PGY-2

## 2019-02-02 NOTE — Progress Notes (Signed)
Patient had a telemedicine visit with Dr. Buddy Duty in December 2020.  Labs were performed on 01-03-2019.  Results placed in chart and scanned.  Melissaann Dizdarevic,CMA

## 2019-02-02 NOTE — Patient Instructions (Addendum)
Thank you for coming in to see [place] today! Please see below to review our plan for today's visit:  1. Call Dr. Cindra Eves office and get an appt with him as soon as you can. My concern is that you need tighter control of your diabetes (goal A1c about 7%).  2. Get to bed at a better time - aim for 9pm. You can take Melatonin 5mg  one hour before bed time.  3. We are checking your urine - I will call you if there is anything that is concerning or needs to be treated.   Please call the clinic at [phone] if your symptoms worsen or you have any concerns. It was our pleasure to serve you!    Dr. Milus Banister [facility]

## 2019-02-02 NOTE — Assessment & Plan Note (Signed)
Patient has type 1 diabetes, most recent HbA1c 8.8% in December 2020.  She is followed by Dr. Buddy Duty (endocrinologist) for control of her diabetes.  She has an insulin pump and is therefore not particularly involved in managing her diabetes manually. -Patient would benefit from tighter control of her diabetes at such a young age and recommended she go back to see Dr. Buddy Duty

## 2019-02-02 NOTE — Assessment & Plan Note (Signed)
I strongly believe that patient's fatigue is most likely due to uncontrolled diabetes.  Patient's decreased sleep (averaging about 5 to 6 hours nightly) could also be contributing.  Most recent thyroid panel was within normal limits. -Patient would benefit from seeing her endocrinologist for tighter diabetes control -Will assess urinalysis for sugar content and for any sign of infection

## 2019-03-07 ENCOUNTER — Ambulatory Visit: Payer: Medicaid Other | Admitting: Podiatry

## 2019-03-07 ENCOUNTER — Other Ambulatory Visit: Payer: Self-pay

## 2019-03-07 DIAGNOSIS — M722 Plantar fascial fibromatosis: Secondary | ICD-10-CM | POA: Diagnosis not present

## 2019-03-07 DIAGNOSIS — R609 Edema, unspecified: Secondary | ICD-10-CM | POA: Diagnosis not present

## 2019-03-07 DIAGNOSIS — E1142 Type 2 diabetes mellitus with diabetic polyneuropathy: Secondary | ICD-10-CM

## 2019-03-13 NOTE — Progress Notes (Signed)
Subjective: 44 year old female presents the office today follow-up evaluation of bilateral ankle swelling, plantar fasciitis and neuropathy.  She says overall she is doing a lot better she is having no pain to her feet currently and she has no new concerns.  She states that she was on gabapentin 100 mg 3 times a day but she did stop this medication and since stopping this the swelling is discontinued.  No redness or open sores. Denies any systemic complaints such as fevers, chills, nausea, vomiting. No acute changes since last appointment, and no other complaints at this time.   Objective: AAO x3, NAD DP/PT pulses palpable bilaterally, CRT less than 3 seconds No significant lower extremity edema is present.  No pain on the course or insertion of plantar fascial, Achilles tendon there is no area of pinpoint tenderness no pain with lateral compression of the calcaneus. No open lesions or pre-ulcerative lesions.  No pain with calf compression, swelling, warmth, erythema  Assessment: Resolving bilateral swelling, plantar fasciitis  Plan: -All treatment options discussed with the patient including all alternatives, risks, complications.  -Continue stretching exercises as well as wearing supportive shoes.  She is doing well off of gabapentin so we will hold off on continuing this. -Patient encouraged to call the office with any questions, concerns, change in symptoms.   Return in about 6 months (around 09/04/2019) for diabetic foot exam.  Trula Slade DPM

## 2019-04-25 ENCOUNTER — Encounter: Payer: Self-pay | Admitting: Family Medicine

## 2019-04-25 ENCOUNTER — Telehealth: Payer: Self-pay

## 2019-04-25 ENCOUNTER — Other Ambulatory Visit: Payer: Self-pay

## 2019-04-25 ENCOUNTER — Telehealth (INDEPENDENT_AMBULATORY_CARE_PROVIDER_SITE_OTHER): Payer: Medicaid Other | Admitting: Family Medicine

## 2019-04-25 VITALS — BP 122/78 | Ht 67.0 in | Wt 215.0 lb

## 2019-04-25 DIAGNOSIS — Z20822 Contact with and (suspected) exposure to covid-19: Secondary | ICD-10-CM | POA: Diagnosis not present

## 2019-04-25 NOTE — Telephone Encounter (Signed)
Patient calls nurse line reporting cough, diarrhea, and fatigue. Symptom onset Friday evening. Patient denies fever, SHOB, muscle aches and changes in taste and smell. Patient declines any recent known sick exposures, however, patient states that coworker tested positive for COVID two weeks ago. Patient reports having testing that came back negative for COVID after the exposure. Patient has not yet received the COVID vaccine.   Patient request virtual visit with provider.   Scheduled virtual visit with PCP this AM.   To PCP  Talbot Grumbling, RN

## 2019-04-25 NOTE — Progress Notes (Signed)
[facility] Telemedicine Visit  Patient consented to have virtual visit. Method of visit: Video  Encounter participants: Patient: Barbara Clark - located at Home Provider: Andrena Mews - located at Office Others (if applicable): N/A  Chief Complaint: Cough and Diarrhea    Cough This is a new problem. The current episode started in the past 7 days (Started 3 days ago). The problem has been gradually worsening. The cough is productive of sputum (Greenish mucus production). Associated symptoms include headaches and nasal congestion. Pertinent negatives include no chest pain, fever, myalgias, shortness of breath or wheezing. Weight loss: diarrhe. Associated symptoms comments: Voice change change. Nothing (She got exposed to someone with COVID-19 at her job. She got tested last wednesday the 30th and she was negative. Her last exposure was 3 weeks ago. This contact had two test positive and the last one was negative.) aggravates the symptoms. Treatments tried: Tylenol, OTC cough medicine. The treatment provided moderate relief.  Diarrhea  This is a new problem. The current episode started in the past 7 days (Started 4 days ago). The problem occurs 2 to 4 times per day. Progression since onset: Diarrhea is lightening up. Associated symptoms include coughing and headaches. Pertinent negatives include no abdominal pain, fever or myalgias. Weight loss: diarrhe. Associated symptoms comments: She felt nauseous when she had the diarrhea. None today. Last episode was yesterday. Nothing aggravates the symptoms. There are no known risk factors. Treatments tried: Keeping well hydrated. The treatment provided moderate relief.    ROS: per HPI  Pertinent PMHx: Reviewed  Exam:  Physical Exam Vitals and nursing note reviewed.  Constitutional:      General: She is not in acute distress.    Appearance: She is not ill-appearing.  Pulmonary:     Effort: Pulmonary effort is normal. No  respiratory distress.  Neurological:     Mental Status: She is alert and oriented to person, place, and time.      Assessment/Plan:  Close exposure to COVID-19 virus Presenting with Cough and Diarrhea. May be related to COVID-19. She had a negative text after her exposure. However, now that she is symptomatic, I recommended repeat testing. I counseled her regarding self-isolation, face-masking as well as good hand hygiene. She will be out of work pending COVID-19 result. ED precaution discussed. Continue oral hydration and Tylenol as needed for headache and pain. She agreed with the plan.    Time spent during visit with patient: 15 minutes  I gave her instruction on how to set up her MyChart account. She will print out AVS and work letter from her account.

## 2019-04-25 NOTE — Patient Instructions (Signed)
    Person Under Monitoring Name: Barbara Clark  Location: [place] [address]   CORONAVIRUS DISEASE 2019 (COVID-19) Guidance for Persons Under Investigation You are being tested for the virus that causes coronavirus disease 2019 (COVID-19). Public health actions are necessary to ensure protection of your health and the health of others, and to prevent further spread of infection. COVID-19 is caused by a virus that can cause symptoms, such as fever, cough, and shortness of breath. The primary transmission from person to person is by coughing or sneezing. On February 17, 2018, the [place] announced a [employer] Emergency of International Concern and on February 18, 2018 the U.S. Department of Health and Human Services declared a public health emergency. If the virus that causesCOVID-19 spreads in the community, it could have severe public health consequences.  As a person under investigation for COVID-19, the [place] advises you to adhere to the following guidance until your test results are reported to you. If your test result is positive, you will receive additional information from your provider and your local health department at that time.   Remain at home until you are cleared by your health provider or public health authorities.   Keep a log of visitors to your home using the form provided. Any visitors to your home must be aware of your isolation status.  If you plan to move to a new address or leave the county, notify the local health department in your county.  Call a doctor or seek care if you have an urgent medical need. Before seeking medical care, call ahead and get instructions from the provider before arriving at the medical office, clinic or hospital. Notify them that you are being tested for the virus that causes COVID-19 so arrangements can be made, as necessary,  to prevent transmission to others in the healthcare setting. Next, notify the local health department in your county.  If a medical emergency arises and you need to call 911, inform the first responders that you are being tested for the virus that causes COVID-19. Next, notify the local health department in your county.  Adhere to all guidance set forth by the [place] for [facility] of patients that is based on guidance from the Center for Disease Control and Prevention with suspected or confirmed COVID-19. It is provided with this guidance for Persons Under Investigation.  Your health and the health of our community are our top priorities. Public Health officials remain available to provide assistance and counseling to you about COVID-19 and compliance with this guidance.  Provider: ____________________________________________________________ Date: ______/_____/_________  By signing below, you acknowledge that you have read and agree to comply with this Guidance for Persons Under Investigation. ______________________________________________________________ Date: ______/_____/_________  WHO DO I CALL? You can find a list of local health departments here: [email or web address] Health Department: ____________________________________________________________________ Contact Name: ________________________________________________________________________ Telephone: ___________________________________________________________________________  Marice Potter, [place], Communicable Disease Branch COVID-19 Guidance for Persons Under Investigation March 26, 2018

## 2019-04-25 NOTE — Assessment & Plan Note (Signed)
Presenting with Cough and Diarrhea. May be related to COVID-19. She had a negative text after her exposure. However, now that she is symptomatic, I recommended repeat testing. I counseled her regarding self-isolation, face-masking as well as good hand hygiene. She will be out of work pending COVID-19 result. ED precaution discussed. Continue oral hydration and Tylenol as needed for headache and pain. She agreed with the plan.

## 2019-04-26 ENCOUNTER — Ambulatory Visit: Payer: Medicaid Other | Attending: Internal Medicine

## 2019-04-26 ENCOUNTER — Other Ambulatory Visit: Payer: Self-pay | Admitting: Family Medicine

## 2019-04-26 DIAGNOSIS — Z20822 Contact with and (suspected) exposure to covid-19: Secondary | ICD-10-CM

## 2019-04-26 MED ORDER — ATORVASTATIN CALCIUM 10 MG PO TABS: 10.0000 mg | ORAL_TABLET | Freq: Every day | ORAL | 2 refills | Status: DC

## 2019-04-26 MED ORDER — LISINOPRIL 5 MG PO TABS: 5.0000 mg | ORAL_TABLET | Freq: Every day | ORAL | 2 refills | Status: DC

## 2019-04-27 ENCOUNTER — Encounter: Payer: Self-pay | Admitting: Family Medicine

## 2019-04-27 ENCOUNTER — Other Ambulatory Visit: Payer: Self-pay | Admitting: Family Medicine

## 2019-04-27 LAB — SARS-COV-2, NAA 2 DAY TAT

## 2019-04-27 LAB — NOVEL CORONAVIRUS, NAA: SARS-CoV-2, NAA: NOT DETECTED

## 2019-04-28 ENCOUNTER — Encounter: Payer: Self-pay | Admitting: Family Medicine

## 2019-07-03 ENCOUNTER — Other Ambulatory Visit: Payer: Self-pay

## 2019-07-03 ENCOUNTER — Telehealth: Payer: Self-pay

## 2019-07-03 ENCOUNTER — Ambulatory Visit (INDEPENDENT_AMBULATORY_CARE_PROVIDER_SITE_OTHER): Payer: Medicaid Other | Admitting: Family Medicine

## 2019-07-03 VITALS — BP 99/80 | HR 85 | Temp 98.4°F | Wt 216.0 lb

## 2019-07-03 DIAGNOSIS — R8290 Unspecified abnormal findings in urine: Secondary | ICD-10-CM

## 2019-07-03 DIAGNOSIS — R5382 Chronic fatigue, unspecified: Secondary | ICD-10-CM

## 2019-07-03 DIAGNOSIS — E1040 Type 1 diabetes mellitus with diabetic neuropathy, unspecified: Secondary | ICD-10-CM

## 2019-07-03 LAB — POCT URINALYSIS DIP (MANUAL ENTRY)
Bilirubin, UA: NEGATIVE
Glucose, UA: 500 mg/dL — AB
Ketones, POC UA: NEGATIVE mg/dL
Leukocytes, UA: NEGATIVE
Nitrite, UA: POSITIVE — AB
Protein Ur, POC: NEGATIVE mg/dL
Spec Grav, UA: 1.02 (ref 1.010–1.025)
Urobilinogen, UA: 2 E.U./dL — AB
pH, UA: 7 (ref 5.0–8.0)

## 2019-07-03 LAB — POCT UA - MICROSCOPIC ONLY

## 2019-07-03 LAB — POCT GLYCOSYLATED HEMOGLOBIN (HGB A1C): HbA1c, POC (controlled diabetic range): 8.9 % — AB (ref 0.0–7.0)

## 2019-07-03 MED ORDER — CEPHALEXIN 500 MG PO CAPS: 500.0000 mg | ORAL_CAPSULE | Freq: Four times a day (QID) | ORAL | 0 refills | Status: AC

## 2019-07-03 NOTE — Patient Instructions (Signed)
Thank you for coming to see me today. It was a pleasure! Today we talked about:   I have sent an antibiotic to your pharmacy. Please take keflex 4 times per day for 7 days for your urinary tract infection. I will release your result son mychart and call if anything is abnormal. Please try the melatonin Saturday night before bed to help you get more sleep.   Please follow-up with our clinic if your symptoms worsen or do not improve or sooner as needed. Please follow up with your endocrinologist as soon as you can.   If you have any questions or concerns, please do not hesitate to call the office at [phone].  Take Care,   [place] Shanik [place], DO

## 2019-07-03 NOTE — Telephone Encounter (Signed)
Called patient and advised if symptoms worsen between now and afternoon appointment to go to ED or UC.  ED precautions given.   Talbot Grumbling, RN

## 2019-07-03 NOTE — Telephone Encounter (Signed)
Thanks. Also give ED precaution while awaiting her PM appointment.

## 2019-07-03 NOTE — Telephone Encounter (Signed)
Patient calls nurse line to report abdominal and leg cramping. Patient states that she was having diarrhea last week, however, is no longer having at this time. Patient denies vomiting, fever, dizziness or pain with urination. Patient does report strong smell to urine. Patient states that she has been trying to drink more fluids, however she is still having cramps.   Patient scheduled for this afternoon in AT[facility].   Forwarding to PCP and Dr. Junius Finner, RN

## 2019-07-03 NOTE — Progress Notes (Signed)
   SUBJECTIVE:   CHIEF COMPLAINT / HPI:   Fatigue: Patient reports that over the weekend she was sick with episodes of diarrhea.  She has history of T1DM.  She states that last week she was feeling ill and came in today because she was still not feeling well.  She reports that she has been drinking plenty of water in order to avoid going into DKA.  She reports she has gone into DKA before.  She reports that she has also noticed some foul-smelling urine.  She states that she has had a little bit of abdominal pain.  She states that overall she has felt fatigued before and this was attributed to her poor sleep.  She reports she is still having trouble sleeping.  Patient states that she follows with her endocrinologist the need to schedule an appoint with him in order to get better control of her diabetes.  She denies any fevers or chills at this time.  PERTINENT  PMH / PSH: T1DM  OBJECTIVE:  BP 99/80   Pulse 85   Temp 98.4 F (36.9 C) (Oral)   Wt 216 lb (98 kg)   LMP 07/02/2019   SpO2 99%   BMI 33.83 kg/m   General: NAD, pleasant Neck: Supple Respiratory: normal work of breathing Psych: AOx3, appropriate affect  ASSESSMENT/PLAN:   Foul smelling urine Patient feeling fatigued with foul-smelling urine and UA with signs of UTI, no ketones suggestive of DKA at this time. No CVA tenderness or fevers, so less concern for pyelonephritis. Plan for 7 day course of Keflex as patient found improvement with this in the past. Strict return precautions given. Follow up in 2 weeks if no improvement.  -Keflex 500mg  4 times daily x7days -F/u if no improvement   Fatigue Fatigue likely multifactorial due to uncontrolled diabetes, decreased sleep and current UTI.  Patient encouraged to follow-up with endocrinologist for better diabetic control.  A1c collected in office 8.9.  Patient encouraged to try melatonin prior to going to bed at night in order to increase the amount of sleep she is getting. -BMP  WNL, CBC WNL, TSH, B12 collected also WNL -PHQ-9 today with a score of 6 -Patient encouraged to follow-up if she continues to feel fatigued after trialing melatonin and following up with her endocrinologist in order to determine any other possible causes.   [place] Ixel Boehning, DO PGY-3, [facility]

## 2019-07-04 LAB — CBC
Hematocrit: 36.3 % (ref 34.0–46.6)
Hemoglobin: 12.2 g/dL (ref 11.1–15.9)
MCH: 31 pg (ref 26.6–33.0)
MCHC: 33.6 g/dL (ref 31.5–35.7)
MCV: 92 fL (ref 79–97)
Platelets: 376 10*3/uL (ref 150–450)
RBC: 3.93 x10E6/uL (ref 3.77–5.28)
RDW: 11.3 % — ABNORMAL LOW (ref 11.7–15.4)
WBC: 8.3 10*3/uL (ref 3.4–10.8)

## 2019-07-04 LAB — BASIC METABOLIC PANEL
BUN/Creatinine Ratio: 17 (ref 9–23)
BUN: 11 mg/dL (ref 6–24)
CO2: 19 mmol/L — ABNORMAL LOW (ref 20–29)
Calcium: 9 mg/dL (ref 8.7–10.2)
Chloride: 103 mmol/L (ref 96–106)
Creatinine, Ser: 0.66 mg/dL (ref 0.57–1.00)
GFR calc Af Amer: 127 mL/min/{1.73_m2} (ref >59)
GFR calc non Af Amer: 110 mL/min/{1.73_m2} (ref >59)
Glucose: 303 mg/dL — ABNORMAL HIGH (ref 65–99)
Potassium: 4.6 mmol/L (ref 3.5–5.2)
Sodium: 138 mmol/L (ref 134–144)

## 2019-07-04 LAB — TSH: TSH: 2.57 u[IU]/mL (ref 0.450–4.500)

## 2019-07-04 LAB — VITAMIN B12: Vitamin B-12: 841 pg/mL (ref 232–1245)

## 2019-07-07 NOTE — Assessment & Plan Note (Addendum)
Patient feeling fatigued with foul-smelling urine and UA with signs of UTI, no ketones suggestive of DKA at this time. No CVA tenderness or fevers, so less concern for pyelonephritis. Plan for 7 day course of Keflex as patient found improvement with this in the past. Strict return precautions given. Follow up in 2 weeks if no improvement.  -Keflex 500mg  4 times daily x7days -F/u if no improvement

## 2019-07-07 NOTE — Assessment & Plan Note (Addendum)
Fatigue likely multifactorial due to uncontrolled diabetes, decreased sleep and current UTI.  Patient encouraged to follow-up with endocrinologist for better diabetic control.  A1c collected in office 8.9.  Patient encouraged to try melatonin prior to going to bed at night in order to increase the amount of sleep she is getting. -BMP WNL, CBC WNL, TSH, B12 collected also WNL -PHQ-9 today with a score of 6 -Patient encouraged to follow-up if she continues to feel fatigued after trialing melatonin and following up with her endocrinologist in order to determine any other possible causes.

## 2019-07-18 ENCOUNTER — Ambulatory Visit (INDEPENDENT_AMBULATORY_CARE_PROVIDER_SITE_OTHER): Payer: Medicaid Other | Admitting: Family Medicine

## 2019-07-18 ENCOUNTER — Other Ambulatory Visit: Payer: Self-pay

## 2019-07-18 ENCOUNTER — Encounter: Payer: Self-pay | Admitting: Family Medicine

## 2019-07-18 VITALS — BP 112/60 | HR 88 | Ht 67.0 in | Wt 220.2 lb

## 2019-07-18 DIAGNOSIS — Z30431 Encounter for routine checking of intrauterine contraceptive device: Secondary | ICD-10-CM | POA: Diagnosis not present

## 2019-07-18 DIAGNOSIS — N939 Abnormal uterine and vaginal bleeding, unspecified: Secondary | ICD-10-CM | POA: Diagnosis not present

## 2019-07-18 NOTE — Progress Notes (Signed)
   SUBJECTIVE:   Barbara Clark is a 44 yo [id number] here for Mirena IUD removal and reinsertion. No GYN concerns.  Last pap smear was on 07/29/2018 and was normal.  IUD Removal and Reinsertion  Patient identified, informed consent performed, consent signed.   Discussed risks of irregular bleeding, cramping, infection, malpositioning or misplacement of the IUD outside the uterus which may require further procedures. Also advised to use backup contraception for one week as the risk of pregnancy is higher during the transition period of removing an IUD and replacing it with another one. Time out was performed. Speculum placed in the vagina. The strings of the IUD were unable to be visualized.  Dr. Gwendlyn Deutscher was consulted and attempted to do blind resection of IUD but was unable to.  Patient tolerated procedure well.   Patient was given referral to OB/GYN in order to have IUD removed and replaced given that we were unable to visualize the strings.  Patient is otherwise asymptomatic and not having any abdominal pain.     [place] Jefry Lesinski, DO PGY-3, [facility]

## 2019-07-18 NOTE — Patient Instructions (Signed)
Thank you for coming to see me today. It was a pleasure! Today we talked about:   I have placed a referral for OB/GYN in order to have your IUD removed and reinserted as we were not able to see her strings here in the clinic to safely remove it.  They will call you in the next 1 to 2 weeks to schedule an appointment.  Please follow-up with our clinic as needed.  If you have any questions or concerns, please do not hesitate to call the office at [phone].  Take Care,   [place] Montae Stager, DO

## 2019-07-21 ENCOUNTER — Emergency Department (HOSPITAL_COMMUNITY): Payer: Medicaid Other

## 2019-07-21 ENCOUNTER — Other Ambulatory Visit: Payer: Self-pay

## 2019-07-21 ENCOUNTER — Emergency Department (HOSPITAL_COMMUNITY)
Admission: EM | Admit: 2019-07-21 | Discharge: 2019-07-21 | Disposition: A | Discharge status: Home / Self Care | Payer: Medicaid Other | Attending: Emergency Medicine | Admitting: Emergency Medicine

## 2019-07-21 ENCOUNTER — Emergency Department (HOSPITAL_COMMUNITY): Payer: Self-pay

## 2019-07-21 ENCOUNTER — Encounter (HOSPITAL_COMMUNITY): Payer: Self-pay | Admitting: Emergency Medicine

## 2019-07-21 DIAGNOSIS — Z79899 Other long term (current) drug therapy: Secondary | ICD-10-CM | POA: Insufficient documentation

## 2019-07-21 DIAGNOSIS — I10 Essential (primary) hypertension: Secondary | ICD-10-CM | POA: Diagnosis not present

## 2019-07-21 DIAGNOSIS — Y9389 Activity, other specified: Secondary | ICD-10-CM | POA: Insufficient documentation

## 2019-07-21 DIAGNOSIS — M545 Low back pain, unspecified: Secondary | ICD-10-CM

## 2019-07-21 DIAGNOSIS — Y929 Unspecified place or not applicable: Secondary | ICD-10-CM | POA: Diagnosis not present

## 2019-07-21 DIAGNOSIS — S0990XA Unspecified injury of head, initial encounter: Secondary | ICD-10-CM | POA: Diagnosis present

## 2019-07-21 DIAGNOSIS — Z794 Long term (current) use of insulin: Secondary | ICD-10-CM | POA: Insufficient documentation

## 2019-07-21 DIAGNOSIS — E109 Type 1 diabetes mellitus without complications: Secondary | ICD-10-CM | POA: Diagnosis not present

## 2019-07-21 DIAGNOSIS — Z87891 Personal history of nicotine dependence: Secondary | ICD-10-CM | POA: Diagnosis not present

## 2019-07-21 DIAGNOSIS — Y998 Other external cause status: Secondary | ICD-10-CM | POA: Insufficient documentation

## 2019-07-21 DIAGNOSIS — M542 Cervicalgia: Secondary | ICD-10-CM | POA: Diagnosis not present

## 2019-07-21 HISTORY — DX: Essential (primary) hypertension: I10

## 2019-07-21 MED ORDER — ACETAMINOPHEN 325 MG PO TABS
650.0000 mg | ORAL_TABLET | Freq: Once | ORAL | Status: AC
  Administered 2019-07-21: 650 mg via ORAL
  Filled 2019-07-21: qty 2
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
## 2019-07-21 NOTE — Discharge Instructions (Addendum)
Tylenol every 4 hours for pain.  Ice to areas of soreness  Follow up with your Physician for recheck.
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
## 2019-07-26 ENCOUNTER — Encounter: Payer: Self-pay | Admitting: Family Medicine

## 2019-07-26 ENCOUNTER — Other Ambulatory Visit: Payer: Self-pay

## 2019-07-26 ENCOUNTER — Ambulatory Visit (INDEPENDENT_AMBULATORY_CARE_PROVIDER_SITE_OTHER): Payer: Medicaid Other | Admitting: Family Medicine

## 2019-07-26 DIAGNOSIS — M25511 Pain in right shoulder: Secondary | ICD-10-CM | POA: Diagnosis present

## 2019-07-26 DIAGNOSIS — M545 Low back pain: Secondary | ICD-10-CM

## 2019-07-26 MED ORDER — CYCLOBENZAPRINE HCL 5 MG PO TABS: 5.0000 mg | ORAL_TABLET | Freq: Every day | ORAL | 0 refills | Status: DC

## 2019-07-26 NOTE — Patient Instructions (Signed)
Thank you for coming in to see [place] today! Please see below to review our plan for today's visit:  1. Take Flexeril (muscle relaxer) 5mg  at night about 30 minutes to 1 hr before bedtime. This medication is "as needed".  2. Take Aleve 500mg  twice daily with Tylenol 500mg  three times daily. 3. I encourage you to soak in a nice warm bath, get a massage or see a chiropractor (Dr. Susie Cassette at [facility], [phone])  Please call the clinic at [phone] if your symptoms worsen or you have any concerns. It was our pleasure to serve you!   Dr. Milus Banister [facility]
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
## 2019-07-28 NOTE — Assessment & Plan Note (Signed)
Patient with continued diffuse back and body pains, hypertonicity of musculature appreciated on physical exam.  She was given the following recommendations: 1. Take Flexeril (muscle relaxer) 5mg  at night about 30 minutes to 1 hr before bedtime. This medication is "as needed".  2. Take Aleve 500mg  twice daily with Tylenol 500mg  three times daily. 3. I encourage you to soak in a nice warm bath, get a massage or see a chiropractor (Dr. Susie Cassette at [facility], [phone])

## 2019-08-04 ENCOUNTER — Ambulatory Visit (INDEPENDENT_AMBULATORY_CARE_PROVIDER_SITE_OTHER): Payer: Medicaid Other | Admitting: Family Medicine

## 2019-08-04 ENCOUNTER — Other Ambulatory Visit: Payer: Self-pay

## 2019-08-04 ENCOUNTER — Encounter: Payer: Self-pay | Admitting: Family Medicine

## 2019-08-04 VITALS — BP 118/64 | HR 94 | Wt 218.4 lb

## 2019-08-04 DIAGNOSIS — F339 Major depressive disorder, recurrent, unspecified: Secondary | ICD-10-CM | POA: Diagnosis not present

## 2019-08-04 DIAGNOSIS — E1040 Type 1 diabetes mellitus with diabetic neuropathy, unspecified: Secondary | ICD-10-CM

## 2019-08-04 MED ORDER — CYCLOBENZAPRINE HCL 7.5 MG PO TABS: 7.5000 mg | ORAL_TABLET | Freq: Every day | ORAL | 0 refills | Status: DC
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
## 2019-08-04 NOTE — Assessment & Plan Note (Signed)
Patient continues to have back pain with tenderness to palpation and hypertonicity noted on physical exam.  Patient has seen a chiropractor and has been taking Flexeril which helped some but she would like an increased dose.  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  I discussed Worker's Compensation with the patient and she reports that she has not been directed by her employer to a [employer] provider.  She is speaking with an attorney who is brought up [place] meeting with chiropractor -Physical therapy referral placed -Increase Flexeril to 7.5 mg nightly 30 minutes to 1 hour before bedtime -Recommended Biofreeze
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
## 2019-08-15 ENCOUNTER — Other Ambulatory Visit: Payer: Self-pay

## 2019-08-15 ENCOUNTER — Ambulatory Visit (INDEPENDENT_AMBULATORY_CARE_PROVIDER_SITE_OTHER): Payer: Medicaid Other | Admitting: Family Medicine

## 2019-08-15 ENCOUNTER — Encounter: Payer: Self-pay | Admitting: Family Medicine

## 2019-08-15 VITALS — BP 118/78 | HR 107 | Wt 217.5 lb

## 2019-08-15 DIAGNOSIS — E1040 Type 1 diabetes mellitus with diabetic neuropathy, unspecified: Secondary | ICD-10-CM

## 2019-08-15 DIAGNOSIS — E108 Type 1 diabetes mellitus with unspecified complications: Secondary | ICD-10-CM

## 2019-08-15 DIAGNOSIS — M545 Low back pain, unspecified: Secondary | ICD-10-CM

## 2019-08-15 DIAGNOSIS — F339 Major depressive disorder, recurrent, unspecified: Secondary | ICD-10-CM | POA: Diagnosis not present

## 2019-08-15 MED ORDER — TRAMADOL HCL 50 MG PO TABS: 50.0000 mg | ORAL_TABLET | Freq: Two times a day (BID) | ORAL | 0 refills | Status: AC | PRN

## 2019-08-15 MED ORDER — KETOROLAC TROMETHAMINE 30 MG/ML IJ SOLN
30.0000 mg | Freq: Once | INTRAMUSCULAR | Status: AC
  Administered 2019-08-15: 30 mg via INTRAMUSCULAR

## 2019-08-15 NOTE — Assessment & Plan Note (Signed)
She is compliant with neurology follow-up. Continue insulin pump. Prefers A1C check with her endocrinologist which is appropriate. F/U with me as needed.
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
## 2019-08-15 NOTE — Assessment & Plan Note (Signed)
Stable on current regimen   

## 2019-08-15 NOTE — Patient Instructions (Signed)
Acute Back Pain, Adult Acute back pain is sudden and usually short-lived. It is often caused by an injury to the muscles and tissues in the back. The injury may result from:  A muscle or ligament getting overstretched or torn (strained). Ligaments are tissues that connect bones to each other. Lifting something improperly can cause a back strain.  Wear and tear (degeneration) of the spinal disks. Spinal disks are circular tissue that provides cushioning between the bones of the spine (vertebrae).  Twisting motions, such as while playing sports or doing yard work.  A hit to the back.  Arthritis. You may have a physical exam, lab tests, and imaging tests to find the cause of your pain. Acute back pain usually goes away with rest and home care. Follow these instructions at home: Managing pain, stiffness, and swelling  Take over-the-counter and prescription medicines only as told by your health care provider.  Your health care provider may recommend applying ice during the first 24-48 hours after your pain starts. To do this: ? Put ice in a plastic bag. ? Place a towel between your skin and the bag. ? Leave the ice on for 20 minutes, 2-3 times a day.  If directed, apply heat to the affected area as often as told by your health care provider. Use the heat source that your health care provider recommends, such as a moist heat pack or a heating pad. ? Place a towel between your skin and the heat source. ? Leave the heat on for 20-30 minutes. ? Remove the heat if your skin turns bright red. This is especially important if you are unable to feel pain, heat, or cold. You have a greater risk of getting burned. Activity   Do not stay in bed. Staying in bed for more than 1-2 days can delay your recovery.  Sit up and stand up straight. Avoid leaning forward when you sit, or hunching over when you stand. ? If you work at a desk, sit close to it so you do not need to lean over. Keep your chin tucked  in. Keep your neck drawn back, and keep your elbows bent at a right angle. Your arms should look like the letter "L." ? Sit high and close to the steering wheel when you drive. Add lower back (lumbar) support to your car seat, if needed.  Take short walks on even surfaces as soon as you are able. Try to increase the length of time you walk each day.  Do not sit, drive, or stand in one place for more than 30 minutes at a time. Sitting or standing for long periods of time can put stress on your back.  Do not drive or use heavy machinery while taking prescription pain medicine.  Use proper lifting techniques. When you bend and lift, use positions that put less stress on your back: ? Bend your knees. ? Keep the load close to your body. ? Avoid twisting.  Exercise regularly as told by your health care provider. Exercising helps your back heal faster and helps prevent back injuries by keeping muscles strong and flexible.  Work with a physical therapist to make a safe exercise program, as recommended by your health care provider. Do any exercises as told by your physical therapist. Lifestyle  Maintain a healthy weight. Extra weight puts stress on your back and makes it difficult to have good posture.  Avoid activities or situations that make you feel anxious or stressed. Stress and anxiety increase muscle   tension and can make back pain worse. Learn ways to manage anxiety and stress, such as through exercise. General instructions  Sleep on a firm mattress in a comfortable position. Try lying on your side with your knees slightly bent. If you lie on your back, put a pillow under your knees.  Follow your treatment plan as told by your health care provider. This may include: ? Cognitive or behavioral therapy. ? Acupuncture or massage therapy. ? Meditation or yoga. Contact a health care provider if:  You have pain that is not relieved with rest or medicine.  You have increasing pain going down  into your legs or buttocks.  Your pain does not improve after 2 weeks.  You have pain at night.  You lose weight without trying.  You have a fever or chills. Get help right away if:  You develop new bowel or bladder control problems.  You have unusual weakness or numbness in your arms or legs.  You develop nausea or vomiting.  You develop abdominal pain.  You feel faint. Summary  Acute back pain is sudden and usually short-lived.  Use proper lifting techniques. When you bend and lift, use positions that put less stress on your back.  Take over-the-counter and prescription medicines and apply heat or ice as directed by your health care provider. This information is not intended to replace advice given to you by your health care provider. Make sure you discuss any questions you have with your health care provider. Document Revised: 04/26/2018 Document Reviewed: 08/19/2016 Elsevier Patient Education  2020 [employer]  
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
## 2019-08-17 ENCOUNTER — Other Ambulatory Visit: Payer: Self-pay | Admitting: Family Medicine

## 2019-08-17 ENCOUNTER — Telehealth: Payer: Self-pay | Admitting: *Deleted

## 2019-08-17 DIAGNOSIS — S39012A Strain of muscle, fascia and tendon of lower back, initial encounter: Secondary | ICD-10-CM

## 2019-08-17 DIAGNOSIS — S161XXA Strain of muscle, fascia and tendon at neck level, initial encounter: Secondary | ICD-10-CM

## 2019-08-17 MED ORDER — CYCLOBENZAPRINE HCL 7.5 MG PO TABS: 7.5000 mg | ORAL_TABLET | Freq: Every day | ORAL | 0 refills | Status: DC | PRN

## 2019-08-17 NOTE — Telephone Encounter (Signed)
Prior approval for tramadol completed via [employer].  Med approved for 08/17/2019 - 09/16/2019. LMOVM of [facility] informing of approval.  Christen Bame, CMA

## 2019-08-17 NOTE — Progress Notes (Signed)
Reorder PT for patient's acute cervical muscle strain and acute lumbar myofascial strain.

## 2019-08-29 ENCOUNTER — Other Ambulatory Visit: Payer: Self-pay

## 2019-08-29 ENCOUNTER — Encounter: Payer: Self-pay | Admitting: Family Medicine

## 2019-08-29 ENCOUNTER — Telehealth: Payer: Self-pay | Admitting: Family Medicine

## 2019-08-29 ENCOUNTER — Ambulatory Visit (INDEPENDENT_AMBULATORY_CARE_PROVIDER_SITE_OTHER): Payer: Medicaid Other | Admitting: Family Medicine

## 2019-08-29 VITALS — BP 122/78 | HR 100 | Ht 67.0 in | Wt 217.0 lb

## 2019-08-29 DIAGNOSIS — E1040 Type 1 diabetes mellitus with diabetic neuropathy, unspecified: Secondary | ICD-10-CM

## 2019-08-29 DIAGNOSIS — M25511 Pain in right shoulder: Secondary | ICD-10-CM | POA: Diagnosis not present

## 2019-08-29 DIAGNOSIS — E669 Obesity, unspecified: Secondary | ICD-10-CM

## 2019-08-29 DIAGNOSIS — Z1159 Encounter for screening for other viral diseases: Secondary | ICD-10-CM | POA: Diagnosis not present

## 2019-08-29 DIAGNOSIS — M545 Low back pain, unspecified: Secondary | ICD-10-CM

## 2019-08-29 NOTE — Patient Instructions (Signed)

## 2019-08-29 NOTE — Telephone Encounter (Signed)
Pt is here requesting a sling to hold up her arm.
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
## 2019-08-29 NOTE — Assessment & Plan Note (Signed)
Stable on current regimen. F/U ophthal and endoc appointments as scheduled.

## 2019-08-29 NOTE — Assessment & Plan Note (Signed)
Counseling provided on diet and exercise. Monitor closely.

## 2019-08-29 NOTE — Assessment & Plan Note (Signed)
Pain persists. PT and imaging via workers comp. I wrote her out of work for two more weeks. Continue Tramadol as needed. She will contact us to let us know the name of her new medication.

## 2019-08-29 NOTE — Assessment & Plan Note (Addendum)
No acute findings on exam. I reviewed her xray done on 08/18/19 via [facility] - no fracture or shoulder dislocation. Note that I gave her a follow-up call after this visit to clarify her new pain medicine prescribed by her orthopedic (she still does not have the pill bottle with her at the time I called her). The patient however, informed me that she now has worsening of her shoulder swelling. I discussed use of warm compression. ED visit if no improvement. She agreed with the plan.

## 2019-08-29 NOTE — Telephone Encounter (Signed)
Called patient and let her know that she can pick up sling over the counter at any pharmacy. Mayfield Schoene,CMA

## 2019-08-30 LAB — HEPATITIS C ANTIBODY: Hep C Virus Ab: <0.1 s/co ratio (ref 0.0–0.9)

## 2019-09-04 ENCOUNTER — Encounter: Payer: Self-pay | Admitting: Podiatry

## 2019-09-04 ENCOUNTER — Other Ambulatory Visit: Payer: Self-pay

## 2019-09-04 ENCOUNTER — Ambulatory Visit: Payer: Medicaid Other | Admitting: Podiatry

## 2019-09-04 ENCOUNTER — Ambulatory Visit (INDEPENDENT_AMBULATORY_CARE_PROVIDER_SITE_OTHER): Payer: Medicaid Other

## 2019-09-04 DIAGNOSIS — E1342 Other specified diabetes mellitus with diabetic polyneuropathy: Secondary | ICD-10-CM | POA: Diagnosis not present

## 2019-09-04 MED ORDER — PREGABALIN 75 MG PO CAPS: 75.0000 mg | ORAL_CAPSULE | Freq: Two times a day (BID) | ORAL | 0 refills | Status: DC

## 2019-09-04 NOTE — Patient Instructions (Signed)
Pregabalin capsules What is this medicine? PREGABALIN (pre GAB a lin) is used to treat nerve pain from diabetes, shingles, spinal cord injury, and fibromyalgia. It is also used to control seizures in epilepsy. This medicine may be used for other purposes; ask your health care provider or pharmacist if you have questions. COMMON BRAND NAME(S): Lyrica What should I tell my health care provider before I take this medicine? They need to know if you have any of these conditions:  heart disease  history of drug abuse or alcohol abuse problem  kidney disease  lung or breathing disease  suicidal thoughts, plans, or attempt; a previous suicide attempt by you or a family member  an unusual or allergic reaction to pregabalin, gabapentin, other medicines, foods, dyes, or preservatives  pregnant or trying to get pregnant  breast-feeding How should I use this medicine? Take this medicine by mouth with a glass of water. Follow the directions on the prescription label. You can take it with or without food. If it upsets your stomach, take it with food. Take your medicine at regular intervals. Do not take it more often than directed. Do not stop taking except on your doctor's advice. A special MedGuide will be given to you by the pharmacist with each prescription and refill. Be sure to read this information carefully each time. Talk to your pediatrician regarding the use of this medicine in children. While this drug may be prescribed for children as young as 1 month for selected conditions, precautions do apply. Overdosage: If you think you have taken too much of this medicine contact a poison control center or emergency room at once. NOTE: This medicine is only for you. Do not share this medicine with others. What if I miss a dose? If you miss a dose, take it as soon as you can. If it is almost time for your next dose, take only that dose. Do not take double or extra doses. What may interact with this  medicine? This medicine may interact with the following medications:  alcohol  antihistamines for allergy, cough, and cold  certain medicines for anxiety or sleep  certain medicines for depression like amitriptyline, fluoxetine, sertraline  certain medicines for diabetes  certain medicines for seizures like phenobarbital, primidone  general anesthetics like halothane, isoflurane, methoxyflurane, propofol  local anesthetics like lidocaine, pramoxine, tetracaine  medicines that relax muscles for surgery  narcotic medicines for pain  phenothiazines like chlorpromazine, mesoridazine, prochlorperazine, thioridazine This list may not describe all possible interactions. Give your health care provider a list of all the medicines, herbs, non-prescription drugs, or dietary supplements you use. Also tell them if you smoke, drink alcohol, or use illegal drugs. Some items may interact with your medicine. What should I watch for while using this medicine? Tell your doctor or healthcare professional if your symptoms do not start to get better or if they get worse. Visit your doctor or health care professional for regular checks on your progress. Do not stop taking except on your doctor's advice. You may develop a severe reaction. Your doctor will tell you how much medicine to take. Wear a medical identification bracelet or chain if you are taking this medicine for seizures, and carry a card that describes your disease and details of your medicine and dosage times. You may get drowsy or dizzy. Do not drive, use machinery, or do anything that needs mental alertness until you know how this medicine affects you. Do not stand or sit up quickly, especially if   you are an older patient. This reduces the risk of dizzy or fainting spells. Alcohol may interfere with the effect of this medicine. Avoid alcoholic drinks. If you have a heart condition, like congestive heart failure, and notice that you are retaining  water and have swelling in your hands or feet, contact your health care provider immediately. The use of this medicine may increase the chance of suicidal thoughts or actions. Pay special attention to how you are responding while on this medicine. Any worsening of mood, or thoughts of suicide or dying should be reported to your health care professional right away. This medicine has caused reduced sperm counts in some men. This may interfere with the ability to father a child. You should talk to your doctor or health care professional if you are concerned about your fertility. Women who become pregnant while using this medicine for seizures may enroll in the [place] American Antiepileptic Drug Pregnancy Registry by calling 1-888-233-2334. This registry collects information about the safety of antiepileptic drug use during pregnancy. What side effects may I notice from receiving this medicine? Side effects that you should report to your doctor or health care professional as soon as possible:  allergic reactions like skin rash, itching or hives, swelling of the face, lips, or tongue  breathing problems  changes in vision  chest pain  confusion  jerking or unusual movements of any part of your body  loss of memory  muscle pain, tenderness, or weakness  suicidal thoughts or other mood changes  swelling of the ankles, feet, hands  unusual bruising or bleeding Side effects that usually do not require medical attention (report to your doctor or health care professional if they continue or are bothersome):  dizziness  drowsiness  dry mouth  headache  nausea  tremors  trouble sleeping  weight gain This list may not describe all possible side effects. Call your doctor for medical advice about side effects. You may report side effects to FDA at 1-800-FDA-1088. Where should I keep my medicine? Keep out of the reach of children. This medicine can be abused. Keep your medicine in a safe  place to protect it from theft. Do not share this medicine with anyone. Selling or giving away this medicine is dangerous and against the law. This medicine may cause accidental overdose and death if it taken by other adults, children, or pets. Mix any unused medicine with a substance like cat litter or coffee grounds. Then throw the medicine away in a sealed container like a sealed bag or a coffee can with a lid. Do not use the medicine after the expiration date. Store at room temperature between 15 and 30 degrees C (59 and 86 degrees F). NOTE: This sheet is a summary. It may not cover all possible information. If you have questions about this medicine, talk to your doctor, pharmacist, or health care provider.  2020 Elsevier/Gold Standard (2018-01-07 13:15:55)  
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
## 2019-09-12 ENCOUNTER — Other Ambulatory Visit: Payer: Self-pay

## 2019-09-12 ENCOUNTER — Ambulatory Visit (INDEPENDENT_AMBULATORY_CARE_PROVIDER_SITE_OTHER): Payer: Medicaid Other | Admitting: Family Medicine

## 2019-09-12 ENCOUNTER — Encounter: Payer: Self-pay | Admitting: Family Medicine

## 2019-09-12 VITALS — BP 118/72 | HR 95 | Wt 219.0 lb

## 2019-09-12 DIAGNOSIS — M25511 Pain in right shoulder: Secondary | ICD-10-CM

## 2019-09-12 DIAGNOSIS — M545 Low back pain, unspecified: Secondary | ICD-10-CM

## 2019-09-12 DIAGNOSIS — R197 Diarrhea, unspecified: Secondary | ICD-10-CM | POA: Diagnosis not present

## 2019-09-12 NOTE — Progress Notes (Addendum)
    SUBJECTIVE:   CHIEF COMPLAINT / HPI:   Back pain/Shoulder pain: Pain still persists. She got cortisone shot in her right shoulder on 09/01/19 which helped some. She starts PT this Friday. 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   Diarrhea: Three days ago after eating medium hot sauce Crab she developed diarrhea with sweatiness and dizziness. She normally eats mild hot suace Crab with no negative effect, this was her first time trying moderate sauce. She had diarrhea throughout last night and one time this morning. She has been drinking Pedialyte to hydrate herself. No N/V. No blood in her stool. No dizziness today.  PERTINENT  PMH / PSH: PMX reviewed.  OBJECTIVE:   BP 118/72   Pulse 95   Wt 219 lb (99.3 kg)   SpO2 98%   BMI 34.30 kg/m   Physical Exam Vitals and nursing note reviewed.  Cardiovascular:     Rate and Rhythm: Normal rate and regular rhythm.     Pulses: Normal pulses.     Heart sounds: Normal heart sounds. No murmur heard.   Pulmonary:     Effort: Pulmonary effort is normal. No respiratory distress.     Breath sounds: Normal breath sounds. No wheezing or rhonchi.  Abdominal:     General: Abdomen is flat. Bowel sounds are normal. There is no distension.     Palpations: Abdomen is soft. There is no mass.     Tenderness: There is no abdominal tenderness.  Musculoskeletal:     Right shoulder: Tenderness present. No swelling. Decreased range of motion.     Left shoulder: No swelling.     Thoracic back: Tenderness present. No swelling or deformity. Decreased range of motion.     Lumbar back: Tenderness present. No swelling or deformity. Decreased range of motion.      ASSESSMENT/PLAN:   Right shoulder pain Little improvement since last visit, although one will expect some improvement by now. Continue Meloxicam and muscle relaxant as prescribed. Starting PT on Friday. Hopefully this will help. Plan to  consider short term disability if no improvement in her recovery process. She will discuss with her employer and worker's Photographer.   Back pain, lumbosacral Little improvement since last visit, although one will expect some improvement by now. Continue Meloxicam and muscle relaxant as prescribed. Starting PT on Friday. Hopefully this will help. Plan to consider short term disability if no improvement in her recovery process. She will discuss with her employer and worker's Photographer. I gave her two more weeks out of work. Consider modified work schedule or task at work for the future.   Diarrhea: Likely reacting to spicy content in her food. No hx of Crab or sea allergy. She had Crab in the past with no reaction. Hydration encouraged. Monitor for improvement. Return precaution discussed.  Counseling provided on COVID-19 vaccination. She is still undecided. Will readdress at subsequent visits.  Barbara Mews, MD [place]

## 2019-09-12 NOTE — Assessment & Plan Note (Signed)
Little improvement since last visit, although one will expect some improvement by now. Continue Meloxicam and muscle relaxant as prescribed. Starting PT on Friday. Hopefully this will help. Plan to consider short term disability if no improvement in her recovery process. She will discuss with her employer and worker's Photographer.

## 2019-09-12 NOTE — Patient Instructions (Signed)

## 2019-09-12 NOTE — Assessment & Plan Note (Signed)
Little improvement since last visit, although one will expect some improvement by now. Continue Meloxicam and muscle relaxant as prescribed. Starting PT on Friday. Hopefully this will help. Plan to consider short term disability if no improvement in her recovery process. She will discuss with her employer and worker's Photographer. I gave her two more weeks out of work. Consider modified work schedule or task at work for the future.

## 2019-09-13 ENCOUNTER — Other Ambulatory Visit: Payer: Self-pay | Admitting: Family Medicine

## 2019-09-13 LAB — HM DIABETES EYE EXAM

## 2019-09-26 ENCOUNTER — Other Ambulatory Visit: Payer: Self-pay

## 2019-09-26 ENCOUNTER — Ambulatory Visit (INDEPENDENT_AMBULATORY_CARE_PROVIDER_SITE_OTHER): Payer: Medicaid Other | Admitting: Family Medicine

## 2019-09-26 ENCOUNTER — Encounter: Payer: Self-pay | Admitting: Family Medicine

## 2019-09-26 DIAGNOSIS — M25511 Pain in right shoulder: Secondary | ICD-10-CM | POA: Diagnosis not present

## 2019-09-26 DIAGNOSIS — H269 Unspecified cataract: Secondary | ICD-10-CM | POA: Insufficient documentation

## 2019-09-26 DIAGNOSIS — M545 Low back pain, unspecified: Secondary | ICD-10-CM

## 2019-09-26 NOTE — Assessment & Plan Note (Signed)
Patient self reported. Obtain records. F/U with eye specialist as planned.

## 2019-09-26 NOTE — Patient Instructions (Signed)

## 2019-09-26 NOTE — Progress Notes (Signed)
    SUBJECTIVE:   CHIEF COMPLAINT / HPI:  Back/Shoulder pain:Started PT about two weeks ago, she completed three sessions already and has an appointment with [facility] today. She is yet to see a Catering manager but does see an NP via her workers comp. Her pain has not improved much despite PT and pain meds. She will like to continue PT with the hope that she will get better soon. He follow-up with orthopedic, Dr. Amedeo Plenty for her steroid shots when needed.  Cataract: Night vision poor. Scheduled for cataract surgery tomorrow.  PERTINENT  PMH / PSH: PMX reviewed  OBJECTIVE:   Vitals:   09/26/19 0846  BP: 104/70  Pulse: 96  SpO2: 98%  Weight: 218 lb 12.8 oz (99.2 kg)  Height: 5\' 7"  (1.702 m)     Physical Exam Vitals and nursing note reviewed.  Cardiovascular:     Rate and Rhythm: Normal rate and regular rhythm.     Pulses: Normal pulses.     Heart sounds: Normal heart sounds. No murmur heard.   Pulmonary:     Effort: No respiratory distress.     Breath sounds: Normal breath sounds. No wheezing or rhonchi.  Musculoskeletal:     Comments: Lumbar tenderness with reduced ROM due to pain. Right shoulder ROM limited by pain. Otherwise, no joint swelling, erythema or deformity.  Neurological:     Mental Status: She is alert.      ASSESSMENT/PLAN:   Back pain, lumbosacral No major improvement since her last visit. Now attending PT and hopefully, this will help speed up her recovery. Continue Mobic and muscle relaxant as needed. I requested a copy of her PT report as well as her Worker's Comp physician report. Going forward, she will require worker's comp letter to support her out of work letter/request.  Right shoulder pain No major improvement of her symptoms. Glad she is now getting PT eval. She will have PT report faxed to me for review. Continue Mobic as prescribed and Flexeril PRN. Will need letter of support from [employer] provider going forward to keep  off work. Better still she can apply for a short term desability.   Cataract: Patient self reported. Obtain records. F/U with eye specialist as planned.  Andrena Mews, MD [place]

## 2019-09-26 NOTE — Assessment & Plan Note (Signed)
No major improvement since her last visit. Now attending PT and hopefully, this will help speed up her recovery. Continue Mobic and muscle relaxant as needed. I requested a copy of her PT report as well as her Worker's Comp physician report. 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

## 2019-09-26 NOTE — Assessment & Plan Note (Signed)
No major improvement of her symptoms. Glad she is now getting PT eval. She will have PT report faxed to me for review. Continue Mobic as prescribed and Flexeril PRN. Will need letter of support from [employer] provider going forward to keep off work. Better still she can apply for a short term desability.

## 2019-10-03 ENCOUNTER — Telehealth: Payer: Self-pay

## 2019-10-03 ENCOUNTER — Other Ambulatory Visit: Payer: Self-pay

## 2019-10-03 ENCOUNTER — Encounter: Payer: Self-pay | Admitting: Obstetrics and Gynecology

## 2019-10-03 ENCOUNTER — Ambulatory Visit (INDEPENDENT_AMBULATORY_CARE_PROVIDER_SITE_OTHER): Payer: Medicaid Other | Admitting: Obstetrics and Gynecology

## 2019-10-03 ENCOUNTER — Encounter: Payer: Self-pay | Admitting: Family Medicine

## 2019-10-03 VITALS — BP 112/75 | HR 96 | Ht 67.0 in | Wt 221.8 lb

## 2019-10-03 DIAGNOSIS — Z975 Presence of (intrauterine) contraceptive device: Secondary | ICD-10-CM | POA: Diagnosis not present

## 2019-10-03 DIAGNOSIS — Z538 Procedure and treatment not carried out for other reasons: Secondary | ICD-10-CM | POA: Diagnosis not present

## 2019-10-03 NOTE — Telephone Encounter (Signed)
Patient calls nurse line due to development of cold sore on lip. Patient reports swollen bump on lip. Patient reports previous history, several years ago with cold sore. Patient has received Valtrex in the past.  Please advise further management of if patient needs to be seen in the office for evaluation.   Talbot Grumbling, RN

## 2019-10-03 NOTE — Telephone Encounter (Signed)
Called Pt to advise of [place] appointment scheduled for 10/11/19 @ 2pm, arrive at 1:45 w/ full bladder.No answer, left VM.

## 2019-10-03 NOTE — Telephone Encounter (Signed)
I have not prescribed Valtrex to her. Last documented use of Valtrex was in 2012. Since this is a new issue, please advise appointment for evaluation and treatment.

## 2019-10-03 NOTE — Progress Notes (Signed)
   IUD Removal Procedure Note   Patient is 44 y.o. [id number] who is here for Mirena IUD removal and reinserstion. She would like IUD removed secondary to expired, would like new one placed. Unable to be removed at PCP. She has had no issues with IUD. She has no other complaints today. Reviewed risks of removal including pain, bleeding, difficult removal and inability to remove IUD which may require hysteroscopic removal in OR. She affirms that she would like IUD removed.  BP 112/75   Pulse 96   Ht 5\' 7"  (1.702 m)   Wt 221 lb 12.8 oz (100.6 kg)   BMI 34.74 kg/m   Patient with normal appearing external female genitalia. Graves speculum placed in vagina and no IUD strings visualized. IUD hook used to gently attempt to grasp IUD with no success. Several attempts made and procedure discontinued. Minimal bleeding noted. All instruments removed from vagina. Patient tolerated procedure very well.    She was given post removal instructions.   For [place] and return for follow up   K. Arvilla Meres, M.D. Attending Center for [employer] farm manager)

## 2019-10-04 NOTE — Telephone Encounter (Signed)
Patient called and informed of below. Patient reports starting Abreva. Patient reports some improvement but still notes swelling of her upper lip. Advised patient she could use ice (with barrier) to the area for no more than 15 minutes at a time for swelling and discomfort. Strict return precautions given.  Patient has follow up appointment with PCP next Friday.   Talbot Grumbling, RN
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
## 2019-10-11 ENCOUNTER — Ambulatory Visit
Admission: RE | Admit: 2019-10-11 | Discharge: 2019-10-11 | Disposition: A | Discharge status: Home / Self Care | Payer: Medicaid Other | Source: Ambulatory Visit | Attending: Obstetrics and Gynecology | Admitting: Obstetrics and Gynecology

## 2019-10-11 ENCOUNTER — Other Ambulatory Visit: Payer: Self-pay

## 2019-10-11 DIAGNOSIS — Z538 Procedure and treatment not carried out for other reasons: Secondary | ICD-10-CM

## 2019-10-11 DIAGNOSIS — Z975 Presence of (intrauterine) contraceptive device: Secondary | ICD-10-CM | POA: Diagnosis present

## 2019-10-12 ENCOUNTER — Other Ambulatory Visit: Payer: Self-pay | Admitting: Podiatry

## 2019-10-12 NOTE — Telephone Encounter (Signed)
Please advise 

## 2019-10-13 ENCOUNTER — Ambulatory Visit (INDEPENDENT_AMBULATORY_CARE_PROVIDER_SITE_OTHER): Payer: Medicaid Other | Admitting: Family Medicine

## 2019-10-13 ENCOUNTER — Other Ambulatory Visit: Payer: Self-pay

## 2019-10-13 ENCOUNTER — Encounter: Payer: Self-pay | Admitting: Family Medicine

## 2019-10-13 VITALS — BP 102/60 | HR 102 | Ht 67.0 in | Wt 222.4 lb

## 2019-10-13 DIAGNOSIS — M545 Low back pain, unspecified: Secondary | ICD-10-CM

## 2019-10-13 DIAGNOSIS — M25511 Pain in right shoulder: Secondary | ICD-10-CM

## 2019-10-13 DIAGNOSIS — J069 Acute upper respiratory infection, unspecified: Secondary | ICD-10-CM | POA: Diagnosis not present

## 2019-10-13 NOTE — Patient Instructions (Signed)

## 2019-10-13 NOTE — Assessment & Plan Note (Signed)
Home shoulder exercise instruction provided. F/U PT and continue current pain regimen.

## 2019-10-13 NOTE — Progress Notes (Signed)
    SUBJECTIVE:   CHIEF COMPLAINT / HPI:   Back/Shoulder pain: Going to the pool for water aerobic exercise and, twice a week to physical therapy. She seems to be improving slowly. She was meant to get an MRI of her back last week. However, she got nervous and was unable to complete her exam. She sees her disability doctor today.  Cough: She was coughing this weekend x 2 days, but now resolved. Now some soreness on the right side of her chest. No fever. No known exposure to COVID-19.  PERTINENT  PMH / PSH: PMX reviewed  OBJECTIVE:   BP 102/60   Pulse (!) 102   Ht 5\' 7"  (1.702 m)   Wt 222 lb 6.4 oz (100.9 kg)   SpO2 97%   BMI 34.83 kg/m   Physical Exam Vitals and nursing note reviewed. Exam conducted with a chaperone present.  HENT:     Right Ear: Tympanic membrane normal.     Left Ear: Tympanic membrane normal.  Cardiovascular:     Rate and Rhythm: Normal rate and regular rhythm.     Heart sounds: Normal heart sounds. No murmur heard.   Pulmonary:     Effort: Pulmonary effort is normal. No respiratory distress.     Breath sounds: Normal breath sounds. No stridor. No wheezing or rhonchi.  Abdominal:     General: Bowel sounds are normal. There is no distension.     Palpations: Abdomen is soft. There is no mass.     Tenderness: There is no abdominal tenderness.  Musculoskeletal:     Right shoulder: Decreased range of motion.     Left shoulder: Normal range of motion.     Cervical back: Neck supple. Tenderness present.     Lumbar back: Tenderness present. Decreased range of motion.     Right lower leg: No edema.     Left lower leg: No edema.  Neurological:     Mental Status: She is alert.      ASSESSMENT/PLAN:   Right shoulder pain Home shoulder exercise instruction provided. F/U PT and continue current pain regimen.  Back pain, lumbosacral Stable on current pain regimen. F/U with MRI as planned.    URI: Resolving I helped her schedule COVID-19 appointment at  [facility]. Order placed.  Andrena Mews, MD [place]

## 2019-10-13 NOTE — Assessment & Plan Note (Signed)
Stable on current pain regimen. F/U with MRI as planned.

## 2019-10-16 ENCOUNTER — Other Ambulatory Visit: Payer: Medicaid Other

## 2019-10-16 ENCOUNTER — Other Ambulatory Visit: Payer: Self-pay

## 2019-10-16 DIAGNOSIS — Z20822 Contact with and (suspected) exposure to covid-19: Secondary | ICD-10-CM

## 2019-10-17 LAB — SARS-COV-2, NAA 2 DAY TAT

## 2019-10-17 LAB — NOVEL CORONAVIRUS, NAA: SARS-CoV-2, NAA: NOT DETECTED

## 2019-10-26 ENCOUNTER — Other Ambulatory Visit: Payer: Self-pay | Admitting: Family Medicine

## 2019-10-26 ENCOUNTER — Telehealth: Payer: Self-pay | Admitting: Lactation Services

## 2019-10-26 NOTE — Telephone Encounter (Signed)
Attempted to call patient with results of [place] and recommendation for follow up. Patient did not answer. LM for patient to call the office for results at her convenience.

## 2019-10-26 NOTE — Telephone Encounter (Signed)
-----   Message from Sloan Leiter, MD sent at 10/26/2019  3:39 PM EDT ----- IUD present in uterus, fibroids noted Needs follow up appt with me to discuss hysteroscopy

## 2019-10-27 ENCOUNTER — Telehealth (INDEPENDENT_AMBULATORY_CARE_PROVIDER_SITE_OTHER): Payer: Medicaid Other | Admitting: Lactation Services

## 2019-10-27 DIAGNOSIS — Z712 Person consulting for explanation of examination or test findings: Secondary | ICD-10-CM

## 2019-10-27 NOTE — Telephone Encounter (Signed)
Called patient and was not able to reach her. Asked her to call the office at her convenience and that I was sending her a My Chart message.   My Chart Message sent.

## 2019-10-27 NOTE — Telephone Encounter (Signed)
Patient called back and requested a call back .   Returned patients call. She reports she did receive message from office. She has a follow up appt with Dr. Rosana Hoes on 10/18 to discuss Hysteroscopy.   Patient reports her mother had a history of Fibroids.   Patients questions answered.

## 2019-10-30 ENCOUNTER — Telehealth: Payer: Self-pay | Admitting: Podiatry

## 2019-10-30 NOTE — Telephone Encounter (Signed)
Patient called stating that over the last month she has been paranoid and having issues and believes it may be the Lyrica. I discussed with her to wean off of the medication over the next couple of days. Will hold any further medications for now. If symptoms continue recommended her to see her PCP for this.

## 2019-11-06 ENCOUNTER — Other Ambulatory Visit: Payer: Self-pay

## 2019-11-06 ENCOUNTER — Ambulatory Visit (INDEPENDENT_AMBULATORY_CARE_PROVIDER_SITE_OTHER): Payer: Medicaid Other | Admitting: Obstetrics and Gynecology

## 2019-11-06 ENCOUNTER — Encounter: Payer: Self-pay | Admitting: Obstetrics and Gynecology

## 2019-11-06 VITALS — BP 138/77 | HR 97 | Wt 228.0 lb

## 2019-11-06 DIAGNOSIS — D259 Leiomyoma of uterus, unspecified: Secondary | ICD-10-CM | POA: Diagnosis not present

## 2019-11-06 DIAGNOSIS — Z975 Presence of (intrauterine) contraceptive device: Secondary | ICD-10-CM | POA: Diagnosis not present

## 2019-11-07 ENCOUNTER — Encounter: Payer: Self-pay | Admitting: Obstetrics and Gynecology

## 2019-11-07 NOTE — Progress Notes (Signed)
GYNECOLOGY OFFICE FOLLOW UP NOTE  History:  44 y.o. [id number] here today for follow up for displaced IUD within the uterus, large fibroids.   Past Medical History:  Diagnosis Date  . Abnormality of gait 12/26/2012  . Anxiety   . Chronic leg pain 04/27/2014  . Depression   . Diabetes mellitus    Type I  . DKA (diabetic ketoacidosis) ([place]) 09/03/2017  . HSV-2 (herpes simplex virus 2) infection 2012   serology  . Hypertension   . Limb weakness 08/04/2013  . Menorrhagia 04/27/2014  . Right leg weakness 02/20/2014  . Stuttering 02/02/2013    Past Surgical History:  Procedure Laterality Date  . TUBAL LIGATION      Current Outpatient Medications:  .  atorvastatin (LIPITOR) 10 MG tablet, Take 1 tablet (10 mg total) by mouth at bedtime., Disp: 90 tablet, Rfl: 2 .  cyclobenzaprine (FEXMID) 7.5 MG tablet, TAKE 1 TABLET (7.5 MG TOTAL) BY MOUTH DAILY AS NEEDED FOR MUSCLE SPASMS., Disp: 20 tablet, Rfl: 0 .  insulin aspart (NOVOLOG) 100 UNIT/ML injection, USE VIA INSULIN PUMP (MAX 100 UNITS DAILY) 30 DAYS SUBCUTANEOUS 30 DAYS (Patient not taking: Reported on 10/03/2019), Disp: , Rfl:  .  Insulin Human (INSULIN PUMP) SOLN, Inject into the skin. Patient uses novolog 1.6 units/hr in insulin pump., Disp: , Rfl:  .  lisinopril (ZESTRIL) 5 MG tablet, Take 1 tablet (5 mg total) by mouth daily., Disp: 90 tablet, Rfl: 2 .  meloxicam (MOBIC) 7.5 MG tablet, Take 7.5 mg by mouth daily. (Patient not taking: Reported on 10/03/2019), Disp: , Rfl:  .  Multiple Vitamin (MULTIVITAMIN) capsule, Take 1 capsule by mouth daily., Disp: , Rfl:  .  pregabalin (LYRICA) 75 MG capsule, TAKE 1 CAPSULE BY MOUTH TWICE A DAY, Disp: 60 capsule, Rfl: 0 .  vitamin B-12 (CYANOCOBALAMIN) 100 MCG tablet, Take 100 mcg by mouth daily., Disp: , Rfl:   The following portions of the patient's history were reviewed and updated as appropriate: allergies, current medications, past family history, past medical history, past social history, past  surgical history and problem list.   Review of Systems:  Pertinent items noted in HPI and remainder of comprehensive ROS otherwise negative.   Objective:  Physical Exam BP 138/77   Pulse 97   Wt 228 lb (103.4 kg)   BMI 35.71 kg/m  CONSTITUTIONAL: Well-developed, well-nourished female in no acute distress.  HENT:  Normocephalic, atraumatic. External right and left ear normal. Oropharynx is clear and moist EYES: Conjunctivae and EOM are normal. Pupils are equal, round, and reactive to light. No scleral icterus.  NECK: Normal range of motion, supple, no masses SKIN: Skin is warm and dry. No rash noted. Not diaphoretic. No erythema. No pallor. NEUROLOGIC: Alert and oriented to person, place, and time. Normal reflexes, muscle tone coordination. No cranial nerve deficit noted. PSYCHIATRIC: Normal mood and affect. Normal behavior. Normal judgment and thought content. CARDIOVASCULAR: Normal heart rate noted RESPIRATORY: Effort normal, no problems with respiration noted ABDOMEN: Soft, no distention noted.   PELVIC: deferred MUSCULOSKELETAL: Normal range of motion. No edema noted.  Exam done with chaperone present.  Labs and Imaging US PELVIC COMPLETE WITH TRANSVAGINAL  Result Date: 10/11/2019 CLINICAL DATA:  Pain IUD strings not visible EXAM: TRANSABDOMINAL AND TRANSVAGINAL ULTRASOUND OF PELVIS TECHNIQUE: Both transabdominal and transvaginal ultrasound examinations of the pelvis were performed. Transabdominal technique was performed for global imaging of the pelvis including uterus, ovaries, adnexal regions, and pelvic cul-de-sac. It was necessary to proceed with endovaginal  exam following the transabdominal exam to visualize the uterus, endometrium, ovaries and adnexa. COMPARISON:  05/04/2014 FINDINGS: Uterus Measurements: 14.7 x 7.9 x 9.1 cm = volume: 556 mL. Numerous fibroids, at least 3. The largest a left anterior fibroid measuring up to 6.9 cm. Posterior right fibroid measures up to 5.5  cm. Fundal fibroid measures up to 2.6 cm. Endometrium Thickness: 14 mm in thickness where visualized. IUD not well visualized but in the endometrium, displaced by fibroids. No focal abnormality visualized. Right ovary Measurements: 3.2 x 2.7 x 1.4 cm = volume: 6.2 mL. Normal appearance/no adnexal mass. Left ovary Measurements: 3.7 x 2.1 x 3.1 cm = volume: 12.4 mL. Normal appearance/no adnexal mass. Other findings No abnormal free fluid. IMPRESSION: Enlarged fibroid uterus. IUD difficult to visualize due to fibroids but is seen within the endometrium. Electronically Signed   By: Rolm Baptise M.D.   On: 10/11/2019 15:25    Assessment & Plan:  1. Uterine leiomyoma, unspecified location - given enlarged bulky uterus, to optimize minimally invasive surgery, recommend RA-TLH, BS with Dr. Ihor Dow, Dr. Sheldon Silvan aware, will need pre-op consultation with Dr. Sheldon Silvan - pt agreeable to this plan - of note, patient's most recent A1C is 8.9, sent message that she will need to have significantly improved blood sugar control prior to surgery and recommended she see PCP to optimize management  2. IUD (intrauterine device) in place Removal with hysterectomy   Routine preventative health maintenance measures emphasized. Please refer to After Visit Summary for other counseling recommendations.   Return for will contact patient with results for follow up.  Total face-to-face time with patient: 22 minutes. Over 50% of encounter was spent on counseling and coordination of care.  Feliz Beam, M.D. Attending Center for [employer] farm manager)

## 2019-11-17 ENCOUNTER — Encounter: Payer: Self-pay | Admitting: Family Medicine

## 2019-11-17 ENCOUNTER — Other Ambulatory Visit: Payer: Self-pay | Admitting: Family Medicine

## 2019-11-17 MED ORDER — LOSARTAN POTASSIUM 25 MG PO TABS: 25.0000 mg | ORAL_TABLET | Freq: Every day | ORAL | 1 refills | Status: DC

## 2019-11-27 LAB — BASIC METABOLIC PANEL
BUN: 9 (ref 4–21)
CO2: 27 — AB (ref 13–22)
Chloride: 103 (ref 99–108)
Creatinine: 0.7 (ref 0.5–1.1)
Glucose: 209
Potassium: 4.3 (ref 3.4–5.3)
Sodium: 137 (ref 137–147)

## 2019-11-27 LAB — HEPATIC FUNCTION PANEL
ALT: 14 (ref 7–35)
AST: 11 — AB (ref 13–35)
Alkaline Phosphatase: 114 (ref 25–125)
Bilirubin, Total: 0.3

## 2019-11-27 LAB — LIPID PANEL
Cholesterol: 94 (ref 0–200)
HDL: 32 — AB (ref 35–70)
LDL Cholesterol: 46
Triglycerides: 74 (ref 40–160)

## 2019-11-27 LAB — HEMOGLOBIN A1C: Hemoglobin A1C: 7.9

## 2019-11-27 LAB — COMPREHENSIVE METABOLIC PANEL
Albumin: 3.9 (ref 3.5–5.0)
Calcium: 9.6 (ref 8.7–10.7)
GFR calc Af Amer: 115

## 2019-11-27 LAB — TSH: TSH: 3.24 (ref 0.41–5.90)

## 2019-11-27 LAB — MICROALBUMIN, URINE: Microalb, Ur: 4.4

## 2019-11-29 ENCOUNTER — Other Ambulatory Visit: Payer: Self-pay

## 2019-11-29 ENCOUNTER — Encounter: Payer: Self-pay | Admitting: Obstetrics & Gynecology

## 2019-11-29 ENCOUNTER — Ambulatory Visit (INDEPENDENT_AMBULATORY_CARE_PROVIDER_SITE_OTHER): Payer: Medicaid Other | Admitting: Obstetrics & Gynecology

## 2019-11-29 VITALS — BP 114/71 | HR 107 | Ht 67.0 in | Wt 228.0 lb

## 2019-11-29 DIAGNOSIS — N939 Abnormal uterine and vaginal bleeding, unspecified: Secondary | ICD-10-CM | POA: Diagnosis not present

## 2019-11-29 DIAGNOSIS — T8339XA Other mechanical complication of intrauterine contraceptive device, initial encounter: Secondary | ICD-10-CM

## 2019-11-29 NOTE — Progress Notes (Signed)
Pt presented for her preOP PE. She reports that she does not want a hysterectomy. She wants a myomectomy with removal of her IUD. She reports that she has changed her mind. I offered her an ofc hysteroscopy with removal of the IUD but, she wants this done in the OR. Referring pt back to Dr. Rosana Hoes for further management.   Pt is aware that the heavy bleeding will resume if the IUD is removed. She is considering having it replaced after removal but, she is not sure. She will think about this and discuss it further with Dr. Rosana Hoes when she sees her again.   Total face-to-face time with patient was 20 min.  Greater than 50% was spent in counseling and coordination of care with the patient.   Rhyse Skowron L. Harraway-Smith, M.D., [facility] June

## 2019-12-07 ENCOUNTER — Encounter: Payer: Self-pay | Admitting: Family Medicine

## 2019-12-11 NOTE — Telephone Encounter (Signed)
I called and discussed with this patient regarding her cough which was initially attributed to ACEi. Cough persists on Losartan.  I doubt this is the cause of her cough. However, I advised her to hold Losartan for now, if her cough improves, then, it is likely she is coughing from taking ARBs, otherwise, we will investigate other causes.  F/U appointment made.  She agreed with the plan.

## 2019-12-18 ENCOUNTER — Other Ambulatory Visit (HOSPITAL_COMMUNITY): Payer: Medicaid Other

## 2019-12-21 ENCOUNTER — Encounter: Payer: Self-pay | Admitting: Obstetrics and Gynecology

## 2019-12-21 ENCOUNTER — Other Ambulatory Visit: Payer: Self-pay

## 2019-12-21 ENCOUNTER — Ambulatory Visit (INDEPENDENT_AMBULATORY_CARE_PROVIDER_SITE_OTHER): Payer: Medicaid Other | Admitting: Obstetrics and Gynecology

## 2019-12-21 VITALS — BP 128/79 | HR 94 | Resp 16 | Ht 67.0 in | Wt 225.0 lb

## 2019-12-21 DIAGNOSIS — Z975 Presence of (intrauterine) contraceptive device: Secondary | ICD-10-CM

## 2019-12-21 DIAGNOSIS — D259 Leiomyoma of uterus, unspecified: Secondary | ICD-10-CM

## 2019-12-21 NOTE — Progress Notes (Signed)
GYNECOLOGY OFFICE FOLLOW UP NOTE  History:  44 y.o. [id number] here today for follow up for abnormal uterine bleeding. Had been referred to Dr. Ihor Dow for robotic assisted TLH given bulky uterus. She is still having heavy bleeding and pain. Has decided against hysterectomy and would like myomectomy and to have IUD removed.  Past Medical History:  Diagnosis Date  . Abnormality of gait 12/26/2012  . Anxiety   . Chronic leg pain 04/27/2014  . Depression   . Diabetes mellitus    Type I  . DKA (diabetic ketoacidosis) ([place]) 09/03/2017  . HSV-2 (herpes simplex virus 2) infection 2012   serology  . Hypertension   . Limb weakness 08/04/2013  . Menorrhagia 04/27/2014  . Right leg weakness 02/20/2014  . Stuttering 02/02/2013    Past Surgical History:  Procedure Laterality Date  . TUBAL LIGATION       Current Outpatient Medications:  .  ACCU-CHEK GUIDE test strip, , Disp: , Rfl:  .  Biotin 1 MG CAPS, Take 1 tablet by mouth daily., Disp: , Rfl:  .  cyclobenzaprine (FEXMID) 7.5 MG tablet, TAKE 1 TABLET (7.5 MG TOTAL) BY MOUTH DAILY AS NEEDED FOR MUSCLE SPASMS., Disp: 20 tablet, Rfl: 1 .  insulin aspart (NOVOLOG) 100 UNIT/ML injection, USE VIA INSULIN PUMP (MAX 100 UNITS DAILY) 30 DAYS SUBCUTANEOUS 30 DAYS, Disp: , Rfl:  .  Insulin Human (INSULIN PUMP) SOLN, Inject into the skin. Patient uses novolog 1.6 units/hr in insulin pump., Disp: , Rfl:  .  losartan (COZAAR) 25 MG tablet, Take 1 tablet (25 mg total) by mouth at bedtime., Disp: 90 tablet, Rfl: 1 .  Multiple Vitamin (MULTIVITAMIN) capsule, Take 1 capsule by mouth daily., Disp: , Rfl:  .  vitamin B-12 (CYANOCOBALAMIN) 100 MCG tablet, Take 100 mcg by mouth daily., Disp: , Rfl:  .  atorvastatin (LIPITOR) 10 MG tablet, Take 1 tablet (10 mg total) by mouth at bedtime. (Patient not taking: Reported on 12/21/2019), Disp: 90 tablet, Rfl: 2 .  meloxicam (MOBIC) 7.5 MG tablet, Take 7.5 mg by mouth daily. (Patient not taking: Reported on 10/03/2019),  Disp: , Rfl:  .  pregabalin (LYRICA) 75 MG capsule, TAKE 1 CAPSULE BY MOUTH TWICE A DAY (Patient not taking: Reported on 11/29/2019), Disp: 60 capsule, Rfl: 0  The following portions of the patient's history were reviewed and updated as appropriate: allergies, current medications, past family history, past medical history, past social history, past surgical history and problem list.   Review of Systems:  Pertinent items noted in HPI and remainder of comprehensive ROS otherwise negative.   Objective:  Physical Exam BP 128/79   Pulse 94   Resp 16   Ht 5\' 7"  (1.702 m)   Wt 225 lb (102.1 kg)   BMI 35.24 kg/m  CONSTITUTIONAL: Well-developed, well-nourished female in no acute distress.  HENT:  Normocephalic, atraumatic. External right and left ear normal. Oropharynx is clear and moist EYES: Conjunctivae and EOM are normal. Pupils are equal, round, and reactive to light. No scleral icterus.  NECK: Normal range of motion, supple, no masses SKIN: Skin is warm and dry. No rash noted. Not diaphoretic. No erythema. No pallor. NEUROLOGIC: Alert and oriented to person, place, and time. Normal reflexes, muscle tone coordination. No cranial nerve deficit noted. PSYCHIATRIC: Normal mood and affect. Normal behavior. Normal judgment and thought content. CARDIOVASCULAR: Normal heart rate noted RESPIRATORY: Effort normal, no problems with respiration noted ABDOMEN: Soft, no distention noted.   PELVIC: deferred MUSCULOSKELETAL: Normal range of  motion. No edema noted.  Labs and Imaging No results found.  Assessment & Plan:   1. IUD (intrauterine device) in place Needs hysteroscopic removal, failed removal in office  2. Uterine leiomyoma, unspecified location - Patient would like myomectomy versus hysterectomy - Reviewed option for hysteroscopic/laparoscopic radiofrequency ablation of fibroids rather than removal, she is open to discussing this - will have her follow up with Dr. Sabra Heck to discuss  options   Routine preventative health maintenance measures emphasized. Please refer to After Visit Summary for other counseling recommendations.   Return for Followup.  Total face-to-face time with patient: 22 minutes. Over 50% of encounter was spent on counseling and coordination of care.  Feliz Beam, M.D. Attending Center for [employer] farm manager)

## 2019-12-22 ENCOUNTER — Ambulatory Visit (HOSPITAL_COMMUNITY)
Admission: RE | Admit: 2019-12-22 | Discharge: 2019-12-22 | Disposition: A | Discharge status: Home / Self Care | Payer: Medicaid Other | Source: Ambulatory Visit | Attending: Family Medicine | Admitting: Family Medicine

## 2019-12-22 ENCOUNTER — Encounter: Payer: Self-pay | Admitting: Family Medicine

## 2019-12-22 ENCOUNTER — Ambulatory Visit (INDEPENDENT_AMBULATORY_CARE_PROVIDER_SITE_OTHER): Payer: Medicaid Other | Admitting: Family Medicine

## 2019-12-22 ENCOUNTER — Other Ambulatory Visit: Payer: Self-pay

## 2019-12-22 VITALS — BP 124/70 | HR 93 | Ht 67.0 in | Wt 226.6 lb

## 2019-12-22 DIAGNOSIS — E1040 Type 1 diabetes mellitus with diabetic neuropathy, unspecified: Secondary | ICD-10-CM

## 2019-12-22 DIAGNOSIS — Z23 Encounter for immunization: Secondary | ICD-10-CM

## 2019-12-22 DIAGNOSIS — R053 Chronic cough: Secondary | ICD-10-CM | POA: Diagnosis present

## 2019-12-22 NOTE — Assessment & Plan Note (Signed)
I doubt this is related to her ACEi or ARBs. Still holding Losartan. Chest xray ordered to assess for PNA. Will consider treatment for allergies if xray is normal. Will likely refer for PFT to assess for COPD being a former smoker. She agreed with the plan.

## 2019-12-22 NOTE — Addendum Note (Signed)
Addended by: Andrena Mews T on: 12/22/2019 03:49 PM   Modules accepted: Orders

## 2019-12-22 NOTE — Patient Instructions (Signed)
It was nice seeing you today. I am sorry about your cough. I doubt your cough is due to Losartan. Let us get an xray of your chest to check for infection. You  Might be able to get back on Losartan in the next few days since it appears it is not causing your cough.

## 2019-12-22 NOTE — Assessment & Plan Note (Signed)
She came in with lab report from her endocrinologist's office. Lab reviewed and was manually updated on Epic. A1C of 7.9. Urine micro albumin 4.4 - Holding Losartan for now. Will likely need to resume soon. Continue insulin pump management per endo.

## 2019-12-22 NOTE — Progress Notes (Signed)
    SUBJECTIVE:   CHIEF COMPLAINT / HPI:   Cough This is a chronic problem. The current episode started more than 1 month ago (Cough started > 2 months ago). The problem has been unchanged. The problem occurs every few minutes. Cough characteristics: Occasionally productive of whitish sputum with no blood in it. Associated symptoms comments: She denies fever, + intermittent SOB with cough. No wheezing or chest pain. Exacerbated by: Unclear what aggravates her. She d/c Lisinopril and Losartan with no improvement in her cough. She has tried nothing for the symptoms. There is no history of asthma. Former tobacco user   DM1: She follow-up with her Endocrinologist. She came in with lab report from her endocrinologist's clinic for me to review and upload to her record.  Paresthesia: She was on Lyrica. However, this was recently d/ced by her podiatrist for s/e Cheron Every). Her symptoms stopped since she has been off Lyrica.  PERTINENT  PMH / PSH: PMX reviewed.  OBJECTIVE:   BP 124/70   Pulse 93   Ht 5\' 7"  (1.702 m)   Wt 226 lb 9.6 oz (102.8 kg)   SpO2 100%   BMI 35.49 kg/m   Physical Exam Vitals and nursing note reviewed.  Cardiovascular:     Rate and Rhythm: Normal rate and regular rhythm.     Heart sounds: Normal heart sounds. No murmur heard.   Pulmonary:     Effort: Pulmonary effort is normal. No respiratory distress.     Breath sounds: Normal breath sounds. No wheezing or rhonchi.  Abdominal:     General: Abdomen is flat. Bowel sounds are normal. There is no distension.     Palpations: Abdomen is soft. There is no mass.     Tenderness: There is no abdominal tenderness.  Musculoskeletal:     Right lower leg: No edema.     Left lower leg: No edema.      ASSESSMENT/PLAN:   Chronic cough I doubt this is related to her ACEi or ARBs. Still holding Losartan. Chest xray ordered to assess for PNA. Will consider treatment for allergies if xray is normal. Will likely refer for PFT  to assess for COPD being a former smoker. She agreed with the plan.  Diabetes mellitus type I ([place]) She came in with lab report from her endocrinologist's office. Lab reviewed and was manually updated on Epic. A1C of 7.9. Urine micro albumin 4.4 - Holding Losartan for now. Will likely need to resume soon. Continue insulin pump management per endo.  DM neuropathy, type I diabetes mellitus Stable off Lyrica which caused paranoa. I reviewed her podiatrist's note from August with Lyrica tapering instruction. F/U with podiatrist as scheduled.     Health maintenance: Flu shot given today. She declined COVID-19 vaccine today. Will readdress in the future.  Andrena Mews, MD [place]

## 2019-12-22 NOTE — Progress Notes (Signed)
Patient ID: Barbara Clark, female   DOB: Jun 04, 1975, 44 y.o.   MRN: [id number] Xray result discussed with the patient.  Concerning for callus or lung mass.  CT chest recommended given hx of tobacco use and has been ordered.  Staff will contact her to schedule.  DG Chest 2 View  Result Date: 12/22/2019 CLINICAL DATA:  Chronic cough EXAM: CHEST - 2 VIEW COMPARISON:  07/29/2012 FINDINGS: Cardiac and mediastinal contours normal. Pulmonary vascularity normal. Lungs are clear without infiltrate or effusion Lobular density in the lateral right upper lobe has progressed. This may be extrapleural in location. This could be callus related to prior rib fracture. This could also represent a pleural base mass. IMPRESSION: Progression of pleural based density right upper lobe laterally. Possible callus from healing rib fracture versus pleural base mass. Recommend CT chest without contrast for further evaluation. Electronically Signed   By: Franchot Gallo M.D.   On: 12/22/2019 15:30

## 2019-12-22 NOTE — Assessment & Plan Note (Addendum)
Stable off Lyrica which caused paranoa. I reviewed her podiatrist's note from August with Lyrica tapering instruction. F/U with podiatrist as scheduled.

## 2019-12-25 NOTE — Progress Notes (Signed)
Early morning is preferred by patient. [name],CMA

## 2019-12-25 NOTE — Progress Notes (Signed)
Appt made for [facility] on Tuesday Dec. 14 at 8am.  Patient is aware.  Laurelyn Terrero,CMA

## 2019-12-26 ENCOUNTER — Encounter (HOSPITAL_BASED_OUTPATIENT_CLINIC_OR_DEPARTMENT_OTHER): Payer: Self-pay

## 2019-12-26 ENCOUNTER — Ambulatory Visit (HOSPITAL_BASED_OUTPATIENT_CLINIC_OR_DEPARTMENT_OTHER): Admit: 2019-12-26 | Payer: Medicaid Other | Admitting: Obstetrics & Gynecology

## 2019-12-26 SURGERY — HYSTERECTOMY, TOTAL, ROBOT-ASSISTED
Anesthesia: Choice

## 2020-01-01 ENCOUNTER — Other Ambulatory Visit: Payer: Self-pay | Admitting: Family Medicine

## 2020-01-01 ENCOUNTER — Telehealth: Payer: Self-pay | Admitting: Family Medicine

## 2020-01-01 MED ORDER — LORAZEPAM 1 MG PO TABS: 1.0000 mg | ORAL_TABLET | ORAL | 0 refills | Status: AC

## 2020-01-01 NOTE — Telephone Encounter (Signed)
-----   Message from [name], MD sent at 12/29/2019  6:56 AM EST ----- Regarding: sedation

## 2020-01-01 NOTE — Telephone Encounter (Signed)
Patient mentioned that she is claustrophobic and unable to get CT chest without pre medication during our last discussion.  I called to inform her that I have escribed Lorazepam to be taken 30 minutes prior to her CT chest. She verbalized understanding.  I advised her that she cannot drive home by herself if she used this medication. She stated that she will find someone to drive her home after her CT chest.

## 2020-01-02 ENCOUNTER — Ambulatory Visit (HOSPITAL_BASED_OUTPATIENT_CLINIC_OR_DEPARTMENT_OTHER)
Admission: RE | Admit: 2020-01-02 | Discharge: 2020-01-02 | Disposition: A | Discharge status: Home / Self Care | Payer: Medicaid Other | Source: Ambulatory Visit | Attending: Family Medicine | Admitting: Family Medicine

## 2020-01-02 ENCOUNTER — Other Ambulatory Visit: Payer: Self-pay | Admitting: Family Medicine

## 2020-01-02 ENCOUNTER — Other Ambulatory Visit: Payer: Self-pay

## 2020-01-02 ENCOUNTER — Telehealth: Payer: Self-pay | Admitting: Family Medicine

## 2020-01-02 DIAGNOSIS — J948 Other specified pleural conditions: Secondary | ICD-10-CM

## 2020-01-02 DIAGNOSIS — R053 Chronic cough: Secondary | ICD-10-CM

## 2020-01-02 DIAGNOSIS — E108 Type 1 diabetes mellitus with unspecified complications: Secondary | ICD-10-CM

## 2020-01-02 NOTE — Telephone Encounter (Signed)
CT lung report discussed with her.  Need repeat CT chest in 12 months for Lung cancer screen.  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 MRI recommended to further classify.  Patient agreed to proceed with MRI of her chest.  No further questions.

## 2020-01-08 ENCOUNTER — Other Ambulatory Visit: Payer: Self-pay | Admitting: Family Medicine

## 2020-01-08 ENCOUNTER — Telehealth: Payer: Self-pay

## 2020-01-08 DIAGNOSIS — J948 Other specified pleural conditions: Secondary | ICD-10-CM

## 2020-01-08 NOTE — Progress Notes (Signed)
I was unable to cancel her previous MRI order since it has been attached to her appointment. However, I entered a new order of MRI W/WO contrast as requested.

## 2020-01-08 NOTE — Telephone Encounter (Signed)
Please have her come in to be seen soon or go to the ED if she feels dehydrated, weak and with high glucose level.

## 2020-01-08 NOTE — Telephone Encounter (Signed)
Spoke with pt. She stated that she would like for me to cancel the MRI at [facility], and schedule her for [facility] in January. Since their machine is open at the head and feet. She also wanted to let Dr. Gwendlyn Deutscher know that she feels dehydrated . That is has been drinking water. But it is not helping and wanted to know what else can she do. Salvatore Marvel, CMA

## 2020-01-08 NOTE — Telephone Encounter (Signed)
Spoke with pt. Made appt for 12/23 for dehydration. Salvatore Marvel, CMA

## 2020-01-08 NOTE — Telephone Encounter (Signed)
Spoke with [employer] at [facility]. She stated that they would be able to get Barbara Clark in Jan. She is going to call the pt to ask screening question and make the appt then. Salvatore Marvel, CMA

## 2020-01-10 ENCOUNTER — Other Ambulatory Visit: Payer: Self-pay | Admitting: Family Medicine

## 2020-01-10 MED ORDER — LORAZEPAM 1 MG PO TABS: ORAL_TABLET | ORAL | 0 refills | Status: AC

## 2020-01-11 ENCOUNTER — Ambulatory Visit (INDEPENDENT_AMBULATORY_CARE_PROVIDER_SITE_OTHER): Payer: Medicaid Other | Admitting: Family Medicine

## 2020-01-11 ENCOUNTER — Other Ambulatory Visit: Payer: Self-pay

## 2020-01-11 ENCOUNTER — Encounter: Payer: Self-pay | Admitting: Family Medicine

## 2020-01-11 VITALS — BP 128/60 | HR 97 | Wt 224.8 lb

## 2020-01-11 DIAGNOSIS — E1040 Type 1 diabetes mellitus with diabetic neuropathy, unspecified: Secondary | ICD-10-CM | POA: Diagnosis present

## 2020-01-11 DIAGNOSIS — R531 Weakness: Secondary | ICD-10-CM | POA: Diagnosis not present

## 2020-01-11 DIAGNOSIS — R82998 Other abnormal findings in urine: Secondary | ICD-10-CM | POA: Diagnosis not present

## 2020-01-11 LAB — POCT URINALYSIS DIP (MANUAL ENTRY)
Bilirubin, UA: NEGATIVE
Glucose, UA: NEGATIVE mg/dL
Ketones, POC UA: NEGATIVE mg/dL
Leukocytes, UA: NEGATIVE
Nitrite, UA: NEGATIVE
Protein Ur, POC: NEGATIVE mg/dL
Spec Grav, UA: 1.02 (ref 1.010–1.025)
Urobilinogen, UA: 1 E.U./dL
pH, UA: 6 (ref 5.0–8.0)

## 2020-01-11 LAB — POCT UA - MICROSCOPIC ONLY

## 2020-01-11 LAB — GLUCOSE, POCT (MANUAL RESULT ENTRY): POC Glucose: 102 mg/dl — AB (ref 70–99)

## 2020-01-11 NOTE — Progress Notes (Signed)
     SUBJECTIVE:   CHIEF COMPLAINT / HPI:  DM2: She is compliant with her insulin pump. Her home CMG has been pretty good. Readings in the last few days are: 104, 187, 177,147. Some 200s once in a while.  Weakness/Dark urine:  Patient c/o dark urine when she does not drink water for more than 1-2 hours. At the time, she will feel weak and tired. Symptoms improve after she drinks. No cardiopulmonary/neurologic symptoms. In addition to dark urine, her urine smells like ammonia. She urinates more frequently because she drinks a lot. Denies excessive thirst--no vaginal discharge.   PERTINENT  PMH / PSH: PMX reviewed  OBJECTIVE:   BP 128/60   Pulse 97   Wt 224 lb 12.8 oz (102 kg)   LMP 12/12/2019   SpO2 98%   BMI 35.21 kg/m   Physical Exam Vitals and nursing note reviewed.  Cardiovascular:     Rate and Rhythm: Normal rate and regular rhythm.     Heart sounds: Normal heart sounds. No murmur heard.   Pulmonary:     Effort: Pulmonary effort is normal. No respiratory distress.     Breath sounds: Normal breath sounds. No wheezing.  Abdominal:     General: Abdomen is flat. There is no distension.     Palpations: Abdomen is soft. There is no mass.     Tenderness: There is no abdominal tenderness.  Musculoskeletal:     Right lower leg: No edema.     Left lower leg: No edema.  Neurological:     General: No focal deficit present.     Mental Status: She is oriented to person, place, and time.      ASSESSMENT/PLAN:   Diabetes mellitus type I ([place]) CBG checked today: 102 No concern for hyper or hypoglycemia. Continue Insulin pump management per endo.   Dark: urine: UA negative for leukocyte or nitrite. Trace blood in the urine. I called and discussed this result with her. She stated that she is coming off her menses. UA otherwise is reassuring.  Weakness: Etiology unclear. Cmet and CBC checked. She had a recent and normal TSH. Keep hydration as it is. I will contact  her with the result. ED precaution discussed.  Andrena Mews, MD [place]

## 2020-01-11 NOTE — Assessment & Plan Note (Signed)
CBG checked today: 102 No concern for hyper or hypoglycemia. Continue Insulin pump management per endo.

## 2020-01-11 NOTE — Progress Notes (Signed)
CB 

## 2020-01-11 NOTE — Patient Instructions (Signed)
It was nice seeing you today. Your exam is fine today. We will get some urine test and lab work done to see what is going on. Please , call or go the ED if symptoms worsens.

## 2020-01-12 LAB — CMP14+EGFR
ALT: 15 IU/L (ref 0–32)
AST: 13 IU/L (ref 0–40)
Albumin/Globulin Ratio: 1.3 (ref 1.2–2.2)
Albumin: 4.3 g/dL (ref 3.8–4.8)
Alkaline Phosphatase: 162 IU/L — ABNORMAL HIGH (ref 44–121)
BUN/Creatinine Ratio: 12 (ref 9–23)
BUN: 8 mg/dL (ref 6–24)
Bilirubin Total: 0.3 mg/dL (ref 0.0–1.2)
CO2: 23 mmol/L (ref 20–29)
Calcium: 9.5 mg/dL (ref 8.7–10.2)
Chloride: 104 mmol/L (ref 96–106)
Creatinine, Ser: 0.68 mg/dL (ref 0.57–1.00)
GFR calc Af Amer: 125 mL/min/{1.73_m2} (ref >59)
GFR calc non Af Amer: 108 mL/min/{1.73_m2} (ref >59)
Globulin, Total: 3.2 g/dL (ref 1.5–4.5)
Glucose: 92 mg/dL (ref 65–99)
Potassium: 4.6 mmol/L (ref 3.5–5.2)
Sodium: 141 mmol/L (ref 134–144)
Total Protein: 7.5 g/dL (ref 6.0–8.5)

## 2020-01-12 LAB — CBC
Hematocrit: 37.8 % (ref 34.0–46.6)
Hemoglobin: 12.7 g/dL (ref 11.1–15.9)
MCH: 30 pg (ref 26.6–33.0)
MCHC: 33.6 g/dL (ref 31.5–35.7)
MCV: 89 fL (ref 79–97)
Platelets: 478 10*3/uL — ABNORMAL HIGH (ref 150–450)
RBC: 4.23 x10E6/uL (ref 3.77–5.28)
RDW: 11.9 % (ref 11.7–15.4)
WBC: 8.1 10*3/uL (ref 3.4–10.8)

## 2020-01-15 ENCOUNTER — Ambulatory Visit (HOSPITAL_COMMUNITY): Payer: Medicaid Other

## 2020-01-30 ENCOUNTER — Encounter: Payer: Self-pay | Admitting: Family Medicine

## 2020-01-31 ENCOUNTER — Encounter: Payer: Self-pay | Admitting: Family Medicine

## 2020-02-04 ENCOUNTER — Other Ambulatory Visit: Payer: Medicaid Other

## 2020-02-16 ENCOUNTER — Ambulatory Visit (INDEPENDENT_AMBULATORY_CARE_PROVIDER_SITE_OTHER): Payer: Medicaid Other | Admitting: Family Medicine

## 2020-02-16 ENCOUNTER — Other Ambulatory Visit: Payer: Self-pay

## 2020-02-16 ENCOUNTER — Encounter: Payer: Self-pay | Admitting: Family Medicine

## 2020-02-16 VITALS — BP 128/74 | HR 100 | Ht 67.0 in | Wt 229.0 lb

## 2020-02-16 DIAGNOSIS — Z0000 Encounter for general adult medical examination without abnormal findings: Secondary | ICD-10-CM

## 2020-02-16 DIAGNOSIS — R0683 Snoring: Secondary | ICD-10-CM

## 2020-02-16 DIAGNOSIS — Z23 Encounter for immunization: Secondary | ICD-10-CM

## 2020-02-16 DIAGNOSIS — G4730 Sleep apnea, unspecified: Secondary | ICD-10-CM

## 2020-02-16 NOTE — Patient Instructions (Signed)

## 2020-02-16 NOTE — Progress Notes (Addendum)
Subjective:     Barbara Clark is a 45 y.o. female and is here for a comprehensive physical exam. The patient reports problems - snoring for > 2 yrs, now worsening. Sometimes she wakes up SOB probably due to apnea. Sometimes feels sleepy during the day..  Social History   Socioeconomic History  . Marital status: Single    Spouse name: Not on file  . Number of children: Not on file  . Years of education: Not on file  . Highest education level: Not on file  Occupational History  . Not on file  Tobacco Use  . Smoking status: Former Smoker    Packs/day: 1.00    Types: Cigarettes    Quit date: 07/14/2017    Years since quitting: 2.5  . Smokeless tobacco: Never Used  . Tobacco comment: trying to quit  Substance and Sexual Activity  . Alcohol use: Yes    Alcohol/week: 0.0 standard drinks    Comment: rare but on occasion  . Drug use: No  . Sexual activity: Yes    Birth control/protection: Surgical, I.U.D.  Other Topics Concern  . Not on file  Social History Narrative   Pt lives with her two children. Has some college.    Social Determinants of Health   Financial Resource Strain: Not on file  Food Insecurity: Food Insecurity Present  . Worried About Charity fundraiser in the Last Year: Often true  . Ran Out of Food in the Last Year: Often true  Transportation Needs: No Transportation Needs  . Lack of Transportation (Medical): No  . Lack of Transportation (Non-Medical): No  Physical Activity: Not on file  Stress: Not on file  Social Connections: Not on file  Intimate Partner Violence: Not on file   Health Maintenance  Topic Date Due  . COVID-19 Vaccine (1) Never done  . FOOT EXAM  08/14/2020  . TETANUS/TDAP  09/01/2020  . OPHTHALMOLOGY EXAM  09/12/2020  . PAP SMEAR-Modifier  07/28/2021  . INFLUENZA VACCINE  Completed  . PNEUMOCOCCAL POLYSACCHARIDE VACCINE AGE 42-64 HIGH RISK  Completed  . Hepatitis C Screening  Completed  . HIV Screening  Completed    The following  portions of the patient's history were reviewed and updated as appropriate: allergies, current medications, past family history, past medical history, past social history, past surgical history and problem list.  Review of Systems Pertinent items noted in HPI and remainder of comprehensive ROS otherwise negative.   Objective:    BP 128/74   Pulse 100   Ht _0  (1.702 m)   Wt 229 lb (103.9 kg)   SpO2 98%   BMI 35.87 kg/m  General appearance: alert and cooperative Head: Normocephalic, without obvious abnormality, atraumatic Eyes: conjunctivae/corneas clear. PERRL, EOM's intact. Fundi benign. Ears: normal TM's and external ear canals both ears Throat: lips, mucosa, and tongue normal; teeth and gums normal Neck: no adenopathy, no carotid bruit, no JVD, supple, symmetrical, trachea midline and thyroid not enlarged, symmetric, no tenderness/mass/nodules Lungs: clear to auscultation bilaterally Heart: regular rate and rhythm, S1, S2 normal, no murmur, click, rub or gallop Abdomen: soft, non-tender; bowel sounds normal; no masses,  no organomegaly Extremities: extremities normal, atraumatic, no cyanosis or edema Skin: Skin color, texture, turgor normal. No rashes or lesions Neurologic: Alert and oriented X 3, normal strength and tone. Normal symmetric reflexes. Normal coordination and gait   Epworth Sleepiness Scale: 15   Assessment:    Healthy female exam.      Plan:  Need for school physical and vaccination update Up to date with PAP She will get her Tdap today. Check MMR, Varicella titers and Qauntiferon gold for TB screen. Referred to sleep specialist for c/o snoring and apnea. Still declined COVID-19 shot. She will consider in the future.

## 2020-02-18 ENCOUNTER — Other Ambulatory Visit: Payer: Self-pay

## 2020-02-18 ENCOUNTER — Ambulatory Visit
Admission: RE | Admit: 2020-02-18 | Discharge: 2020-02-18 | Disposition: A | Discharge status: Home / Self Care | Payer: Medicaid Other | Source: Ambulatory Visit | Attending: Family Medicine | Admitting: Family Medicine

## 2020-02-18 DIAGNOSIS — J948 Other specified pleural conditions: Secondary | ICD-10-CM

## 2020-02-19 ENCOUNTER — Telehealth: Payer: Self-pay | Admitting: Family Medicine

## 2020-02-19 LAB — QUANTIFERON-TB GOLD PLUS
QuantiFERON Mitogen Value: >10 IU/mL
QuantiFERON Nil Value: 0.03 IU/mL
QuantiFERON TB1 Ag Value: 0.07 IU/mL
QuantiFERON TB2 Ag Value: 0.08 IU/mL
QuantiFERON-TB Gold Plus: NEGATIVE

## 2020-02-19 LAB — MEASLES/MUMPS/RUBELLA IMMUNITY
MUMPS ABS, IGG: 12.1 AU/mL (ref >10.9)
RUBEOLA AB, IGG: <13.5 AU/mL — ABNORMAL LOW (ref >16.4)
Rubella Antibodies, IGG: 0.9 index — ABNORMAL LOW (ref >0.99)

## 2020-02-19 LAB — VARICELLA ZOSTER ANTIBODY, IGM: Varicella IgM: <0.91 index (ref 0.00–0.90)

## 2020-02-19 LAB — HEPATITIS B SURFACE ANTIBODY, QUANTITATIVE: Hepatitis B Surf Ab Quant: 23.7 m[IU]/mL (ref >9.9)

## 2020-02-19 LAB — VARICELLA ZOSTER ANTIBODY, IGG: Varicella zoster IgG: 1761 index (ref >165)

## 2020-02-19 NOTE — Telephone Encounter (Signed)
Patient returns call to nurse line. Attempted to schedule virtual visit per previous note. However, patient states that she has class Monday-Thursday 8-12. Please advise  Talbot Grumbling, RN

## 2020-02-19 NOTE — Telephone Encounter (Signed)
HIPAA compliant callback message left.  Need to go over result with her.  May schedule a virtual/Mychart visit with me tomorrow to review her result and discuss plan with her.

## 2020-02-20 ENCOUNTER — Telehealth: Payer: Self-pay | Admitting: Family Medicine

## 2020-02-20 NOTE — Telephone Encounter (Signed)
Will contact patient after clinic this afternoon.

## 2020-02-20 NOTE — Telephone Encounter (Signed)
Test result discussed. MMR non-immune for the mumps and Rubeola component I recommended revaccination. She will contact nurse clinic to schedule vaccination series.  She mentioned she was unable to get her MRI done because she frick out. She will reschedule and let me know her next appointment so I can call in anxiolytics for her.  I discussed need for routine Lung cancer screen given her hx of smoking. Since she is getting an MRI done, we might not need a CT chest for now.  She verbalized understanding.

## 2020-02-21 ENCOUNTER — Ambulatory Visit (INDEPENDENT_AMBULATORY_CARE_PROVIDER_SITE_OTHER): Payer: Medicaid Other

## 2020-02-21 ENCOUNTER — Other Ambulatory Visit: Payer: Self-pay

## 2020-02-21 DIAGNOSIS — Z23 Encounter for immunization: Secondary | ICD-10-CM | POA: Diagnosis not present

## 2020-02-26 NOTE — Progress Notes (Signed)
Patient presents to nurse clinic for MMR vaccination, per recent titers.   Administered in left arm, site unremarkable, tolerated injection well.   Talbot Grumbling, RN

## 2020-03-11 ENCOUNTER — Ambulatory Visit (INDEPENDENT_AMBULATORY_CARE_PROVIDER_SITE_OTHER): Payer: Medicaid Other | Admitting: Podiatry

## 2020-03-11 ENCOUNTER — Other Ambulatory Visit: Payer: Self-pay

## 2020-03-11 ENCOUNTER — Ambulatory Visit: Payer: Medicaid Other | Admitting: Podiatry

## 2020-03-11 DIAGNOSIS — E119 Type 2 diabetes mellitus without complications: Secondary | ICD-10-CM

## 2020-03-11 DIAGNOSIS — L600 Ingrowing nail: Secondary | ICD-10-CM

## 2020-03-11 DIAGNOSIS — E1142 Type 2 diabetes mellitus with diabetic polyneuropathy: Secondary | ICD-10-CM | POA: Diagnosis not present

## 2020-03-11 NOTE — Patient Instructions (Signed)
Diabetes Mellitus and Foot Care Foot care is an important part of your health, especially when you have diabetes. Diabetes may cause you to have problems because of poor blood flow (circulation) to your feet and legs, which can cause your skin to:  Become thinner and drier.  Break more easily.  Heal more slowly.  Peel and crack. You may also have nerve damage (neuropathy) in your legs and feet, causing decreased feeling in them. This means that you may not notice minor injuries to your feet that could lead to more serious problems. Noticing and addressing any potential problems early is the best way to prevent future foot problems. How to care for your feet Foot hygiene  Wash your feet daily with warm water and mild soap. Do not use hot water. Then, pat your feet and the areas between your toes until they are completely dry. Do not soak your feet as this can dry your skin.  Trim your toenails straight across. Do not dig under them or around the cuticle. File the edges of your nails with an emery board or nail file.  Apply a moisturizing lotion or petroleum jelly to the skin on your feet and to dry, brittle toenails. Use lotion that does not contain alcohol and is unscented. Do not apply lotion between your toes.   Shoes and socks  Wear clean socks or stockings every day. Make sure they are not too tight. Do not wear knee-high stockings since they may decrease blood flow to your legs.  Wear shoes that fit properly and have enough cushioning. Always look in your shoes before you put them on to be sure there are no objects inside.  To break in new shoes, wear them for just a few hours a day. This prevents injuries on your feet. Wounds, scrapes, corns, and calluses  Check your feet daily for blisters, cuts, bruises, sores, and redness. If you cannot see the bottom of your feet, use a mirror or ask someone for help.  Do not cut corns or calluses or try to remove them with medicine.  If you  find a minor scrape, cut, or break in the skin on your feet, keep it and the skin around it clean and dry. You may clean these areas with mild soap and water. Do not clean the area with peroxide, alcohol, or iodine.  If you have a wound, scrape, corn, or callus on your foot, look at it several times a day to make sure it is healing and not infected. Check for: ? Redness, swelling, or pain. ? Fluid or blood. ? Warmth. ? Pus or a bad smell.   General tips  Do not cross your legs. This may decrease blood flow to your feet.  Do not use heating pads or hot water bottles on your feet. They may burn your skin. If you have lost feeling in your feet or legs, you may not know this is happening until it is too late.  Protect your feet from hot and cold by wearing shoes, such as at the beach or on hot pavement.  Schedule a complete foot exam at least once a year (annually) or more often if you have foot problems. Report any cuts, sores, or bruises to your health care provider immediately. Where to find more information  American Diabetes Association: www.diabetes.org  Association of Diabetes Care & Education Specialists: www.diabeteseducator.org Contact a health care provider if:  You have a medical condition that increases your risk of infection and   you have any cuts, sores, or bruises on your feet.  You have an injury that is not healing.  You have redness on your legs or feet.  You feel burning or tingling in your legs or feet.  You have pain or cramps in your legs and feet.  Your legs or feet are numb.  Your feet always feel cold.  You have pain around any toenails. Get help right away if:  You have a wound, scrape, corn, or callus on your foot and: ? You have pain, swelling, or redness that gets worse. ? You have fluid or blood coming from the wound, scrape, corn, or callus. ? Your wound, scrape, corn, or callus feels warm to the touch. ? You have pus or a bad smell coming from  the wound, scrape, corn, or callus. ? You have a fever. ? You have a red line going up your leg. Summary  Check your feet every day for blisters, cuts, bruises, sores, and redness.  Apply a moisturizing lotion or petroleum jelly to the skin on your feet and to dry, brittle toenails.  Wear shoes that fit properly and have enough cushioning.  If you have foot problems, report any cuts, sores, or bruises to your health care provider immediately.  Schedule a complete foot exam at least once a year (annually) or more often if you have foot problems. This information is not intended to replace advice given to you by your health care provider. Make sure you discuss any questions you have with your health care provider. Document Revised: 07/27/2019 Document Reviewed: 07/27/2019 Elsevier Patient Education  2021 Elsevier Inc.  

## 2020-03-15 ENCOUNTER — Other Ambulatory Visit: Payer: Self-pay

## 2020-03-15 ENCOUNTER — Encounter: Payer: Self-pay | Admitting: Family Medicine

## 2020-03-15 ENCOUNTER — Ambulatory Visit (INDEPENDENT_AMBULATORY_CARE_PROVIDER_SITE_OTHER): Payer: Medicaid Other | Admitting: Family Medicine

## 2020-03-15 ENCOUNTER — Ambulatory Visit (INDEPENDENT_AMBULATORY_CARE_PROVIDER_SITE_OTHER): Payer: Medicaid Other

## 2020-03-15 VITALS — BP 94/78 | HR 101 | Ht 67.0 in | Wt 229.0 lb

## 2020-03-15 DIAGNOSIS — Z23 Encounter for immunization: Secondary | ICD-10-CM | POA: Insufficient documentation

## 2020-03-15 NOTE — Patient Instructions (Signed)
  Thank you for coming in to see [place] today! Please see below to review our plan for today's visit:  1. Congratulations on your first Covid vaccine!  Please follow-up with [place] in about 3 weeks for your second vaccine.   Please call the clinic at [phone] if your symptoms worsen or you have any concerns. It was our pleasure to serve you!   Dr. Milus Banister [facility]

## 2020-03-15 NOTE — Progress Notes (Signed)
    SUBJECTIVE:   CHIEF COMPLAINT / HPI:   First COVID Vaccine: this is a pleasant patient presenting for her first COVID vaccine. Vaccine was administered and patient was observed for 15 minutes. She tolerated the vaccine without any complication. She was given strict return precautions and can follow up with [place] as needed.   PERTINENT  PMH / PSH:  Patient Active Problem List   Diagnosis Date Noted  . Encounter for administration of COVID-19 vaccine 03/15/2020  . Chronic cough 12/22/2019  . Cataract 09/26/2019  . De Quervain's tenosynovitis, bilateral 12/20/2018  . Carpal tunnel syndrome on right 11/01/2018  . Obesity (BMI 30-39.9) 10/12/2017  . Back pain, lumbosacral 07/06/2017  . Right shoulder pain 05/07/2015  . 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 Diabetes mellitus type I ([place]) 03/18/2006  . Major depressive disorder, recurrent episode ([place]) 03/18/2006     OBJECTIVE:   BP 94/78   Pulse (!) 101   Ht 5\' 7"  (1.702 m)   Wt 229 lb (103.9 kg)   SpO2 99%   BMI 35.87 kg/m    Physical exam: General: well-appearing, pleasant patient   ASSESSMENT/PLAN:   Encounter for administration of COVID-19 vaccine Patient received Pfizer vaccine 02/25, was observed for 15 minutes without complication     Daisy Floro, [place]

## 2020-03-16 NOTE — Progress Notes (Signed)
Subjective: 45 year old female presents the office today for 26-month evaluation for diabetic foot care. Her last A1c was 7.9. She said that she is no longer taking Lyrica and she feels good. No longer having any nerve symptoms. Her main concern today is her to get ingrown toenail on the right big toe. Denies any redness or drainage or any swelling. Patient becomes uncomfortable. Denies any systemic complaints such as fevers, chills, nausea, vomiting. No acute changes since last appointment, and no other complaints at this time.   Objective: AAO x3, NAD DP/PT pulses palpable bilaterally, CRT less than 3 seconds There is no area of tenderness identified bilaterally. No edema, erythema. There is mild induration present the right hallux toenail any edema, erythema, drainage or pus or signs of infection. No pain with calf compression, swelling, warmth, erythema  Assessment: Ingrown toenail right hallux without signs of infection; diabetic foot exam  Plan: -All treatment options discussed with the patient including all alternatives, risks, complications.  -With IV standpoint she is doing well. Neuropathy symptoms have improved and not taking any medication currently. Discussed daily foot inspection and monitor. Glucose control. -Discussed partial nail removal but she wants to hold off on this. As a courtesy I debrided the nail with any complications or bleeding. If symptoms continue or any worsening will likely need to have a partial nail avulsion. -Patient encouraged to call the office with any questions, concerns, change in symptoms.   Trula Slade DPM

## 2020-03-18 NOTE — Assessment & Plan Note (Signed)
Patient received Pfizer vaccine 02/25, was observed for 15 minutes without complication

## 2020-03-19 HISTORY — PX: EXAM UNDER ANESTHESIA WITH MANIPULATION OF SHOULDER: SHX5817

## 2020-03-20 ENCOUNTER — Ambulatory Visit (INDEPENDENT_AMBULATORY_CARE_PROVIDER_SITE_OTHER): Payer: Medicaid Other

## 2020-03-20 ENCOUNTER — Other Ambulatory Visit: Payer: Self-pay

## 2020-03-20 DIAGNOSIS — Z23 Encounter for immunization: Secondary | ICD-10-CM

## 2020-03-20 NOTE — Progress Notes (Signed)
Patient presents in nurse clinic for #2 MMR vaccine.  MMR administered in her left arm per her request.  A copy of her updated immunization record was given to her for school purposes.

## 2020-04-05 ENCOUNTER — Other Ambulatory Visit: Payer: Self-pay

## 2020-04-05 ENCOUNTER — Ambulatory Visit (INDEPENDENT_AMBULATORY_CARE_PROVIDER_SITE_OTHER): Payer: Medicaid Other

## 2020-04-05 DIAGNOSIS — Z23 Encounter for immunization: Secondary | ICD-10-CM

## 2020-04-05 NOTE — Progress Notes (Signed)
    Covid-19 Vaccination Clinic  Name:  Barbara Clark    MRN: [id number] DOB: [birth year]  04/05/2020  [name] was observed post Covid-19 immunization for 30 minutes based on pre-vaccination screening without incident. Patient reports slight dizziness after receiving first dose.  She was provided with Vaccine Information Sheet and instruction to access the V-Safe system.   [name] was instructed to call 911 with any severe reactions post vaccine: Marland Kitchen Difficulty breathing  . Swelling of face and throat  . A fast heartbeat  . A bad rash all over body  . Dizziness and weakness   Immunizations Administered    Name Date Dose VIS Date Route   PFIZER Comrnaty(Gray TOP) Covid-19 Vaccine 04/05/2020 11:08 AM 0.3 mL 12/28/2019 Intramuscular   Manufacturer: [employer], [employer]   Lot: [id number]   [place]: [phone]     Provided patient with updated immunization card.   Talbot Grumbling, RN

## 2020-04-07 ENCOUNTER — Other Ambulatory Visit: Payer: Medicaid Other

## 2020-04-09 ENCOUNTER — Encounter: Payer: Self-pay | Admitting: Family Medicine

## 2020-04-10 NOTE — Telephone Encounter (Signed)
I called patient to discuss. Patient reports she has been having diarrhea since Saturday morning. Patient does report she is getting better each day. Patient states she thinks she may have a UTI now. Patient reports dark color urine with an ammonia smell. Patient denies fever or chills. Patient reports she has been drinking water and voiding today per normal. ED precautions given to patient for dehydration. Patient scheduled for 3/25 with PCP.

## 2020-04-12 ENCOUNTER — Encounter: Payer: Self-pay | Admitting: Family Medicine

## 2020-04-12 ENCOUNTER — Other Ambulatory Visit (HOSPITAL_COMMUNITY)
Admission: RE | Admit: 2020-04-12 | Discharge: 2020-04-12 | Disposition: A | Discharge status: Home / Self Care | Payer: Medicaid Other | Source: Ambulatory Visit | Attending: Family Medicine | Admitting: Family Medicine

## 2020-04-12 ENCOUNTER — Other Ambulatory Visit: Payer: Self-pay

## 2020-04-12 ENCOUNTER — Ambulatory Visit (INDEPENDENT_AMBULATORY_CARE_PROVIDER_SITE_OTHER): Payer: Medicaid Other | Admitting: Family Medicine

## 2020-04-12 DIAGNOSIS — E1040 Type 1 diabetes mellitus with diabetic neuropathy, unspecified: Secondary | ICD-10-CM

## 2020-04-12 DIAGNOSIS — F339 Major depressive disorder, recurrent, unspecified: Secondary | ICD-10-CM

## 2020-04-12 DIAGNOSIS — N390 Urinary tract infection, site not specified: Secondary | ICD-10-CM

## 2020-04-12 DIAGNOSIS — R34 Anuria and oliguria: Secondary | ICD-10-CM | POA: Diagnosis not present

## 2020-04-12 LAB — POCT URINALYSIS DIP (MANUAL ENTRY)
Bilirubin, UA: NEGATIVE
Blood, UA: NEGATIVE
Glucose, UA: NEGATIVE mg/dL
Ketones, POC UA: NEGATIVE mg/dL
Leukocytes, UA: NEGATIVE
Nitrite, UA: POSITIVE — AB
Protein Ur, POC: NEGATIVE mg/dL
Spec Grav, UA: 1.025 (ref 1.010–1.025)
Urobilinogen, UA: 0.2 E.U./dL
pH, UA: 7 (ref 5.0–8.0)

## 2020-04-12 LAB — POCT WET PREP (WET MOUNT)
Clue Cells Wet Prep Whiff POC: NEGATIVE
Trichomonas Wet Prep HPF POC: ABSENT

## 2020-04-12 LAB — POCT UA - MICROSCOPIC ONLY

## 2020-04-12 MED ORDER — CEPHALEXIN 500 MG PO CAPS: 500.0000 mg | ORAL_CAPSULE | Freq: Two times a day (BID) | ORAL | 0 refills | Status: AC

## 2020-04-12 MED ORDER — FLUCONAZOLE 150 MG PO TABS: 150.0000 mg | ORAL_TABLET | Freq: Once | ORAL | 0 refills | Status: AC

## 2020-04-12 NOTE — Assessment & Plan Note (Signed)
F/U with endo recomended.

## 2020-04-12 NOTE — Assessment & Plan Note (Signed)
Ensure tight glycemic control. Continue insulin pump. F/U with endo recomended.

## 2020-04-12 NOTE — Assessment & Plan Note (Signed)
Stable on current regimen. No SI. F/U with psych as planned.

## 2020-04-12 NOTE — Patient Instructions (Signed)

## 2020-04-12 NOTE — Progress Notes (Signed)
SUBJECTIVE:   CHIEF COMPLAINT / HPI:  Diarrhea  This is a new problem. Episode onset: 6 days ago, after getting her COVID-19 shot. Episode frequency: Initially, she was in and out of the bathroom. Now her stool has improved, now once daily. Stool was watery. The problem has been gradually improving. Diarrhea characteristics: No blood. Pertinent negatives include no abdominal pain, fever or vomiting. Associated symptoms comments: Low grade fever a day after COVID-19 vaccine, but none since then. Exacerbated by: Likely related to [place] shot. No change in diet. She has tried increased fluids for the symptoms. The treatment provided moderate relief.  Urinary Tract Infection  Chronicity: Started few days. Change in urine color with poor hydration. The problem occurs intermittently. Progression since onset: Improves when she drinks a lot of water. The pain is at a severity of 0/10. Pertinent negatives include no vomiting. Associated symptoms comments: No vaginal discharge. She though she might have had yeast infection and she took yoghurt for a while and the symptoms has improved..  Vaginal Itching Primary symptoms comment: C/O vaginal irritation which improved after yoghurt intake. She will like to check for yeast and GC/Chlamydia. She denies recent sexual activity. Associated symptoms include diarrhea. Pertinent negatives include no abdominal pain, fever or vomiting.   Reduced urine output: Not urinating enough despite adequate intake. No other GU or GI concerns, other than what has already been documented.  DM2: Glucose running high. She is concern this might have caused her to have yeast infection. She is compliant with her insulin pump and endocrine f/u.  PERTINENT  PMH / PSH: PMX reviewed  OBJECTIVE:   BP 125/70   Pulse (!) 104   Ht 5\' 7"  (1.702 m)   Wt 229 lb 6.4 oz (104.1 kg)   SpO2 98%   BMI 35.93 kg/m   Physical Exam Vitals and nursing note reviewed. Exam conducted with a  chaperone present Lavell Anchors).  Cardiovascular:     Rate and Rhythm: Normal rate and regular rhythm.     Heart sounds: Normal heart sounds. No murmur heard.   Pulmonary:     Effort: Pulmonary effort is normal. No respiratory distress.     Breath sounds: Normal breath sounds. No wheezing.  Abdominal:     General: Abdomen is flat. Bowel sounds are normal.     Palpations: Abdomen is soft. There is no mass.     Tenderness: There is no abdominal tenderness.  Genitourinary:    Vagina: Vaginal discharge present.     Cervix: Normal.     Uterus: Normal.      Adnexa: Right adnexa normal and left adnexa normal.     Comments: + whitish discharge Musculoskeletal:     Right lower leg: No edema.     Left lower leg: No edema.    [place] Office Visit from 04/12/2020 in [place]  PHQ-9 Total Score 11       ASSESSMENT/PLAN:  Diarrhea: Benign GI exam. Likely viral. Now improving. Continue oral hydration.  UTI symptoms: + Nitrite Urine sent for culture. Will treat for UTI empirically since she is symptomatic and + Nitrite. Given concern for yeast, although neg wet prep, I sent in one dose of Diflucan to use after completing her A/B. This was discussed with her after visit over the telephone. She agreed with the plan.  Vaginitis:  She denies being sexually active, but requested GC/Chlam check. Wet prep negative GC/Chlamydia and HIV checked. I will contact her soon  with result.  Diabetes mellitus type I ([place]) F/U with endo recomended.  DM neuropathy, type I diabetes mellitus Ensure tight glycemic control. Continue insulin pump. F/U with endo recomended.  Major depressive disorder, recurrent episode Stable on current regimen. No SI. F/U with psych as planned.     Andrena Mews, MD [place]

## 2020-04-13 LAB — BASIC METABOLIC PANEL
BUN/Creatinine Ratio: 10 (ref 9–23)
BUN: 7 mg/dL (ref 6–24)
CO2: 20 mmol/L (ref 20–29)
Calcium: 9.5 mg/dL (ref 8.7–10.2)
Chloride: 100 mmol/L (ref 96–106)
Creatinine, Ser: 0.67 mg/dL (ref 0.57–1.00)
Glucose: 127 mg/dL — ABNORMAL HIGH (ref 65–99)
Potassium: 4.5 mmol/L (ref 3.5–5.2)
Sodium: 136 mmol/L (ref 134–144)
eGFR: 112 mL/min/{1.73_m2} (ref >59)

## 2020-04-13 LAB — HIV ANTIBODY (ROUTINE TESTING W REFLEX): HIV Screen 4th Generation wRfx: NONREACTIVE

## 2020-04-15 LAB — CERVICOVAGINAL ANCILLARY ONLY
Chlamydia: NEGATIVE
Comment: NEGATIVE
Comment: NORMAL
Neisseria Gonorrhea: NEGATIVE

## 2020-04-17 LAB — URINE CULTURE

## 2020-04-25 ENCOUNTER — Encounter: Payer: Self-pay | Admitting: Family Medicine

## 2020-05-05 ENCOUNTER — Other Ambulatory Visit: Payer: Self-pay

## 2020-05-05 ENCOUNTER — Ambulatory Visit: Admission: RE | Admit: 2020-05-05 | Payer: Medicaid Other | Source: Ambulatory Visit

## 2020-05-10 ENCOUNTER — Ambulatory Visit (INDEPENDENT_AMBULATORY_CARE_PROVIDER_SITE_OTHER): Payer: Medicaid Other | Admitting: Family Medicine

## 2020-05-10 ENCOUNTER — Encounter: Payer: Self-pay | Admitting: Family Medicine

## 2020-05-10 ENCOUNTER — Other Ambulatory Visit: Payer: Self-pay

## 2020-05-10 VITALS — BP 102/60 | HR 87 | Ht 67.0 in | Wt 231.1 lb

## 2020-05-10 DIAGNOSIS — R399 Unspecified symptoms and signs involving the genitourinary system: Secondary | ICD-10-CM | POA: Diagnosis not present

## 2020-05-10 DIAGNOSIS — J948 Other specified pleural conditions: Secondary | ICD-10-CM

## 2020-05-10 DIAGNOSIS — M67912 Unspecified disorder of synovium and tendon, left shoulder: Secondary | ICD-10-CM | POA: Diagnosis not present

## 2020-05-10 DIAGNOSIS — M75101 Unspecified rotator cuff tear or rupture of right shoulder, not specified as traumatic: Secondary | ICD-10-CM | POA: Insufficient documentation

## 2020-05-10 DIAGNOSIS — R911 Solitary pulmonary nodule: Secondary | ICD-10-CM | POA: Diagnosis not present

## 2020-05-10 DIAGNOSIS — S46011S Strain of muscle(s) and tendon(s) of the rotator cuff of right shoulder, sequela: Secondary | ICD-10-CM | POA: Diagnosis not present

## 2020-05-10 LAB — POCT URINALYSIS DIP (MANUAL ENTRY)
Bilirubin, UA: NEGATIVE
Glucose, UA: NEGATIVE mg/dL
Ketones, POC UA: NEGATIVE mg/dL
Leukocytes, UA: NEGATIVE
Nitrite, UA: NEGATIVE
Protein Ur, POC: NEGATIVE mg/dL
Spec Grav, UA: 1.02 (ref 1.010–1.025)
Urobilinogen, UA: 0.2 E.U./dL
pH, UA: 7 (ref 5.0–8.0)

## 2020-05-10 LAB — POCT UA - MICROSCOPIC ONLY

## 2020-05-10 NOTE — Assessment & Plan Note (Addendum)
Followed by workers comp. Surgery scheduled for next week. I advised her to bring in copies of her CT/MRI as well as note from her orthopedic so we can scan to her file. Continue current pain regimen.

## 2020-05-10 NOTE — Assessment & Plan Note (Signed)
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 F/U MRI recommended. I have ordered this again. I will escribe anxiolytic a day prior to her MRI.

## 2020-05-10 NOTE — Patient Instructions (Signed)
Lung Mass  A lung mass is a growth in the lung that is larger than 1.2 inches (3 cm). Smaller growths are called nodules. Most lung, or pulmonary, nodules are not cancer. Lung masses have a higher risk of being cancer than nodules do. A lung mass is sometimes found during a routine chest X-ray or while other imaging tests are done to check for other problems. Lung masses include:  Tumors. These may be cancerous (malignant) or noncancerous (benign).  Granulomas. These are infectious masses. They are caused by inflammation from bacterial infections, such as tuberculosis, or from fungal infections.  Noninfectious masses. Some diseases that cause lung inflammation may also cause lung masses to form.  Blood vessel malformations. What are the causes? This condition may be caused by:  A bacterial, fungal, or viral infection, such as tuberculosis. The infection is usually an old and inactive one.  Cancerous tissue, such as lung cancer or a cancer in another part of the body that has spread to the lung.  A noncancerous mass of tissue.  Inflammation from conditions such as rheumatoid arthritis.  Abnormal blood vessels in the lungs. What are the signs or symptoms? Usually, there are no symptoms of this condition. If symptoms appear, they are usually related to the underlying cause or due to pressure on surrounding tissue. For example, if the condition is caused by an infection, you may have a cough or a fever. How is this diagnosed? This condition may be diagnosed based on testing to find the cause of the mass. If a lung mass is found, you may have:  A physical exam.  Blood tests.  Imaging tests. These may include: ? Chest X-rays. ? CT scan. This test shows smaller pulmonary nodules more clearly and with more detail than an X-ray. ? Positron emission tomography (PET) scan. This test is done to check if a nodule or mass is cancerous. During the test, a small amount of a radioactive substance  is injected into the bloodstream. Then a picture is taken.  Biopsy to evaluate for cancer or confirm a diagnosis. This procedure involves removing a tissue sample from the mass by: ? Inserting a needle through the chest. ? Using a scope placed down into the lung. ? Doing open surgery. Tests and physical exams may be done once, or they may be done regularly for a period of time. Tests and exams that are done regularly will help monitor whether the mass is growing and becoming a concern. How is this treated? Treatment for a lung mass depends on the cause and whether the mass is cancerous. Treatment options for a cancerous lung mass may include:  Surgical removal.  Radiation therapy. This therapy uses high-energy X-rays to kill cancer cells.  Chemotherapy. This therapy uses medicines to slow down or stop the growth of cancer.  Targeted therapy, if the mass is cancerous. In this therapy, medicines are used that directly fight cancer cells and do not harm normal cells.  Immunotherapy, if the mass is cancerous. This therapy uses medicines that help your disease-fighting system (immune system) fight cancer cells. Noncancerous masses may require treatment specific to the cause. Follow these instructions at home: If you have had surgery or a biopsy, your health care provider will give you specific instructions for taking care of yourself at home after your procedure. Follow these instructions carefully. General home care instructions include:  Take over-the-counter and prescription medicines only as told by your health care provider.  Return to your normal activities as   told by your health care provider. Ask your health care provider what activities are safe for you.  Do not use any products that contain nicotine or tobacco. These products include cigarettes, chewing tobacco, and vaping devices, such as e-cigarettes. If you need help quitting, ask your health care provider.  Keep all follow-up  visits. This is important. Contact a health care provider if:  You have pain in your chest, back, or shoulder.  You are short of breath or have trouble breathing when you are active.  You develop a cough, or you develop hoarseness for an unexplained reason.  You feel sick or unusually tired.  You do not feel like eating, or you lose weight without trying.  You get chills or night sweats.  You need two or more pillows to sleep on at night.  You have any of these problems: ? A fever and symptoms that suddenly get worse. ? A fever or persistent symptoms for more than 2-3 days. Get help right away if:  You cannot catch your breath.  You have sudden chest pain.  You start making high-pitched whistling sounds when you breathe, most often when you breathe out (you wheeze).  You cannot stop coughing, or you cough up blood or bloody mucus from your lungs (sputum).  You become dizzy or feel like you may faint. These symptoms may represent a serious problem that is an emergency. Do not wait to see if the symptoms will go away. Get medical help right away. Call your local emergency services (911 in the [place] Do not drive yourself to the hospital. Summary  A lung mass is a growth in the lung that is larger than 1.2 inches (3 cm).  Lung masses have a higher risk of being cancer than do smaller growths.  Sometimes a lung mass is found during a routine chest X-ray or other imaging test.  Your health care provider may remove a tissue sample of the lung mass for testing (do a biopsy) to rule out or confirm cancer.  Treatment for this condition depends on the cause. This information is not intended to replace advice given to you by your health care provider. Make sure you discuss any questions you have with your health care provider. Document Revised: 07/26/2019 Document Reviewed: 07/26/2019 Elsevier Patient Education  2021 Elsevier Inc.  

## 2020-05-10 NOTE — Progress Notes (Signed)
    SUBJECTIVE:   CHIEF COMPLAINT / HPI:   UTI symptoms: C/O abnormal urine odor and dark urine when she does not drink enough water. No dysuria.  Shoulder pain: She is scheduled for rotator cuff repair surgery next Tuesday with orthopedic. She had and imaging done by her workers comp orthopedic (Dr. Tamera Punt) who is performing the surgery. No new concerns.   Lung Mass: She is still yet to get MRI done due to claustrophobia. She requested an appointment to get it done at [facility] with sedation.    PERTINENT  PMH / PSH: PMX reviewed  OBJECTIVE:   BP 102/60   Pulse 87   Ht 5\' 7"  (1.702 m)   Wt 231 lb 2 oz (104.8 kg)   SpO2 99%   BMI 36.20 kg/m    Physical Exam Vitals and nursing note reviewed.  Cardiovascular:     Rate and Rhythm: Normal rate and regular rhythm.     Heart sounds: Normal heart sounds. No murmur heard.   Pulmonary:     Effort: Pulmonary effort is normal. No respiratory distress.     Breath sounds: Normal breath sounds. No wheezing or rhonchi.  Abdominal:     General: Abdomen is flat. Bowel sounds are normal. There is no distension.     Palpations: There is no mass.     Tenderness: There is no abdominal tenderness.  Neurological:     Mental Status: She is alert.      ASSESSMENT/PLAN:  UTI symptoms: UA looks good with trace blood. RBC<3. Patient reassured this is likely due to dehydration which could also cause dark urine with strong odor. Keep well hydrated recommended. F/U soon if needed for reassessment. She agreed with the plan.  Right rotator cuff tear Followed by workers comp. Surgery scheduled for next week. I advised her to bring in copies of her CT/MRI as well as note from her orthopedic so we can scan to her file. Continue current pain regimen.  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 F/U MRI recommended. I have ordered this again. I will escribe anxiolytic a day prior to her MRI.     Barbara Mews, MD [place]

## 2020-05-12 NOTE — Progress Notes (Signed)
Patient ID: Barbara Clark, female   DOB: 1975-09-09, 45 y.o.   MRN: [id number]  I was contacted by the radiology tech, Mayra Renoza to switch order to MRI W WO constrast which I did. However, I was unable to delete the previous order of MRI W contrast.  I have messaged her to alert her of the two orders.

## 2020-05-12 NOTE — Addendum Note (Signed)
Addended by: Andrena Mews T on: 05/12/2020 02:50 PM   Modules accepted: Orders

## 2020-05-18 ENCOUNTER — Ambulatory Visit (HOSPITAL_COMMUNITY)
Admission: RE | Admit: 2020-05-18 | Discharge: 2020-05-18 | Disposition: A | Discharge status: Home / Self Care | Payer: Medicaid Other | Source: Ambulatory Visit | Attending: Family Medicine | Admitting: Family Medicine

## 2020-05-18 ENCOUNTER — Other Ambulatory Visit: Payer: Self-pay

## 2020-05-18 DIAGNOSIS — J948 Other specified pleural conditions: Secondary | ICD-10-CM

## 2020-05-18 MED ORDER — GADOBUTROL 1 MMOL/ML IV SOLN
10.0000 mL | Freq: Once | INTRAVENOUS | Status: AC | PRN
  Administered 2020-05-18: 10 mL via INTRAVENOUS

## 2020-05-20 ENCOUNTER — Other Ambulatory Visit: Payer: Self-pay | Admitting: Family Medicine

## 2020-05-20 DIAGNOSIS — J948 Other specified pleural conditions: Secondary | ICD-10-CM

## 2020-05-20 DIAGNOSIS — C499 Malignant neoplasm of connective and soft tissue, unspecified: Secondary | ICD-10-CM

## 2020-05-20 NOTE — Progress Notes (Signed)
Result discussed with her. Pulm consult recommended. She agreed with the plan.

## 2020-05-21 ENCOUNTER — Other Ambulatory Visit: Payer: Self-pay | Admitting: Family Medicine

## 2020-06-08 ENCOUNTER — Encounter: Payer: Self-pay | Admitting: Family Medicine
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
## 2020-06-11 ENCOUNTER — Ambulatory Visit (INDEPENDENT_AMBULATORY_CARE_PROVIDER_SITE_OTHER): Payer: Medicaid Other | Admitting: Family Medicine

## 2020-06-11 ENCOUNTER — Other Ambulatory Visit: Payer: Self-pay

## 2020-06-11 ENCOUNTER — Encounter: Payer: Self-pay | Admitting: Family Medicine

## 2020-06-11 DIAGNOSIS — L03116 Cellulitis of left lower limb: Secondary | ICD-10-CM | POA: Diagnosis not present

## 2020-06-11 DIAGNOSIS — L0390 Cellulitis, unspecified: Secondary | ICD-10-CM | POA: Insufficient documentation

## 2020-06-11 MED ORDER — DOXYCYCLINE HYCLATE 100 MG PO TABS: 100.0000 mg | ORAL_TABLET | Freq: Two times a day (BID) | ORAL | 0 refills | Status: DC

## 2020-06-11 NOTE — Progress Notes (Signed)
    SUBJECTIVE:   CHIEF COMPLAINT / HPI:   Cellulitis Ms. Fagerstrom reports that she was getting out of the shower yesterday when her daughter noticed that she had a bump on her leg.  On closer inspection, she did note that she seemed to have an area of redness with a small blister in the middle.  She took a picture and send it to her PCP yesterday because she recognize she is at high risk for infections due to her type 1 diabetes.  She was told to come into clinic to be assessed.  Overall, she feels well and does not remember any specific injury to her lower leg.  She does not know if this could be an insect bite or perhaps a scrape from brushing her leg against something several days ago.  This area on her leg does not bother her unless it is touched.  She does sometimes find it mildly itchy as well.  She was itching it earlier today when she seemed to broken the blister releasing some fluid.  Currently follows with endocrine for diabetes.  She is not sure what her last A1c was she believes it was around 10.  PERTINENT  PMH / PSH: Type 1 diabetes, obesity  OBJECTIVE:   BP 120/68   Pulse 93   Ht 5\' 7"  (1.702 m)   Wt 233 lb 8 oz (105.9 kg)   SpO2 97%   BMI 36.57 kg/m    General: Alert and cooperative and appears to be in no acute distress Respiratory: Breathing comfortably on room air.  No respiratory distress Extremities: No peripheral edema. Warm/ well perfused.  1.5-2 cm circular area of erythema with a central blister about 1 cm wide.  Mild clear drainage.  Mild tenderness with palpation around the blister for a diameter of about 8 cm. Neuro: Cranial nerves grossly intact  I did attempt to take a picture in clinic today although I could not get the epic app to function on my phone.  There is a photo available through the patient message on 5/21  ASSESSMENT/PLAN:   Cellulitis Physical exam is consistent with a cellulitis of the left lower extremity.  There was clearly blister  formation which is since ruptured.  There is some drainage which is not particularly purulent.  I would like her to have MRSA coverage.  I am concerned she is at high risk for a poorly controlled infection due to her type 1 diabetes.  For now, we will treat with 1 week of doxycycline.  She was told to return to clinic if she did not note significant improvement by the end of her antibiotic course. -Doxycycline 100 mg twice daily     Matilde Haymaker, MD [place]

## 2020-06-11 NOTE — Assessment & Plan Note (Signed)
Physical exam is consistent with a cellulitis of the left lower extremity.  There was clearly blister formation which is since ruptured.  There is some drainage which is not particularly purulent.  I would like her to have MRSA coverage.  I am concerned she is at high risk for a poorly controlled infection due to her type 1 diabetes.  For now, we will treat with 1 week of doxycycline.  She was told to return to clinic if she did not note significant improvement by the end of her antibiotic course. -Doxycycline 100 mg twice daily

## 2020-06-11 NOTE — Patient Instructions (Signed)
Cellulitis: This looks like you do have a minor skin infection.  At this time, do not think there is any concern that it is gotten out of control.  Lets start by treating it with 1 week of antibiotics.  If it does not show significant improvement by the time you are done with your antibiotics, I would like you to return to clinic to be assessed again.  As you know, it is possible for diabetics to have significant infections and I want a make sure this does not get out of control.
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
## 2020-06-13 ENCOUNTER — Other Ambulatory Visit: Payer: Self-pay

## 2020-06-13 ENCOUNTER — Ambulatory Visit (INDEPENDENT_AMBULATORY_CARE_PROVIDER_SITE_OTHER): Payer: Medicaid Other | Admitting: Internal Medicine

## 2020-06-13 ENCOUNTER — Encounter: Payer: Self-pay | Admitting: Internal Medicine

## 2020-06-13 VITALS — BP 122/74 | HR 99 | Ht 67.0 in | Wt 255.0 lb

## 2020-06-13 DIAGNOSIS — R0683 Snoring: Secondary | ICD-10-CM | POA: Diagnosis not present

## 2020-06-13 DIAGNOSIS — G4733 Obstructive sleep apnea (adult) (pediatric): Secondary | ICD-10-CM

## 2020-06-13 DIAGNOSIS — E669 Obesity, unspecified: Secondary | ICD-10-CM | POA: Diagnosis not present

## 2020-06-13 NOTE — Patient Instructions (Signed)
Order- schedule home sleep test dx OSA  Please call us about 2 weeks after your sleep test for results and recommendations If appropriate, we may be able to start treatment before I see you next.  You will need to make a separate Pulmonary Appointment with one of our doctors about your lung mass, as referred from you primary doctor.

## 2020-06-13 NOTE — Assessment & Plan Note (Signed)
Hx and exam favor OSA. Appropriate discussion done, including responsibility for safe driving, medical concerns, testing, treatment. Plan- sleep study then likely CPAP if appropriate.

## 2020-06-13 NOTE — Assessment & Plan Note (Signed)
Relationship of obesity to airway/ OSA discussed.  Plan- encourage ongoing efforts at diet/ exercise.

## 2020-06-26 ENCOUNTER — Other Ambulatory Visit: Payer: Self-pay | Admitting: Family Medicine

## 2020-06-26 ENCOUNTER — Other Ambulatory Visit: Payer: Self-pay

## 2020-06-26 ENCOUNTER — Encounter: Payer: Self-pay | Admitting: Pulmonary Disease

## 2020-06-26 ENCOUNTER — Ambulatory Visit (INDEPENDENT_AMBULATORY_CARE_PROVIDER_SITE_OTHER): Payer: Medicaid Other | Admitting: Pulmonary Disease

## 2020-06-26 VITALS — BP 118/84 | HR 112 | Temp 98.0°F | Ht 67.0 in | Wt 228.0 lb

## 2020-06-26 DIAGNOSIS — Z87891 Personal history of nicotine dependence: Secondary | ICD-10-CM

## 2020-06-26 DIAGNOSIS — R9389 Abnormal findings on diagnostic imaging of other specified body structures: Secondary | ICD-10-CM

## 2020-06-26 DIAGNOSIS — J948 Other specified pleural conditions: Secondary | ICD-10-CM | POA: Diagnosis not present

## 2020-06-26 NOTE — Patient Instructions (Addendum)
Thank you for visiting Dr. Valeta Harms at [facility]. Today we recommend the following:  Orders Placed This Encounter  Procedures  . NM PET Image Initial (PI) Skull Base To Thigh   We we will review your case at thoracic oncology conference. Pending recommendations likely set you up for consultation with thoracic surgery.    Please do your part to reduce the spread of COVID-19.

## 2020-06-26 NOTE — Progress Notes (Signed)
Synopsis: Referred in June 2022 for pleural based mass PCP: by [name], MD  Subjective:   PATIENT ID: Barbara Clark GENDER: female DOB: [birth year], MRN: [id number]  Chief Complaint  Patient presents with  . Consult    SOB with exertion    This is a 45 year old female, history of diabetes, hypertension, obesity.  Patient recently had right shoulder surgery is out on Workmen's Comp.  Last year developed cough had chest imaging which revealed a pleural-based lesion.  Had CT scan imaging of the chest completed.  Subsequently had MRI of the chest that was completed in May 2022.  The MRI of the chest reveals a 4 x 2 cm chest wall mass.  It is a lobular fatty lesion that extends from the right pleural space through the intercostal space.  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  I reviewed patient's MRI imaging and CT imaging with them today in the office.  From a respiratory standpoint patient has no complaints.   Past Medical History:  Diagnosis Date  . Abnormality of gait 12/26/2012  . Anxiety   . Chronic leg pain 04/27/2014  . Depression   . Diabetes mellitus    Type I  . DKA (diabetic ketoacidosis) ([place]) 09/03/2017  . HSV-2 (herpes simplex virus 2) infection 2012   serology  . Hypertension   . Limb weakness 08/04/2013  . Menorrhagia 04/27/2014  . Right leg weakness 02/20/2014  . Stuttering 02/02/2013     Family History  Problem Relation Age of Onset  . Diabetes Mother   . Hypertension Mother   . Diabetes Maternal Grandmother      Past Surgical History:  Procedure Laterality Date  . TUBAL LIGATION      Social History   Socioeconomic History  . Marital status: Single    Spouse name: Not on file  . Number of children: Not on file  . Years of education: Not on file  . Highest education level: Not on file  Occupational History  . Not on file  Tobacco Use  . Smoking status: Former Smoker    Packs/day: 1.00    Years: 12.00    Pack years: 12.00     Types: Cigarettes    Quit date: 07/14/2017    Years since quitting: 2.9  . Smokeless tobacco: Never Used  . Tobacco comment: trying to quit  Substance and Sexual Activity  . Alcohol use: Yes    Alcohol/week: 0.0 standard drinks    Comment: rare but on occasion  . Drug use: No  . Sexual activity: Yes    Birth control/protection: Surgical, I.U.D.  Other Topics Concern  . Not on file  Social History Narrative   Pt lives with her two children. Has some college.    Social Determinants of Health   Financial Resource Strain: Not on file  Food Insecurity: Food Insecurity Present  . Worried About Charity fundraiser in the Last Year: Often true  . Ran Out of Food in the Last Year: Often true  Transportation Needs: No Transportation Needs  . Lack of Transportation (Medical): No  . Lack of Transportation (Non-Medical): No  Physical Activity: Not on file  Stress: Not on file  Social Connections: Not on file  Intimate Partner Violence: Not on file     Allergies  Allergen Reactions  . Diclofenac Other (See Comments)    Constipation and stomach upset  . Meloxicam Other (See Comments)    Constipation and stomach upset  Outpatient Medications Prior to Visit  Medication Sig Dispense Refill  . ACCU-CHEK GUIDE test strip     . atorvastatin (LIPITOR) 10 MG tablet TAKE 1 TABLET BY MOUTH EVERYDAY AT BEDTIME 90 tablet 2  . Biotin 1 MG CAPS Take 1 tablet by mouth daily.    Marland Kitchen doxycycline (VIBRA-TABS) 100 MG tablet Take 1 tablet (100 mg total) by mouth 2 (two) times daily. 14 tablet 0  . insulin aspart (NOVOLOG) 100 UNIT/ML injection USE VIA INSULIN PUMP (MAX 100 UNITS DAILY) 30 DAYS SUBCUTANEOUS 30 DAYS    . Insulin Human (INSULIN PUMP) SOLN Inject into the skin. Patient uses novolog 1.6 units/hr in insulin pump.    Marland Kitchen losartan (COZAAR) 25 MG tablet TAKE 1 TABLET BY MOUTH EVERYDAY AT BEDTIME 90 tablet 1  . meloxicam (MOBIC) 7.5 MG tablet Take 7.5 mg by mouth daily.    . Multiple Vitamin  (MULTIVITAMIN) capsule Take 1 capsule by mouth daily.    . vitamin B-12 (CYANOCOBALAMIN) 100 MCG tablet Take 100 mcg by mouth daily.     No facility-administered medications prior to visit.    ROS   Objective:  Physical Exam Vitals reviewed.  Constitutional:      General: She is not in acute distress.    Appearance: She is well-developed. She is obese.  HENT:     Head: Normocephalic and atraumatic.  Eyes:     General: No scleral icterus.    Conjunctiva/sclera: Conjunctivae normal.     Pupils: Pupils are equal, round, and reactive to light.  Neck:     Vascular: No JVD.     Trachea: No tracheal deviation.  Cardiovascular:     Rate and Rhythm: Normal rate and regular rhythm.     Heart sounds: Normal heart sounds. No murmur heard.   Pulmonary:     Effort: Pulmonary effort is normal. No tachypnea, accessory muscle usage or respiratory distress.     Breath sounds: No stridor. No wheezing, rhonchi or rales.  Abdominal:     General: Bowel sounds are normal. There is no distension.     Palpations: Abdomen is soft.     Tenderness: There is no abdominal tenderness.  Musculoskeletal:        General: No tenderness.     Cervical back: Neck supple.  Lymphadenopathy:     Cervical: No cervical adenopathy.  Skin:    General: Skin is warm and dry.     Capillary Refill: Capillary refill takes less than 2 seconds.     Findings: No rash.  Neurological:     Mental Status: She is alert and oriented to person, place, and time.  Psychiatric:        Behavior: Behavior normal.      Vitals:   06/26/20 0930  BP: 118/84  Pulse: (!) 112  Temp: 98 F (36.7 C)  SpO2: 97%  Weight: 228 lb (103.4 kg)  Height: 5\' 7"  (1.702 m)   97% on RA BMI Readings from Last 3 Encounters:  06/26/20 35.71 kg/m  06/13/20 39.94 kg/m  06/11/20 36.57 kg/m   Wt Readings from Last 3 Encounters:  06/26/20 228 lb (103.4 kg)  06/13/20 255 lb (115.7 kg)  06/11/20 233 lb 8 oz (105.9 kg)     CBC     Component Value Date/Time   WBC 8.1 01/11/2020 1057   WBC 10.8 (H) 09/03/2017 1118   RBC 4.23 01/11/2020 1057   RBC 4.21 09/03/2017 1118   HGB 12.7 01/11/2020 1057   HCT 37.8  01/11/2020 1057   PLT 478 (H) 01/11/2020 1057   MCV 89 01/11/2020 1057   MCH 30.0 01/11/2020 1057   MCH 30.4 09/03/2017 1118   MCHC 33.6 01/11/2020 1057   MCHC 31.6 09/03/2017 1118   RDW 11.9 01/11/2020 1057   LYMPHSABS 2.8 11/02/2014 1412   MONOABS 0.5 11/02/2014 1412   EOSABS 0.1 11/02/2014 1412   BASOSABS 0.0 11/02/2014 1412    Chest Imaging: CT Of the chest May 2022 4 cm right chest wall lobulated fatty lesion 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 The patient's images have been independently reviewed by me.    Pulmonary Functions Testing Results: No flowsheet data found.  FeNO:   Pathology:   Echocardiogram:   Heart Catheterization:     Assessment & Plan:     ICD-10-CM   1. Pleural mass  J94.8 NM PET Image Initial (PI) Skull Base To Thigh  2. Former smoker  Z87.891   3. Abnormal magnetic resonance imaging of chest  R93.89     Discussion:  This is a 45 year old recent abnormal MRI imaging of the chest.  Pleural-based mass lobulated 4 cm in size.  We reviewed images today in the office.  She is a former smoker quit in 2019.  No other significant family history of malignancy.  Plan: I think we need to review her images and discuss neck steps at medical thoracic oncology conference. I have ordered a nuclear medicine PET scan. Pending PET scan I think we can make next best steps on consideration for tissue biopsy. Either considerations for surgical biopsy versus interventional radiology. Patient is agreeable to this plan.    Current Outpatient Medications:  .  ACCU-CHEK GUIDE test strip, , Disp: , Rfl:  .  atorvastatin (LIPITOR) 10 MG tablet, TAKE 1 TABLET BY MOUTH EVERYDAY AT BEDTIME, Disp: 90 tablet, Rfl: 2 .  Biotin 1 MG CAPS, Take 1 tablet by mouth daily., Disp: , Rfl:  .   doxycycline (VIBRA-TABS) 100 MG tablet, Take 1 tablet (100 mg total) by mouth 2 (two) times daily., Disp: 14 tablet, Rfl: 0 .  insulin aspart (NOVOLOG) 100 UNIT/ML injection, USE VIA INSULIN PUMP (MAX 100 UNITS DAILY) 30 DAYS SUBCUTANEOUS 30 DAYS, Disp: , Rfl:  .  Insulin Human (INSULIN PUMP) SOLN, Inject into the skin. Patient uses novolog 1.6 units/hr in insulin pump., Disp: , Rfl:  .  losartan (COZAAR) 25 MG tablet, TAKE 1 TABLET BY MOUTH EVERYDAY AT BEDTIME, Disp: 90 tablet, Rfl: 1 .  meloxicam (MOBIC) 7.5 MG tablet, Take 7.5 mg by mouth daily., Disp: , Rfl:  .  Multiple Vitamin (MULTIVITAMIN) capsule, Take 1 capsule by mouth daily., Disp: , Rfl:  .  vitamin B-12 (CYANOCOBALAMIN) 100 MCG tablet, Take 100 mcg by mouth daily., Disp: , Rfl:     Garner Nash, DO [facility] 06/26/2020 9:59 AM

## 2020-06-27 ENCOUNTER — Telehealth: Payer: Self-pay | Admitting: Pulmonary Disease

## 2020-06-27 DIAGNOSIS — J948 Other specified pleural conditions: Secondary | ICD-10-CM

## 2020-06-27 NOTE — Telephone Encounter (Signed)
PCCM:  Referral placed to [facility].  Case discussed with Dr. Roxan Hockey.   Please schedule after PET scan has been completed.   Thanks  Garner Nash, DO [facility] 06/27/2020 9:35 AM

## 2020-07-03 ENCOUNTER — Ambulatory Visit (HOSPITAL_COMMUNITY)
Admission: RE | Admit: 2020-07-03 | Discharge: 2020-07-03 | Disposition: A | Discharge status: Home / Self Care | Payer: Medicaid Other | Source: Ambulatory Visit | Attending: Pulmonary Disease | Admitting: Pulmonary Disease

## 2020-07-03 ENCOUNTER — Other Ambulatory Visit: Payer: Self-pay

## 2020-07-03 DIAGNOSIS — J948 Other specified pleural conditions: Secondary | ICD-10-CM | POA: Diagnosis not present

## 2020-07-03 LAB — GLUCOSE, CAPILLARY: Glucose-Capillary: 120 mg/dL — ABNORMAL HIGH (ref 70–99)

## 2020-07-03 MED ORDER — FLUDEOXYGLUCOSE F - 18 (FDG) INJECTION
11.4000 | Freq: Once | INTRAVENOUS | Status: AC
  Administered 2020-07-03: 11.34 via INTRAVENOUS

## 2020-07-08 ENCOUNTER — Other Ambulatory Visit: Payer: Self-pay

## 2020-07-08 ENCOUNTER — Institutional Professional Consult (permissible substitution): Payer: Medicaid Other | Admitting: Thoracic Surgery (Cardiothoracic Vascular Surgery)

## 2020-07-08 VITALS — BP 136/87 | HR 104 | Resp 20 | Ht 67.0 in | Wt 232.0 lb

## 2020-07-08 DIAGNOSIS — J948 Other specified pleural conditions: Secondary | ICD-10-CM | POA: Diagnosis not present
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
## 2020-07-09 ENCOUNTER — Encounter: Payer: Self-pay | Admitting: *Deleted

## 2020-07-09 ENCOUNTER — Other Ambulatory Visit: Payer: Self-pay | Admitting: *Deleted

## 2020-07-09 DIAGNOSIS — R222 Localized swelling, mass and lump, trunk: Secondary | ICD-10-CM

## 2020-07-15 ENCOUNTER — Ambulatory Visit (INDEPENDENT_AMBULATORY_CARE_PROVIDER_SITE_OTHER): Payer: Medicaid Other | Admitting: Family Medicine

## 2020-07-15 ENCOUNTER — Other Ambulatory Visit: Payer: Self-pay

## 2020-07-15 DIAGNOSIS — E1040 Type 1 diabetes mellitus with diabetic neuropathy, unspecified: Secondary | ICD-10-CM | POA: Diagnosis not present

## 2020-07-15 DIAGNOSIS — R350 Frequency of micturition: Secondary | ICD-10-CM | POA: Diagnosis not present

## 2020-07-15 LAB — POCT URINALYSIS DIP (MANUAL ENTRY)
Bilirubin, UA: NEGATIVE
Blood, UA: NEGATIVE
Glucose, UA: NEGATIVE mg/dL
Ketones, POC UA: NEGATIVE mg/dL
Leukocytes, UA: NEGATIVE
Nitrite, UA: NEGATIVE
Protein Ur, POC: NEGATIVE mg/dL
Spec Grav, UA: 1.025 (ref 1.010–1.025)
Urobilinogen, UA: 4 E.U./dL — AB
pH, UA: 7 (ref 5.0–8.0)

## 2020-07-15 MED ORDER — METRONIDAZOLE 500 MG PO TABS
500.0000 mg | ORAL_TABLET | Freq: Two times a day (BID) | ORAL | 0 refills | Status: DC
Start: 2020-07-15 — End: 2020-09-19

## 2020-07-15 NOTE — Assessment & Plan Note (Signed)
Patient followed by Endocrinology for her diabetes. Most recent A1c 9%.

## 2020-07-15 NOTE — Progress Notes (Signed)
    SUBJECTIVE:   CHIEF COMPLAINT / HPI:   Urinary frequency/fishy odor: Pleasant patient presents to clinic today with concerns for urinary frequency.  Patient has a history of type 1 diabetes, she follows with endocrinology, states her most recent A1c was 9%.  No concerns at this time for urinary urgency or lower abdominal pain, no pain with urination.  She reports she has a a lot of difficulty staying hydrated.  She will have clear/light yellow urine when she urinates but then shortly after if she does not hydrate a lot then her urine will be very dark.  She also reports having a fishy odor from the vaginal area, she feels embarrassed by this.  This upcoming weekend she is doing a girls trip and wants to fix this problem before then.  She denies any vaginal itch or vaginal discharge.  No concern today for sexually transmitted infections.  No pelvic pain.  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 - vitals were taken and noted to be wnl, however were not recorded  Physical exam: General: well appearing patient, very pleasant Respiratory: comfortable work of breathing on room air   ASSESSMENT/PLAN:   Diabetes mellitus type  I [facility]) Patient followed by Endocrinology for her diabetes. Most recent A1c 9%.   Urinary frequency Urinary frequency could be due to uncontrolled diabetes. Patient has been hydrating more, either due to dehydration vs hot weather.  UA normal at this time.  -Flagyl for "fishy vaginal odor" -Strict return precautions provided     [place]

## 2020-07-15 NOTE — Patient Instructions (Addendum)
Thank you for coming in to see [place] today! Please see below to review our plan for today's visit:  1.  Your urinalysis is totally normal, there is no evidence of infection at this time.  I have placed an order for metronidazole 500mg  twice daily for the next 7 days.  My suspicion is you have bacterial vaginosis again that is calling the fishy odor.  Take this medication as prescribed until the medication is all gone. 2.  Should you develop worsening symptoms, or your symptoms not improve, please come back to see [place] as we may want to consider doing a more thorough work-up.   Please call the clinic at [phone] if your symptoms worsen or you have any concerns. It was our pleasure to serve you!   Dr. Milus Banister [facility]

## 2020-07-15 NOTE — Progress Notes (Signed)
URI.

## 2020-07-15 NOTE — Assessment & Plan Note (Signed)
Urinary frequency could be due to uncontrolled diabetes. Patient has been hydrating more, either due to dehydration vs hot weather.  UA normal at this time.  -Flagyl for "fishy vaginal odor" -Strict return precautions provided

## 2020-09-04 ENCOUNTER — Encounter: Payer: Self-pay | Admitting: Family Medicine

## 2020-09-06 ENCOUNTER — Ambulatory Visit (INDEPENDENT_AMBULATORY_CARE_PROVIDER_SITE_OTHER): Payer: Medicaid Other

## 2020-09-06 ENCOUNTER — Other Ambulatory Visit: Payer: Self-pay

## 2020-09-06 DIAGNOSIS — Z23 Encounter for immunization: Secondary | ICD-10-CM

## 2020-09-16 ENCOUNTER — Other Ambulatory Visit (HOSPITAL_COMMUNITY)
Admission: RE | Admit: 2020-09-16 | Discharge: 2020-09-16 | Disposition: A | Discharge status: Home / Self Care | Payer: Medicaid Other | Source: Ambulatory Visit | Attending: Family Medicine | Admitting: Family Medicine

## 2020-09-16 ENCOUNTER — Other Ambulatory Visit: Payer: Self-pay

## 2020-09-16 ENCOUNTER — Ambulatory Visit (INDEPENDENT_AMBULATORY_CARE_PROVIDER_SITE_OTHER): Payer: Medicaid Other | Admitting: Family Medicine

## 2020-09-16 ENCOUNTER — Encounter: Payer: Self-pay | Admitting: Family Medicine

## 2020-09-16 VITALS — BP 108/68 | HR 84 | Ht 67.0 in | Wt 231.0 lb

## 2020-09-16 DIAGNOSIS — N760 Acute vaginitis: Secondary | ICD-10-CM

## 2020-09-16 DIAGNOSIS — N898 Other specified noninflammatory disorders of vagina: Secondary | ICD-10-CM | POA: Insufficient documentation

## 2020-09-16 NOTE — Patient Instructions (Signed)
I will call with the lab test results.   It was very nice meeting you.

## 2020-09-18 ENCOUNTER — Encounter: Payer: Self-pay | Admitting: Family Medicine

## 2020-09-18 NOTE — Progress Notes (Signed)
    SUBJECTIVE:   CHIEF COMPLAINT / HPI:   C/O vag discharge.  No pain.  No bleeding.  States that she is at low risk for STD.  Pap is up to date.     OBJECTIVE:   BP 108/68   Pulse 84   Ht '5\' 7"'$  (1.702 m)   Wt 231 lb (104.8 kg)   SpO2 96%   BMI 36.18 kg/m   Abd benign Pelvic.  Vag DC present.  No vaginal inflammation. Bimanual, no uterine of ovarian tenderness.  ASSESSMENT/PLAN:   Vaginitis Await test results before treating.     Zenia Resides, MD [place]

## 2020-09-18 NOTE — Assessment & Plan Note (Addendum)
Await test results before treating. Lab back (finally) shows BV.  Will treat.  Called patient.

## 2020-09-19 LAB — CERVICOVAGINAL ANCILLARY ONLY
Bacterial Vaginitis (gardnerella): POSITIVE — AB
Candida Glabrata: NEGATIVE
Candida Vaginitis: NEGATIVE
Chlamydia: NEGATIVE
Comment: NEGATIVE
Comment: NEGATIVE
Comment: NEGATIVE
Comment: NEGATIVE
Comment: NEGATIVE
Comment: NORMAL
Neisseria Gonorrhea: NEGATIVE
Trichomonas: NEGATIVE

## 2020-09-19 MED ORDER — METRONIDAZOLE 500 MG PO TABS: 500.0000 mg | ORAL_TABLET | Freq: Two times a day (BID) | ORAL | 0 refills | Status: AC

## 2020-09-19 NOTE — Addendum Note (Signed)
Addended by: Zenia Resides on: 09/19/2020 02:19 PM   Modules accepted: Orders

## 2020-09-20 NOTE — Pre-Procedure Instructions (Signed)
Surgical Instructions    Your procedure is scheduled on Thursday, September 8th.  Report to [facility] Main Entrance "A" at 6:00 A.M., then check in with the Admitting office.  Call this number if you have problems the morning of surgery:  [phone]   If you have any questions prior to your surgery date call [phone]: Open Monday-Friday 8am-4pm    Remember:  Do not eat or drink after midnight the night before your surgery    Take these medicines the morning of surgery with A SIP OF WATER  metroNIDAZOLE (FLAGYL)-if you are still taking this.  As of today, STOP taking any Aspirin (unless otherwise instructed by your surgeon) Aleve, Naproxen, Ibuprofen, Motrin, Advil, Goody's, BC's, all herbal medications, fish oil, and all vitamins.  WHAT DO I DO ABOUT MY DIABETES MEDICATION?  Patients with Insulin Pumps    For patients with Insulin Pumps: Contact your diabetes doctor for specific instructions before surgery. Decrease basal insulin rates by 20% at midnight the night before surgery. Do not remove your insulin pump prior to arrival to short stay the morning of surgery.  Anesthesia will instruct you on when to remove your pump. Note that if your surgery is planned to be longer than 2 hours, your insulin pump will be removed and intravenous (IV) insulin will be started and managed by the nurses and anesthesiologist. You will be able to restart your insulin pump once you are awake and able to manage it. Make sure to bring insulin pump supplies to the hospital with you in case your site needs to be changed.   HOW TO MANAGE YOUR DIABETES BEFORE AND AFTER SURGERY  Why is it important to control my blood sugar before and after surgery? Improving blood sugar levels before and after surgery helps healing and can limit problems. A way of improving blood sugar control is eating a healthy diet by:  Eating less sugar and carbohydrates  Increasing activity/exercise  Talking with your  doctor about reaching your blood sugar goals High blood sugars (greater than 180 mg/dL) can raise your risk of infections and slow your recovery, so you will need to focus on controlling your diabetes during the weeks before surgery. Make sure that the doctor who takes care of your diabetes knows about your planned surgery including the date and location.  How do I manage my blood sugar before surgery? Check your blood sugar at least 4 times a day, starting 2 days before surgery, to make sure that the level is not too high or low.  Check your blood sugar the morning of your surgery when you wake up and every 2 hours until you get to the Short Stay unit.  If your blood sugar is less than 70 mg/dL, you will need to treat for low blood sugar: Do not take insulin. Treat a low blood sugar (less than 70 mg/dL) with  cup of clear juice (cranberry or apple), 4 glucose tablets, OR glucose gel. Recheck blood sugar in 15 minutes after treatment (to make sure it is greater than 70 mg/dL). If your blood sugar is not greater than 70 mg/dL on recheck, call [phone] for further instructions. Report your blood sugar to the short stay nurse when you get to Short Stay.  If you are admitted to the hospital after surgery: Your blood sugar will be checked by the staff and you will probably be given insulin after surgery (instead of oral diabetes medicines) to make sure you have good blood sugar levels. The  goal for blood sugar control after surgery is 80-180 mg/dL.                      Do NOT Smoke (Tobacco/Vaping) or drink Alcohol 24 hours prior to your procedure.  If you use a CPAP at night, you may bring all equipment for your overnight stay.   Contacts, glasses, piercing's, hearing aid's, dentures or partials may not be worn into surgery, please bring cases for these belongings.    For patients admitted to the hospital, discharge time will be determined by your treatment team.   Patients discharged  the day of surgery will not be allowed to drive home, and someone needs to stay with them for 24 hours.  ONLY 1 SUPPORT PERSON MAY BE PRESENT WHILE YOU ARE IN SURGERY. IF YOU ARE TO BE ADMITTED ONCE YOU ARE IN YOUR ROOM YOU WILL BE ALLOWED TWO (2) VISITORS.  Minor children may have two parents present. Special consideration for safety and communication needs will be reviewed on a case by case basis.   Special instructions:   [facility]- Preparing For Surgery  Before surgery, you can play an important role. Because skin is not sterile, your skin needs to be as free of germs as possible. You can reduce the number of germs on your skin by washing with CHG (chlorahexidine gluconate) Soap before surgery.  CHG is an antiseptic cleaner which kills germs and bonds with the skin to continue killing germs even after washing.    Oral Hygiene is also important to reduce your risk of infection.  Remember - BRUSH YOUR TEETH THE MORNING OF SURGERY WITH YOUR REGULAR TOOTHPASTE  Please do not use if you have an allergy to CHG or antibacterial soaps. If your skin becomes reddened/irritated stop using the CHG.  Do not shave (including legs and underarms) for at least 48 hours prior to first CHG shower. It is OK to shave your face.  Please follow these instructions carefully.   Shower the NIGHT BEFORE SURGERY and the MORNING OF SURGERY  If you chose to wash your hair, wash your hair first as usual with your normal shampoo.  After you shampoo, rinse your hair and body thoroughly to remove the shampoo.  Use CHG Soap as you would any other liquid soap. You can apply CHG directly to the skin and wash gently with a scrungie or a clean washcloth.   Apply the CHG Soap to your body ONLY FROM THE NECK DOWN.  Do not use on open wounds or open sores. Avoid contact with your eyes, ears, mouth and genitals (private parts). Wash Face and genitals (private parts)  with your normal soap.   Wash thoroughly, paying special  attention to the area where your surgery will be performed.  Thoroughly rinse your body with warm water from the neck down.  DO NOT shower/wash with your normal soap after using and rinsing off the CHG Soap.  Pat yourself dry with a CLEAN TOWEL.  Wear CLEAN PAJAMAS to bed the night before surgery  Place CLEAN SHEETS on your bed the night before your surgery  DO NOT SLEEP WITH PETS.   Day of Surgery: Shower with CHG soap. Do not wear jewelry, make up, nail polish, gel polish, artificial nails, or any other type of covering on natural nails including finger and toenails. If patients have artificial nails, gel coating, etc. that need to be removed by a nail salon please have this removed prior to surgery.  Surgery may need to be canceled/delayed if the surgeon/ anesthesia feels like the patient is unable to be adequately monitored. Do not wear lotions, powders, perfumes, or deodorant. Do not shave 48 hours prior to surgery.  Do not bring valuables to the hospital. [facility] is not responsible for any belongings or valuables. Wear Clean/Comfortable clothing the morning of surgery Remember to brush your teeth WITH YOUR REGULAR TOOTHPASTE.   Please read over the following fact sheets that you were given.

## 2020-09-24 ENCOUNTER — Encounter: Payer: Self-pay | Admitting: *Deleted

## 2020-09-24 ENCOUNTER — Inpatient Hospital Stay (HOSPITAL_COMMUNITY)
Admission: RE | Admit: 2020-09-24 | Discharge: 2020-09-24 | Disposition: A | Discharge status: Home / Self Care | Payer: Medicaid Other | Source: Ambulatory Visit

## 2020-09-24 NOTE — Progress Notes (Signed)
Patient tested positive for covid with [facility].  Per Levonne Spiller, patient will bring results with her to PAT appointment.

## 2020-10-01 NOTE — Progress Notes (Signed)
Surgical Instructions    Your procedure is scheduled on Friday, September 16th, 2022.   Report to [facility] Main Entrance "A" at 10:00 A.M., then check in with the Admitting office.  Call this number if you have problems the morning of surgery:  [phone]   If you have any questions prior to your surgery date call [phone]: Open Monday-Friday 8am-4pm    Remember:  Do not eat or drink after midnight the night before your surgery    Take these medicines the morning of surgery with A SIP OF WATER - none   As of today, STOP taking any Aspirin (unless otherwise instructed by your surgeon) Aleve, Naproxen, Ibuprofen, Motrin, Advil, Goody's, BC's, all herbal medications, fish oil, and all vitamins.           Do not wear jewelry or makeup Do not wear lotions, powders, perfumes, or deodorant. Do not shave 48 hours prior to surgery.   Do not bring valuables to the hospital. DO Not wear nail polish, gel polish, artificial nails, or any other type of covering on natural nails including finger and toenails. If patients have artificial nails, gel coating, etc. that need to be removed by a nail salon please have this removed prior to surgery or surgery may need to be canceled/delayed if the surgeon/ anesthesia feels like the patient is unable to be adequately monitored.   WHAT DO I DO ABOUT MY DIABETES MEDICATION?  Patients with Insulin Pumps   For patients with Insulin Pumps: Contact your diabetes doctor for specific instructions before surgery. Decrease basal insulin rates by 20% at midnight the night before surgery. Do not remove your insulin pump prior to arrival to short stay the morning of surgery.  Anesthesia will instruct you on when to remove your pump. Note that if your surgery is planned to be longer than 2 hours, your insulin pump will be removed and intravenous (IV) insulin will be started and managed by the nurses and anesthesiologist. You will be able to restart your  insulin pump once you are awake and able to manage it. Make sure to bring insulin pump supplies to the hospital with you in case your site needs to be changed.    HOW TO MANAGE YOUR DIABETES BEFORE AND AFTER SURGERY  Why is it important to control my blood sugar before and after surgery? Improving blood sugar levels before and after surgery helps healing and can limit problems. A way of improving blood sugar control is eating a healthy diet by:  Eating less sugar and carbohydrates  Increasing activity/exercise  Talking with your doctor about reaching your blood sugar goals High blood sugars (greater than 180 mg/dL) can raise your risk of infections and slow your recovery, so you will need to focus on controlling your diabetes during the weeks before surgery. Make sure that the doctor who takes care of your diabetes knows about your planned surgery including the date and location.  How do I manage my blood sugar before surgery? Check your blood sugar at least 4 times a day, starting 2 days before surgery, to make sure that the level is not too high or low.  Check your blood sugar the morning of your surgery when you wake up and every 2 hours until you get to the Short Stay unit.  If your blood sugar is less than 70 mg/dL, you will need to treat for low blood sugar: Do not take insulin. Treat a low blood sugar (less than 70 mg/dL) with  cup of clear juice (cranberry or apple), 4 glucose tablets, OR glucose gel. Recheck blood sugar in 15 minutes after treatment (to make sure it is greater than 70 mg/dL). If your blood sugar is not greater than 70 mg/dL on recheck, call [phone] for further instructions. Report your blood sugar to the short stay nurse when you get to Short Stay.  If you are admitted to the hospital after surgery: Your blood sugar will be checked by the staff and you will probably be given insulin after surgery (instead of oral diabetes medicines) to make sure you have  good blood sugar levels. The goal for blood sugar control after surgery is 80-180 mg/dL.             [facility] is not responsible for any belongings or valuables.  Do NOT Smoke (Tobacco/Vaping)  24 hours prior to your procedure If you use a CPAP at night, you may bring your mask for your overnight stay.   Contacts, glasses, dentures or bridgework may not be worn into surgery, please bring cases for these belongings   For patients admitted to the hospital, discharge time will be determined by your treatment team.   Patients discharged the day of surgery will not be allowed to drive home, and someone needs to stay with them for 24 hours.  ONLY 1 SUPPORT PERSON MAY BE PRESENT WHILE YOU ARE IN SURGERY. IF YOU ARE TO BE ADMITTED ONCE YOU ARE IN YOUR ROOM YOU WILL BE ALLOWED TWO (2) VISITORS.  Minor children may have two parents present. Special consideration for safety and communication needs will be reviewed on a case by case basis.  Special instructions:    Oral Hygiene is also important to reduce your risk of infection.  Remember - BRUSH YOUR TEETH THE MORNING OF SURGERY WITH YOUR REGULAR TOOTHPASTE   [facility]- Preparing For Surgery  Before surgery, you can play an important role. Because skin is not sterile, your skin needs to be as free of germs as possible. You can reduce the number of germs on your skin by washing with CHG (chlorahexidine gluconate) Soap before surgery.  CHG is an antiseptic cleaner which kills germs and bonds with the skin to continue killing germs even after washing.     Please do not use if you have an allergy to CHG or antibacterial soaps. If your skin becomes reddened/irritated stop using the CHG.  Do not shave (including legs and underarms) for at least 48 hours prior to first CHG shower. It is OK to shave your face.  Please follow these instructions carefully.     Shower the NIGHT BEFORE SURGERY and the MORNING OF SURGERY with CHG Soap.   If you chose to  wash your hair, wash your hair first as usual with your normal shampoo. After you shampoo, rinse your hair and body thoroughly to remove the shampoo.  Then [employer] and genitals (private parts) with your normal soap and rinse thoroughly to remove soap.  After that Use CHG Soap as you would any other liquid soap. You can apply CHG directly to the skin and wash gently with a scrungie or a clean washcloth.   Apply the CHG Soap to your body ONLY FROM THE NECK DOWN.  Do not use on open wounds or open sores. Avoid contact with your eyes, ears, mouth and genitals (private parts). Wash Face and genitals (private parts)  with your normal soap.   Wash thoroughly, paying special attention to the area where  your surgery will be performed.  Thoroughly rinse your body with warm water from the neck down.  DO NOT shower/wash with your normal soap after using and rinsing off the CHG Soap.  Pat yourself dry with a CLEAN TOWEL.  Wear CLEAN PAJAMAS to bed the night before surgery  Place CLEAN SHEETS on your bed the night before your surgery  DO NOT SLEEP WITH PETS.   Day of Surgery:  Take a shower with CHG soap. Wear Clean/Comfortable clothing the morning of surgery Do not apply any deodorants/lotions.   Remember to brush your teeth WITH YOUR REGULAR TOOTHPASTE.   Please read over the following fact sheets that you were given.

## 2020-10-02 ENCOUNTER — Encounter (HOSPITAL_COMMUNITY): Payer: Self-pay

## 2020-10-02 ENCOUNTER — Encounter (HOSPITAL_COMMUNITY): Payer: Self-pay | Admitting: Physician Assistant

## 2020-10-02 ENCOUNTER — Encounter (HOSPITAL_COMMUNITY)
Admission: RE | Admit: 2020-10-02 | Discharge: 2020-10-02 | Disposition: A | Discharge status: Home / Self Care | Payer: Medicaid Other | Source: Ambulatory Visit | Attending: Thoracic Surgery (Cardiothoracic Vascular Surgery) | Admitting: Thoracic Surgery (Cardiothoracic Vascular Surgery)

## 2020-10-02 ENCOUNTER — Other Ambulatory Visit: Payer: Self-pay

## 2020-10-02 DIAGNOSIS — Z01818 Encounter for other preprocedural examination: Secondary | ICD-10-CM

## 2020-10-02 DIAGNOSIS — Z01810 Encounter for preprocedural cardiovascular examination: Secondary | ICD-10-CM | POA: Diagnosis not present

## 2020-10-02 DIAGNOSIS — R222 Localized swelling, mass and lump, trunk: Secondary | ICD-10-CM

## 2020-10-02 HISTORY — DX: Dyspnea, unspecified: R06.00

## 2020-10-02 HISTORY — DX: Other specified postprocedural states: Z98.890

## 2020-10-02 HISTORY — DX: Nausea with vomiting, unspecified: R11.2

## 2020-10-02 HISTORY — DX: Nausea with vomiting, unspecified: Z98.890

## 2020-10-02 LAB — TYPE AND SCREEN
ABO/RH(D): B POS
Antibody Screen: NEGATIVE

## 2020-10-02 LAB — BLOOD GAS, ARTERIAL
Acid-base deficit: 1.1 mmol/L (ref 0.0–2.0)
Bicarbonate: 22.8 mmol/L (ref 20.0–28.0)
Drawn by: 58793
FIO2: 21
O2 Saturation: 98.5 %
Patient temperature: 37
pCO2 arterial: 35.7 mmHg (ref 32.0–48.0)
pH, Arterial: 7.42 (ref 7.350–7.450)
pO2, Arterial: 112 mmHg — ABNORMAL HIGH (ref 83.0–108.0)

## 2020-10-02 LAB — COMPREHENSIVE METABOLIC PANEL
ALT: 16 U/L (ref 0–44)
AST: 16 U/L (ref 15–41)
Albumin: 3.5 g/dL (ref 3.5–5.0)
Alkaline Phosphatase: 92 U/L (ref 38–126)
Anion gap: 10 (ref 5–15)
BUN: <5 mg/dL — ABNORMAL LOW (ref 6–20)
CO2: 22 mmol/L (ref 22–32)
Calcium: 8.5 mg/dL — ABNORMAL LOW (ref 8.9–10.3)
Chloride: 104 mmol/L (ref 98–111)
Creatinine, Ser: 0.58 mg/dL (ref 0.44–1.00)
GFR, Estimated: >60 mL/min (ref >60)
Glucose, Bld: 196 mg/dL — ABNORMAL HIGH (ref 70–99)
Potassium: 3.9 mmol/L (ref 3.5–5.1)
Sodium: 136 mmol/L (ref 135–145)
Total Bilirubin: 0.6 mg/dL (ref 0.3–1.2)
Total Protein: 7.2 g/dL (ref 6.5–8.1)

## 2020-10-02 LAB — URINALYSIS, ROUTINE W REFLEX MICROSCOPIC
Bilirubin Urine: NEGATIVE
Glucose, UA: >=500 mg/dL — AB
Hgb urine dipstick: NEGATIVE
Ketones, ur: NEGATIVE mg/dL
Leukocytes,Ua: NEGATIVE
Nitrite: NEGATIVE
Protein, ur: NEGATIVE mg/dL
Specific Gravity, Urine: 1.016 (ref 1.005–1.030)
pH: 5 (ref 5.0–8.0)

## 2020-10-02 LAB — SURGICAL PCR SCREEN
MRSA, PCR: NEGATIVE
Staphylococcus aureus: POSITIVE — AB

## 2020-10-02 LAB — CBC
HCT: 36 % (ref 36.0–46.0)
Hemoglobin: 11.8 g/dL — ABNORMAL LOW (ref 12.0–15.0)
MCH: 30.6 pg (ref 26.0–34.0)
MCHC: 32.8 g/dL (ref 30.0–36.0)
MCV: 93.5 fL (ref 80.0–100.0)
Platelets: 415 10*3/uL — ABNORMAL HIGH (ref 150–400)
RBC: 3.85 MIL/uL — ABNORMAL LOW (ref 3.87–5.11)
RDW: 12.3 % (ref 11.5–15.5)
WBC: 8.8 10*3/uL (ref 4.0–10.5)
nRBC: 0 % (ref 0.0–0.2)

## 2020-10-02 LAB — PROTIME-INR
INR: 1.2 (ref 0.8–1.2)
Prothrombin Time: 14.7 seconds (ref 11.4–15.2)

## 2020-10-02 LAB — APTT: aPTT: 32 seconds (ref 24–36)

## 2020-10-02 LAB — GLUCOSE, CAPILLARY: Glucose-Capillary: 251 mg/dL — ABNORMAL HIGH (ref 70–99)

## 2020-10-02 NOTE — Progress Notes (Addendum)
PCP - Andrena Mews MD Cardiologist - none Endocrinologist-Jeffrey Buddy Duty MD  PPM/ICD - denies Device Orders -  Rep Notified -   Chest x-ray - 10/02/20 EKG - 10/02/20 Stress Test - none ECHO -  Cardiac Cath -   Sleep Study - none CPAP - no  Fasting Blood Sugar - 104 Checks Blood Sugar 3-4 times a day  Blood Thinner Instructions:n/a Aspirin Instructions:n/a  ERAS Protcol -no PRE-SURGERY Ensure or G2- no  COVID TEST-no-patient tested positive 09/20/20.  Patient evaluated by Karoline Caldwell PA at PAT appointment.     Anesthesia review: no  Patient denies shortness of breath, fever, cough and chest pain at PAT appointment   All instructions explained to the patient, with a verbal understanding of the material. Patient agrees to go over the instructions while at home for a better understanding. Patient also instructed to self quarantine after being tested for COVID-19. The opportunity to ask questions was provided.

## 2020-10-03 NOTE — Progress Notes (Signed)
Anesthesia Chart Review:  Patient with recent diagnosis of COVID.  She reports mild symptoms started August 31 and she had a known exposure. She was tested on 9/2 with positive PCR.  Symptoms were treated at home, reported similar to common cold.  She took over-the-counter cold medications and improved with supportive care.  She retested on 9/12 and had a negative PCR test which she presented a picture of at PAT (she reports she retested simply because she wanted to know status with upcoming surgery).  Only lingering symptom is mild intermittent cough and occasional feeling of chest tightness, although she relates that this may be due to anxiety as it does not happen when she is exerting herself.  She also relates that she had feelings of chest tightness when she was initially diagnosed with chest wall mass.  Otherwise she feels well.  She had no shortness of breath walking from the parking lot to the PAT office.  She says she generally walks in the evenings for exercise, although has not done that since COVID diagnosis.  On exam she is in NAD, breathing is comfortable and unlabored, lungs CTAB, heart regular rate and rhythm no MRG.  Reviewed with Dr. Fransisco Beau due to presence of mild intermittent cough.  He advised in the absence of any other symptoms patient is okay to proceed with surgery as planned.  Has been seen by pulmonology recently with concern for OSA.  Sleep study has been ordered but not yet completed.  Type 1 diabetes followed by endocrinologist Dr. Buddy Duty. Last A1c 7.6 on 08/22/20.  Preop labs reviewed, unremarkable.  EKG 10/02/20: Normal sinus rhythm. Rate 83. Anterior infarct , age undetermined  CHEST - 2 VIEW 10/02/2020: COMPARISON:  12/22/2019, chest CT 01/02/2020   FINDINGS: Cardiomediastinal silhouette unchanged in size and contour. No evidence of central vascular congestion. No interlobular septal thickening. No pneumothorax or pleural effusion. No confluent airspace disease.    Peripheral lobulated density associated with the pleura of the right upper lobe again demonstrated. This was better characterized on prior cross-sectional imaging.   No acute displaced fracture   IMPRESSION: Negative for acute cardiopulmonary disease.   Redemonstration of lobulated pleural-based density of the right chest, better demonstrated on cross-sectional imaging.  MRI chest 05/18/2020: IMPRESSION: 1. Lobular fatty lesion in the lateral RIGHT pleural space extending through the intercostal space and deep to serattus musculature, lobular margins extending into the pleural space with thickened septation up to 3-4 mm raising the question of low-grade liposarcoma. 2. 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 Correlate with history and clinical symptoms and for any changes in symptoms that would suggest infection. Comparison with priors may be helpful. 3. Juxta diaphragmatic atelectasis.    Wynonia Musty [facility] Short Stay Center/Anesthesiology Phone [phone] 10/03/2020 11:56 AM

## 2020-10-03 NOTE — Anesthesia Preprocedure Evaluation (Deleted)
Anesthesia Evaluation    Airway        Dental   Pulmonary former smoker,           Cardiovascular hypertension,      Neuro/Psych    GI/Hepatic   Endo/Other  diabetes  Renal/GU      Musculoskeletal   Abdominal   Peds  Hematology   Anesthesia Other Findings   Reproductive/Obstetrics                             Anesthesia Physical Anesthesia Plan  ASA:   Anesthesia Plan:    Post-op Pain Management:    Induction:   PONV Risk Score and Plan:   Airway Management Planned:   Additional Equipment:   Intra-op Plan:   Post-operative Plan:   Informed Consent:   Plan Discussed with:   Anesthesia Plan Comments: (PAT note by Karoline Caldwell, PA-C: Patient with recent diagnosis of COVID.  She reports mild symptoms started August 31 and she had a known exposure. She was tested on 9/2 with positive PCR.  Symptoms were treated at home, reported similar to common cold.  She took over-the-counter cold medications and improved with supportive care.  She retested on 9/12 and had a negative PCR test which she presented a picture of at PAT (she reports she retested simply because she wanted to know status with upcoming surgery).  Only lingering symptom is mild intermittent cough and occasional feeling of chest tightness, although she relates that this may be due to anxiety as it does not happen when she is exerting herself.  She also relates that she had feelings of chest tightness when she was initially diagnosed with chest wall mass.  Otherwise she feels well.  She had no shortness of breath walking from the parking lot to the PAT office.  She says she generally walks in the evenings for exercise, although has not done that since COVID diagnosis.  On exam she is in NAD, breathing is comfortable and unlabored, lungs CTAB, heart regular rate and rhythm no MRG.  Reviewed with Dr. Fransisco Beau due to presence of mild  intermittent cough.  He advised in the absence of any other symptoms patient is okay to proceed with surgery as planned.  Has been seen by pulmonology recently with concern for OSA.  Sleep study has been ordered but not yet completed.  Type 1 diabetes followed by endocrinologist Dr. Buddy Duty. Last A1c 7.6 on 08/22/20.  Preop labs reviewed, unremarkable.  EKG 10/02/20: Normal sinus rhythm. Rate 83. Anterior infarct , age undetermined  CHEST - 2 VIEW 10/02/2020: COMPARISON: 12/22/2019, chest CT 01/02/2020  FINDINGS: Cardiomediastinal silhouette unchanged in size and contour. No evidence of central vascular congestion. No interlobular septal thickening. No pneumothorax or pleural effusion. No confluent airspace disease.  Peripheral lobulated density associated with the pleura of the right upper lobe again demonstrated. This was better characterized on prior cross-sectional imaging.  No acute displaced fracture  IMPRESSION: Negative for acute cardiopulmonary disease.  Redemonstration of lobulated pleural-based density of the right chest, better demonstrated on cross-sectional imaging.  MRI chest 05/18/2020: IMPRESSION: 1. Lobular fatty lesion in the lateral RIGHT pleural space extending through the intercostal space and deep to serattus musculature, lobular margins extending into the pleural space with thickened septation up to 3-4 mm raising the question of low-grade liposarcoma. 2. 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  Correlate with history and clinical symptoms and for any changes in symptoms that would suggest infection. Comparison with priors may be helpful. 3. Juxta diaphragmatic atelectasis.  )        Anesthesia Quick Evaluation

## 2020-10-04 ENCOUNTER — Ambulatory Visit (HOSPITAL_COMMUNITY)
Admission: RE | Admit: 2020-10-04 | Discharge: 2020-10-04 | Disposition: A | Discharge status: Home / Self Care | Payer: Medicaid Other | Attending: Thoracic Surgery (Cardiothoracic Vascular Surgery) | Admitting: Thoracic Surgery (Cardiothoracic Vascular Surgery)

## 2020-10-04 ENCOUNTER — Other Ambulatory Visit: Payer: Self-pay | Admitting: *Deleted

## 2020-10-04 ENCOUNTER — Encounter (HOSPITAL_COMMUNITY)
Admission: RE | Disposition: A | Discharge status: Home / Self Care | Payer: Self-pay | Source: Home / Self Care | Attending: Thoracic Surgery (Cardiothoracic Vascular Surgery)

## 2020-10-04 ENCOUNTER — Encounter (HOSPITAL_COMMUNITY): Payer: Self-pay | Admitting: Thoracic Surgery (Cardiothoracic Vascular Surgery)

## 2020-10-04 DIAGNOSIS — K0889 Other specified disorders of teeth and supporting structures: Secondary | ICD-10-CM | POA: Insufficient documentation

## 2020-10-04 DIAGNOSIS — Z539 Procedure and treatment not carried out, unspecified reason: Secondary | ICD-10-CM | POA: Diagnosis not present

## 2020-10-04 DIAGNOSIS — R222 Localized swelling, mass and lump, trunk: Secondary | ICD-10-CM | POA: Insufficient documentation

## 2020-10-04 LAB — POCT PREGNANCY, URINE: Preg Test, Ur: NEGATIVE

## 2020-10-04 LAB — GLUCOSE, CAPILLARY: Glucose-Capillary: 241 mg/dL — ABNORMAL HIGH (ref 70–99)

## 2020-10-04 SURGERY — EXCISION, MASS, MEDIASTINUM, ROBOT-ASSISTED
Anesthesia: General | Laterality: Right

## 2020-10-04 MED ORDER — LACTATED RINGERS IV SOLN: INTRAVENOUS | Status: DC

## 2020-10-04 MED ORDER — ORAL CARE MOUTH RINSE
15.0000 mL | Freq: Once | OROMUCOSAL | Status: AC
Start: 2020-10-04 — End: 2020-10-04

## 2020-10-04 MED ORDER — CHLORHEXIDINE GLUCONATE 0.12 % MT SOLN
15.0000 mL | Freq: Once | OROMUCOSAL | Status: AC
  Administered 2020-10-04: 15 mL via OROMUCOSAL
  Filled 2020-10-04: qty 15

## 2020-10-04 MED ORDER — CEFAZOLIN SODIUM-DEXTROSE 2-4 GM/100ML-% IV SOLN
2.0000 g | INTRAVENOUS | Status: DC
  Filled 2020-10-04: qty 100

## 2020-10-04 NOTE — Progress Notes (Addendum)
Pt c/o toothache that started last night. Dr. Roxan Hockey made aware. Verbal order received to cancel procedure. Pt instructed to contact her dentist's office to take care of this. Dr. Roxan Hockey to be in contact wit her later today.

## 2020-10-04 NOTE — Progress Notes (Signed)
PIV removed. Catheter tip intact. Dressing in place. Pt got dressed and discharged home, accompanied by daughter.

## 2020-10-06 NOTE — H&P (Signed)
Patient presented with toothache  She will have that addressed by dentist and surgery will be rescheduled  Barbara Lipps C. Roxan Hockey, MD [facility] [phone]

## 2020-10-14 ENCOUNTER — Telehealth: Payer: Self-pay

## 2020-10-14 NOTE — Telephone Encounter (Signed)
I scheduled pt's hst for tomorrow and rescheduled her appt with CY.  She states was only coming tomorrow for follow up after sleep study.  Nothing further needed.

## 2020-10-14 NOTE — Telephone Encounter (Signed)
Patient has an appt tomorrow. While doing chart checks we noted she has not had her Sleep Study. New order placed.   Call made to patient, confirmed DOB. She states she was never contacted regarding the sleep study and she does want to move forward with it.   [facility] please contact patient to schedule Sleep Study as soon as possible. Thanks :)

## 2020-10-15 ENCOUNTER — Ambulatory Visit: Payer: Medicaid Other

## 2020-10-15 ENCOUNTER — Ambulatory Visit: Payer: Medicaid Other | Admitting: Internal Medicine

## 2020-10-15 ENCOUNTER — Other Ambulatory Visit: Payer: Self-pay

## 2020-10-15 DIAGNOSIS — G4733 Obstructive sleep apnea (adult) (pediatric): Secondary | ICD-10-CM | POA: Insufficient documentation

## 2020-10-15 DIAGNOSIS — G4730 Sleep apnea, unspecified: Secondary | ICD-10-CM

## 2020-10-15 HISTORY — DX: Sleep apnea, unspecified: G47.30

## 2020-10-16 DIAGNOSIS — G4733 Obstructive sleep apnea (adult) (pediatric): Secondary | ICD-10-CM

## 2020-10-30 ENCOUNTER — Telehealth: Payer: Self-pay | Admitting: Internal Medicine

## 2020-10-30 DIAGNOSIS — G4733 Obstructive sleep apnea (adult) (pediatric): Secondary | ICD-10-CM

## 2020-10-30 NOTE — Telephone Encounter (Signed)
Home sleep test showed moderate obstructive sleep apnea, averaging 26 apneas/ hour with drops in blood oxygen level  I recommend we order new DME, new CPAP, auto 5-20, mask of choice, humidifier, supplies, AirView/ card  She will need ov 31-90 days after getting her machine, per insurance regs.

## 2020-10-30 NOTE — Telephone Encounter (Signed)
CY please advise of HST results when available. Thanks

## 2020-10-30 NOTE — Telephone Encounter (Signed)
Call made to patient, confirmed DOB. Made aware of Hst results. Voiced understanding. Order placed. Informed patient there is a national delay with cpap machines and there will be a wait. Voiced understanding.   Nothing further needed at this time.

## 2020-11-06 HISTORY — PX: DENTAL RESTORATION/EXTRACTION WITH X-RAY: SHX5796

## 2020-11-07 ENCOUNTER — Other Ambulatory Visit: Payer: Self-pay | Admitting: *Deleted

## 2020-11-07 ENCOUNTER — Encounter: Payer: Self-pay | Admitting: *Deleted

## 2020-11-07 DIAGNOSIS — R222 Localized swelling, mass and lump, trunk: Secondary | ICD-10-CM

## 2020-11-16 ENCOUNTER — Other Ambulatory Visit: Payer: Self-pay | Admitting: Family Medicine

## 2020-11-25 NOTE — Progress Notes (Signed)
Surgical Instructions    Your procedure is scheduled on Thursday November 10th.  Report to [facility] Main Entrance "A" at 6 A.M., then check in with the Admitting office.  Call this number if you have problems the morning of surgery:  [phone]   If you have any questions prior to your surgery date call [phone]: Open Monday-Friday 8am-4pm    Remember:  Do not eat or drink after midnight the night before your surgery     Take these medicines the morning of surgery with A SIP OF WATER NONE   As of today, STOP taking any Aspirin (unless otherwise instructed by your surgeon) Aleve, Naproxen, Ibuprofen, Motrin, Advil, Goody's, BC's, all herbal medications, fish oil, and all vitamins.   WHAT DO I DO ABOUT MY DIABETES MEDICATION?   Do not take oral diabetes medicines (pills) the morning of surgery.  Contact primary care provider or endocrinologist for best recommendation. If unable to reach either then follow instruction below.   THE NIGHT BEFORE SURGERY, Reduce all basal rates by 20% at midnight.   THE MORNING OF SURGERY Keep all basal rates at reduced rate of 20% or lower.  The day of surgery, do not take other diabetes injectables, including Byetta (exenatide), Bydureon (exenatide ER), Victoza (liraglutide), or Trulicity (dulaglutide).  If your CBG is greater than 220 mg/dL, you may take  of your sliding scale (correction) dose of insulin.   HOW TO MANAGE YOUR DIABETES BEFORE AND AFTER SURGERY  Why is it important to control my blood sugar before and after surgery? Improving blood sugar levels before and after surgery helps healing and can limit problems. A way of improving blood sugar control is eating a healthy diet by:  Eating less sugar and carbohydrates  Increasing activity/exercise  Talking with your doctor about reaching your blood sugar goals High blood sugars (greater than 180 mg/dL) can raise your risk of infections and slow your recovery, so you will  need to focus on controlling your diabetes during the weeks before surgery. Make sure that the doctor who takes care of your diabetes knows about your planned surgery including the date and location.  How do I manage my blood sugar before surgery? Check your blood sugar at least 4 times a day, starting 2 days before surgery, to make sure that the level is not too high or low.  Check your blood sugar the morning of your surgery when you wake up and every 2 hours until you get to the Short Stay unit.  If your blood sugar is less than 70 mg/dL, you will need to treat for low blood sugar: Do not take insulin. Treat a low blood sugar (less than 70 mg/dL) with  cup of clear juice (cranberry or apple), 4 glucose tablets, OR glucose gel. Recheck blood sugar in 15 minutes after treatment (to make sure it is greater than 70 mg/dL). If your blood sugar is not greater than 70 mg/dL on recheck, call [phone] for further instructions. Report your blood sugar to the short stay nurse when you get to Short Stay.  If you are admitted to the hospital after surgery: Your blood sugar will be checked by the staff and you will probably be given insulin after surgery (instead of oral diabetes medicines) to make sure you have good blood sugar levels. The goal for blood sugar control after surgery is 80-180 mg/dL.     After your COVID test   You are not required to quarantine however you are required  to wear a well-fitting mask when you are out and around people not in your household.  If your mask becomes wet or soiled, replace with a new one.  Wash your hands often with soap and water for 20 seconds or clean your hands with an alcohol-based hand sanitizer that contains at least 60% alcohol.  Do not share personal items.  Notify your provider: if you are in close contact with someone who has COVID  or if you develop a fever of 100.4 or greater, sneezing, cough, sore throat, shortness of breath or body  aches.             Do not wear jewelry or makeup Do not wear lotions, powders, perfumes, or deodorant. Do not shave 48 hours prior to surgery.   Do not bring valuables to the hospital. DO Not wear nail polish, gel polish, artificial nails, or any other type of covering on natural nails including finger and toenails. If patients have artificial nails, gel coating, etc. that need to be removed by a nail salon, please have this removed prior to surgery or surgery may need to be canceled/delayed if the surgeon/ anesthesia feels like the patient is unable to be adequately monitored.             [facility] is not responsible for any belongings or valuables.  Do NOT Smoke (Tobacco/Vaping)  24 hours prior to your procedure  If you use a CPAP at night, you may bring your mask for your overnight stay.   Contacts, glasses, hearing aids, dentures or partials may not be worn into surgery, please bring cases for these belongings   For patients admitted to the hospital, discharge time will be determined by your treatment team.   Patients discharged the day of surgery will not be allowed to drive home, and someone needs to stay with them for 24 hours.  NO VISITORS WILL BE ALLOWED IN PRE-OP WHERE PATIENTS ARE PREPPED FOR SURGERY.  ONLY 1 SUPPORT PERSON MAY BE PRESENT IN THE WAITING ROOM WHILE YOU ARE IN SURGERY.  IF YOU ARE TO BE ADMITTED, ONCE YOU ARE IN YOUR ROOM YOU WILL BE ALLOWED TWO (2) VISITORS. 1 (ONE) VISITOR MAY STAY OVERNIGHT BUT MUST ARRIVE TO THE ROOM BY 8pm.  Minor children may have two parents present. Special consideration for safety and communication needs will be reviewed on a case by case basis.  Special instructions:    Oral Hygiene is also important to reduce your risk of infection.  Remember - BRUSH YOUR TEETH THE MORNING OF SURGERY WITH YOUR REGULAR TOOTHPASTE   [facility]- Preparing For Surgery  Before surgery, you can play an important role. Because skin is not sterile,  your skin needs to be as free of germs as possible. You can reduce the number of germs on your skin by washing with CHG (chlorahexidine gluconate) Soap before surgery.  CHG is an antiseptic cleaner which kills germs and bonds with the skin to continue killing germs even after washing.     Please do not use if you have an allergy to CHG or antibacterial soaps. If your skin becomes reddened/irritated stop using the CHG.  Do not shave (including legs and underarms) for at least 48 hours prior to first CHG shower. It is OK to shave your face.  Please follow these instructions carefully.     Shower the NIGHT BEFORE SURGERY and the MORNING OF SURGERY with CHG Soap.   If you chose to wash your hair,  wash your hair first as usual with your normal shampoo. After you shampoo, rinse your hair and body thoroughly to remove the shampoo.  Then [employer] and genitals (private parts) with your normal soap and rinse thoroughly to remove soap.  After that Use CHG Soap as you would any other liquid soap. You can apply CHG directly to the skin and wash gently with a scrungie or a clean washcloth.   Apply the CHG Soap to your body ONLY FROM THE NECK DOWN.  Do not use on open wounds or open sores. Avoid contact with your eyes, ears, mouth and genitals (private parts). Wash Face and genitals (private parts)  with your normal soap.   Wash thoroughly, paying special attention to the area where your surgery will be performed.  Thoroughly rinse your body with warm water from the neck down.  DO NOT shower/wash with your normal soap after using and rinsing off the CHG Soap.  Pat yourself dry with a CLEAN TOWEL.  Wear CLEAN PAJAMAS to bed the night before surgery  Place CLEAN SHEETS on your bed the night before your surgery  DO NOT SLEEP WITH PETS.   Day of Surgery:  Take a shower with CHG soap. Wear Clean/Comfortable clothing the morning of surgery Do not apply any deodorants/lotions.   Remember to brush your  teeth WITH YOUR REGULAR TOOTHPASTE.   Please read over the following fact sheets that you were given.

## 2020-11-26 ENCOUNTER — Encounter (HOSPITAL_COMMUNITY)
Admission: RE | Admit: 2020-11-26 | Discharge: 2020-11-26 | Disposition: A | Discharge status: Home / Self Care | Payer: Medicaid Other | Source: Ambulatory Visit | Attending: Thoracic Surgery (Cardiothoracic Vascular Surgery) | Admitting: Thoracic Surgery (Cardiothoracic Vascular Surgery)

## 2020-11-26 ENCOUNTER — Ambulatory Visit (HOSPITAL_COMMUNITY)
Admission: RE | Admit: 2020-11-26 | Discharge: 2020-11-26 | Disposition: A | Discharge status: Home / Self Care | Payer: Medicaid Other | Source: Ambulatory Visit | Attending: Thoracic Surgery (Cardiothoracic Vascular Surgery) | Admitting: Thoracic Surgery (Cardiothoracic Vascular Surgery)

## 2020-11-26 ENCOUNTER — Other Ambulatory Visit: Payer: Self-pay

## 2020-11-26 ENCOUNTER — Encounter (HOSPITAL_COMMUNITY): Payer: Self-pay

## 2020-11-26 DIAGNOSIS — Z20822 Contact with and (suspected) exposure to covid-19: Secondary | ICD-10-CM | POA: Insufficient documentation

## 2020-11-26 DIAGNOSIS — R9389 Abnormal findings on diagnostic imaging of other specified body structures: Secondary | ICD-10-CM | POA: Insufficient documentation

## 2020-11-26 DIAGNOSIS — R222 Localized swelling, mass and lump, trunk: Secondary | ICD-10-CM | POA: Diagnosis not present

## 2020-11-26 DIAGNOSIS — Z01818 Encounter for other preprocedural examination: Secondary | ICD-10-CM | POA: Insufficient documentation

## 2020-11-26 LAB — COMPREHENSIVE METABOLIC PANEL
ALT: 11 U/L (ref 0–44)
AST: 15 U/L (ref 15–41)
Albumin: 3.3 g/dL — ABNORMAL LOW (ref 3.5–5.0)
Alkaline Phosphatase: 127 U/L — ABNORMAL HIGH (ref 38–126)
Anion gap: 9 (ref 5–15)
BUN: 12 mg/dL (ref 6–20)
CO2: 23 mmol/L (ref 22–32)
Calcium: 8.6 mg/dL — ABNORMAL LOW (ref 8.9–10.3)
Chloride: 103 mmol/L (ref 98–111)
Creatinine, Ser: 0.71 mg/dL (ref 0.44–1.00)
GFR, Estimated: >60 mL/min (ref >60)
Glucose, Bld: 211 mg/dL — ABNORMAL HIGH (ref 70–99)
Potassium: 4.2 mmol/L (ref 3.5–5.1)
Sodium: 135 mmol/L (ref 135–145)
Total Bilirubin: 0.7 mg/dL (ref 0.3–1.2)
Total Protein: 6.8 g/dL (ref 6.5–8.1)

## 2020-11-26 LAB — TYPE AND SCREEN
ABO/RH(D): B POS
Antibody Screen: NEGATIVE

## 2020-11-26 LAB — APTT: aPTT: 31 seconds (ref 24–36)

## 2020-11-26 LAB — CBC
HCT: 37 % (ref 36.0–46.0)
Hemoglobin: 11.7 g/dL — ABNORMAL LOW (ref 12.0–15.0)
MCH: 29.8 pg (ref 26.0–34.0)
MCHC: 31.6 g/dL (ref 30.0–36.0)
MCV: 94.1 fL (ref 80.0–100.0)
Platelets: 404 10*3/uL — ABNORMAL HIGH (ref 150–400)
RBC: 3.93 MIL/uL (ref 3.87–5.11)
RDW: 12.2 % (ref 11.5–15.5)
WBC: 9.3 10*3/uL (ref 4.0–10.5)
nRBC: 0 % (ref 0.0–0.2)

## 2020-11-26 LAB — URINALYSIS, ROUTINE W REFLEX MICROSCOPIC
Bilirubin Urine: NEGATIVE
Glucose, UA: 50 mg/dL — AB
Hgb urine dipstick: NEGATIVE
Ketones, ur: NEGATIVE mg/dL
Leukocytes,Ua: NEGATIVE
Nitrite: NEGATIVE
Protein, ur: NEGATIVE mg/dL
Specific Gravity, Urine: 1.012 (ref 1.005–1.030)
pH: 6 (ref 5.0–8.0)

## 2020-11-26 LAB — BLOOD GAS, ARTERIAL
Acid-base deficit: 1 mmol/L (ref 0.0–2.0)
Bicarbonate: 23.1 mmol/L (ref 20.0–28.0)
Drawn by: 58793
FIO2: 21
O2 Saturation: 97.3 %
Patient temperature: 37
pCO2 arterial: 38.3 mmHg (ref 32.0–48.0)
pH, Arterial: 7.399 (ref 7.350–7.450)
pO2, Arterial: 94.5 mmHg (ref 83.0–108.0)

## 2020-11-26 LAB — GLUCOSE, CAPILLARY: Glucose-Capillary: 195 mg/dL — ABNORMAL HIGH (ref 70–99)

## 2020-11-26 LAB — PROTIME-INR
INR: 1.2 (ref 0.8–1.2)
Prothrombin Time: 15.3 seconds — ABNORMAL HIGH (ref 11.4–15.2)

## 2020-11-26 LAB — SURGICAL PCR SCREEN
MRSA, PCR: NEGATIVE
Staphylococcus aureus: POSITIVE — AB

## 2020-11-26 NOTE — Progress Notes (Addendum)
PCP - Dr. Andrena Mews Cardiologist - Denies  Chest x-ray - 11/26/20 EKG - 11/26/20  Sleep Study - Yes has OSA CPAP - Has been ordered  DM Type I Fasting Blood Sugar - 94-200 Checks Blood Sugar __4-5___ times a day A1C 7.6 on 11/25/20 per patient requesting record from Dr Buddy Duty CBG at PAT appt 195 eaten the hour  COVID TEST- 11/26/20   Anesthesia review: Yes DM type I on insulin pump  Patient denies shortness of breath, fever, cough and chest pain at PAT appointment   All instructions explained to the patient, with a verbal understanding of the material. Patient agrees to go over the instructions while at home for a better understanding. Patient also instructed to wear a mask while in public after being tested for COVID-19. The opportunity to ask questions was provided.  Spoke to [place] with diabetes coordinator to give her information on surgery. 11/26/20 @ 1354

## 2020-11-27 LAB — SARS CORONAVIRUS 2 (TAT 6-24 HRS): SARS Coronavirus 2: NEGATIVE

## 2020-11-28 ENCOUNTER — Inpatient Hospital Stay (HOSPITAL_COMMUNITY): Payer: Medicaid Other

## 2020-11-28 ENCOUNTER — Inpatient Hospital Stay (HOSPITAL_COMMUNITY): Payer: Medicaid Other | Admitting: Vascular Surgery

## 2020-11-28 ENCOUNTER — Encounter (HOSPITAL_COMMUNITY)
Admission: RE | Disposition: A | Discharge status: Home / Self Care | Payer: Self-pay | Source: Home / Self Care | Attending: Thoracic Surgery (Cardiothoracic Vascular Surgery)

## 2020-11-28 ENCOUNTER — Encounter (HOSPITAL_COMMUNITY): Payer: Self-pay | Admitting: Thoracic Surgery (Cardiothoracic Vascular Surgery)

## 2020-11-28 ENCOUNTER — Inpatient Hospital Stay (HOSPITAL_COMMUNITY): Payer: Medicaid Other | Admitting: Certified Registered"

## 2020-11-28 ENCOUNTER — Inpatient Hospital Stay (HOSPITAL_COMMUNITY)
Admission: RE | Admit: 2020-11-28 | Discharge: 2020-12-03 | DRG: 167 | Disposition: A | Discharge status: Home / Self Care | Payer: Medicaid Other | Attending: Thoracic Surgery (Cardiothoracic Vascular Surgery) | Admitting: Thoracic Surgery (Cardiothoracic Vascular Surgery)

## 2020-11-28 ENCOUNTER — Other Ambulatory Visit: Payer: Self-pay

## 2020-11-28 DIAGNOSIS — I10 Essential (primary) hypertension: Secondary | ICD-10-CM | POA: Diagnosis present

## 2020-11-28 DIAGNOSIS — E669 Obesity, unspecified: Secondary | ICD-10-CM | POA: Diagnosis present

## 2020-11-28 DIAGNOSIS — Z791 Long term (current) use of non-steroidal anti-inflammatories (NSAID): Secondary | ICD-10-CM

## 2020-11-28 DIAGNOSIS — E10649 Type 1 diabetes mellitus with hypoglycemia without coma: Secondary | ICD-10-CM | POA: Diagnosis not present

## 2020-11-28 DIAGNOSIS — E109 Type 1 diabetes mellitus without complications: Secondary | ICD-10-CM | POA: Diagnosis present

## 2020-11-28 DIAGNOSIS — G8929 Other chronic pain: Secondary | ICD-10-CM | POA: Diagnosis present

## 2020-11-28 DIAGNOSIS — Z6836 Body mass index (BMI) 36.0-36.9, adult: Secondary | ICD-10-CM | POA: Diagnosis not present

## 2020-11-28 DIAGNOSIS — K5900 Constipation, unspecified: Secondary | ICD-10-CM | POA: Diagnosis present

## 2020-11-28 DIAGNOSIS — E871 Hypo-osmolality and hyponatremia: Secondary | ICD-10-CM | POA: Diagnosis not present

## 2020-11-28 DIAGNOSIS — F32A Depression, unspecified: Secondary | ICD-10-CM | POA: Diagnosis present

## 2020-11-28 DIAGNOSIS — Z20822 Contact with and (suspected) exposure to covid-19: Secondary | ICD-10-CM | POA: Diagnosis present

## 2020-11-28 DIAGNOSIS — Z9851 Tubal ligation status: Secondary | ICD-10-CM

## 2020-11-28 DIAGNOSIS — Z79899 Other long term (current) drug therapy: Secondary | ICD-10-CM

## 2020-11-28 DIAGNOSIS — R309 Painful micturition, unspecified: Secondary | ICD-10-CM | POA: Diagnosis not present

## 2020-11-28 DIAGNOSIS — R222 Localized swelling, mass and lump, trunk: Principal | ICD-10-CM | POA: Diagnosis present

## 2020-11-28 DIAGNOSIS — Z794 Long term (current) use of insulin: Secondary | ICD-10-CM | POA: Diagnosis not present

## 2020-11-28 DIAGNOSIS — L723 Sebaceous cyst: Secondary | ICD-10-CM | POA: Diagnosis present

## 2020-11-28 DIAGNOSIS — Z9641 Presence of insulin pump (external) (internal): Secondary | ICD-10-CM | POA: Diagnosis present

## 2020-11-28 DIAGNOSIS — N390 Urinary tract infection, site not specified: Secondary | ICD-10-CM | POA: Diagnosis not present

## 2020-11-28 DIAGNOSIS — R109 Unspecified abdominal pain: Secondary | ICD-10-CM | POA: Diagnosis not present

## 2020-11-28 DIAGNOSIS — R000 Tachycardia, unspecified: Secondary | ICD-10-CM | POA: Diagnosis not present

## 2020-11-28 DIAGNOSIS — J9382 Other air leak: Secondary | ICD-10-CM | POA: Diagnosis not present

## 2020-11-28 DIAGNOSIS — M7501 Adhesive capsulitis of right shoulder: Secondary | ICD-10-CM | POA: Diagnosis present

## 2020-11-28 DIAGNOSIS — J9811 Atelectasis: Secondary | ICD-10-CM | POA: Diagnosis not present

## 2020-11-28 DIAGNOSIS — E860 Dehydration: Secondary | ICD-10-CM | POA: Diagnosis not present

## 2020-11-28 DIAGNOSIS — D72829 Elevated white blood cell count, unspecified: Secondary | ICD-10-CM | POA: Diagnosis not present

## 2020-11-28 DIAGNOSIS — Z8619 Personal history of other infectious and parasitic diseases: Secondary | ICD-10-CM

## 2020-11-28 DIAGNOSIS — Z87891 Personal history of nicotine dependence: Secondary | ICD-10-CM

## 2020-11-28 DIAGNOSIS — Z8249 Family history of ischemic heart disease and other diseases of the circulatory system: Secondary | ICD-10-CM

## 2020-11-28 DIAGNOSIS — Z833 Family history of diabetes mellitus: Secondary | ICD-10-CM

## 2020-11-28 DIAGNOSIS — D62 Acute posthemorrhagic anemia: Secondary | ICD-10-CM | POA: Diagnosis not present

## 2020-11-28 DIAGNOSIS — J939 Pneumothorax, unspecified: Secondary | ICD-10-CM

## 2020-11-28 DIAGNOSIS — F419 Anxiety disorder, unspecified: Secondary | ICD-10-CM | POA: Diagnosis present

## 2020-11-28 DIAGNOSIS — R42 Dizziness and giddiness: Secondary | ICD-10-CM | POA: Diagnosis not present

## 2020-11-28 DIAGNOSIS — D174 Benign lipomatous neoplasm of intrathoracic organs: Secondary | ICD-10-CM | POA: Diagnosis present

## 2020-11-28 DIAGNOSIS — R12 Heartburn: Secondary | ICD-10-CM | POA: Diagnosis present

## 2020-11-28 HISTORY — PX: INTERCOSTAL NERVE BLOCK: SHX5021

## 2020-11-28 LAB — GLUCOSE, CAPILLARY
Glucose-Capillary: 156 mg/dL — ABNORMAL HIGH (ref 70–99)
Glucose-Capillary: 194 mg/dL — ABNORMAL HIGH (ref 70–99)
Glucose-Capillary: 271 mg/dL — ABNORMAL HIGH (ref 70–99)
Glucose-Capillary: 288 mg/dL — ABNORMAL HIGH (ref 70–99)
Glucose-Capillary: 295 mg/dL — ABNORMAL HIGH (ref 70–99)
Glucose-Capillary: 241 mg/dL — ABNORMAL HIGH (ref 70–99)
Glucose-Capillary: 256 mg/dL — ABNORMAL HIGH (ref 70–99)
Glucose-Capillary: 235 mg/dL — ABNORMAL HIGH (ref 70–99)

## 2020-11-28 LAB — POCT I-STAT, CHEM 8
BUN: 9 mg/dL (ref 6–20)
Calcium, Ion: 1.23 mmol/L (ref 1.15–1.40)
Chloride: 103 mmol/L (ref 98–111)
Creatinine, Ser: 0.5 mg/dL (ref 0.44–1.00)
Glucose, Bld: 158 mg/dL — ABNORMAL HIGH (ref 70–99)
HCT: 34 % — ABNORMAL LOW (ref 36.0–46.0)
Hemoglobin: 11.6 g/dL — ABNORMAL LOW (ref 12.0–15.0)
Potassium: 4.4 mmol/L (ref 3.5–5.1)
Sodium: 137 mmol/L (ref 135–145)
TCO2: 25 mmol/L (ref 22–32)

## 2020-11-28 LAB — POCT PREGNANCY, URINE: Preg Test, Ur: NEGATIVE

## 2020-11-28 SURGERY — WEDGE RESECTION, LUNG, ROBOT-ASSISTED, THORACOSCOPIC
Anesthesia: General | Site: Chest | Laterality: Right

## 2020-11-28 MED ORDER — POTASSIUM CHLORIDE IN NACL 20-0.9 MEQ/L-% IV SOLN
INTRAVENOUS | Status: DC
  Filled 2020-11-28 (×3): qty 1000

## 2020-11-28 MED ORDER — ONDANSETRON HCL 4 MG/2ML IJ SOLN
INTRAMUSCULAR | Status: AC
  Filled 2020-11-28: qty 2

## 2020-11-28 MED ORDER — LACTATED RINGERS IV SOLN: INTRAVENOUS | Status: DC | PRN

## 2020-11-28 MED ORDER — ENOXAPARIN SODIUM 40 MG/0.4ML IJ SOSY
40.0000 mg | PREFILLED_SYRINGE | INTRAMUSCULAR | Status: DC
  Administered 2020-11-29 – 2020-12-03 (×5): 40 mg via SUBCUTANEOUS
  Filled 2020-11-28 (×5): qty 0.4

## 2020-11-28 MED ORDER — KETOROLAC TROMETHAMINE 30 MG/ML IJ SOLN
30.0000 mg | Freq: Four times a day (QID) | INTRAMUSCULAR | Status: AC
  Administered 2020-11-28 – 2020-11-30 (×9): 30 mg via INTRAVENOUS
  Filled 2020-11-28 (×9): qty 1

## 2020-11-28 MED ORDER — FENTANYL CITRATE (PF) 250 MCG/5ML IJ SOLN
INTRAMUSCULAR | Status: AC
  Filled 2020-11-28: qty 5

## 2020-11-28 MED ORDER — FENTANYL CITRATE (PF) 100 MCG/2ML IJ SOLN
INTRAMUSCULAR | Status: AC
  Filled 2020-11-28: qty 2

## 2020-11-28 MED ORDER — ROCURONIUM BROMIDE 10 MG/ML (PF) SYRINGE
PREFILLED_SYRINGE | INTRAVENOUS | Status: DC | PRN
  Administered 2020-11-28: 70 mg via INTRAVENOUS
  Administered 2020-11-28: 20 mg via INTRAVENOUS

## 2020-11-28 MED ORDER — LACTATED RINGERS IV SOLN: INTRAVENOUS | Status: DC

## 2020-11-28 MED ORDER — ACETAMINOPHEN 500 MG PO TABS: 1000.0000 mg | ORAL_TABLET | Freq: Once | ORAL | Status: DC

## 2020-11-28 MED ORDER — ACETAMINOPHEN 10 MG/ML IV SOLN
INTRAVENOUS | Status: DC | PRN
  Administered 2020-11-28: 1000 mg via INTRAVENOUS

## 2020-11-28 MED ORDER — DEXAMETHASONE SODIUM PHOSPHATE 10 MG/ML IJ SOLN
INTRAMUSCULAR | Status: DC | PRN
  Administered 2020-11-28: 5 mg via INTRAVENOUS

## 2020-11-28 MED ORDER — ORAL CARE MOUTH RINSE: 15.0000 mL | Freq: Once | OROMUCOSAL | Status: AC

## 2020-11-28 MED ORDER — LOSARTAN POTASSIUM 25 MG PO TABS
25.0000 mg | ORAL_TABLET | Freq: Every day | ORAL | Status: DC
  Administered 2020-11-29 – 2020-12-02 (×4): 25 mg via ORAL
  Filled 2020-11-28 (×4): qty 1

## 2020-11-28 MED ORDER — PROPOFOL 10 MG/ML IV BOLUS
INTRAVENOUS | Status: AC
  Filled 2020-11-28: qty 20

## 2020-11-28 MED ORDER — CEFAZOLIN SODIUM-DEXTROSE 2-4 GM/100ML-% IV SOLN
2.0000 g | INTRAVENOUS | Status: AC
  Administered 2020-11-28: 2 g via INTRAVENOUS
  Filled 2020-11-28: qty 100

## 2020-11-28 MED ORDER — FENTANYL CITRATE PF 50 MCG/ML IJ SOSY: 25.0000 ug | PREFILLED_SYRINGE | INTRAMUSCULAR | Status: DC | PRN

## 2020-11-28 MED ORDER — CHLORHEXIDINE GLUCONATE 0.12 % MT SOLN
15.0000 mL | Freq: Once | OROMUCOSAL | Status: AC
  Administered 2020-11-28: 15 mL via OROMUCOSAL
  Filled 2020-11-28: qty 15

## 2020-11-28 MED ORDER — INSULIN PUMP
Freq: Three times a day (TID) | SUBCUTANEOUS | Status: DC
  Administered 2020-11-28: 4.7 via SUBCUTANEOUS
  Administered 2020-11-29: 0.2 via SUBCUTANEOUS
  Administered 2020-11-29: 1 via SUBCUTANEOUS
  Administered 2020-11-29: 10.6 via SUBCUTANEOUS
  Administered 2020-11-30: 1.1 via SUBCUTANEOUS
  Administered 2020-11-30: 9.5 via SUBCUTANEOUS
  Administered 2020-12-02: 16.3 via SUBCUTANEOUS
  Administered 2020-12-02: 2 via SUBCUTANEOUS
  Administered 2020-12-02: 12 via SUBCUTANEOUS
  Administered 2020-12-02: 16.1 via SUBCUTANEOUS
  Administered 2020-12-03: 3 via SUBCUTANEOUS
  Filled 2020-11-28: qty 1

## 2020-11-28 MED ORDER — DEXAMETHASONE SODIUM PHOSPHATE 10 MG/ML IJ SOLN
INTRAMUSCULAR | Status: AC
  Filled 2020-11-28: qty 1

## 2020-11-28 MED ORDER — INSULIN ASPART 100 UNIT/ML IJ SOLN
0.0000 [IU] | Freq: Four times a day (QID) | INTRAMUSCULAR | Status: DC
  Administered 2020-11-28: 12 [IU] via SUBCUTANEOUS

## 2020-11-28 MED ORDER — ACETAMINOPHEN 160 MG/5ML PO SOLN: 1000.0000 mg | Freq: Four times a day (QID) | ORAL | Status: DC

## 2020-11-28 MED ORDER — 0.9 % SODIUM CHLORIDE (POUR BTL) OPTIME
TOPICAL | Status: DC | PRN
  Administered 2020-11-28: 2000 mL

## 2020-11-28 MED ORDER — MIDAZOLAM HCL 2 MG/2ML IJ SOLN
INTRAMUSCULAR | Status: DC | PRN
  Administered 2020-11-28: 2 mg via INTRAVENOUS

## 2020-11-28 MED ORDER — PROMETHAZINE HCL 25 MG/ML IJ SOLN
INTRAMUSCULAR | Status: AC
  Filled 2020-11-28: qty 1

## 2020-11-28 MED ORDER — PROPOFOL 500 MG/50ML IV EMUL
INTRAVENOUS | Status: DC | PRN
  Administered 2020-11-28: 30 ug/kg/min via INTRAVENOUS

## 2020-11-28 MED ORDER — PROMETHAZINE HCL 25 MG/ML IJ SOLN
6.2500 mg | Freq: Once | INTRAMUSCULAR | Status: AC
  Administered 2020-11-28: 6.25 mg via INTRAVENOUS

## 2020-11-28 MED ORDER — SODIUM CHLORIDE 0.9 % IR SOLN
Status: DC | PRN
  Administered 2020-11-28: 1000 mL

## 2020-11-28 MED ORDER — FENTANYL CITRATE (PF) 250 MCG/5ML IJ SOLN
INTRAMUSCULAR | Status: DC | PRN
  Administered 2020-11-28 (×2): 50 ug via INTRAVENOUS
  Administered 2020-11-28: 150 ug via INTRAVENOUS

## 2020-11-28 MED ORDER — BUPIVACAINE LIPOSOME 1.3 % IJ SUSP
INTRAMUSCULAR | Status: AC
  Filled 2020-11-28: qty 20

## 2020-11-28 MED ORDER — PROPOFOL 10 MG/ML IV BOLUS
INTRAVENOUS | Status: DC | PRN
  Administered 2020-11-28: 200 mg via INTRAVENOUS

## 2020-11-28 MED ORDER — LIDOCAINE 2% (20 MG/ML) 5 ML SYRINGE
INTRAMUSCULAR | Status: AC
  Filled 2020-11-28: qty 5

## 2020-11-28 MED ORDER — AMISULPRIDE (ANTIEMETIC) 5 MG/2ML IV SOLN
INTRAVENOUS | Status: AC
  Filled 2020-11-28: qty 4

## 2020-11-28 MED ORDER — MIDAZOLAM HCL 2 MG/2ML IJ SOLN
INTRAMUSCULAR | Status: AC
  Filled 2020-11-28: qty 2

## 2020-11-28 MED ORDER — OXYCODONE HCL 5 MG PO TABS
5.0000 mg | ORAL_TABLET | ORAL | Status: DC | PRN
  Administered 2020-11-28 – 2020-11-30 (×6): 10 mg via ORAL
  Filled 2020-11-28 (×6): qty 2

## 2020-11-28 MED ORDER — SUGAMMADEX SODIUM 200 MG/2ML IV SOLN
INTRAVENOUS | Status: DC | PRN
  Administered 2020-11-28: 300 mg via INTRAVENOUS

## 2020-11-28 MED ORDER — LIDOCAINE 2% (20 MG/ML) 5 ML SYRINGE
INTRAMUSCULAR | Status: DC | PRN
  Administered 2020-11-28: 60 mg via INTRAVENOUS

## 2020-11-28 MED ORDER — CEFAZOLIN SODIUM-DEXTROSE 2-4 GM/100ML-% IV SOLN
2.0000 g | Freq: Three times a day (TID) | INTRAVENOUS | Status: AC
  Administered 2020-11-28 (×2): 2 g via INTRAVENOUS
  Filled 2020-11-28 (×2): qty 100

## 2020-11-28 MED ORDER — KETOROLAC TROMETHAMINE 30 MG/ML IJ SOLN
INTRAMUSCULAR | Status: DC | PRN
  Administered 2020-11-28: 30 mg via INTRAVENOUS

## 2020-11-28 MED ORDER — PHENYLEPHRINE HCL-NACL 20-0.9 MG/250ML-% IV SOLN
INTRAVENOUS | Status: DC | PRN
  Administered 2020-11-28: 15 ug/min via INTRAVENOUS

## 2020-11-28 MED ORDER — CHLORHEXIDINE GLUCONATE CLOTH 2 % EX PADS
6.0000 | MEDICATED_PAD | Freq: Every day | CUTANEOUS | Status: DC
  Administered 2020-11-29: 6 via TOPICAL

## 2020-11-28 MED ORDER — BISACODYL 5 MG PO TBEC
10.0000 mg | DELAYED_RELEASE_TABLET | Freq: Every day | ORAL | Status: DC
  Administered 2020-11-29 – 2020-11-30 (×2): 10 mg via ORAL
  Filled 2020-11-28 (×2): qty 2

## 2020-11-28 MED ORDER — AMISULPRIDE (ANTIEMETIC) 5 MG/2ML IV SOLN
10.0000 mg | Freq: Once | INTRAVENOUS | Status: AC | PRN
  Administered 2020-11-28: 10 mg via INTRAVENOUS

## 2020-11-28 MED ORDER — INSULIN ASPART 100 UNIT/ML IJ SOLN
INTRAMUSCULAR | Status: AC
  Filled 2020-11-28: qty 1

## 2020-11-28 MED ORDER — FENTANYL CITRATE (PF) 100 MCG/2ML IJ SOLN
25.0000 ug | INTRAMUSCULAR | Status: DC | PRN
  Administered 2020-11-28: 25 ug via INTRAVENOUS

## 2020-11-28 MED ORDER — ACETAMINOPHEN 500 MG PO TABS
1000.0000 mg | ORAL_TABLET | Freq: Four times a day (QID) | ORAL | Status: DC
  Administered 2020-11-28 – 2020-12-03 (×16): 1000 mg via ORAL
  Filled 2020-11-28 (×17): qty 2

## 2020-11-28 MED ORDER — ACETAMINOPHEN 10 MG/ML IV SOLN
INTRAVENOUS | Status: AC
  Filled 2020-11-28: qty 100

## 2020-11-28 MED ORDER — ATORVASTATIN CALCIUM 10 MG PO TABS
10.0000 mg | ORAL_TABLET | Freq: Every day | ORAL | Status: DC
  Administered 2020-11-28 – 2020-12-02 (×5): 10 mg via ORAL
  Filled 2020-11-28 (×5): qty 1

## 2020-11-28 MED ORDER — PREGABALIN 25 MG PO CAPS
25.0000 mg | ORAL_CAPSULE | Freq: Two times a day (BID) | ORAL | Status: DC
  Administered 2020-12-01 – 2020-12-03 (×4): 25 mg via ORAL
  Filled 2020-11-28 (×4): qty 1

## 2020-11-28 MED ORDER — ONDANSETRON HCL 4 MG/2ML IJ SOLN
4.0000 mg | Freq: Four times a day (QID) | INTRAMUSCULAR | Status: DC | PRN
  Administered 2020-11-29 – 2020-12-02 (×6): 4 mg via INTRAVENOUS
  Filled 2020-11-28 (×6): qty 2

## 2020-11-28 MED ORDER — PREGABALIN 25 MG PO CAPS
25.0000 mg | ORAL_CAPSULE | Freq: Every day | ORAL | Status: AC
  Administered 2020-11-28 – 2020-11-30 (×3): 25 mg via ORAL
  Filled 2020-11-28 (×3): qty 1

## 2020-11-28 MED ORDER — SENNOSIDES-DOCUSATE SODIUM 8.6-50 MG PO TABS
1.0000 | ORAL_TABLET | Freq: Every day | ORAL | Status: DC
  Administered 2020-11-28 – 2020-11-30 (×3): 1 via ORAL
  Filled 2020-11-28 (×3): qty 1

## 2020-11-28 MED ORDER — BUPIVACAINE LIPOSOME 1.3 % IJ SUSP
INTRAMUSCULAR | Status: DC | PRN
  Administered 2020-11-28: 100 mL

## 2020-11-28 MED ORDER — BUPIVACAINE HCL (PF) 0.5 % IJ SOLN
INTRAMUSCULAR | Status: AC
  Filled 2020-11-28: qty 30

## 2020-11-28 MED ORDER — ROCURONIUM BROMIDE 10 MG/ML (PF) SYRINGE
PREFILLED_SYRINGE | INTRAVENOUS | Status: AC
  Filled 2020-11-28: qty 10

## 2020-11-28 SURGICAL SUPPLY — 122 items
ADH SKN CLS APL DERMABOND .7 (GAUZE/BANDAGES/DRESSINGS) ×2
APPLIER CLIP ROT 10 11.4 M/L (STAPLE)
APR CLP MED LRG 11.4X10 (STAPLE)
BAG SPEC RTRVL C125 8X14 (MISCELLANEOUS) ×2
BIT DRILL 7/64X5 DISP (BIT) IMPLANT
BLADE CLIPPER SURG (BLADE) ×1 IMPLANT
BNDG COHESIVE 6X5 TAN STRL LF (GAUZE/BANDAGES/DRESSINGS) IMPLANT
CANISTER SUCT 3000ML PPV (MISCELLANEOUS) ×6 IMPLANT
CANNULA REDUC XI 12-8 STAPL (CANNULA) ×6
CANNULA REDUCER 12-8 DVNC XI (CANNULA) ×4 IMPLANT
CATH THORACIC 28FR (CATHETERS) IMPLANT
CATH THORACIC 28FR RT ANG (CATHETERS) IMPLANT
CATH THORACIC 36FR (CATHETERS) IMPLANT
CATH THORACIC 36FR RT ANG (CATHETERS) IMPLANT
CLIP APPLIE ROT 10 11.4 M/L (STAPLE) IMPLANT
CLIP VESOCCLUDE MED 6/CT (CLIP) ×3 IMPLANT
CNTNR URN SCR LID CUP LEK RST (MISCELLANEOUS) ×14 IMPLANT
CONN ST 1/4X3/8  BEN (MISCELLANEOUS) ×3
CONN ST 1/4X3/8 BEN (MISCELLANEOUS) ×1 IMPLANT
CONT SPEC 4OZ STRL OR WHT (MISCELLANEOUS) ×27
DEFOGGER SCOPE WARMER CLEARIFY (MISCELLANEOUS) ×3 IMPLANT
DERMABOND ADVANCED (GAUZE/BANDAGES/DRESSINGS) ×1
DERMABOND ADVANCED .7 DNX12 (GAUZE/BANDAGES/DRESSINGS) ×2 IMPLANT
DRAIN CHANNEL 28F RND 3/8 FF (WOUND CARE) IMPLANT
DRAIN CHANNEL 32F RND 10.7 FF (WOUND CARE) IMPLANT
DRAPE ARM DVNC X/XI (DISPOSABLE) ×8 IMPLANT
DRAPE COLUMN DVNC XI (DISPOSABLE) ×2 IMPLANT
DRAPE CV SPLIT W-CLR ANES SCRN (DRAPES) ×3 IMPLANT
DRAPE DA VINCI XI ARM (DISPOSABLE) ×12
DRAPE DA VINCI XI COLUMN (DISPOSABLE) ×3
DRAPE HALF SHEET 40X57 (DRAPES) ×3 IMPLANT
DRAPE INCISE IOBAN 66X45 STRL (DRAPES) IMPLANT
DRAPE ORTHO SPLIT 77X108 STRL (DRAPES) ×3
DRAPE SURG ORHT 6 SPLT 77X108 (DRAPES) ×2 IMPLANT
ELECT BLADE 6.5 EXT (BLADE) ×3 IMPLANT
ELECT REM PT RETURN 9FT ADLT (ELECTROSURGICAL) ×3
ELECTRODE REM PT RTRN 9FT ADLT (ELECTROSURGICAL) ×2 IMPLANT
FELT TEFLON 1X6 (MISCELLANEOUS) ×3 IMPLANT
GAUZE KITTNER 4X5 RF (MISCELLANEOUS) ×6 IMPLANT
GAUZE SPONGE 4X4 12PLY STRL (GAUZE/BANDAGES/DRESSINGS) ×3 IMPLANT
GLOVE SURG MICRO LTX SZ7.5 (GLOVE) ×6 IMPLANT
GLOVE SURG SIGNA 7.5 PF LTX (GLOVE) ×9 IMPLANT
GOWN STRL REUS W/ TWL LRG LVL3 (GOWN DISPOSABLE) ×4 IMPLANT
GOWN STRL REUS W/ TWL XL LVL3 (GOWN DISPOSABLE) ×4 IMPLANT
GOWN STRL REUS W/TWL 2XL LVL3 (GOWN DISPOSABLE) ×3 IMPLANT
GOWN STRL REUS W/TWL LRG LVL3 (GOWN DISPOSABLE) ×6
GOWN STRL REUS W/TWL XL LVL3 (GOWN DISPOSABLE) ×6
HEMOSTAT SURGICEL 2X14 (HEMOSTASIS) ×7 IMPLANT
INSERT FOGARTY 61MM (MISCELLANEOUS) IMPLANT
IRRIGATION STRYKERFLOW (MISCELLANEOUS) ×2 IMPLANT
IRRIGATOR STRYKERFLOW (MISCELLANEOUS) ×3
KIT BASIN OR (CUSTOM PROCEDURE TRAY) ×6 IMPLANT
KIT SUCTION CATH 14FR (SUCTIONS) ×3 IMPLANT
KIT TURNOVER KIT B (KITS) ×3 IMPLANT
NDL HYPO 25GX1X1/2 BEV (NEEDLE) ×1 IMPLANT
NDL SPNL 22GX3.5 QUINCKE BK (NEEDLE) ×1 IMPLANT
NEEDLE HYPO 25GX1X1/2 BEV (NEEDLE) ×3 IMPLANT
NEEDLE SPNL 22GX3.5 QUINCKE BK (NEEDLE) ×3 IMPLANT
NS IRRIG 1000ML POUR BTL (IV SOLUTION) ×9 IMPLANT
PACK CHEST (CUSTOM PROCEDURE TRAY) ×3 IMPLANT
PAD ARMBOARD 7.5X6 YLW CONV (MISCELLANEOUS) ×6 IMPLANT
PASSER SUT SWANSON 36MM LOOP (INSTRUMENTS) IMPLANT
PORT ACCESS TROCAR AIRSEAL 12 (TROCAR) ×2 IMPLANT
PORT ACCESS TROCAR AIRSEAL 5M (TROCAR) ×1
SCISSORS LAP 5X35 DISP (ENDOMECHANICALS) IMPLANT
SEAL CANN UNIV 5-8 DVNC XI (MISCELLANEOUS) ×4 IMPLANT
SEAL XI 5MM-8MM UNIVERSAL (MISCELLANEOUS) ×6
SEALANT PROGEL (MISCELLANEOUS) IMPLANT
SEALANT SURG COSEAL 8ML (VASCULAR PRODUCTS) IMPLANT
SET TRI-LUMEN FLTR TB AIRSEAL (TUBING) ×5 IMPLANT
SOLUTION ELECTROLUBE (MISCELLANEOUS) ×3 IMPLANT
SPONGE INTESTINAL PEANUT (DISPOSABLE) IMPLANT
SPONGE T-LAP 18X18 ~~LOC~~+RFID (SPONGE) ×4 IMPLANT
SPONGE TONSIL TAPE 1 RFD (DISPOSABLE) ×3 IMPLANT
STAPLER CANNULA SEAL DVNC XI (STAPLE) ×4 IMPLANT
STAPLER CANNULA SEAL XI (STAPLE) ×6
STAPLER VISISTAT 35W (STAPLE) IMPLANT
STOPCOCK 4 WAY LG BORE MALE ST (IV SETS) ×3 IMPLANT
SUT PDS AB 3-0 SH 27 (SUTURE) IMPLANT
SUT PROLENE 2 0 MH 48 (SUTURE) IMPLANT
SUT PROLENE 2 0 SH DA (SUTURE) IMPLANT
SUT PROLENE 3 0 SH 48 (SUTURE) IMPLANT
SUT PROLENE 4 0 RB 1 (SUTURE) ×3
SUT PROLENE 4 0 SH DA (SUTURE) IMPLANT
SUT PROLENE 4-0 RB1 .5 CRCL 36 (SUTURE) ×2 IMPLANT
SUT SILK  1 MH (SUTURE) ×6
SUT SILK 1 MH (SUTURE) ×4 IMPLANT
SUT SILK 1 TIES 10X30 (SUTURE) IMPLANT
SUT SILK 2 0 SH (SUTURE) ×4 IMPLANT
SUT SILK 2 0SH CR/8 30 (SUTURE) ×1 IMPLANT
SUT SILK 3 0 TIES 10X30 (SUTURE) ×2 IMPLANT
SUT SILK 3 0SH CR/8 30 (SUTURE) IMPLANT
SUT VIC AB 1 CTX 18 (SUTURE) IMPLANT
SUT VIC AB 1 CTX 36 (SUTURE) ×18
SUT VIC AB 1 CTX36XBRD ANBCTR (SUTURE) ×7 IMPLANT
SUT VIC AB 2-0 CT1 27 (SUTURE) ×3
SUT VIC AB 2-0 CT1 TAPERPNT 27 (SUTURE) ×1 IMPLANT
SUT VIC AB 2-0 CTX 36 (SUTURE) ×4 IMPLANT
SUT VIC AB 3-0 MH 27 (SUTURE) IMPLANT
SUT VIC AB 3-0 X1 27 (SUTURE) ×10 IMPLANT
SUT VICRYL 0 TIES 12 18 (SUTURE) ×3 IMPLANT
SUT VICRYL 0 UR6 27IN ABS (SUTURE) ×6 IMPLANT
SUT VICRYL 2 TP 1 (SUTURE) ×3 IMPLANT
SWAB CULTURE ESWAB REG 1ML (MISCELLANEOUS) IMPLANT
SYR 10ML LL (SYRINGE) ×3 IMPLANT
SYR 20ML LL LF (SYRINGE) ×6 IMPLANT
SYR 50ML LL SCALE MARK (SYRINGE) ×3 IMPLANT
SYSTEM RETRIEVAL ANCHOR 8 (MISCELLANEOUS) ×2 IMPLANT
SYSTEM SAHARA CHEST DRAIN ATS (WOUND CARE) ×3 IMPLANT
TAPE CLOTH 4X10 WHT NS (GAUZE/BANDAGES/DRESSINGS) ×3 IMPLANT
TAPE CLOTH SURG 4X10 WHT LF (GAUZE/BANDAGES/DRESSINGS) ×2 IMPLANT
TIP APPLICATOR SPRAY EXTEND 16 (VASCULAR PRODUCTS) IMPLANT
TOWEL GREEN STERILE (TOWEL DISPOSABLE) ×6 IMPLANT
TRAY FOLEY MTR SLVR 16FR STAT (SET/KITS/TRAYS/PACK) ×1 IMPLANT
TRAY FOLEY SLVR 16FR LF STAT (SET/KITS/TRAYS/PACK) ×1 IMPLANT
TRAY FOLEY W/BAG SLVR 14FR LF (SET/KITS/TRAYS/PACK) ×2 IMPLANT
TROCAR BLADELESS 15MM (ENDOMECHANICALS) IMPLANT
TROCAR PORT AIRSEAL 8X120 (TROCAR) ×2 IMPLANT
TROCAR XCEL 12X100 BLDLESS (ENDOMECHANICALS) IMPLANT
TROCAR XCEL BLADELESS 5X75MML (TROCAR) IMPLANT
TUBING EXTENTION W/L.L. (IV SETS) IMPLANT
WATER STERILE IRR 1000ML POUR (IV SOLUTION) ×3 IMPLANT

## 2020-11-28 NOTE — Anesthesia Procedure Notes (Signed)
Procedure Name: Intubation Date/Time: 11/28/2020 8:18 AM Performed by: Trinna Post., CRNA Pre-anesthesia Checklist: Patient identified, Emergency Drugs available, Suction available, Patient being monitored and Timeout performed Patient Re-evaluated:Patient Re-evaluated prior to induction Oxygen Delivery Method: Circle system utilized Preoxygenation: Pre-oxygenation with 100% oxygen Induction Type: IV induction Ventilation: Mask ventilation without difficulty and Oral airway inserted - appropriate to patient size Laryngoscope Size: Mac and 3 Grade View: Grade I Endobronchial tube: Left, Double lumen EBT, EBT position confirmed by auscultation and EBT position confirmed by fiberoptic bronchoscope and 37 Fr Number of attempts: 1 Airway Equipment and Method: Rigid stylet Placement Confirmation: ETT inserted through vocal cords under direct vision, positive ETCO2 and breath sounds checked- equal and bilateral Tube secured with: Tape Dental Injury: Teeth and Oropharynx as per pre-operative assessment

## 2020-11-28 NOTE — Discharge Instructions (Signed)
Thoracotomy, Care After This sheet gives you information about how to care for yourself after your procedure. Your doctor may also give you more specific instructions. If you have problems or questions, contact your doctor. What can I expect after the procedure? After the procedure, it is common to have: Redness, pain, and swelling around the cut from surgery (incision). Fluid or blood coming from the cut from surgery. Pain when you breathe in. Trouble pooping (constipation). Being tired. Lack of interest in eating and drinking. Trouble sleeping. Follow these instructions at home: Preventing lung infection Take deep breaths or do breathing exercises to prevent lung infection (pneumonia) as told by your doctor. Cough often. Coughing helps to clear mucus and open your lungs. If coughing hurts, hold a pillow against your chest or place both hands flat on top of your cut when you cough. This may help with discomfort. Use an incentive spirometer as told by your doctor. This is a tool that measures how well you fill your lungs with each breath. Do lung therapy (pulmonary rehabilitation) as told by your doctor. Medicines Take over-the-counter and prescription medicines only as told by your doctor. If you have pain, take medicine to help the pain before it gets very bad. This will help you breathe and cough more easily. If you were prescribed an antibiotic medicine, take it as told by your doctor. Do not stop taking the antibiotic even if you start to feel better. Ask your doctor if the medicine prescribed to you: Requires you to avoid driving or using machinery. Can cause trouble pooping. You may need to take these actions to prevent or treat trouble pooping: Drink enough fluid to keep your pee (urine) pale yellow. Take over-the-counter or prescription medicines. Eat foods that are high in fiber. These include beans, whole grains, and fresh fruits and vegetables. Limit foods that are high in fat  and sugar. These include fried or sweet foods. Activity Rest as told by your doctor. Ask your doctor what activities are safe for you. Do not travel by airplane for 2 weeks after your chest tube is removed, or until your doctor says that this is safe. Do not lift anything that is heavier than 10 lb (4.5 kg), or the limit that you are told, until your doctor says that it is safe. Incision care Follow instructions from your doctor about how to take care of your cut from surgery. Make sure you: Wash your hands with soap and water for at least 20 seconds before and after you change your bandage (dressing). If you cannot use soap and water, use hand sanitizer. Change your bandage as told by your doctor. Leave stitches (sutures), skin glue, or skin tape (adhesive) strips in place. They may need to stay in place for 2 weeks or longer. If tape strips get loose and curl up, you may trim the loose edges. Do not remove tape strips completely unless your doctor says it is okay. Keep your bandage dry. Check the area around your cut from surgery every day for signs of infection. Check for: More redness, swelling, or pain. More fluid or blood. Warmth. Pus or a bad smell. After your bandage has been taken off, use soap and water to gently wash your cut from surgery. Do not use anything else to clean your cut unless your doctor tells you to. Lifestyle Do not take baths, swim, or use a hot tub until your doctor approves. Ask your doctor if you may take showers. You may only be  allowed to take sponge baths. Do not use any products that contain nicotine or tobacco, such as cigarettes, e-cigarettes, and chewing tobacco. If you need help quitting, ask your doctor. Avoid secondhand smoke. Eat a healthy diet as told by your doctor. A healthy diet includes: Fresh fruits and vegetables. Whole grains. Low-fat (lean) proteins. General instructions Wear compression stockings as told by your doctor. If you have a chest  tube, care for it as told by your doctor. Keep all follow-up visits as told by your doctor. This is important. Contact a doctor if: You have any signs of infection around your cut from surgery, such as: More redness, swelling, or pain. More fluid or blood. Warmth. Pus or a bad smell. You have a fever or chills. Your heartbeat seems uneven. You vomit or feel like vomiting. You have pain in your muscles or you feel weak. You feel like you are about to faint. You have very bad pain or pain that gets worse even when you take medicine. Get help right away if: You get a rash. You have trouble breathing. You develop signs of a blood clot, such as: You have trouble talking. You are confused. You cannot see well. You are not able to move. You lose feeling in your face, arms, or legs (feel numb). You faint. You have a very bad headache all of a sudden. You have chest pain. These symptoms may be an emergency. Do not wait to see if the symptoms will go away. Get medical help right away. Call your local emergency services (911 in the [place] Do not drive yourself to the hospital. Summary After the procedure, it is common to have pain, redness, fluid, or blood around the area that was cut. Take deep breaths, do breathing exercises, and cough often. This helps prevent lung infection. Do not travel by airplane for 2 weeks after your chest tube is removed, or until your doctor says that this is safe. Check the area around your cut from surgery every day for signs of infection. Eat a healthy diet. This includes fresh fruits and vegetables, whole grains, and low-fat (lean) proteins. This information is not intended to replace advice given to you by your health care provider. Make sure you discuss any questions you have with your health care provider. Document Revised: 10/18/2018 Document Reviewed: 10/18/2018 Elsevier Patient Education  [employer].

## 2020-11-28 NOTE — Progress Notes (Signed)
Patient notified of CBG 295, patient set insulin pump at 100% (2.8 u/hr)

## 2020-11-28 NOTE — Brief Op Note (Signed)
11/28/2020  11:30 AM  PATIENT:  Barbara Clark  45 y.o. female  PRE-OPERATIVE DIAGNOSIS:  Mass OF CHEST WALL  POST-OPERATIVE DIAGNOSIS:  FATTY TUMOR OF CHEST WALL  PROCEDURE:  XI ROBOTIC ASSISTED RESECTION OF CHEST WALL MASS, RIGHT THORACOTOMY for REMOVAL of RIGHT CHEST WALL MASS, RIGHT INTERCOSTAL NERVE BLOCK   SURGEON:  Surgeon(s) and Role:    [name], MD - Primary  PHYSICIAN ASSISTANT: Lars Pinks PA-C  ANESTHESIA:   general  EBL:  10 mL   BLOOD ADMINISTERED:none  DRAINS:  28 Blake drain placed in the right pleural space    LOCAL MEDICATIONS USED:  OTHER Exparel  SPECIMEN:  Source of Specimen:  Fatty tumor of chest wall . Posterior margin negative for tumor  DISPOSITION OF SPECIMEN:  PATHOLOGY  COUNTS CORRECT:  YES  DICTATION: .Dragon Dictation  PLAN OF CARE: Admit to inpatient   PATIENT DISPOSITION:  PACU - hemodynamically stable.   Delay start of Pharmacological VTE agent (>24hrs) due to surgical blood loss or risk of bleeding: no

## 2020-11-28 NOTE — Interval H&P Note (Signed)
History and Physical Interval Note:   There has been no interval change in exam since I saw her in June 11/28/2020 7:57 AM  Barbara Clark  has presented today for surgery, with the diagnosis of FATTY TUMOR OF CHEST WALL.  The various methods of treatment have been discussed with the patient and family. After consideration of risks, benefits and other options for treatment, the patient has consented to  Procedure(s): XI ROBOTIC ASSISTED RESECTION OF LIPOMA, possible thoracotomy (Right) possible THORACOTOMY (Right) as a surgical intervention.  The patient's history has been reviewed, patient examined, no change in status, stable for surgery.  I have reviewed the patient's chart and labs.  Questions were answered to the patient's satisfaction.     [name]

## 2020-11-28 NOTE — Transfer of Care (Signed)
Immediate Anesthesia Transfer of Care Note  Patient: Barbara Clark  Procedure(s) Performed: XI ROBOTIC ASSISTED RESECTION OF CHEST WALL MASS (Right: Chest) INTERCOSTAL NERVE BLOCK (Right: Chest) LYMPH NODE DISSECTION (Right: Chest)  Patient Location: PACU  Anesthesia Type:General  Level of Consciousness: drowsy  Airway & Oxygen Therapy: Patient Spontanous Breathing and Patient connected to nasal cannula oxygen  Post-op Assessment: Report given to RN and Post -op Vital signs reviewed and stable  Post vital signs: Reviewed and stable  Last Vitals:  Vitals Value Taken Time  BP 123/68 11/28/20 1202  Temp    Pulse 78 11/28/20 1203  Resp 50 11/28/20 1203  SpO2 100 % 11/28/20 1203  Vitals shown include unvalidated device data.  Last Pain:  Vitals:   11/28/20 0627  TempSrc: Oral         Complications: No notable events documented.

## 2020-11-28 NOTE — Progress Notes (Addendum)
Inpatient Diabetes Program Recommendations  AACE/ADA: New Consensus Statement on Inpatient Glycemic Control (2015)  Target Ranges:  Prepandial:   less than 140 mg/dL      Peak postprandial:   less than 180 mg/dL (1-2 hours)      Critically ill patients:  140 - 180 mg/dL   Lab Results  Component Value Date   ZDGLOV 564 (H) 11/28/2020   HGBA1C 7.9 11/27/2019    Review of Glycemic Control  Diabetes history: DM1 (requires basal, correction & also meal coverage when eating) Outpatient Diabetes medications: Insulin pump Current orders for Inpatient glycemic control: Novolog 0-24 units correction q 6 hrs. And now insulin pump restarted  Inpatient Diabetes Program Recommendations:   Patient is currently in PACU post op. Noted patient has type 1 diabetes and is on insulin pump which is currently off.  Spoke with patient @ bedside after transferred to Room 2C   RN notified Diabetes coordinator that patient restarted on insulin pump @ home settings of 2.8 units/hr. Patient is alert and oriented x 3 and assessed to be able to safely manage her insulin pump. Awaiting order for insulin pump order set. RN Cresc[place] or PACU RN is contacting [facility] for insulin pump orders.  Dr. Buddy Duty is her endocrinologist. Patient states she had office visit with Dr. Buddy Duty Monday November 7th and is aware of surgery planned for today. Current settings for insulin pump: Basal insulin rate 2.8 units/hr Carbohydrate ratio 1 unit/2.5 mg carbohydrate Sensitivity factor 1 unit drops blood glucose 22 per dl Target = 110-130   Thank you, Nani Gasser. Caniya Tagle, RN, MSN, CDE  Diabetes Coordinator Inpatient Glycemic Control Team Team Pager [phone] (8am-5pm) 11/28/2020 3:06 PM
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
## 2020-11-28 NOTE — Anesthesia Preprocedure Evaluation (Addendum)
Anesthesia Evaluation  Patient identified by MRN, date of birth, ID band Patient awake    Reviewed: Allergy & Precautions, NPO status , Patient's Chart, lab work & pertinent test results  History of Anesthesia Complications (+) PONV  Airway Mallampati: II  TM Distance: >3 FB Neck ROM: Full    Dental  (+) Dental Advisory Given   Pulmonary sleep apnea , former smoker,    breath sounds clear to auscultation       Cardiovascular hypertension, Pt. on medications  Rhythm:Regular Rate:Normal     Neuro/Psych  Neuromuscular disease    GI/Hepatic negative GI ROS, Neg liver ROS,   Endo/Other  diabetes, Type 2  Renal/GU negative Renal ROS     Musculoskeletal   Abdominal   Peds  Hematology  (+) anemia ,   Anesthesia Other Findings   Reproductive/Obstetrics                             Lab Results  Component Value Date   WBC 9.3 11/26/2020   HGB 11.7 (L) 11/26/2020   HCT 37.0 11/26/2020   MCV 94.1 11/26/2020   PLT 404 (H) 11/26/2020   Lab Results  Component Value Date   CREATININE 0.71 11/26/2020   BUN 12 11/26/2020   NA 135 11/26/2020   K 4.2 11/26/2020   CL 103 11/26/2020   CO2 23 11/26/2020    Anesthesia Physical Anesthesia Plan  ASA: 2  Anesthesia Plan: General   Post-op Pain Management:    Induction: Intravenous  PONV Risk Score and Plan: 4 or greater and Scopolamine patch - Pre-op, Midazolam, Dexamethasone, Ondansetron and Treatment may vary due to age or medical condition  Airway Management Planned: Oral ETT and Double Lumen EBT  Additional Equipment:   Intra-op Plan:   Post-operative Plan: Extubation in OR  Informed Consent: I have reviewed the patients History and Physical, chart, labs and discussed the procedure including the risks, benefits and alternatives for the proposed anesthesia with the patient or authorized representative who has indicated his/her  understanding and acceptance.     Dental advisory given  Plan Discussed with: CRNA  Anesthesia Plan Comments: (Clearsight. 2 IV's GETA.)        Anesthesia Quick Evaluation
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
## 2020-11-28 NOTE — TOC Initial Note (Signed)
Transition of Care [facility]) - Initial/Assessment Note    Patient Details  Name: Barbara Clark MRN: [id number] Date of Birth: [birth year]  [facility]) CM/SW Contact:    Verdell Carmine, RN Phone Number: 11/28/2020, 5:03 PM  Clinical Narrative:                 Patient admitted for surgery dissection and removal of chest mass. Has blake tube in, Likely a lipoma. Patient lives at home with teens and one adult.  Is IDDM< and has a insulin pump . Independent at home.  CM will follow for needs, recommendations and transitions.  Expected Discharge Plan: Home/Self Care Barriers to Discharge: Continued Medical Work up   Patient Goals and CMS Choice        Expected Discharge Plan and Services Expected Discharge Plan: Home/Self Care       Living arrangements for the past 2 months: Single Family Home                                      Prior Living Arrangements/Services Living arrangements for the past 2 months: Single Family Home Lives with:: Minor Children Patient language and need for interpreter reviewed:: Yes        Need for Family Participation in Patient Care: Yes (Comment) Care giver support system in place?: Yes (comment)   Criminal Activity/Legal Involvement Pertinent to Current Situation/Hospitalization: No - Comment as needed  Activities of Daily Living      Permission Sought/Granted                  Emotional Assessment       Orientation: : Oriented to Self, Oriented to Place, Oriented to  Time, Oriented to Situation Alcohol / Substance Use: Not Applicable Psych Involvement: No (comment)  Admission diagnosis:  Mass of right chest wall [R22.2] Patient Active Problem List   Diagnosis Date Noted   Mass of right chest wall 11/28/2020   Urinary frequency 07/15/2020   Cellulitis 06/11/2020   Right rotator cuff tear 05/10/2020   Pleural mass 05/10/2020   Encounter for administration of COVID-19 vaccine 03/15/2020   Chronic cough  12/22/2019   Cataract 09/26/2019   De Quervain's tenosynovitis, bilateral 12/20/2018   Carpal tunnel syndrome on right 11/01/2018   Snoring 08/02/2018   Obesity (BMI 30-39.9) 10/12/2017   Back pain, lumbosacral 07/06/2017   Vaginitis 03/27/2016   Right shoulder pain 05/07/2015   Psychologic conversion disorder, 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   DM neuropathy, type I diabetes mellitus ([place]) 03/13/2014   Diabetes mellitus type I ([place]) 03/18/2006   Major depressive disorder, recurrent episode ([place]) 03/18/2006   PCP:  [name], MD Pharmacy:   [place], [place] - [address] [place] [address] Phone: [phone] Fax: [phone]  [id number] - [place], [place] [address] Phone: [phone] Fax: [phone]     Social Determinants of Health ([place]) Interventions    Readmission Risk Interventions No flowsheet data found.

## 2020-11-29 ENCOUNTER — Encounter (HOSPITAL_COMMUNITY): Payer: Self-pay | Admitting: Thoracic Surgery (Cardiothoracic Vascular Surgery)

## 2020-11-29 LAB — BASIC METABOLIC PANEL
Anion gap: 8 (ref 5–15)
BUN: 9 mg/dL (ref 6–20)
CO2: 23 mmol/L (ref 22–32)
Calcium: 8 mg/dL — ABNORMAL LOW (ref 8.9–10.3)
Chloride: 103 mmol/L (ref 98–111)
Creatinine, Ser: 0.82 mg/dL (ref 0.44–1.00)
GFR, Estimated: >60 mL/min (ref >60)
Glucose, Bld: 153 mg/dL — ABNORMAL HIGH (ref 70–99)
Potassium: 4 mmol/L (ref 3.5–5.1)
Sodium: 134 mmol/L — ABNORMAL LOW (ref 135–145)

## 2020-11-29 LAB — CBC
HCT: 32.5 % — ABNORMAL LOW (ref 36.0–46.0)
Hemoglobin: 10.5 g/dL — ABNORMAL LOW (ref 12.0–15.0)
MCH: 29.7 pg (ref 26.0–34.0)
MCHC: 32.3 g/dL (ref 30.0–36.0)
MCV: 92.1 fL (ref 80.0–100.0)
Platelets: 397 10*3/uL (ref 150–400)
RBC: 3.53 MIL/uL — ABNORMAL LOW (ref 3.87–5.11)
RDW: 12.1 % (ref 11.5–15.5)
WBC: 14.5 10*3/uL — ABNORMAL HIGH (ref 4.0–10.5)
nRBC: 0 % (ref 0.0–0.2)

## 2020-11-29 LAB — GLUCOSE, CAPILLARY
Glucose-Capillary: 153 mg/dL — ABNORMAL HIGH (ref 70–99)
Glucose-Capillary: 136 mg/dL — ABNORMAL HIGH (ref 70–99)
Glucose-Capillary: 106 mg/dL — ABNORMAL HIGH (ref 70–99)
Glucose-Capillary: 88 mg/dL (ref 70–99)
Glucose-Capillary: 144 mg/dL — ABNORMAL HIGH (ref 70–99)
Glucose-Capillary: 147 mg/dL — ABNORMAL HIGH (ref 70–99)

## 2020-11-29 LAB — SURGICAL PATHOLOGY

## 2020-11-29 NOTE — Anesthesia Postprocedure Evaluation (Signed)
Anesthesia Post Note  Patient: Barbara Clark  Procedure(s) Performed: XI ROBOTIC ASSISTED RESECTION OF CHEST WALL MASS (Right: Chest) INTERCOSTAL NERVE BLOCK (Right: Chest)     Patient location during evaluation: PACU Anesthesia Type: General Level of consciousness: awake and alert Pain management: pain level controlled Vital Signs Assessment: post-procedure vital signs reviewed and stable Respiratory status: spontaneous breathing, nonlabored ventilation, respiratory function stable and patient connected to nasal cannula oxygen Cardiovascular status: blood pressure returned to baseline and stable Postop Assessment: no apparent nausea or vomiting Anesthetic complications: no   No notable events documented.  Last Vitals:  Vitals:   11/29/20 0324 11/29/20 0742  BP: 119/62 121/71  Pulse: 94 85  Resp: 20 16  Temp: 37.1 C 37 C  SpO2: 95% 93%    Last Pain:  Vitals:   11/29/20 0742  TempSrc: Oral  PainSc: 0-No pain                 Tiajuana Amass

## 2020-11-29 NOTE — Progress Notes (Addendum)
      [address]       [place] [address]             [phone]      1 Day Post-Op Procedure(s) (LRB): XI ROBOTIC ASSISTED RESECTION OF CHEST WALL MASS (Right) INTERCOSTAL NERVE BLOCK (Right) Subjective: Awake and alert.  Says she has had some nausea and pain in her chest but she is sitting up finishing breakfast now.  She is now utilizing her insulin pump.  Her glucose before eating breakfast was 106.  Objective: Vital signs in last 24 hours: Temp:  [97.6 F (36.4 C)-99.8 F (37.7 C)] 98.7 F (37.1 C) (11/11 0324) Pulse Rate:  [76-107] 94 (11/11 0324) Cardiac Rhythm: Normal sinus rhythm (11/11 0701) Resp:  [15-27] 20 (11/11 0324) BP: (114-155)/(60-93) 119/62 (11/11 0324) SpO2:  [94 %-100 %] 95 % (11/11 0324) FiO2 (%):  [21 %] 21 % (11/10 1151)     Intake/Output from previous day: 11/10 0701 - 11/11 0700 In: 3006.7 [P.O.:480; I.V.:2343.3; IV Piggyback:183.3] Out: 3680 [Urine:3340; Blood:10; Chest Tube:330] Intake/Output this shift: No intake/output data recorded.  General appearance: alert, cooperative, and mild distress Neurologic: intact Heart: regular rate and rhythm Lungs: clear to auscultation bilaterally Wound: Has some serosanguineous drainage from around the chest tube.  Thoracotomy incision is intact and dry.  There is a small air leak.  The chest tube is draining 330 mL since surgery.  Chest x-ray is pending  Lab Results: Recent Labs    11/26/20 1430 11/28/20 1014 11/29/20 0058  WBC 9.3  --  14.5*  HGB 11.7* 11.6* 10.5*  HCT 37.0 34.0* 32.5*  PLT 404*  --  397   BMET:  Recent Labs    11/26/20 1430 11/28/20 1014 11/29/20 0058  NA 135 137 134*  K 4.2 4.4 4.0  CL 103 103 103  CO2 23  --  23  GLUCOSE 211* 158* 153*  BUN 12 9 9   CREATININE 0.71 0.50 0.82  CALCIUM 8.6*  --  8.0*    PT/INR:  Recent Labs    11/26/20 1430  LABPROT 15.3*  INR 1.2   ABG    Component Value Date/Time   PHART 7.399 11/26/2020 1454    HCO3 23.1 11/26/2020 1454   TCO2 25 11/28/2020 1014   ACIDBASEDEF 1.0 11/26/2020 1454   O2SAT 97.3 11/26/2020 1454   CBG (last 3)  Recent Labs    11/29/20 0156 11/29/20 0617 11/29/20 0719  GLUCAP 153* 136* 106*    Assessment/Plan: S/P Procedure(s) (LRB): XI ROBOTIC ASSISTED RESECTION OF CHEST WALL MASS (Right) INTERCOSTAL NERVE BLOCK (Right)   -Postop day 1 right thoracotomy for resection of tumor suspected to be a lipoma.  Respiratory status is stable.  Small air leak we will leave the chest tube on waterseal for now pending findings of this morning's chest x-ray which has not yet been completed.  -Endo-type 1 diabetes mellitus managed with an insulin pump prior to admission.  This has been resumed and glucose is well controlled.  -DVT PPX- Starting enoxaparin today.  -History of depression   LOS: 1 day    Malon Kindle [phone]  11/29/2020 Patient seen and examined, agree with above No air leak when I examined her- will dc chest tube Mobilize  Shuronda Santino C. Roxan Hockey, MD [facility] [phone]

## 2020-11-29 NOTE — Plan of Care (Signed)

## 2020-11-29 NOTE — Plan of Care (Signed)
  Problem: Nutrition: Goal: Adequate nutrition will be maintained Outcome: Not Progressing Note: Pt states that she does not have an appetite. Will continue to assess nutritional status.

## 2020-11-29 NOTE — Plan of Care (Signed)
  Problem: Education: Goal: Knowledge of General Education information will improve Description: Including pain rating scale, medication(s)/side effects and non-pharmacologic comfort measures Outcome: Progressing   Problem: Health Behavior/Discharge Planning: Goal: Ability to manage health-related needs will improve Outcome: Progressing   Problem: Clinical Measurements: Goal: Ability to maintain clinical measurements within normal limits will improve Outcome: Progressing Goal: Will remain free from infection Outcome: Progressing Goal: Diagnostic test results will improve Outcome: Progressing Goal: Respiratory complications will improve Outcome: Progressing Goal: Cardiovascular complication will be avoided Outcome: Progressing   Problem: Activity: Goal: Risk for activity intolerance will decrease Outcome: Progressing   Problem: Coping: Goal: Level of anxiety will decrease Outcome: Progressing   Problem: Elimination: Goal: Will not experience complications related to bowel motility Outcome: Progressing Goal: Will not experience complications related to urinary retention Outcome: Progressing   Problem: Pain Managment: Goal: General experience of comfort will improve Outcome: Progressing   Problem: Safety: Goal: Ability to remain free from injury will improve Outcome: Progressing   Problem: Skin Integrity: Goal: Risk for impaired skin integrity will decrease Outcome: Progressing   Problem: Nutrition: Goal: Adequate nutrition will be maintained Outcome: Not Progressing Note: Pt states that she does not have an appetite. Will continue to assess nutritional status.

## 2020-11-30 ENCOUNTER — Inpatient Hospital Stay (HOSPITAL_COMMUNITY): Payer: Medicaid Other

## 2020-11-30 LAB — COMPREHENSIVE METABOLIC PANEL
ALT: 16 U/L (ref 0–44)
AST: 26 U/L (ref 15–41)
Albumin: 2.4 g/dL — ABNORMAL LOW (ref 3.5–5.0)
Alkaline Phosphatase: 88 U/L (ref 38–126)
Anion gap: 7 (ref 5–15)
BUN: 11 mg/dL (ref 6–20)
CO2: 23 mmol/L (ref 22–32)
Calcium: 7.9 mg/dL — ABNORMAL LOW (ref 8.9–10.3)
Chloride: 103 mmol/L (ref 98–111)
Creatinine, Ser: 0.77 mg/dL (ref 0.44–1.00)
GFR, Estimated: >60 mL/min (ref >60)
Glucose, Bld: 135 mg/dL — ABNORMAL HIGH (ref 70–99)
Potassium: 3.8 mmol/L (ref 3.5–5.1)
Sodium: 133 mmol/L — ABNORMAL LOW (ref 135–145)
Total Bilirubin: 0.2 mg/dL — ABNORMAL LOW (ref 0.3–1.2)
Total Protein: 5.5 g/dL — ABNORMAL LOW (ref 6.5–8.1)

## 2020-11-30 LAB — CBC
HCT: 30.6 % — ABNORMAL LOW (ref 36.0–46.0)
Hemoglobin: 10 g/dL — ABNORMAL LOW (ref 12.0–15.0)
MCH: 30.7 pg (ref 26.0–34.0)
MCHC: 32.7 g/dL (ref 30.0–36.0)
MCV: 93.9 fL (ref 80.0–100.0)
Platelets: 331 10*3/uL (ref 150–400)
RBC: 3.26 MIL/uL — ABNORMAL LOW (ref 3.87–5.11)
RDW: 12.4 % (ref 11.5–15.5)
WBC: 14.5 10*3/uL — ABNORMAL HIGH (ref 4.0–10.5)
nRBC: 0 % (ref 0.0–0.2)

## 2020-11-30 LAB — GLUCOSE, CAPILLARY
Glucose-Capillary: 115 mg/dL — ABNORMAL HIGH (ref 70–99)
Glucose-Capillary: 105 mg/dL — ABNORMAL HIGH (ref 70–99)
Glucose-Capillary: 45 mg/dL — ABNORMAL LOW (ref 70–99)
Glucose-Capillary: 55 mg/dL — ABNORMAL LOW (ref 70–99)
Glucose-Capillary: 141 mg/dL — ABNORMAL HIGH (ref 70–99)
Glucose-Capillary: 96 mg/dL (ref 70–99)
Glucose-Capillary: 141 mg/dL — ABNORMAL HIGH (ref 70–99)
Glucose-Capillary: 164 mg/dL — ABNORMAL HIGH (ref 70–99)

## 2020-11-30 MED ORDER — DEXTROSE 50 % IV SOLN
INTRAVENOUS | Status: AC
  Administered 2020-11-30: 25 mL
  Filled 2020-11-30: qty 50

## 2020-11-30 MED ORDER — LACTULOSE 10 GM/15ML PO SOLN
20.0000 g | Freq: Every day | ORAL | Status: DC | PRN
  Administered 2020-11-30: 20 g via ORAL
  Filled 2020-11-30: qty 30

## 2020-11-30 NOTE — Progress Notes (Signed)
Hypoglycemic episode  Cbg read 45 upon assessment pt nauseated  MD notified  D5w given via IVP  Cbg rechecked in 30 minutes reads 144

## 2020-11-30 NOTE — Plan of Care (Signed)
  Problem: Education: Goal: Knowledge of General Education information will improve Description: Including pain rating scale, medication(s)/side effects and non-pharmacologic comfort measures Outcome: Progressing   Problem: Health Behavior/Discharge Planning: Goal: Ability to manage health-related needs will improve Outcome: Progressing   Problem: Clinical Measurements: Goal: Ability to maintain clinical measurements within normal limits will improve Outcome: Progressing Goal: Will remain free from infection Outcome: Progressing Goal: Diagnostic test results will improve Outcome: Progressing Goal: Respiratory complications will improve Outcome: Progressing Goal: Cardiovascular complication will be avoided Outcome: Progressing   Problem: Nutrition: Goal: Adequate nutrition will be maintained Outcome: Progressing   Problem: Coping: Goal: Level of anxiety will decrease Outcome: Progressing   Problem: Pain Managment: Goal: General experience of comfort will improve Outcome: Progressing   Problem: Elimination: Goal: Will not experience complications related to bowel motility Outcome: Progressing Goal: Will not experience complications related to urinary retention Outcome: Progressing   Problem: Safety: Goal: Ability to remain free from injury will improve Outcome: Progressing   Problem: Skin Integrity: Goal: Risk for impaired skin integrity will decrease Outcome: Progressing

## 2020-11-30 NOTE — Op Note (Signed)
Barbara Clark, Barbara Clark MEDICAL RECORD NO: [id number] ACCOUNT NO: [id number] DATE OF BIRTH: 01-24-1975 FACILITY: [facility] LOCATION: [facility] PHYSICIAN: Revonda Standard. Roxan Hockey, MD  Operative Report   DATE OF PROCEDURE: 11/28/2020  PREOPERATIVE DIAGNOSIS:  Chest wall mass.  POSTOPERATIVE DIAGNOSIS:  Chest wall mass.  PROCEDURE:   Xi robotic-assisted right video-assisted thoracoscopy and right thoracotomy for resection of chest wall mass. Intercostal nerve blocks levels 3 through 10.  SURGEON:  Revonda Standard. Roxan Hockey, MD  ASSISTANT:  Lars Pinks, PA.  ANESTHESIA:  General.  FINDINGS:  Well encapsulated fatty tumor crossing the chest wall with both extrathoracic and intrathoracic components.  CLINICAL NOTE:  Barbara Clark is a 45 year old woman who was found to have a 4.6 x 5 x 3.8 cm chest wall mass between the fourth and fifth ribs on the right side. 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  PET/CT showed minimal uptake.  She was referred for surgical resection.  After some delays for other reasons, she now presents for surgical resection.  The indications, risks, benefits, and alternatives were  discussed in detail with the patient.  She accepted those risks and agreed to proceed.  OPERATIVE NOTE:  Barbara Clark was brought to the operating room on 11/28/2020.  She had induction of general anesthesia and was intubated with a double lumen endotracheal tube.  Sequential compression devices were placed on the calves for DVT prophylaxis.   A Foley catheter was placed.  Intravenous antibiotics were administered.  She was placed in a left lateral decubitus position.  A Bair Hugger was placed for active warming.  The right chest was prepped and draped in the usual sterile fashion.    A timeout was performed.  A solution containing 20 mL of liposomal bupivacaine, 30 mL of 0.5% bupivacaine and 50 mL of saline was  prepared.  This solution was used for local at the incision sites as well as for the intercostal nerve blocks.  A timeout was  performed.  An incision was made in the eighth interspace in the midaxillary line.  An 8 mm robotic port was inserted. The thoracoscope was advanced into the chest.  After confirming intrapleural placement, carbon dioxide was insufflated per protocol.  A  12 mm port was placed 5 cm anterior to the camera port. Intercostal nerve blocks were performed from the third to the tenth interspace.  10 mL of the bupivacaine solution was injected into a subpleural plane at each level. An 8 mm port was placed in the  eighth interspace posterior to the camera port and then an 8 mm AirSeal port was also placed.  The robot was deployed.  The camera arm was docked.  Targeting was performed.  The remaining arms were docked. The robotic instruments were inserted with  thoracoscopic visualization.  There was good isolation of the right lung.  The mass was immediately visible in the lateral aspect of the chest wall.  Dissection was begun scoring the pleura approximately a centimeter away from the mass and then working  to the mass inferiorly, anteriorly, posteriorly. Superiorly, the pleura was scored, but minimal dissection was done from that direction due to visualization. As the rib was approached, there was no plane to continue dissection from an internal  approach. The sponges that had been placed were removed.  The robotic instruments were removed.  The robot was undocked.  A lateral thoracotomy incision was made just below the tip of the  scapula.  This was carried through the skin and subcutaneous  tissue.  Hemostasis was achieved with cautery.  The latissimus muscle was divided, part of the serratus muscle was divided and the remainder was retracted anteriorly.  The mass was again easily visible.  It was relatively well encapsulated with clear  planes around it.  These planes were developed and  the mass was removed without having to divide any ribs.  The mass was sent for frozen section, which revealed a fatty tumor, but no differentiation could be made to determine whether this was a  liposarcoma on the frozen section.  Grossly, all margins were negative. A #2 Vicryl pericostal suture was placed to approximate the fourth and fifth ribs. A 28-French Blake drain was placed through the original robotic port incision and secured with a #1  silk suture.  Dual lung ventilation was resumed.  The thoracotomy incision was closed in standard fashion as were the remaining port incisions.  Dermabond was applied to the skin incisions.  The chest tube was placed to a Pleur-Evac on waterseal.  The  patient was placed back in a supine position.  She was extubated in the operating room and taken to the postanesthetic care unit in good condition.  All sponge, needle and instrument counts were correct at the end of the procedure.    Lars Pinks, PA assisted during this procedure due to its complexity, providing retraction, exposure and instrument exchange as well as suturing of wounds.       PAA D: 11/29/2020 5:43:45 pm T: 11/30/2020 12:54:00 am  JOB: [id number]/ [id number]

## 2020-11-30 NOTE — Progress Notes (Signed)
   11/30/20 0408  Assess: MEWS Score  Temp 98.9 F (37.2 C)  BP (!) 91/53  Pulse Rate 86  ECG Heart Rate 86  Resp 13  SpO2 97 %  Assess: MEWS Score  MEWS Temp 0  MEWS Systolic 1  MEWS Pulse 0  MEWS RR 1  MEWS LOC 0  MEWS Score 2  MEWS Score Color Yellow  Assess: if the MEWS score is Yellow or Red  Were vital signs taken at a resting state? Yes  Focused Assessment Change from prior assessment (see assessment flowsheet)  Early Detection of Sepsis Score *See Row Information* Low  MEWS guidelines implemented *See Row Information* No, vital signs rechecked  Treat  MEWS Interventions Other (Comment) (rechecked)  Notify: Charge Nurse/RN  Date Charge Nurse/RN Notified 11/30/20  Time Charge Nurse/RN Notified 1886

## 2020-11-30 NOTE — Progress Notes (Addendum)
[address]       [place] [address]             [phone]      2 Days Post-Op Procedure(s) (LRB): XI ROBOTIC ASSISTED RESECTION OF CHEST WALL MASS (Right) INTERCOSTAL NERVE BLOCK (Right) Subjective: Feeling poorly, c/o dizziness  Objective: Vital signs in last 24 hours: Temp:  [98.4 F (36.9 C)-99.6 F (37.6 C)] 98.4 F (36.9 C) (11/12 0718) Pulse Rate:  [86-100] 90 (11/12 0718) Cardiac Rhythm: Normal sinus rhythm (11/12 0700) Resp:  [13-23] 17 (11/12 0718) BP: (91-119)/(53-70) 107/67 (11/12 0718) SpO2:  [90 %-97 %] 91 % (11/12 0718)  Hemodynamic parameters for last 24 hours:    Intake/Output from previous day: 11/11 0701 - 11/12 0700 In: 600 [P.O.:600] Out: 525 [Urine:350; Chest Tube:175] Intake/Output this shift: No intake/output data recorded.  General appearance: alert, cooperative, fatigued, and no distress Heart: regular rate and rhythm Lungs: dim in bases Abdomen: some "fullness", + BS, obese Extremities: no edema Wound: incis healing well  Lab Results: Recent Labs    11/29/20 0058 11/30/20 0104  WBC 14.5* 14.5*  HGB 10.5* 10.0*  HCT 32.5* 30.6*  PLT 397 331   BMET:  Dim in bases  PT/INR: No results for input(s): LABPROT, INR in the last 72 hours. ABG    Component Value Date/Time   PHART 7.399 11/26/2020 1454   HCO3 23.1 11/26/2020 1454   TCO2 25 11/28/2020 1014   ACIDBASEDEF 1.0 11/26/2020 1454   O2SAT 97.3 11/26/2020 1454   CBG (last 3)  Recent Labs    11/29/20 1608 11/29/20 2138 11/30/20 0642  GLUCAP 144* 147* 115*    Meds Scheduled Meds:  acetaminophen  1,000 mg Oral Q6H   Or   acetaminophen (TYLENOL) oral liquid 160 mg/5 mL  1,000 mg Oral Q6H   atorvastatin  10 mg Oral QHS   bisacodyl  10 mg Oral Daily   Chlorhexidine Gluconate Cloth  6 each Topical Daily   enoxaparin (LOVENOX) injection  40 mg Subcutaneous Q24H   insulin pump   Subcutaneous TID WC, HS, 0200   ketorolac  30 mg Intravenous Q6H    losartan  25 mg Oral QHS   pregabalin  25 mg Oral QHS   Followed by   Derrill Memo ON 12/01/2020] pregabalin  25 mg Oral BID   senna-docusate  1 tablet Oral QHS   Continuous Infusions:  0.9 % NaCl with KCl 20 mEq / L 10 mL/hr at 11/29/20 0759   PRN Meds:.fentaNYL (SUBLIMAZE) injection, ondansetron (ZOFRAN) IV, oxyCODONE  Xrays DG Chest Port 1 View  Result Date: 11/28/2020 CLINICAL DATA:  Post op resection of chest wall mass for fatty tumor. EXAM: PORTABLE CHEST 1 VIEW COMPARISON:  11/26/2020 FINDINGS: Postsurgical changes in the right chest. Right chest tube in place. Gas in the right chest wall. Pleural base mass right upper lobe appears resected. There is an EKG lead overlying this area. No pneumothorax Mild right lower lobe atelectasis.  No effusion.  Left lung clear. IMPRESSION: Postop resection of pleural base mass on the right. Right chest tube in place with mild right lower lobe atelectasis. Negative for pneumothorax. Electronically Signed   By: Franchot Gallo M.D.   On: 11/28/2020 14:13    Assessment/Plan: S/P Procedure(s) (LRB): XI ROBOTIC ASSISTED RESECTION OF CHEST WALL MASS (Right) INTERCOSTAL NERVE BLOCK (Right) No flowsheet data found.  1 Tmax 99.6  s BP 91- 100's 2 sats good on RA 3 CXR- basilar  atx, minor effus 4 minor hyponatremia 5 normal renal fxn, voiding 6 leukocytosis stable with WBC 14, probably reactive 7 expected ABLA - stable 8 BS control is good- on insulin pump 9 needs further ambulation/pulm toilet, hopefully dizziness will improve quickly- does not appear ready for d/c this am, will add laxative for constipation   LOS: 2 days    John Giovanni PA-C Pager [phone] 11/30/2020    Chart reviewed, patient examined, agree with above. Dizzy with getting up. No appetite. Wants a bedside commode.

## 2020-11-30 NOTE — Progress Notes (Signed)
   11/30/20 0408 11/30/20 0455  Assess: MEWS Score  BP (!) 91/53 111/70  Pulse Rate 86 88  ECG Heart Rate 86 88  Resp 13 14  Level of Consciousness  --  Alert  Assess: MEWS Score  MEWS Temp 0 0  MEWS Systolic 1 0  MEWS Pulse 0 0  MEWS RR 1 0  MEWS LOC 0 0  MEWS Score 2 0  MEWS Score Color Yellow Green  Assess: if the MEWS score is Yellow or Red  Were vital signs taken at a resting state?  --  Yes  Focused Assessment  --  Change from prior assessment (see assessment flowsheet)  Early Detection of Sepsis Score *See Row Information*  --  Low  MEWS guidelines implemented *See Row Information*  --  No, other (Comment) (rechecked vitals)

## 2020-12-01 LAB — GLUCOSE, CAPILLARY
Glucose-Capillary: 123 mg/dL — ABNORMAL HIGH (ref 70–99)
Glucose-Capillary: 83 mg/dL (ref 70–99)
Glucose-Capillary: 160 mg/dL — ABNORMAL HIGH (ref 70–99)
Glucose-Capillary: 136 mg/dL — ABNORMAL HIGH (ref 70–99)
Glucose-Capillary: 99 mg/dL (ref 70–99)
Glucose-Capillary: 131 mg/dL — ABNORMAL HIGH (ref 70–99)
Glucose-Capillary: 99 mg/dL (ref 70–99)

## 2020-12-01 NOTE — Progress Notes (Addendum)
[address]       [place] [address]             [phone]      3 Days Post-Op Procedure(s) (LRB): XI ROBOTIC ASSISTED RESECTION OF CHEST WALL MASS (Right) INTERCOSTAL NERVE BLOCK (Right) Subjective: Yesterday was difficult d/t dizziness and weakness, she attributes to pain meds. Now only wants tylenol  Objective: Vital signs in last 24 hours: Temp:  [98.2 F (36.8 C)-99.2 F (37.3 C)] 98.2 F (36.8 C) (11/13 0729) Pulse Rate:  [93-117] 117 (11/13 0729) Cardiac Rhythm: Sinus tachycardia (11/13 0700) Resp:  [16-20] 17 (11/13 0729) BP: (102-146)/(48-75) 122/75 (11/13 0729) SpO2:  [92 %-95 %] 95 % (11/13 0729)  Hemodynamic parameters for last 24 hours:    Intake/Output from previous day: 11/12 0701 - 11/13 0700 In: 480 [P.O.:480] Out: -  Intake/Output this shift: No intake/output data recorded.  General appearance: alert, cooperative, and no distress Heart: regular rate and rhythm Lungs: clear to auscultation bilaterally Abdomen: benign Extremities: no edema Wound: incis healing well  Lab Results: Recent Labs    11/29/20 0058 11/30/20 0104  WBC 14.5* 14.5*  HGB 10.5* 10.0*  HCT 32.5* 30.6*  PLT 397 331   BMET:  Recent Labs    11/29/20 0058 11/30/20 0104  NA 134* 133*  K 4.0 3.8  CL 103 103  CO2 23 23  GLUCOSE 153* 135*  BUN 9 11  CREATININE 0.82 0.77  CALCIUM 8.0* 7.9*    PT/INR: No results for input(s): LABPROT, INR in the last 72 hours. ABG    Component Value Date/Time   PHART 7.399 11/26/2020 1454   HCO3 23.1 11/26/2020 1454   TCO2 25 11/28/2020 1014   ACIDBASEDEF 1.0 11/26/2020 1454   O2SAT 97.3 11/26/2020 1454   CBG (last 3)  Recent Labs    12/01/20 0216 12/01/20 0451 12/01/20 0629  GLUCAP 123* 83 160*    Meds Scheduled Meds:  acetaminophen  1,000 mg Oral Q6H   Or   acetaminophen (TYLENOL) oral liquid 160 mg/5 mL  1,000 mg Oral Q6H   atorvastatin  10 mg Oral QHS   bisacodyl  10 mg Oral Daily    enoxaparin (LOVENOX) injection  40 mg Subcutaneous Q24H   insulin pump   Subcutaneous TID WC, HS, 0200   ketorolac  30 mg Intravenous Q6H   losartan  25 mg Oral QHS   pregabalin  25 mg Oral BID   senna-docusate  1 tablet Oral QHS   Continuous Infusions:  0.9 % NaCl with KCl 20 mEq / L 10 mL/hr at 12/01/20 0205   PRN Meds:.fentaNYL (SUBLIMAZE) injection, lactulose, ondansetron (ZOFRAN) IV, oxyCODONE  Xrays DG Chest 2 View  Result Date: 11/30/2020 CLINICAL DATA:  Encounter for chest wall mass. Patient is status post resection of chest wall mass for fatty tumor. EXAM: CHEST - 2 VIEW COMPARISON:  November 28, 2020 FINDINGS: Subcutaneous emphysema is identified over the right chest. There is atelectasis of the left lung base. There is probable minimal left pleural effusion. No pneumonia or pneumothorax is identified. Bony structures are normal. The mediastinal contour and cardiac silhouette are normal. IMPRESSION: 1. Subcutaneous emphysema identified over the right chest. 2. Atelectasis of the left lung base with probable minimal left pleural effusion. Electronically Signed   By: Abelardo Diesel M.D.   On: 11/30/2020 08:47    Assessment/Plan: S/P Procedure(s) (LRB): XI ROBOTIC ASSISTED RESECTION OF CHEST WALL MASS (Right) INTERCOSTAL NERVE BLOCK (Right)  1 Tmax 99.2, VSS s BP 102-146, sinus rhythm, sinus tachy up to 130's 2 sats good on RA 3 + BM 4 no new lab 5 BS control is reasonable but did have one hypoglycemic event- cont  insulin pump 6 appears more motivated to be active today as dizziness/weakness appears improved . Limit pain meds to tylenol as much as is feasible and push rehab as able- she should hopefully be ready for d/c tomorrow      LOS: 3 days    John Giovanni PA-C Pager [phone] 12/01/2020    Chart reviewed, patient examined, agree with above. Feeling better today. Ambulate, IS. Plan home tomorrow.

## 2020-12-01 NOTE — Progress Notes (Signed)
Mobility Specialist Progress Note    12/01/20 1550  Mobility  Activity Ambulated in hall  Level of Assistance Contact guard assist, steadying assist  Assistive Device Front wheel walker  Distance Ambulated (ft) 48 ft  Mobility Ambulated with assistance in hallway  Mobility Response Tolerated fair  Mobility performed by Mobility specialist  Bed Position Chair  $Mobility charge 1 Mobility   Pre-Mobility: 98 HR, 113/59 BP, 100% SpO2  Pt received in bed and agreeable. Had successful void on BSC. Took multiple breaks leaning down onto walker and c/o feeling SOB. Moved slowly and felt the need to cough frequently. Returned to chair with call bell in reach and NT present.   Barbara Clark Mobility Specialist  Mobility Specialist Phone: [phone]

## 2020-12-01 NOTE — Plan of Care (Signed)

## 2020-12-02 ENCOUNTER — Inpatient Hospital Stay (HOSPITAL_COMMUNITY): Payer: Medicaid Other

## 2020-12-02 LAB — URINALYSIS, COMPLETE (UACMP) WITH MICROSCOPIC
Bilirubin Urine: NEGATIVE
Glucose, UA: NEGATIVE mg/dL
Hgb urine dipstick: NEGATIVE
Ketones, ur: NEGATIVE mg/dL
Nitrite: NEGATIVE
Protein, ur: NEGATIVE mg/dL
Specific Gravity, Urine: 1.011 (ref 1.005–1.030)
pH: 6 (ref 5.0–8.0)

## 2020-12-02 LAB — GLUCOSE, CAPILLARY
Glucose-Capillary: 130 mg/dL — ABNORMAL HIGH (ref 70–99)
Glucose-Capillary: 135 mg/dL — ABNORMAL HIGH (ref 70–99)
Glucose-Capillary: 116 mg/dL — ABNORMAL HIGH (ref 70–99)
Glucose-Capillary: 160 mg/dL — ABNORMAL HIGH (ref 70–99)
Glucose-Capillary: 162 mg/dL — ABNORMAL HIGH (ref 70–99)
Glucose-Capillary: 110 mg/dL — ABNORMAL HIGH (ref 70–99)
Glucose-Capillary: 98 mg/dL (ref 70–99)

## 2020-12-02 MED ORDER — TRAMADOL HCL 50 MG PO TABS
50.0000 mg | ORAL_TABLET | Freq: Four times a day (QID) | ORAL | Status: DC | PRN
  Administered 2020-12-02 – 2020-12-03 (×4): 50 mg via ORAL
  Filled 2020-12-02 (×4): qty 1

## 2020-12-02 MED ORDER — SODIUM CHLORIDE 0.9 % IV BOLUS
500.0000 mL | Freq: Once | INTRAVENOUS | Status: AC
  Administered 2020-12-02: 500 mL via INTRAVENOUS

## 2020-12-02 NOTE — Progress Notes (Signed)
Mobility Specialist Progress Note    12/02/20 1312  Mobility  Activity Ambulated in room  Level of Assistance Modified independent, requires aide device or extra time  Assistive Device  (HHA)  Distance Ambulated (ft) 18 ft  Mobility Ambulated with assistance in room  Mobility Response Tolerated fair  Mobility performed by Mobility specialist  Bed Position Chair  $Mobility charge 1 Mobility   Pt received in bed and agreeable. C/o sharp pain in right side and said it was now a 6/10. Moved slowly and grimaced frequently in pain. Left in chair with call bell in reach.   [name] Mobility Specialist  Mobility Specialist Phone: [phone]

## 2020-12-02 NOTE — Progress Notes (Addendum)
      [address]       [place] [address]             [phone]    Spoke with patient and she is tolerating the Tramadol and Lyrica.  However, overall she states she doesn't feel well.  She states she is experiencing dysuria and is requesting we check her urine sample.  Contacted via nursing later that patient was complaining of chest discomfort and shortness of breath.  She was mildly tachycardic, oxygen saturations were at 95%.    A/P:  Check UA- per nursing urine is very dark/concentrated Stat CXR- stable w/o change Tachycardia- could be due to mild dehydration especially with dark/concentrated urine... will give a 500 cc NS bolus  Patient does not feel up to discharge home today.  Ellwood Handler, PA-C

## 2020-12-02 NOTE — Plan of Care (Signed)

## 2020-12-02 NOTE — Progress Notes (Addendum)
      [address]       [place] [address]             [phone]       4 Days Post-Op Procedure(s) (LRB): XI ROBOTIC ASSISTED RESECTION OF CHEST WALL MASS (Right) INTERCOSTAL NERVE BLOCK (Right)  Subjective: Patient states IV and oral pain medications make her feel bad (dizzy,sleep). She wants to try Ultram  Objective: Vital signs in last 24 hours: Temp:  [98.2 F (36.8 C)-99 F (37.2 C)] 98.4 F (36.9 C) (11/14 0405) Pulse Rate:  [96-117] 96 (11/14 0405) Cardiac Rhythm: Sinus tachycardia (11/13 1907) Resp:  [17-22] 22 (11/14 0405) BP: (100-137)/(53-75) 106/63 (11/14 0405) SpO2:  [93 %-97 %] 94 % (11/14 0405)     Intake/Output from previous day: No intake/output data recorded.   Physical Exam:  Cardiovascular: Tachycardic this am Pulmonary: Slightly diminished right base Abdomen: Soft, non tender, bowel sounds present. Extremities: No lower extremity edema. Wounds: Clean and dry.  No erythema or signs of infection.   Lab Results: CBC: Recent Labs    11/30/20 0104  WBC 14.5*  HGB 10.0*  HCT 30.6*  PLT 331   BMET:  Recent Labs    11/30/20 0104  NA 133*  K 3.8  CL 103  CO2 23  GLUCOSE 135*  BUN 11  CREATININE 0.77  CALCIUM 7.9*    PT/INR: No results for input(s): LABPROT, INR in the last 72 hours. ABG:  INR: Will add last result for INR, ABG once components are confirmed Will add last 4 CBG results once components are confirmed  Assessment/Plan:  1. CV - ST at times. On Losartan 25 mg at hs 2.  Pulmonary - On room air. 3. DM-CBGs 99/135/116. Has Insulin pump.  4. Expected post op blood loss anemia-Last H and H stable at 10 and 30.6 5. Regarding pain control, continue Lyrica and try Ultram. Stop Oxy and Fentanyl at patient request 6. Hope to discharge later today  Sharalyn Ink [facility] 12/02/2020,6:59 AM [phone]   Complains of abdominal pain and nausea Loose stools overnight, likely related to  laxatives Potentially home today if she feels better  Lajuana Matte

## 2020-12-02 NOTE — Progress Notes (Signed)
Mobility Specialist Progress Note    12/02/20 1552  Mobility  Activity Ambulated in hall  Level of Assistance Contact guard assist, steadying assist  Assistive Device  (HHA)  Distance Ambulated (ft) 130 ft  Mobility Ambulated with assistance in hallway  Mobility Response Tolerated fair  Mobility performed by Mobility specialist  Bed Position Chair  $Mobility charge 1 Mobility   Pt received in chair and agreeable. C/o sharp side pain and SOB during walk. Returned to chair with call bell in reach and mother present in room.   [name] Mobility Specialist  Mobility Specialist Phone: [phone]

## 2020-12-03 LAB — GLUCOSE, CAPILLARY
Glucose-Capillary: 137 mg/dL — ABNORMAL HIGH (ref 70–99)
Glucose-Capillary: 102 mg/dL — ABNORMAL HIGH (ref 70–99)

## 2020-12-03 MED ORDER — SULFAMETHOXAZOLE-TRIMETHOPRIM 800-160 MG PO TABS: 1.0000 | ORAL_TABLET | Freq: Two times a day (BID) | ORAL | 0 refills | Status: AC

## 2020-12-03 MED ORDER — PREGABALIN 25 MG PO CAPS: 25.0000 mg | ORAL_CAPSULE | Freq: Two times a day (BID) | ORAL | 1 refills | Status: DC

## 2020-12-03 MED ORDER — TRAMADOL HCL 50 MG PO TABS: 50.0000 mg | ORAL_TABLET | Freq: Four times a day (QID) | ORAL | 0 refills | Status: AC | PRN

## 2020-12-03 NOTE — Progress Notes (Signed)
      [address]       [place] [address]             [phone]       5 Days Post-Op Procedure(s) (LRB): XI ROBOTIC ASSISTED RESECTION OF CHEST WALL MASS (Right) INTERCOSTAL NERVE BLOCK (Right)  Subjective: Says she is feeling much better today and would like to go home.   Objective: Vital signs in last 24 hours: Temp:  [97.6 F (36.4 C)-98.5 F (36.9 C)] 97.6 F (36.4 C) (11/15 0803) Pulse Rate:  [94-107] 96 (11/15 0803) Cardiac Rhythm: Sinus tachycardia (11/14 1900) Resp:  [16-23] 18 (11/15 0803) BP: (108-136)/(59-78) 133/76 (11/15 0803) SpO2:  [94 %-98 %] 95 % (11/15 0459)     Intake/Output from previous day: 11/14 0701 - 11/15 0700 In: 740.9 [P.O.:237; IV Piggyback:503.9] Out: -    Physical Exam:  Cardiovascular: stable SR Pulmonary: breath sounds are clear Abdomen: Soft, non tender, bowel sounds present. Extremities: No lower extremity edema. Wounds: Clean and dry.  No erythema or signs of infection.   Assessment/Plan:  1. CV - stable BP and cardiac rhythm,  On Losartan 25 mg at hs 2.  Pulmonary - On room air with good sats.  3. DM-CBGs controlled with her Insulin pump.  4. Expected post op blood loss anemia-Last H and H stable at 10 and 30.6. No new lab today. 5. Regarding pain controlled on oral medications. 6. UA nitrate negative but showing many bacteria and 11-20wbc/hpf. Plan for discharge on Septra DS BID for 5 days.  7. Plan discharge today  Antony Odea, PA-C 12/03/2020,8:32 AM [phone]

## 2020-12-04 ENCOUNTER — Telehealth: Payer: Self-pay | Admitting: Internal Medicine

## 2020-12-04 NOTE — Telephone Encounter (Signed)
I have called the pt and we have rescheduled her appt.  Nothing further is needed.

## 2020-12-04 NOTE — Telephone Encounter (Signed)
It will be better to reschedule her visit for later, when she is able to drive or get a ride, so I can listen to her chest. Hope she is recovering ok from her surgery.

## 2020-12-04 NOTE — Telephone Encounter (Signed)
CY please advise.  Pt is scheduled to see you on 11.18 at 930.  thanks

## 2020-12-06 ENCOUNTER — Ambulatory Visit: Payer: Medicaid Other | Admitting: Internal Medicine

## 2020-12-06 ENCOUNTER — Telehealth: Payer: Self-pay | Admitting: Family Medicine

## 2020-12-06 ENCOUNTER — Emergency Department (HOSPITAL_COMMUNITY)
Admission: EM | Admit: 2020-12-06 | Discharge: 2020-12-06 | Disposition: A | Discharge status: Home / Self Care | Payer: Medicaid Other | Attending: Emergency Medicine | Admitting: Emergency Medicine

## 2020-12-06 ENCOUNTER — Other Ambulatory Visit: Payer: Self-pay

## 2020-12-06 ENCOUNTER — Emergency Department (HOSPITAL_COMMUNITY): Payer: Medicaid Other

## 2020-12-06 ENCOUNTER — Encounter (HOSPITAL_COMMUNITY): Payer: Self-pay | Admitting: Emergency Medicine

## 2020-12-06 DIAGNOSIS — Z87891 Personal history of nicotine dependence: Secondary | ICD-10-CM | POA: Diagnosis not present

## 2020-12-06 DIAGNOSIS — R000 Tachycardia, unspecified: Secondary | ICD-10-CM | POA: Diagnosis not present

## 2020-12-06 DIAGNOSIS — Z79899 Other long term (current) drug therapy: Secondary | ICD-10-CM | POA: Insufficient documentation

## 2020-12-06 DIAGNOSIS — Z794 Long term (current) use of insulin: Secondary | ICD-10-CM | POA: Insufficient documentation

## 2020-12-06 DIAGNOSIS — J90 Pleural effusion, not elsewhere classified: Secondary | ICD-10-CM

## 2020-12-06 DIAGNOSIS — J918 Pleural effusion in other conditions classified elsewhere: Secondary | ICD-10-CM | POA: Insufficient documentation

## 2020-12-06 DIAGNOSIS — R0602 Shortness of breath: Secondary | ICD-10-CM | POA: Diagnosis present

## 2020-12-06 DIAGNOSIS — R202 Paresthesia of skin: Secondary | ICD-10-CM | POA: Diagnosis not present

## 2020-12-06 DIAGNOSIS — E119 Type 2 diabetes mellitus without complications: Secondary | ICD-10-CM | POA: Diagnosis not present

## 2020-12-06 DIAGNOSIS — I10 Essential (primary) hypertension: Secondary | ICD-10-CM | POA: Diagnosis not present

## 2020-12-06 LAB — CBC
HCT: 32.3 % — ABNORMAL LOW (ref 36.0–46.0)
Hemoglobin: 10.8 g/dL — ABNORMAL LOW (ref 12.0–15.0)
MCH: 30.5 pg (ref 26.0–34.0)
MCHC: 33.4 g/dL (ref 30.0–36.0)
MCV: 91.2 fL (ref 80.0–100.0)
Platelets: 723 10*3/uL — ABNORMAL HIGH (ref 150–400)
RBC: 3.54 MIL/uL — ABNORMAL LOW (ref 3.87–5.11)
RDW: 12.5 % (ref 11.5–15.5)
WBC: 13.2 10*3/uL — ABNORMAL HIGH (ref 4.0–10.5)
nRBC: 0 % (ref 0.0–0.2)

## 2020-12-06 LAB — TROPONIN I (HIGH SENSITIVITY)
Troponin I (High Sensitivity): 3 ng/L (ref <18)
Troponin I (High Sensitivity): 3 ng/L (ref <18)

## 2020-12-06 LAB — BASIC METABOLIC PANEL
Anion gap: 11 (ref 5–15)
BUN: 11 mg/dL (ref 6–20)
CO2: 21 mmol/L — ABNORMAL LOW (ref 22–32)
Calcium: 8.3 mg/dL — ABNORMAL LOW (ref 8.9–10.3)
Chloride: 101 mmol/L (ref 98–111)
Creatinine, Ser: 0.9 mg/dL (ref 0.44–1.00)
GFR, Estimated: >60 mL/min (ref >60)
Glucose, Bld: 190 mg/dL — ABNORMAL HIGH (ref 70–99)
Potassium: 5.7 mmol/L — ABNORMAL HIGH (ref 3.5–5.1)
Sodium: 133 mmol/L — ABNORMAL LOW (ref 135–145)

## 2020-12-06 LAB — I-STAT BETA HCG BLOOD, ED (MC, WL, AP ONLY): I-stat hCG, quantitative: <5 m[IU]/mL (ref <5)

## 2020-12-06 LAB — CBG MONITORING, ED: Glucose-Capillary: 211 mg/dL — ABNORMAL HIGH (ref 70–99)

## 2020-12-06 MED ORDER — TRAMADOL HCL 50 MG PO TABS
50.0000 mg | ORAL_TABLET | Freq: Once | ORAL | Status: AC
  Administered 2020-12-06: 50 mg via ORAL
  Filled 2020-12-06: qty 1

## 2020-12-06 MED ORDER — IOHEXOL 350 MG/ML SOLN
80.0000 mL | Freq: Once | INTRAVENOUS | Status: AC | PRN
  Administered 2020-12-06: 80 mL via INTRAVENOUS

## 2020-12-06 MED ORDER — SODIUM CHLORIDE 0.9 % IV SOLN: INTRAVENOUS | Status: DC

## 2020-12-06 NOTE — ED Triage Notes (Signed)
Pt c/o right leg numbness, shob, cp, and pain at surgical site since earlier today. Hx of chest wall mass removal on 11/10. Denies fevers, n/v.

## 2020-12-06 NOTE — Telephone Encounter (Signed)
**  After Hours/ Emergency Line Call**  Received a call to report that Leeanne Rio started to feel numbness in her right lower extremity.  States that it started in her foot, then went to her leg and then to her knee and now to her thigh.  She was recently admitted to [facility] for resection of a chest wall tumor. She had a VATS and right thoracotomy with intercostal nerve block.  Since her procedure she has been a little bit short of breath, which she was told was to be expected.  She has not noticed any swelling in her legs.  Her lower extremity is not hot or pale.  She states that she was advised that if she were to develop any numbness, she should seek immediate evaluation.  I advised her that I cannot evaluate her over the phone.  Given her procedure, she is at risk for developing a blood clot.  I think it is fair that she be evaluated in the ED further.  Patient is amenable.  Will route to PCP.  Sharion Settler, DO PGY-2, [facility] 12/06/2020 8:03 PM

## 2020-12-06 NOTE — ED Provider Notes (Signed)
[facility] DEPT Provider Note   CSN: [id number] Arrival date & time: 12/06/20  2030     History Chief Complaint  Patient presents with  . Post-op Problem    Barbara Clark is a 45 y.o. female.  HPI  Patient presents to the ED for evaluation of shortness of breath and chest discomfort.  Patient was in the hospital earlier this month.  She was admitted on November 10 and had resection of a chest wall mass by Dr. Roxan Hockey, cardiothoracic surgery.  Patient was discharged on November 15.  Patient states she was recovering from the surgery when this evening she had sudden onset of some numbness in her right leg.  She has not noticed any leg swelling.  She has not noticed any weakness but then she began feeling very short of breath.  She was also having some pain in her chest.  Patient called her doctor who instructed her to come to the ED for evaluation.  Patient denies any fevers or chills.  No vomiting or diarrhea.  Pain is sharp and she does not feel like she can catch her breath  Past Medical History:  Diagnosis Date  . Abnormality of gait 12/26/2012  . Anxiety   . Chronic leg pain 04/27/2014  . Depression   . Diabetes mellitus    Type I  . DKA (diabetic ketoacidosis) ([place]) 09/03/2017  . Dyspnea   . HSV-2 (herpes simplex virus 2) infection 2012   serology  . Hypertension   . Limb weakness 08/04/2013  . Menorrhagia 04/27/2014  . PONV (postoperative nausea and vomiting)   . Right leg weakness 02/20/2014  . Sleep apnea 10/15/2020  . Stuttering 02/02/2013    Patient Active Problem List   Diagnosis Date Noted  . Mass of right chest wall 11/28/2020  . Urinary frequency 07/15/2020  . Cellulitis 06/11/2020  . Right rotator cuff tear 05/10/2020  . 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 Encounter for administration of COVID-19 vaccine 03/15/2020  . Chronic cough 12/22/2019  . Cataract 09/26/2019  . De Quervain's tenosynovitis, bilateral 12/20/2018  .  Carpal tunnel syndrome on right 11/01/2018  . Snoring 08/02/2018  . Obesity (BMI 30-39.9) 10/12/2017  . Back pain, lumbosacral 07/06/2017  . Vaginitis 03/27/2016  . Right shoulder pain 05/07/2015  . 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 DM neuropathy, type I diabetes mellitus ([place]) 03/13/2014  . Diabetes mellitus type I ([place]) 03/18/2006  . Major depressive disorder, recurrent episode ([place]) 03/18/2006    Past Surgical History:  Procedure Laterality Date  . DENTAL RESTORATION/EXTRACTION WITH X-RAY Left 11/06/2020  . EXAM UNDER ANESTHESIA WITH MANIPULATION OF SHOULDER  03/2020  . EYE SURGERY Bilateral 2021   cataracts  . INTERCOSTAL NERVE BLOCK Right 11/28/2020   Procedure: INTERCOSTAL NERVE BLOCK;  Surgeon: [name], MD;  Location: [facility];  Service: Thoracic;  Laterality: Right;  . TUBAL LIGATION       OB History     Gravida  3   Para  3   Term  3   Preterm      AB      Living  3      SAB      IAB      Ectopic      Multiple      Live Births              Family History  Problem Relation Age of Onset  . Diabetes Mother   . Hypertension Mother   .  Diabetes Maternal Grandmother     Social History   Tobacco Use  . Smoking status: Former    Packs/day: 1.00    Years: 12.00    Pack years: 12.00    Types: Cigarettes    Quit date: 07/14/2017    Years since quitting: 3.4  . Smokeless tobacco: Never  . Tobacco comments:    trying to quit  Vaping Use  . Vaping Use: Never used  Substance Use Topics  . Alcohol use: Not Currently    Comment: rare but on occasion  . Drug use: No    Home Medications Prior to Admission medications   Medication Sig Start Date End Date Taking? Authorizing Provider  ACCU-CHEK GUIDE test strip  09/08/19   [provider]  atorvastatin (LIPITOR) 10 MG tablet TAKE 1 TABLET BY MOUTH EVERYDAY AT BEDTIME 06/26/20   Andrena Mews T, MD  Cholecalciferol (VITAMIN D) 50 MCG (2000 UT) tablet  Take 4,000 Units by mouth daily. Gummies    [provider]  insulin aspart (NOVOLOG) 100 UNIT/ML injection Inject 0-100 Units into the skin continuous. Via pump 06/14/18   [provider]  Insulin Human (INSULIN PUMP) SOLN Inject into the skin. Patient uses novolog 1.6 units/hr in insulin pump.    [provider]  levonorgestrel (MIRENA) 20 MCG/DAY IUD 1 each by Intrauterine route once.    [provider]  losartan (COZAAR) 25 MG tablet TAKE 1 TABLET BY MOUTH EVERYDAY AT BEDTIME 11/18/20   Andrena Mews T, MD  Multiple Vitamin (MULTIVITAMIN) capsule Take 1 capsule by mouth daily.    [provider]  pregabalin (LYRICA) 25 MG capsule Take 1 capsule (25 mg total) by mouth 2 (two) times daily. 12/03/20   Antony Odea, PA-C  sulfamethoxazole-trimethoprim (BACTRIM DS) 800-160 MG tablet Take 1 tablet by mouth 2 (two) times daily for 5 days. 12/03/20 12/08/20  Antony Odea, PA-C  traMADol (ULTRAM) 50 MG tablet Take 1 tablet (50 mg total) by mouth every 6 (six) hours as needed for up to 7 days for moderate pain. 12/03/20 12/10/20  Antony Odea, PA-C  vitamin B-12 (CYANOCOBALAMIN) 500 MCG tablet Take 1,000 mcg by mouth daily. Gummies    [provider]    Allergies    Patient has no known allergies.  Review of Systems   Review of Systems  All other systems reviewed and are negative.  Physical Exam Updated Vital Signs BP 127/70   Pulse (!) 115   Temp 98.9 F (37.2 C) (Oral)   Resp (!) 21   SpO2 96%   Physical Exam Vitals and nursing note reviewed.  Constitutional:      Appearance: She is well-developed. She is ill-appearing.  HENT:     Head: Normocephalic and atraumatic.     Right Ear: External ear normal.     Left Ear: External ear normal.  Eyes:     General: No scleral icterus.       Right eye: No discharge.        Left eye: No discharge.     Conjunctiva/sclera: Conjunctivae normal.  Neck:     Trachea:  No tracheal deviation.  Cardiovascular:     Rate and Rhythm: Regular rhythm. Tachycardia present.  Pulmonary:     Effort: Pulmonary effort is normal. No respiratory distress.     Breath sounds: Normal breath sounds. No stridor. No wheezing or rales.  Abdominal:     General: Bowel sounds are normal. There is no distension.  Palpations: Abdomen is soft.     Tenderness: There is no abdominal tenderness. There is no guarding or rebound.  Musculoskeletal:        General: No tenderness or deformity.     Cervical back: Neck supple.     Right lower leg: No edema.     Left lower leg: No edema.  Skin:    General: Skin is warm and dry.     Findings: No rash.  Neurological:     General: No focal deficit present.     Mental Status: She is alert.     Cranial Nerves: No cranial nerve deficit (no facial droop, extraocular movements intact, no slurred speech).     Sensory: No sensory deficit.     Motor: No abnormal muscle tone or seizure activity.     Coordination: Coordination normal.     Comments: Sensation intact bilateral lower extremities, 5 out of 5 plantar flexion strength bilaterally  Psychiatric:        Mood and Affect: Mood normal.    ED Results / Procedures / Treatments   Labs (all labs ordered are listed, but only abnormal results are displayed) Labs Reviewed  BASIC METABOLIC PANEL - Abnormal; Notable for the following components:      Result Value   Sodium 133 (*)    Potassium 5.7 (*)    CO2 21 (*)    Glucose, Bld 190 (*)    Calcium 8.3 (*)    All other components within normal limits  CBC - Abnormal; Notable for the following components:   WBC 13.2 (*)    RBC 3.54 (*)    Hemoglobin 10.8 (*)    HCT 32.3 (*)    Platelets 723 (*)    All other components within normal limits  CBG MONITORING, ED - Abnormal; Notable for the following components:   Glucose-Capillary 211 (*)    All other components within normal limits  I-STAT BETA HCG BLOOD, ED ([facility], [facility], [facility] ONLY)   TROPONIN I (HIGH SENSITIVITY)  TROPONIN I (HIGH SENSITIVITY)    EKG EKG Interpretation  Date/Time:  Friday December 06 2020 20:51:19 EST Ventricular Rate:  130 PR Interval:  151 QRS Duration: 88 QT Interval:  306 QTC Calculation: 450 R Axis:   135 Text Interpretation: Sinus tachycardia Probable left atrial enlargement Left posterior fascicular block Low voltage, precordial leads Borderline T abnormalities, inferior leads Baseline wander in lead(s) V6 Since last tracing rate faster Confirmed by Dorie Rank [phone]) on 12/06/2020 10:12:06 PM EKG notable for sinus tachycardia rate 130 Radiology CT Angio Chest PE W and/or Wo Contrast  Result Date: 12/06/2020 CLINICAL DATA:  Tachycardia and shortness of breath, pain at surgery site, initial encounter EXAM: CT ANGIOGRAPHY CHEST WITH CONTRAST TECHNIQUE: Multidetector CT imaging of the chest was performed using the standard protocol during bolus administration of intravenous contrast. Multiplanar CT image reconstructions and MIPs were obtained to evaluate the vascular anatomy. CONTRAST:  66mL OMNIPAQUE IOHEXOL 350 MG/ML SOLN COMPARISON:  Chest x-ray from earlier in the same day. FINDINGS: Cardiovascular: Thoracic aorta and its branches are within normal limits. No cardiac enlargement is noted. The pulmonary artery shows a normal branching pattern without evidence of filling defect to suggest pulmonary embolism. Mediastinum/Nodes: Thoracic inlet is within normal limits. No sizable hilar or mediastinal adenopathy is noted. The esophagus is within normal limits. Lungs/Pleura: The left lung demonstrates basilar atelectasis similar to that seen on recent chest x-ray. No sizable effusion is noted. There is a moderate to large right-sided  pleural effusion which has a somewhat lobulated appearance suggestive of loculation with associated lower lobe consolidation. No sizable parenchymal nodule is noted. Upper Abdomen: Visualized upper abdomen appears within  normal limits. Musculoskeletal: No acute rib abnormality is noted. Subcutaneous air is noted along the right chest wall superiorly consistent with the recent surgery. Mild fluid is noted along the lateral aspect of the right chest wall measuring approximately 5.1 by 2.2 cm. These changes likely represent a postoperative seroma given the recent surgery. The previously seen lipomatous mass is no longer identified consistent with the recent surgery. Review of the MIP images confirms the above findings. IMPRESSION: Loculated right-sided pleural effusion with associated lower lobe consolidation Likely postoperative seroma along the lateral chest wall as described. No evidence of pulmonary emboli. Mild left basilar atelectasis. Electronically Signed   By: Inez Catalina M.D.   On: 12/06/2020 22:40   DG Chest Portable 1 View  Result Date: 12/06/2020 CLINICAL DATA:  Tachycardia and shortness of breath EXAM: PORTABLE CHEST 1 VIEW COMPARISON:  12/02/2020 FINDINGS: Cardiac shadow is mildly prominent but stable. Overall inspiratory effort is poor with bibasilar airspace opacities worse on the right than the left and significantly increased from the prior exam. Right-sided effusion is noted as well. No bony abnormality is seen. IMPRESSION: Poor inspiratory effort with bibasilar opacities right greater than left as well as right-sided pleural effusion. Electronically Signed   By: Inez Catalina M.D.   On: 12/06/2020 21:21    Procedures Procedures   Medications Ordered in ED Medications  0.9 %  sodium chloride infusion ( Intravenous New Bag/Given 12/06/20 2109)  traMADol (ULTRAM) tablet 50 mg (50 mg Oral Given 12/06/20 2109)  iohexol (OMNIPAQUE) 350 MG/ML injection 80 mL (80 mLs Intravenous Contrast Given 12/06/20 2226)    ED Course  I have reviewed the triage vital signs and the nursing notes.  Pertinent labs & imaging results that were available during my care of the patient were reviewed by me and considered in  my medical decision making (see chart for details).  Clinical Course as of 12/06/20 2311  Fri Dec 06, 2020  2125 Preliminary chest x-ray reviewed.  Patient appears to have a pleural effusion that is new since her discharge [JK]  2141 X-ray shows bibasilar opacities and a pleural effusion on the right side [JK]  2258 CT scan does not show evidence of PE.  Loculated right-sided pleural effusion with associated lower lobe Consolidation. Likely postoperative seroma along the lateral chest wall as described.   [PI]  9518 Reviewed findings with Dr. Roxan Hockey.  Patient can follow-up as an outpatient.  No need for hospitalization regarding her effusion [JK]    Clinical Course User Index [JK] Dorie Rank, MD   MDM Rules/Calculators/A&P                          Patient presented with pleuritic chest pain.  She was notably tachycardic initially.  Concerned about the possibility of pneumothorax as well as pulmonary embolism.  Chest x-ray showed evidence of atelectasis and pleural effusion.  EKG and troponin normal.  Doubt acute coronary syndrome.  CT scan was performed to evaluate for pulmonary embolism.  No signs of PE but the patient does have evidence of a pleural effusion and possibly some consolidation.  She is afebrile and her white count is stable.  Suspect the fluid is related to her recent surgery but doubt infection at this time.  Case was discussed with cardiothoracic surgery  Dr. Roxan Hockey.  Patient will follow-up closely as an outpatient.  Patient is feeling much better at this time and is comfortable with discharge  Final Clinical Impression(s) / ED Diagnoses Final diagnoses:  Pleural effusion    Rx / DC Orders ED Discharge Orders     None        Dorie Rank, MD 12/06/20 2312

## 2020-12-06 NOTE — Discharge Instructions (Signed)
Continue your current medications.  Follow up with Dr Roxan Hockey This coming week.  Return as needed for worsening symptoms

## 2020-12-09 ENCOUNTER — Other Ambulatory Visit: Payer: Self-pay | Admitting: *Deleted

## 2020-12-09 ENCOUNTER — Ambulatory Visit
Admission: RE | Admit: 2020-12-09 | Discharge: 2020-12-09 | Disposition: A | Discharge status: Home / Self Care | Payer: Medicaid Other | Source: Ambulatory Visit | Attending: Thoracic Surgery (Cardiothoracic Vascular Surgery) | Admitting: Thoracic Surgery (Cardiothoracic Vascular Surgery)

## 2020-12-09 ENCOUNTER — Encounter: Payer: Self-pay | Admitting: Thoracic Surgery (Cardiothoracic Vascular Surgery)

## 2020-12-09 ENCOUNTER — Other Ambulatory Visit: Payer: Self-pay

## 2020-12-09 ENCOUNTER — Ambulatory Visit (INDEPENDENT_AMBULATORY_CARE_PROVIDER_SITE_OTHER): Payer: Medicaid Other | Admitting: Thoracic Surgery (Cardiothoracic Vascular Surgery)

## 2020-12-09 VITALS — BP 130/82 | HR 112 | Resp 20 | Ht 67.0 in | Wt 232.0 lb

## 2020-12-09 DIAGNOSIS — J90 Pleural effusion, not elsewhere classified: Secondary | ICD-10-CM

## 2020-12-09 DIAGNOSIS — Z9889 Other specified postprocedural states: Secondary | ICD-10-CM

## 2020-12-09 MED ORDER — PREDNISONE 10 MG (21) PO TBPK: ORAL_TABLET | ORAL | 0 refills | Status: AC
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
## 2020-12-09 NOTE — Progress Notes (Signed)
Barbara Clark,Barbara Clark             [phone]       HPI: Barbara Clark returns for follow-up after recent resection of a chest wall lipoma.  Barbara Clark is a 45 year old woman with a history of type 1 diabetes, hypertension, anxiety, and depression.  She was found to have a chest wall mass back in December 2021.  This was a 4.6 x 5 x 3.8 cm fat density mass between the fourth and fifth ribs on the right.  In April she had an MRI which showed no significant change in size.  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  PET/CT showed minimal uptake.  I did a resection of the mass on 11/28/2020.  This was done with a robotic approach intrapleurally and then an open extrapleural incision.  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  It was originating in the intercostal muscles.  She was having some pain in her right leg and was concerned about a blood clot.  She went to the emergency room.  She also noted she was having some shortness of breath.  A CT of the chest showed no evidence of PE.  She did have a complex loculated right pleural effusion.  She has been having a cough.  Pain is pretty well controlled.  Her leg pain is better.  No swelling in that leg.  Past Medical History:  Diagnosis Date   Abnormality of gait 12/26/2012   Anxiety    Chronic leg pain 04/27/2014   Depression    Diabetes mellitus    Type I   DKA (diabetic ketoacidosis) ([place]) 09/03/2017   Dyspnea    HSV-2 (herpes simplex virus 2) infection 2012   serology   Hypertension    Limb weakness 08/04/2013   Menorrhagia 04/27/2014   PONV (postoperative nausea and vomiting)    Right leg weakness 02/20/2014   Sleep apnea 10/15/2020   Stuttering 02/02/2013    Current Outpatient Medications  Medication Sig Dispense Refill   ACCU-CHEK GUIDE test strip      atorvastatin (LIPITOR) 10 MG tablet TAKE 1 TABLET BY MOUTH EVERYDAY AT BEDTIME 90  tablet 2   Cholecalciferol (VITAMIN D) 50 MCG (2000 UT) tablet Take 4,000 Units by mouth daily. Gummies     insulin aspart (NOVOLOG) 100 UNIT/ML injection Inject 0-100 Units into the skin continuous. Via pump     Insulin Human (INSULIN PUMP) SOLN Inject into the skin. Patient uses novolog 1.6 units/hr in insulin pump.     levonorgestrel (MIRENA) 20 MCG/DAY IUD 1 each by Intrauterine route once.     losartan (COZAAR) 25 MG tablet TAKE 1 TABLET BY MOUTH EVERYDAY AT BEDTIME 90 tablet 1   Multiple Vitamin (MULTIVITAMIN) capsule Take 1 capsule by mouth daily.     predniSONE (STERAPRED UNI-PAK 21 TAB) 10 MG (21) TBPK tablet Take 6 tablets (60 mg total) by mouth daily for 1 day, THEN 5 tablets (50 mg total) daily for 1 day, THEN 4 tablets (40 mg total) daily for 1 day, THEN 3 tablets (30 mg total) daily for 1 day, THEN 2 tablets (20 mg total) daily for 1 day, THEN 1 tablet (10 mg total) daily for 1 day. 21 tablet 0   pregabalin (LYRICA) 25 MG capsule Take 1 capsule (25 mg total) by mouth 2 (two) times daily. 60 capsule 1  traMADol (ULTRAM) 50 MG tablet Take 1 tablet (50 mg total) by mouth every 6 (six) hours as needed for up to 7 days for moderate pain. 28 tablet 0   vitamin B-12 (CYANOCOBALAMIN) 500 MCG tablet Take 1,000 mcg by mouth daily. Gummies     No current facility-administered medications for this visit.    Physical Exam BP 130/82 (BP Location: Right Arm, Patient Position: Sitting)   Pulse (!) 112   Resp 20   Ht 5\' 7"  (1.702 m)   Wt 232 lb (105.2 kg)   SpO2 94% Comment: RA  BMI 36.73 kg/m  45 year old woman in no acute distress Alert and oriented x3 with no focal deficits Diminished breath sounds at right base, otherwise clear Incisions clean dry and intact with seroma under the incision. No peripheral edema  Diagnostic Tests: I personally reviewed her chest x-ray.  Also reviewed her CT scans of the weekend.  She has a loculated effusion.  If thing there appears to be's less fluid  on her chest x-ray today than there was on her CT Saturday.  Impression: Barbara Clark is a 45 year old woman with a history of type 1 diabetes, hypertension, anxiety, and depression.  She had resection of a chest wall lipoma on 11/28/2020.  She presented to the ER couple of days ago with some shortness of breath and some leg pain.  She turned out to have a loculated pleural effusion.  She did not have a pulmonary embolus.  She follows up in the office today.  She says her breathing is actually a little better than it was.  Interestingly her chest x-ray looks a little better as well.  I am a little bit surprised by that.  I think given that finding it would not be unreasonable to try to manage this conservatively.  We discussed doing a steroid taper with prednisone.  She understands that this could cause some issues with her blood sugars and she will need to monitor that closely.  She asked about driving.  She is not using pain medication at this point think she is okay to drive on a limited basis.  Plan: Prednisone taper Return 11/29 with PA and lateral CXR  [name], MD [facility] [phone]

## 2020-12-16 ENCOUNTER — Other Ambulatory Visit: Payer: Self-pay | Admitting: Thoracic Surgery (Cardiothoracic Vascular Surgery)

## 2020-12-16 ENCOUNTER — Encounter: Payer: Self-pay | Admitting: Internal Medicine

## 2020-12-16 ENCOUNTER — Other Ambulatory Visit: Payer: Self-pay

## 2020-12-16 ENCOUNTER — Ambulatory Visit (INDEPENDENT_AMBULATORY_CARE_PROVIDER_SITE_OTHER): Payer: Medicaid Other | Admitting: Internal Medicine

## 2020-12-16 DIAGNOSIS — G4733 Obstructive sleep apnea (adult) (pediatric): Secondary | ICD-10-CM | POA: Diagnosis not present

## 2020-12-16 DIAGNOSIS — E669 Obesity, unspecified: Secondary | ICD-10-CM | POA: Diagnosis not present

## 2020-12-16 DIAGNOSIS — R222 Localized swelling, mass and lump, trunk: Secondary | ICD-10-CM

## 2020-12-16 NOTE — Assessment & Plan Note (Signed)
Urged to work on diet and exercise to get weight down.

## 2020-12-16 NOTE — Assessment & Plan Note (Signed)
I expect her to benefit from CPAP. Plan-continue auto 5-20 and return in the insurance required window as discussed.

## 2020-12-16 NOTE — Patient Instructions (Signed)
We will get you back in the time window usually required by Insurance to check on your CPAP, but feel free to call us or the [facility]  company as needed until you feel comfortable with everything.  Try turning the humidifier down a little more to stop the water coming at you.

## 2020-12-17 ENCOUNTER — Ambulatory Visit (INDEPENDENT_AMBULATORY_CARE_PROVIDER_SITE_OTHER): Payer: Self-pay | Admitting: Thoracic Surgery (Cardiothoracic Vascular Surgery)

## 2020-12-17 ENCOUNTER — Ambulatory Visit
Admission: RE | Admit: 2020-12-17 | Discharge: 2020-12-17 | Disposition: A | Discharge status: Home / Self Care | Payer: Medicaid Other | Source: Ambulatory Visit | Attending: Thoracic Surgery (Cardiothoracic Vascular Surgery) | Admitting: Thoracic Surgery (Cardiothoracic Vascular Surgery)

## 2020-12-17 ENCOUNTER — Telehealth: Payer: Self-pay

## 2020-12-17 VITALS — BP 90/50 | HR 100 | Resp 20 | Ht 67.0 in | Wt 234.0 lb

## 2020-12-17 DIAGNOSIS — R222 Localized swelling, mass and lump, trunk: Secondary | ICD-10-CM

## 2020-12-17 DIAGNOSIS — J90 Pleural effusion, not elsewhere classified: Secondary | ICD-10-CM

## 2020-12-17 DIAGNOSIS — Z9889 Other specified postprocedural states: Secondary | ICD-10-CM

## 2020-12-17 NOTE — Progress Notes (Signed)
[address]       [place] [address]             [phone]       HPI: [name] returns for follow-up of her postoperative loculated right pleural effusion.  Barbara Clark is a 45 year old woman with a history of type 1 diabetes, hypertension, anxiety, and depression.  She was found to have a chest wall mass back in December 2021.  This was a 4.6 x 5 x 3.8 cm fat density mass between the fourth and fifth ribs on the right.  In April she had an MRI which showed no significant change in size.  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  PET/CT showed minimal uptake.  I did a resection of the mass on 11/28/2020.  This was done with a robotic approach intrapleurally and then an open extrapleural incision.  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  It was originating in the intercostal muscles.  She presented to the emergency room with some shortness of breath and pain in her leg on 12/06/2020.  There was significant for possible PE.  She had a CT of the chest which showed no PE but she did have a complex loculated right pleural effusion.  I saw her in the office on 12/09/2020.  The x-ray looked a little better than the CT so we put her on a steroid taper and she now returns.  She says that her breathing has improved has not had any additional leg pain.  Overall she feels better than she did a week ago.  Current Outpatient Medications  Medication Sig Dispense Refill   ACCU-CHEK GUIDE test strip      atorvastatin (LIPITOR) 10 MG tablet TAKE 1 TABLET BY MOUTH EVERYDAY AT BEDTIME 90 tablet 2   Cholecalciferol (VITAMIN D) 50 MCG (2000 UT) tablet Take 4,000 Units by mouth daily. Gummies     insulin aspart (NOVOLOG) 100 UNIT/ML injection Inject 0-100 Units into the skin continuous. Via pump     Insulin Human (INSULIN PUMP) SOLN Inject into the skin. Patient uses novolog 1.6 units/hr in insulin pump.     levonorgestrel  (MIRENA) 20 MCG/DAY IUD 1 each by Intrauterine route once.     losartan (COZAAR) 25 MG tablet TAKE 1 TABLET BY MOUTH EVERYDAY AT BEDTIME 90 tablet 1   Multiple Vitamin (MULTIVITAMIN) capsule Take 1 capsule by mouth daily.     pregabalin (LYRICA) 25 MG capsule Take 1 capsule (25 mg total) by mouth 2 (two) times daily. 60 capsule 1   vitamin B-12 (CYANOCOBALAMIN) 500 MCG tablet Take 1,000 mcg by mouth daily. Gummies     No current facility-administered medications for this visit.    Physical Exam BP (!) 90/50   Pulse 100   Resp 20   Ht 5\' 7"  (1.702 m)   Wt 234 lb (106.1 kg)   SpO2 97% Comment: RA  BMI 36.22 kg/m  45 year old woman in no acute distress Alert and oriented x3 with no focal deficits Lungs diminished at right base Incisions clean dry and intact  Diagnostic Tests: I personally reviewed her chest x-ray images.  It shows further decrease in size of the right pleural effusion.  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  She did well initially postoperatively  and and went home soon after surgery.  She then presented back with some leg pain and shortness of breath.  Evaluation ruled out blood clots but she was found to have a complex loculated right pleural effusion.  A follow-up chest x-ray couple days later showed the effusion had improved.  She was treated with a steroid taper and now returns for additional follow-up.  She feels better than she did a week ago, and her chest x-ray is improved from a week ago.  There is much better aeration of the right lower lobe.  There is still some loculated fluid particularly in the fissure.  We discussed short-term follow-up versus another round of steroids.  She did have some hyperglycemia and would prefer to avoid another prednisone taper if possible.  Plan: Return in 2 weeks  with PA and lateral chest x-ray  [name], MD [facility] [phone]

## 2020-12-17 NOTE — Telephone Encounter (Signed)
Transition Care Management Follow-up Telephone Call Date of discharge and from where: 12/03/2020  Barbara Clark How have you been since you were released from the hospital? Continues to have pain but doing well./ Saw MD today Any questions or concerns? No  Items Reviewed: Did the pt receive and understand the discharge instructions provided? Yes  Medications obtained and verified? Yes  Other? No  Any new allergies since your discharge? No  Dietary orders reviewed? No Do you have support at home? Yes   Home Care and Equipment/Supplies: Were home health services ordered? not applicable If so, what is the name of the agency?  Has the agency set up a time to come to the patient's home? not applicable Were any new equipment or medical supplies ordered?  No What is the name of the medical supply agency?  Were you able to get the supplies/equipment? not applicable Do you have any questions related to the use of the equipment or supplies? No  Functional Questionnaire: (I = Independent and D = Dependent) ADLs: I  Bathing/Dressing- I  Meal Prep- I  Eating- I  Maintaining continence- I  Transferring/Ambulation- I  Managing Meds- I  Follow up appointments reviewed:  PCP Hospital f/u appt confirmed? No   Specialist Hospital f/u appt confirmed? Yes  Scheduled to see Pulmonary on today  Are transportation arrangements needed? No  If their condition worsens, is the pt aware to call PCP or go to the Emergency Dept.? Yes Was the patient provided with contact information for the PCP's office or ED? Yes Was to pt encouraged to call back with questions or concerns? Yes  Barbara Rand, RN, BSN, CEN [facility] [employer] [phone]

## 2020-12-30 ENCOUNTER — Other Ambulatory Visit: Payer: Self-pay | Admitting: Thoracic Surgery (Cardiothoracic Vascular Surgery)

## 2020-12-30 DIAGNOSIS — R222 Localized swelling, mass and lump, trunk: Secondary | ICD-10-CM

## 2020-12-31 ENCOUNTER — Other Ambulatory Visit: Payer: Self-pay

## 2020-12-31 ENCOUNTER — Ambulatory Visit
Admission: RE | Admit: 2020-12-31 | Discharge: 2020-12-31 | Disposition: A | Discharge status: Home / Self Care | Payer: Medicaid Other | Source: Ambulatory Visit | Attending: Thoracic Surgery (Cardiothoracic Vascular Surgery) | Admitting: Thoracic Surgery (Cardiothoracic Vascular Surgery)

## 2020-12-31 ENCOUNTER — Ambulatory Visit (INDEPENDENT_AMBULATORY_CARE_PROVIDER_SITE_OTHER): Payer: Self-pay | Admitting: Thoracic Surgery (Cardiothoracic Vascular Surgery)

## 2020-12-31 VITALS — BP 133/75 | HR 98 | Resp 20 | Ht 67.0 in | Wt 233.0 lb

## 2020-12-31 DIAGNOSIS — J90 Pleural effusion, not elsewhere classified: Secondary | ICD-10-CM

## 2020-12-31 DIAGNOSIS — Z9889 Other specified postprocedural states: Secondary | ICD-10-CM

## 2020-12-31 DIAGNOSIS — R222 Localized swelling, mass and lump, trunk: Secondary | ICD-10-CM

## 2020-12-31 NOTE — Progress Notes (Signed)
[address]       [place] [address]             [phone]     HPI: Barbara Clark returns for a scheduled follow-up visit  Barbara Clark is a 45 year old woman with a history of type 1 diabetes, hypertension, anxiety, depression, and sleep apnea.  She was found to have a chest wall mass in December 2021.  This was a fat density mass between the fourth and fifth ribs.  MRI showed no change in size but there were internal septations concerning for a low-grade liposarcoma.  We did a combined intrapleural and extrapleural resection of the mass on 11/28/2020.  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  On 11/18 she was in the emergency room with shortness of breath.  She had a CT to rule out PE.  There was no pulmonary embolus but she did have a loculated pleural effusion.  I treated her with a steroid taper.  On a follow-up chest x-ray on 1129 there was improvement of the effusion but there was still some loculated fluid in the fissure and a small amount of fluid posteriorly.  She had had issues with hyperglycemia with the prednisone taper so we decided to just check another x-ray today.  She feels well.  She is not having any problems with her breathing.  She does have some incisional discomfort but overall that is improved as well.  Past Medical History:  Diagnosis Date   Abnormality of gait 12/26/2012   Anxiety    Chronic leg pain 04/27/2014   Depression    Diabetes mellitus    Type I   DKA (diabetic ketoacidosis) ([place]) 09/03/2017   Dyspnea    HSV-2 (herpes simplex virus 2) infection 2012   serology   Hypertension    Limb weakness 08/04/2013   Menorrhagia 04/27/2014   PONV (postoperative nausea and vomiting)    Right leg weakness 02/20/2014   Sleep apnea 10/15/2020   Stuttering 02/02/2013    Current Outpatient Medications  Medication Sig Dispense Refill   ACCU-CHEK GUIDE test strip      atorvastatin (LIPITOR) 10 MG tablet TAKE 1 TABLET BY MOUTH EVERYDAY AT  BEDTIME 90 tablet 2   Cholecalciferol (VITAMIN D) 50 MCG (2000 UT) tablet Take 4,000 Units by mouth daily. Gummies     insulin aspart (NOVOLOG) 100 UNIT/ML injection Inject 0-100 Units into the skin continuous. Via pump     Insulin Human (INSULIN PUMP) SOLN Inject into the skin. Patient uses novolog 1.6 units/hr in insulin pump.     levonorgestrel (MIRENA) 20 MCG/DAY IUD 1 each by Intrauterine route once.     losartan (COZAAR) 25 MG tablet TAKE 1 TABLET BY MOUTH EVERYDAY AT BEDTIME 90 tablet 1   Multiple Vitamin (MULTIVITAMIN) capsule Take 1 capsule by mouth daily.     pregabalin (LYRICA) 25 MG capsule Take 1 capsule (25 mg total) by mouth 2 (two) times daily. 60 capsule 1   vitamin B-12 (CYANOCOBALAMIN) 500 MCG tablet Take 1,000 mcg by mouth daily. Gummies     No current facility-administered medications for this visit.    Physical Exam BP 133/75 (BP Location: Right Arm, Patient Position: Sitting)    Pulse 98    Resp 20    Ht 5\' 7"  (1.702 m)    Wt 233 lb (105.7 kg)    LMP 12/15/2020 (Within Days)    SpO2 96% Comment: RA   BMI 36.49 kg/m  45 year old woman in no acute distress Alert and oriented x3 with no focal deficits Lungs diminished at right base but otherwise clear Incisions well-healed  Diagnostic Tests: I personally reviewed her chest x-ray.  There is no change in the effusion.  There is a small posterior component as well as some fluid loculated in the fissure anterior laterally.  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  She developed a loculated pleural effusion.  She was treated with a steroid taper with a significant improvement in the effusion.  There was still some fluid loculated in the fissure as well as a small posterior component.  Now at 2 weeks he still has the fluid it really has not changed.  She is doing well symptomatically.  We discussed  the options of observation versus prednisone taper versus a lower dose of prednisone over a longer period of time.  There is not enough fluid to warrant thoracentesis.  We decided to continue with observation.  Plan: Return in 1 month with PA and lateral chest x-ray  [name], MD [facility] [phone]

## 2021-01-08 ENCOUNTER — Ambulatory Visit (INDEPENDENT_AMBULATORY_CARE_PROVIDER_SITE_OTHER): Payer: Medicaid Other

## 2021-01-08 ENCOUNTER — Other Ambulatory Visit: Payer: Self-pay

## 2021-01-08 DIAGNOSIS — Z23 Encounter for immunization: Secondary | ICD-10-CM | POA: Diagnosis not present

## 2021-01-21 ENCOUNTER — Other Ambulatory Visit: Payer: Self-pay | Admitting: Thoracic Surgery (Cardiothoracic Vascular Surgery)

## 2021-01-21 DIAGNOSIS — R222 Localized swelling, mass and lump, trunk: Secondary | ICD-10-CM

## 2021-01-22 ENCOUNTER — Other Ambulatory Visit: Payer: Self-pay

## 2021-01-22 ENCOUNTER — Encounter (HOSPITAL_COMMUNITY): Payer: Self-pay | Admitting: Emergency Medicine

## 2021-01-22 ENCOUNTER — Emergency Department (HOSPITAL_COMMUNITY)
Admission: EM | Admit: 2021-01-22 | Discharge: 2021-01-23 | Disposition: A | Discharge status: Home / Self Care | Payer: Medicaid Other | Attending: Emergency Medicine | Admitting: Emergency Medicine

## 2021-01-22 DIAGNOSIS — N3000 Acute cystitis without hematuria: Secondary | ICD-10-CM | POA: Insufficient documentation

## 2021-01-22 DIAGNOSIS — Z794 Long term (current) use of insulin: Secondary | ICD-10-CM | POA: Insufficient documentation

## 2021-01-22 DIAGNOSIS — D72829 Elevated white blood cell count, unspecified: Secondary | ICD-10-CM | POA: Diagnosis not present

## 2021-01-22 DIAGNOSIS — I10 Essential (primary) hypertension: Secondary | ICD-10-CM | POA: Diagnosis not present

## 2021-01-22 DIAGNOSIS — Z79899 Other long term (current) drug therapy: Secondary | ICD-10-CM | POA: Insufficient documentation

## 2021-01-22 DIAGNOSIS — E1040 Type 1 diabetes mellitus with diabetic neuropathy, unspecified: Secondary | ICD-10-CM | POA: Insufficient documentation

## 2021-01-22 DIAGNOSIS — R202 Paresthesia of skin: Secondary | ICD-10-CM | POA: Insufficient documentation

## 2021-01-22 DIAGNOSIS — M79604 Pain in right leg: Secondary | ICD-10-CM | POA: Diagnosis present

## 2021-01-22 DIAGNOSIS — Z20822 Contact with and (suspected) exposure to covid-19: Secondary | ICD-10-CM | POA: Diagnosis not present

## 2021-01-22 LAB — CBG MONITORING, ED: Glucose-Capillary: 178 mg/dL — ABNORMAL HIGH (ref 70–99)

## 2021-01-22 NOTE — ED Provider Triage Note (Signed)
Emergency Medicine Provider Triage Evaluation Note  Barbara Clark , a 46 y.o. female  was evaluated in triage.  Pt complains of loss of sensation to her right lower leg.  Patient states she was sitting on her sofa when she noticed numbness to her right lower leg.  For started in her popliteal area and then seem to move down the leg and to just above the knee.  Patient tried walking around however has difficulty with walking, tried using her stationary bike but this also did not really resolve her symptoms.  Patient reports having a mass removed from her right chest wall in November, called her surgeon's office who did not feel it was related.  States that her insulin pump recently fell off and her blood sugar was as high as 500, called her diabetes doctor who felt it was not related to her diabetes.  Patient called her primary care who deferred her to the ER.  She denies falls or injuries, denies any pain radiating from her back.  Review of Systems  Positive: Numbness to leg Negative: Back pain  Physical Exam  BP (!) 145/78 (BP Location: Right Arm)    Pulse 95    Temp 99.2 F (37.3 C) (Oral)    Resp 16    Ht 5\' 7"  (1.702 m)    Wt 106.6 kg    SpO2 96%    BMI 36.81 kg/m  Gen:   Awake, no distress   Resp:  Normal effort  MSK:   Moves extremities without difficulty  Other:  DP pulse present.  Reports total loss of sensation to the anterior and lateral compartment of the right lower leg.  Sensation reportedly normal to the medial and posterior compartments.  Medical Decision Making  Medically screening exam initiated at 5:31 PM.  Appropriate orders placed.  [name] was informed that the remainder of the evaluation will be completed by another provider, this initial triage assessment does not replace that evaluation, and the importance of remaining in the ED until their evaluation is complete.     Tacy Learn, PA-C 01/22/21 1746

## 2021-01-22 NOTE — ED Triage Notes (Signed)
Reports the back of her R leg, popliteal area, is numb for the past few hours, it has progressed down to her R foot. States her insulin pump has fallen off. PCP recommended ED. Denies back pain. Denies swelling, or redness. Took a gabapentin w/o relief, tried hot and cold compress as well.

## 2021-01-23 ENCOUNTER — Ambulatory Visit (HOSPITAL_COMMUNITY)
Admission: RE | Admit: 2021-01-23 | Discharge: 2021-01-23 | Disposition: A | Discharge status: Home / Self Care | Payer: Medicaid Other | Source: Ambulatory Visit | Attending: Physician Assistant | Admitting: Physician Assistant

## 2021-01-23 DIAGNOSIS — R609 Edema, unspecified: Secondary | ICD-10-CM | POA: Insufficient documentation

## 2021-01-23 DIAGNOSIS — M79604 Pain in right leg: Secondary | ICD-10-CM

## 2021-01-23 DIAGNOSIS — M79606 Pain in leg, unspecified: Secondary | ICD-10-CM | POA: Insufficient documentation

## 2021-01-23 DIAGNOSIS — M7989 Other specified soft tissue disorders: Secondary | ICD-10-CM

## 2021-01-23 LAB — RAPID URINE DRUG SCREEN, HOSP PERFORMED
Amphetamines: NOT DETECTED
Barbiturates: NOT DETECTED
Benzodiazepines: NOT DETECTED
Cocaine: NOT DETECTED
Opiates: NOT DETECTED
Tetrahydrocannabinol: NOT DETECTED

## 2021-01-23 LAB — CBC WITH DIFFERENTIAL/PLATELET
Abs Immature Granulocytes: 0.02 10*3/uL (ref 0.00–0.07)
Basophils Absolute: 0.1 10*3/uL (ref 0.0–0.1)
Basophils Relative: 1 %
Eosinophils Absolute: 0.2 10*3/uL (ref 0.0–0.5)
Eosinophils Relative: 2 %
HCT: 35 % — ABNORMAL LOW (ref 36.0–46.0)
Hemoglobin: 11.5 g/dL — ABNORMAL LOW (ref 12.0–15.0)
Immature Granulocytes: 0 %
Lymphocytes Relative: 44 %
Lymphs Abs: 4.7 10*3/uL — ABNORMAL HIGH (ref 0.7–4.0)
MCH: 29.9 pg (ref 26.0–34.0)
MCHC: 32.9 g/dL (ref 30.0–36.0)
MCV: 91.1 fL (ref 80.0–100.0)
Monocytes Absolute: 0.7 10*3/uL (ref 0.1–1.0)
Monocytes Relative: 7 %
Neutro Abs: 4.9 10*3/uL (ref 1.7–7.7)
Neutrophils Relative %: 46 %
Platelets: 452 10*3/uL — ABNORMAL HIGH (ref 150–400)
RBC: 3.84 MIL/uL — ABNORMAL LOW (ref 3.87–5.11)
RDW: 13.9 % (ref 11.5–15.5)
WBC: 10.6 10*3/uL — ABNORMAL HIGH (ref 4.0–10.5)
nRBC: 0 % (ref 0.0–0.2)

## 2021-01-23 LAB — COMPREHENSIVE METABOLIC PANEL
ALT: 14 U/L (ref 0–44)
AST: 14 U/L — ABNORMAL LOW (ref 15–41)
Albumin: 3.6 g/dL (ref 3.5–5.0)
Alkaline Phosphatase: 124 U/L (ref 38–126)
Anion gap: 5 (ref 5–15)
BUN: 12 mg/dL (ref 6–20)
CO2: 22 mmol/L (ref 22–32)
Calcium: 8.6 mg/dL — ABNORMAL LOW (ref 8.9–10.3)
Chloride: 110 mmol/L (ref 98–111)
Creatinine, Ser: 0.6 mg/dL (ref 0.44–1.00)
GFR, Estimated: >60 mL/min (ref >60)
Glucose, Bld: 152 mg/dL — ABNORMAL HIGH (ref 70–99)
Potassium: 3.8 mmol/L (ref 3.5–5.1)
Sodium: 137 mmol/L (ref 135–145)
Total Bilirubin: 0.4 mg/dL (ref 0.3–1.2)
Total Protein: 7.8 g/dL (ref 6.5–8.1)

## 2021-01-23 LAB — RESP PANEL BY RT-PCR (FLU A&B, COVID) ARPGX2
Influenza A by PCR: NEGATIVE
Influenza B by PCR: NEGATIVE
SARS Coronavirus 2 by RT PCR: NEGATIVE

## 2021-01-23 LAB — URINALYSIS, ROUTINE W REFLEX MICROSCOPIC
Bilirubin Urine: NEGATIVE
Glucose, UA: 50 mg/dL — AB
Hgb urine dipstick: NEGATIVE
Ketones, ur: 5 mg/dL — AB
Nitrite: POSITIVE — AB
Protein, ur: 30 mg/dL — AB
Specific Gravity, Urine: 1.031 — ABNORMAL HIGH (ref 1.005–1.030)
pH: 5 (ref 5.0–8.0)

## 2021-01-23 LAB — HCG, QUANTITATIVE, PREGNANCY: hCG, Beta Chain, Quant, S: <1 m[IU]/mL (ref <5)

## 2021-01-23 LAB — APTT: aPTT: 29 seconds (ref 24–36)

## 2021-01-23 LAB — PROTIME-INR
INR: 1.2 (ref 0.8–1.2)
Prothrombin Time: 14.9 seconds (ref 11.4–15.2)

## 2021-01-23 MED ORDER — NITROFURANTOIN MONOHYD MACRO 100 MG PO CAPS: 100.0000 mg | ORAL_CAPSULE | Freq: Two times a day (BID) | ORAL | 0 refills | Status: DC

## 2021-01-23 NOTE — Discharge Instructions (Addendum)
Please follow the instructions for a DVT study.  If you develop fevers, have any new or concerning symptoms please seek additional medical care and evaluation. Your urine did look infected.  I have sent a prescription for Macrobid to your pharmacy.  You may have diarrhea from the antibiotics.  It is very important that you continue to take the antibiotics even if you get diarrhea unless a medical professional tells you that you may stop taking them.  If you stop too early the bacteria you are being treated for will become stronger and you may need different, more powerful antibiotics that have more side effects and worsening diarrhea.  Please stay well hydrated and consider probiotics as they may decrease the severity of your diarrhea.  Please be aware that if you take any hormonal contraception (birth control pills, nexplanon, the ring, etc) that your birth control will not work while you are taking antibiotics and you need to use back up protection as directed on the birth control medication information insert.

## 2021-01-23 NOTE — Progress Notes (Signed)
Right lower extremity venous duplex completed. Refer to "CV Proc" under chart review to view preliminary results.  01/23/2021 11:32 AM Kelby Aline., MHA, RVT, RDCS, RDMS

## 2021-01-23 NOTE — ED Provider Notes (Signed)
[facility] DEPT Provider Note   CSN: [id number] Arrival date & time: 01/22/21  1706     History  Chief Complaint  Patient presents with   Leg Pain    Barbara Clark is a 46 y.o. female with a past medical history of DM1, diabetic neuropathy, right chest wall mass status post excision, OSA, right leg weakness, hypertension, who presents today for evaluation of numbness in her right leg.  She states that this started at about 2 PM this afternoon.  It started as numbness in her right calf, and then spread down her leg to involve her foot and up to her mid thigh.  She tried changing positions and ambulating without any relief.  She states that it did briefly also happen in the left leg but that quickly resolved.  She states that she has currently had resolution of the right leg numbness however has pain in the right calf and in the right heel.  She states that she has been limping due to this.  She denies any recent fevers. She did have surgery back on 11/28/2020 and was admitted in the hospital.  She reports that she suspects that some of this may be due to her insulin pump becoming dislodged yesterday which resulted in her sugars being in the 500s.  He states that she has since fixed her issues with the insulin pump and her sugars have been improving.  HPI     Home Medications Prior to Admission medications   Medication Sig Start Date End Date Taking? Authorizing Provider  nitrofurantoin, macrocrystal-monohydrate, (MACROBID) 100 MG capsule Take 1 capsule (100 mg total) by mouth 2 (two) times daily. 01/23/21  Yes Lorin Glass, PA-C  ACCU-CHEK GUIDE test strip  09/08/19   [provider]  atorvastatin (LIPITOR) 10 MG tablet TAKE 1 TABLET BY MOUTH EVERYDAY AT BEDTIME 06/26/20   [name], MD  Cholecalciferol (VITAMIN D) 50 MCG (2000 UT) tablet Take 4,000 Units by mouth daily. Gummies    [provider]  insulin aspart (NOVOLOG)  100 UNIT/ML injection Inject 0-100 Units into the skin continuous. Via pump 06/14/18   [provider]  Insulin Human (INSULIN PUMP) SOLN Inject into the skin. Patient uses novolog 1.6 units/hr in insulin pump.    [provider]  levonorgestrel (MIRENA) 20 MCG/DAY IUD 1 each by Intrauterine route once.    [provider]  losartan (COZAAR) 25 MG tablet TAKE 1 TABLET BY MOUTH EVERYDAY AT BEDTIME 11/18/20   Andrena Mews T, MD  Multiple Vitamin (MULTIVITAMIN) capsule Take 1 capsule by mouth daily.    [provider]  pregabalin (LYRICA) 25 MG capsule Take 1 capsule (25 mg total) by mouth 2 (two) times daily. 12/03/20   Antony Odea, PA-C  vitamin B-12 (CYANOCOBALAMIN) 500 MCG tablet Take 1,000 mcg by mouth daily. Gummies    [provider]      Allergies    Patient has no known allergies.    Review of Systems   Review of Systems  Constitutional:  Negative for chills and fever.  HENT:  Negative for congestion.   Respiratory:  Negative for chest tightness and shortness of breath.   Cardiovascular:  Negative for leg swelling.  Gastrointestinal:  Negative for abdominal pain and nausea.  Musculoskeletal:  Negative for back pain and neck pain.  Skin:  Negative for color change, rash and wound.  Neurological:  Positive for weakness and numbness. Negative for headaches.  All other systems  reviewed and are negative.  Physical Exam Updated Vital Signs BP 105/90    Pulse 89    Temp 99.2 F (37.3 C) (Oral)    Resp 18    Ht 5\' 7"  (1.702 m)    Wt 106.6 kg    SpO2 100%    BMI 36.81 kg/m  Physical Exam Vitals and nursing note reviewed.  Constitutional:      General: She is not in acute distress.    Appearance: She is not diaphoretic.  HENT:     Head: Normocephalic and atraumatic.  Eyes:     General: No scleral icterus.       Right eye: No discharge.        Left eye: No discharge.     Conjunctiva/sclera: Conjunctivae normal.   Cardiovascular:     Rate and Rhythm: Normal rate and regular rhythm.     Pulses: Normal pulses.     Comments: 2+ dp/pt pulses bilaterally Pulmonary:     Effort: Pulmonary effort is normal. No respiratory distress.     Breath sounds: No stridor.  Abdominal:     General: There is no distension.     Tenderness: There is no abdominal tenderness.  Musculoskeletal:        General: No deformity.     Cervical back: Normal range of motion and neck supple.     Right lower leg: No edema.     Left lower leg: No edema.  Skin:    General: Skin is warm and dry.  Neurological:     Mental Status: She is alert.     Motor: No abnormal muscle tone.     Comments: Patient is awake and alert.  Speech is not slurred.  Answers questions appropriately without difficulty. Smile is symmetric as are facial movements.  Sensation intact to light touch to bilateral legs. She has intact strength to left leg.  Right leg with decreased strength through dorsiflexion and plantar flexion.  Right leg has preserved strength with knee flexion extension and hip flexion extension.  Patient is able to walk on her toes and ambulate without difficulty.  Due to pain in the right heel patient declines attempt to walk on her heels.   Psychiatric:        Mood and Affect: Mood normal.        Behavior: Behavior normal.    ED Results / Procedures / Treatments   Labs (all labs ordered are listed, but only abnormal results are displayed) Labs Reviewed  COMPREHENSIVE METABOLIC PANEL - Abnormal; Notable for the following components:      Result Value   Glucose, Bld 152 (*)    Calcium 8.6 (*)    AST 14 (*)    All other components within normal limits  URINALYSIS, ROUTINE W REFLEX MICROSCOPIC - Abnormal; Notable for the following components:   APPearance HAZY (*)    Specific Gravity, Urine 1.031 (*)    Glucose, UA 50 (*)    Ketones, ur 5 (*)    Protein, ur 30 (*)    Nitrite POSITIVE (*)    Leukocytes,Ua TRACE (*)    Bacteria,  UA MANY (*)    All other components within normal limits  CBC WITH DIFFERENTIAL/PLATELET - Abnormal; Notable for the following components:   WBC 10.6 (*)    RBC 3.84 (*)    Hemoglobin 11.5 (*)    HCT 35.0 (*)    Platelets 452 (*)    Lymphs Abs 4.7 (*)  All other components within normal limits  CBG MONITORING, ED - Abnormal; Notable for the following components:   Glucose-Capillary 178 (*)    All other components within normal limits  RESP PANEL BY RT-PCR (FLU A&B, COVID) ARPGX2  URINE CULTURE  PROTIME-INR  APTT  RAPID URINE DRUG SCREEN, HOSP PERFORMED  HCG, QUANTITATIVE, PREGNANCY    EKG EKG Interpretation  Date/Time:  Thursday January 23 2021 01:01:13 EST Ventricular Rate:  90 PR Interval:  171 QRS Duration: 82 QT Interval:  358 QTC Calculation: 438 R Axis:   67 Text Interpretation: Sinus rhythm Probable left atrial enlargement Low voltage, precordial leads Borderline T abnormalities, inferior leads Confirmed by Quintella Reichert [id number]) on 01/23/2021 1:42:51 AM  Radiology No results found.  Procedures Procedures    Medications Ordered in ED Medications - No data to display  ED Course/ Medical Decision Making/ A&P                           Medical Decision Making   This patient presents to the ED for concern of right leg numbness, this involves an extensive number of treatment options, and is a complaint that carries with it a high risk of complications and morbidity.  The differential diagnosis includes diabetic neuropathy, radiculopathy, nerve impingement, electrolyte derangements, DVT, ischemia, stroke.    Co morbidities that complicate the patient evaluation  DM1, Chronic right leg weakness, diabetic neuropathy   Lab Tests:  I Ordered, and personally interpreted labs.  The pertinent results include: Leukocytosis which appears baseline, hemoglobin is slightly low at which is also baseline.  CMP without evidence of DKA or significant acute electrolyte  derangements.  Pregnancy test is negative, UDS is negative.  UA does show concern for infection as it is nitrite positive, and microscopy showing 0-5 reds, 6-10 whites with many bacteria and only 0-5 squamous epithelial cells.  Urine culture is ordered.  Test Considered:  Imaging of brain, right leg or L-spine.  While patient does have some weakness she reports that this is chronic and is unchanged. DVT study is considered, however is unavailable at the time of my evaluation.  Patient is scheduled for DVT study return in the morning.   Problem List / ED Course:  Resolved right leg numbness, Right leg pain, acute cystitis  Dispostion:  After consideration of the diagnostic results and the patients response to treatment, I feel that the patent would benefit from discharge home with DVT study.  She was given the option to remain in the department until this was available in the morning however she declined this stating she wished to go home.  Additionally will patient is not reporting significant urinary symptoms her urine is concerning for infection, it is nitrite positive with microscopy showing many bacteria along with red blood cells and white blood cells present and appears to be a clean-catch. Given patient's underlying comorbid conditions including DM 1 we will treat with Macrobid.  Urine culture is ordered.  She does not meet SIRS/sepsis criteria and is generally well-appearing.  Return precautions were discussed with patient who states their understanding.  At the time of discharge patient denied any unaddressed complaints or concerns.  Patient is agreeable for discharge home.  Note: Portions of this report may have been transcribed using voice recognition software. Every effort was made to ensure accuracy; however, inadvertent computerized transcription errors may be present   Final Clinical Impression(s) / ED Diagnoses Final diagnoses:  Right leg pain  Acute cystitis without  hematuria    Rx / DC Orders ED Discharge Orders          Ordered    nitrofurantoin, macrocrystal-monohydrate, (MACROBID) 100 MG capsule  2 times daily        01/23/21 0314    [facility]        01/23/21 0318              Lorin Glass, PA-C 01/23/21 1740    Quintella Reichert, MD 01/24/21 [phone]

## 2021-01-25 LAB — URINE CULTURE: Culture: >=100000 — AB

## 2021-01-26 ENCOUNTER — Telehealth: Payer: Self-pay | Admitting: Emergency Medicine

## 2021-01-26 DIAGNOSIS — E1069 Type 1 diabetes mellitus with other specified complication: Secondary | ICD-10-CM

## 2021-01-26 NOTE — Telephone Encounter (Signed)
Post ED Visit - Positive Culture Follow-up  Culture report reviewed by antimicrobial stewardship pharmacist: [facility] Team []  Elenor Quinones, Pharm.D. []  Heide Guile, Pharm.D., BCPS AQ-ID []  Parks Neptune, Pharm.D., BCPS []  Alycia Rossetti, Pharm.D., BCPS []  [place], [place].D., BCPS, AAHIVP []  Legrand Como, Pharm.D., BCPS, AAHIVP []  Salome Arnt, PharmD, BCPS []  Johnnette Gourd, PharmD, BCPS []  Hughes Better, PharmD, BCPS []  Leeroy Cha, PharmD []  Laqueta Linden, PharmD, BCPS []  Albertina Parr, PharmD  [facility] Team []  Leodis Sias, PharmD []  Lindell Spar, PharmD []  Royetta Asal, PharmD []  Graylin Shiver, Rph []  Rema Fendt) Glennon Mac, PharmD []  Arlyn Dunning, PharmD []  Netta Cedars, PharmD []  Dia Sitter, PharmD []  Leone Haven, PharmD []  Gretta Arab, PharmD [x]  Theodis Shove, PharmD []  Peggyann Juba, PharmD []  Reuel Boom, PharmD   Positive urine culture Treated with Nitrofurantoin, organism sensitive to the same and no further patient follow-up is required at this time.  Sandi Raveling Tamarick Kovalcik 01/26/2021, 1:59 PM

## 2021-01-28 ENCOUNTER — Ambulatory Visit: Payer: Medicaid Other | Admitting: Thoracic Surgery (Cardiothoracic Vascular Surgery)

## 2021-01-29 ENCOUNTER — Ambulatory Visit (INDEPENDENT_AMBULATORY_CARE_PROVIDER_SITE_OTHER): Payer: Medicaid Other | Admitting: Family Medicine

## 2021-01-29 ENCOUNTER — Other Ambulatory Visit: Payer: Self-pay

## 2021-01-29 ENCOUNTER — Encounter: Payer: Self-pay | Admitting: Family Medicine

## 2021-01-29 VITALS — BP 130/89 | HR 96 | Ht 67.0 in | Wt 233.2 lb

## 2021-01-29 DIAGNOSIS — R222 Localized swelling, mass and lump, trunk: Secondary | ICD-10-CM | POA: Diagnosis not present

## 2021-01-29 DIAGNOSIS — G629 Polyneuropathy, unspecified: Secondary | ICD-10-CM | POA: Insufficient documentation

## 2021-01-29 DIAGNOSIS — F339 Major depressive disorder, recurrent, unspecified: Secondary | ICD-10-CM | POA: Diagnosis not present

## 2021-01-29 DIAGNOSIS — E889 Metabolic disorder, unspecified: Secondary | ICD-10-CM | POA: Diagnosis not present

## 2021-01-29 DIAGNOSIS — R202 Paresthesia of skin: Secondary | ICD-10-CM | POA: Diagnosis present

## 2021-01-29 DIAGNOSIS — E1040 Type 1 diabetes mellitus with diabetic neuropathy, unspecified: Secondary | ICD-10-CM

## 2021-01-29 DIAGNOSIS — Z23 Encounter for immunization: Secondary | ICD-10-CM | POA: Diagnosis not present

## 2021-01-29 DIAGNOSIS — Z113 Encounter for screening for infections with a predominantly sexual mode of transmission: Secondary | ICD-10-CM

## 2021-01-29 DIAGNOSIS — E108 Type 1 diabetes mellitus with unspecified complications: Secondary | ICD-10-CM

## 2021-01-29 DIAGNOSIS — Z114 Encounter for screening for human immunodeficiency virus [HIV]: Secondary | ICD-10-CM | POA: Diagnosis not present

## 2021-01-29 NOTE — Progress Notes (Signed)
° ° °  SUBJECTIVE:   CHIEF COMPLAINT / HPI:   Paresthesia: She was recently seen in the hospital for right calf numbness and tingling, which gradually spread to her LL. She denies swelling or redness. No trauma to her LL. She was started on Lyrica with improvement in her numbness and tingling. She had doppler US done on 01/23/21 to r/o DVT, which was negative. She denies any LL weakness, no UL weakness, or aphasia.  DM2: She is compliant with her endocrinology f/u as well as an insulin pump. She gets her A1C checked with her endocrinologist. She is uncertain if she had a recent FLP checked and is ok with getting that checked today.  Lung mass: S/P surgical removal. She denies SOB or chest pain. Pulm f/u in tomorrow.  UTI:  Completed Macrobid treatment prescribed by the ED. She denies ever having UTI symptoms. Feels well in general.   PERTINENT  PMH / PSH: PMHx reviewed  OBJECTIVE:   BP 130/89    Pulse 96    Ht 5\' 7"  (1.702 m)    Wt 233 lb 4 oz (105.8 kg)    SpO2 100%    BMI 36.53 kg/m   Physical Exam Vitals and nursing note reviewed.  Cardiovascular:     Rate and Rhythm: Normal rate and regular rhythm.     Heart sounds: Normal heart sounds. No murmur heard. Pulmonary:     Effort: Pulmonary effort is normal. No respiratory distress.     Breath sounds: Normal breath sounds. No wheezing.  Abdominal:     General: Abdomen is flat. Bowel sounds are normal. There is no distension.     Palpations: Abdomen is soft. There is no mass.     Tenderness: There is no abdominal tenderness.  Musculoskeletal:     Right lower leg: No edema.     Left lower leg: No edema.     Comments: No calf swelling or tenderness  Neurological:     General: No focal deficit present.     Mental Status: She is oriented to person, place, and time.     Sensory: Sensation is intact.     Motor: Motor function is intact.     Coordination: Coordination is intact.     Gait: Gait is intact.     Deep Tendon Reflexes:  Reflexes are normal and symmetric.   [facility] Office Visit from 01/29/2021 in [place]  PHQ-9 Total Score 8        ASSESSMENT/PLAN:   Neuropathy Similar episode in the past. No gait abnormality and CVA unlikely. 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 Doppler US reviewed and is negative for DVT. TSH, Vit B12, HIV and RPR checked. Continue Lyrica since there has been some improvement - Lyrica 25 mg BID. Consider neurology referral in the future. ED precaution discussed.   Diabetes mellitus type I ([place]) A1C with endo FLP checked today. I will contact her with result soon.   Mass of right chest wall S/P resection. Surgical report reviewed. F/U Pulm as planned.   Abnormal urine culture - likely asymptomatic bacteriuria. F/U as needed. Andrena Mews, MD [place]

## 2021-01-29 NOTE — Assessment & Plan Note (Signed)
Stable

## 2021-01-29 NOTE — Assessment & Plan Note (Signed)
S/P resection. Surgical report reviewed. F/U Pulm as planned.

## 2021-01-29 NOTE — Assessment & Plan Note (Signed)
Similar episode in the past. No gait abnormality and CVA unlikely. 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 Doppler US reviewed and is negative for DVT. TSH, Vit B12, HIV and RPR checked. Continue Lyrica since there has been some improvement - Lyrica 25 mg BID. Consider neurology referral in the future. ED precaution discussed.

## 2021-01-29 NOTE — Patient Instructions (Signed)
Paresthesia Paresthesia is a burning or prickling feeling. This feeling can happen in any part of the body. It often happens in the hands, arms, legs, or feet. Usually, it is not painful. In most cases, the feeling goes away in a short time and is not a sign of a serious problem. If you have paresthesia that lasts a long time, you need to see your doctor. Follow these instructions at home: Nutrition Eat a healthy diet. This includes: Eating foods that are high in fiber. These include beans, whole grains, and fresh fruits and vegetables. Limiting foods that are high in fat and sugar. These include fried or sweet foods.  Alcohol use  Do not drink alcohol if: Your doctor tells you not to drink. You are pregnant, may be pregnant, or are planning to become pregnant. If you drink alcohol: Limit how much you have to: 0-1 drink a day for women. 0-2 drinks a day for men. Know how much alcohol is in your drink. In the [place] one drink equals one 12 oz bottle of beer (355 mL), one 5 oz glass of wine (148 mL), or one 1 oz glass of hard liquor (44 mL). General instructions Take over-the-counter and prescription medicines only as told by your doctor. Do not smoke or use any products that contain nicotine or tobacco. If you need help quitting, ask your doctor. If you have diabetes, work with your doctor to make sure your blood sugar stays in a healthy range. If your feet feel numb: Check for redness, warmth, and swelling every day. Wear padded socks and comfortable shoes. These help protect your feet. Keep all follow-up visits. Contact a doctor if: You have paresthesia that gets worse or does not go away. You lose feeling (have numbness) after an injury. Your burning or prickling feeling gets worse when you walk. You have pain or cramps. You feel dizzy or you faint. You have a rash. Get help right away if: You feel weak or have new weakness in an arm or leg. You have trouble walking or  moving. You have problems speaking, understanding, or seeing. You feel confused. You cannot control when you pee (urinate) or poop (have a bowel movement). These symptoms may be an emergency. Get help right away. Call 911. Do not wait to see if the symptoms will go away. Do not drive yourself to the hospital. Summary Paresthesia is a burning or prickling feeling. It often happens in the hands, arms, legs, or feet. In most cases, the feeling goes away in a short time and is not a sign of a serious problem. If you have paresthesia that lasts a long time, you need to be seen by your doctor. This information is not intended to replace advice given to you by your health care provider. Make sure you discuss any questions you have with your health care provider. Document Revised: 09/16/2020 Document Reviewed: 09/16/2020 Elsevier Patient Education  [place].

## 2021-01-29 NOTE — Assessment & Plan Note (Signed)
A1C with endo FLP checked today. I will contact her with result soon.

## 2021-01-30 ENCOUNTER — Telehealth: Payer: Self-pay | Admitting: Family Medicine

## 2021-01-30 ENCOUNTER — Ambulatory Visit (INDEPENDENT_AMBULATORY_CARE_PROVIDER_SITE_OTHER): Payer: Self-pay | Admitting: Thoracic Surgery (Cardiothoracic Vascular Surgery)

## 2021-01-30 ENCOUNTER — Ambulatory Visit
Admission: RE | Admit: 2021-01-30 | Discharge: 2021-01-30 | Disposition: A | Discharge status: Home / Self Care | Payer: Medicaid Other | Source: Ambulatory Visit | Attending: Thoracic Surgery (Cardiothoracic Vascular Surgery) | Admitting: Thoracic Surgery (Cardiothoracic Vascular Surgery)

## 2021-01-30 ENCOUNTER — Encounter: Payer: Self-pay | Admitting: Family Medicine

## 2021-01-30 VITALS — BP 120/78 | HR 95 | Resp 20 | Ht 67.0 in | Wt 223.0 lb

## 2021-01-30 DIAGNOSIS — R222 Localized swelling, mass and lump, trunk: Secondary | ICD-10-CM

## 2021-01-30 DIAGNOSIS — J90 Pleural effusion, not elsewhere classified: Secondary | ICD-10-CM

## 2021-01-30 DIAGNOSIS — Z9889 Other specified postprocedural states: Secondary | ICD-10-CM

## 2021-01-30 LAB — LIPID PANEL
Chol/HDL Ratio: 3 ratio (ref 0.0–4.4)
Cholesterol, Total: 104 mg/dL (ref 100–199)
HDL: 35 mg/dL — ABNORMAL LOW (ref >39)
LDL Chol Calc (NIH): 58 mg/dL (ref 0–99)
Triglycerides: 43 mg/dL (ref 0–149)
VLDL Cholesterol Cal: 11 mg/dL (ref 5–40)

## 2021-01-30 LAB — TSH: TSH: 3.5 u[IU]/mL (ref 0.450–4.500)

## 2021-01-30 LAB — HIV ANTIBODY (ROUTINE TESTING W REFLEX): HIV Screen 4th Generation wRfx: NONREACTIVE

## 2021-01-30 LAB — VITAMIN B12: Vitamin B-12: 926 pg/mL (ref 232–1245)

## 2021-01-30 LAB — RPR: RPR Ser Ql: NONREACTIVE

## 2021-01-30 NOTE — Telephone Encounter (Signed)
Test results discussed.  She also mentioned she had muscle cramps overnight. She went to get bananas and she feels better.  I encouraged hydration. She is advised to contact me soon if there is no improvement. Consider muscle relaxant in the future if there is no improvement. She agreed with the plan.

## 2021-01-30 NOTE — Progress Notes (Signed)
[facility] [address]       Clark,Barbara Clark             [phone]     HPI: Barbara Clark returns for a scheduled follow-up visit  Barbara Clark is a 46 year old woman with history of type 1 diabetes, hypertension, anxiety, depression, and sleep apnea.  She had a combined intra intra and extrapleural resection of a chest wall mass on 11/28/2020.  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  A little over a week postoperatively she developed shortness of breath.  A CT was done to rule out PE.  There was no clot but she did have a loculated effusion.  We gave her steroid taper.  That caused some issues with her blood sugars but otherwise was well-tolerated.  She now returns for follow-up regarding that.  She has numbness in the right chest.  She complains of pain with coughing and sneezing.  She has not taking any narcotics.  Her primary problem recently has been leg cramps.  She is working with Dr. Gwendlyn Deutscher on that.  She did have a duplex to rule out DVT.  Past Medical History:  Diagnosis Date   Abnormality of gait 12/26/2012   Anxiety    Chronic leg pain 04/27/2014   Depression    Diabetes mellitus    Type I   DKA (diabetic ketoacidosis) ([facility]) 09/03/2017   Dyspnea    HSV-2 (herpes simplex virus 2) infection 2012   serology   Hypertension    Limb weakness 08/04/2013   Menorrhagia 04/27/2014   PONV (postoperative nausea and vomiting)    Right leg weakness 02/20/2014   Sleep apnea 10/15/2020   Stuttering 02/02/2013    Current Outpatient Medications  Medication Sig Dispense Refill   ACCU-CHEK GUIDE test strip      atorvastatin (LIPITOR) 10 MG tablet TAKE 1 TABLET BY MOUTH EVERYDAY AT BEDTIME 90 tablet 2   Cholecalciferol (VITAMIN D) 50 MCG (2000 UT) tablet Take 4,000 Units by mouth daily. Gummies     insulin aspart (NOVOLOG) 100 UNIT/ML injection Inject 0-100 Units into the skin continuous. Via pump     Insulin Human (INSULIN PUMP) SOLN Inject into the skin. Patient uses  novolog 1.6 units/hr in insulin pump.     levonorgestrel (MIRENA) 20 MCG/DAY IUD 1 each by Intrauterine route once.     losartan (COZAAR) 25 MG tablet TAKE 1 TABLET BY MOUTH EVERYDAY AT BEDTIME 90 tablet 1   Multiple Vitamin (MULTIVITAMIN) capsule Take 1 capsule by mouth daily.     nitrofurantoin, macrocrystal-monohydrate, (MACROBID) 100 MG capsule Take 1 capsule (100 mg total) by mouth 2 (two) times daily. 10 capsule 0   pregabalin (LYRICA) 25 MG capsule Take 1 capsule (25 mg total) by mouth 2 (two) times daily. 60 capsule 1   vitamin B-12 (CYANOCOBALAMIN) 500 MCG tablet Take 1,000 mcg by mouth daily. Gummies     No current facility-administered medications for this visit.    Physical Exam BP 120/78    Pulse 95    Resp 20    Ht 5\' 7"  (1.702 m)    Wt 223 lb (101.2 kg)    SpO2 96%    BMI 34.31 kg/m  46 year old woman in no acute distress Alert and oriented x3 with no focal deficits Incisions well-healed Lungs clear bilaterally  Diagnostic Tests: CHEST - 2 VIEW   COMPARISON:  Radiograph 12/31/2020, chest CT 12/06/2020   FINDINGS: Unchanged cardiomediastinal silhouette. There is a  possible persistent loculated right pleural effusion and adjacent basilar atelectasis. No pneumothorax.   IMPRESSION: Persistent small loculated right pleural effusion and adjacent basilar atelectasis.     Electronically Signed   By: Maurine Simmering M.D.   On: 01/30/2021 09:20 I personally reviewed the chest x-ray images.  There is been improvement in the loculated pleural effusion.  There is still some fluid in the fissure and a very small amount of fluid at the base.  No intervention indicated.  Impression: Barbara Clark is a 46 year old woman who had a combined intra and extrapleural resection of a chest wall mass that turned out to be a benign lipoma.  There was concern on her MR imaging that this might be a low-grade liposarcoma.  However, there was no evidence of malignancy on final pathology.  She  developed a pleural effusion postoperatively.  That has improved.  There is a small residual amount of fluid but nothing that would warrant intervention.  She had issues with hyperglycemia with her steroid taper previously so I would not do that again unless the fluid were to worsen.  There are no restrictions on her activities from my standpoint.  Plan: I will plan to see her back in 6 months with a PA and lateral chest x-ray. She knows to call if she has issues prior to that.  [name], MD [facility] [phone]

## 2021-01-31 ENCOUNTER — Encounter: Payer: Self-pay | Admitting: Family Medicine

## 2021-01-31 ENCOUNTER — Telehealth: Payer: Self-pay

## 2021-01-31 NOTE — Telephone Encounter (Signed)
Patient updated on ophthalmology referral.

## 2021-01-31 NOTE — Telephone Encounter (Signed)
Patient calls nurse line reporting she just missed a call from PCP. Will forward.

## 2021-01-31 NOTE — Telephone Encounter (Signed)
A HIPAA-compliant callback message was left.   Please advise her that I received her DM eye exam report from her ophthalmologist. However, there were reports from 2021. As discussed during her recent visit, I will refer her to an eye doctor if she has no recent visits.  Referral placed.

## 2021-01-31 NOTE — Telephone Encounter (Signed)
Please advised patient that I reviewed her ophthalmology report from 2021. Since she needs an updated eye exam, I have placed referral to an eye doctor. Advise her to contact their office for an appointment. Thanks.

## 2021-02-05 ENCOUNTER — Other Ambulatory Visit: Payer: Self-pay

## 2021-02-05 ENCOUNTER — Encounter: Payer: Self-pay | Admitting: Student

## 2021-02-05 ENCOUNTER — Ambulatory Visit (INDEPENDENT_AMBULATORY_CARE_PROVIDER_SITE_OTHER): Payer: Medicaid Other | Admitting: Student

## 2021-02-05 VITALS — BP 126/69 | HR 97 | Wt 236.6 lb

## 2021-02-05 DIAGNOSIS — R350 Frequency of micturition: Secondary | ICD-10-CM

## 2021-02-05 LAB — POCT URINALYSIS DIP (MANUAL ENTRY)
Bilirubin, UA: NEGATIVE
Blood, UA: NEGATIVE
Glucose, UA: NEGATIVE mg/dL
Ketones, POC UA: NEGATIVE mg/dL
Leukocytes, UA: NEGATIVE
Nitrite, UA: NEGATIVE
Protein Ur, POC: NEGATIVE mg/dL
Spec Grav, UA: 1.02 (ref 1.010–1.025)
Urobilinogen, UA: 0.2 E.U./dL
pH, UA: 7 (ref 5.0–8.0)

## 2021-02-05 NOTE — Progress Notes (Signed)
° ° °  SUBJECTIVE:   CHIEF COMPLAINT / HPI:   Urinary Frequency Barbara Clark was recently treated for a UTI identified on 01/23/21. She completed a course of Macrobid but is still having symptoms of urinary frequency/urgency. She feels like perhaps she is also having decreased urine volume. Culture grew >100,000 colonies of Amp resistant E coli. No fever, chills, suprapubic, or flank pain.  She is also asking about taking a cranberry supplement to prevent future UTI. She has T1DM and does state that her blood sugars have been "a little higher" than her baseline recently. She had follow-up scheduled with her endocrinologist in the next few weeks.   PERTINENT  PMH / PSH: UTI (01/23/21) s/p macrobid course  OBJECTIVE:   BP 126/69    Pulse 97    Wt 236 lb 9.6 oz (107.3 kg)    SpO2 100%    BMI 37.06 kg/m   Gen: Alert and engaged in visit Abd: Obese, non-distended and without mass, without suprapubic tenderness or fullness, no flank tenderness  Bedside US did not reveal bladder distention. Unable to calculate post-void residual volume.   Urine dipstick shows negative for all components.    ASSESSMENT/PLAN:   Urinary frequency UA suggests resolution of UTI. No evidence of urinary retention on physical exam or POCUS. Suspect symptoms of urinary frequency are related to her diabetes given self-reported higher-than-normal blood glucose levels. She has follow-up scheduled with her endocrinologist in coming weeks. Patient would like to try cranberry supplements, discussed that these are not FDA regulated but that she is welcome to try them if she would like. Suggested against adding cranberry juice to diet given diabetes. Recommend adequate hydration.     Pearla Dubonnet, MD [place]

## 2021-02-05 NOTE — Patient Instructions (Addendum)
Barbara Clark, It is such a joy to take care you! Thank you for coming in today.   As a reminder, here is a recap of what we talked about today:  - It looks like we have adequately treated the UTI, I am sorry that you are still dealing with symptoms. The urinary frequency is likely from your diabetes. I am glad that you are following up with endocrinology soon.  - You are welcome to try cranberry supplements, just know that these are not FDA regulated. Given your diabetes, I would not add large volumes of juice to your diet.  - Hydration is your best friend.   Take care and seek immediate care sooner if you develop any concerns.   Marnee Guarneri, MD [place]

## 2021-02-05 NOTE — Assessment & Plan Note (Signed)
UA suggests resolution of UTI. No evidence of urinary retention on physical exam or POCUS. Suspect symptoms of urinary frequency are related to her diabetes given self-reported higher-than-normal blood glucose levels. She has follow-up scheduled with her endocrinologist in coming weeks. Patient would like to try cranberry supplements, discussed that these are not FDA regulated but that she is welcome to try them if she would like. Suggested against adding cranberry juice to diet given diabetes. Recommend adequate hydration.

## 2021-02-19 ENCOUNTER — Other Ambulatory Visit: Payer: Self-pay | Admitting: Physician Assistant

## 2021-02-19 NOTE — Progress Notes (Signed)
HPI female Smoker followed for OSA, complicated by HTN, Pleural Lipoma resected, Loculated R Pleural Effusion,  DM 1/neuropathy, DeQuervain's Tenosynovitis, obesity, depression/history conversion Disorder HST 10/15/20- AHI 26/ hr, desaturation to 78%, body weight 255 lbs  ==============================================================   12/16/20-female Smoker followed for OSA, complicated by HTN, Pleural Lipoma resected, Loculated R Pleural Effusion,  DM 1/neuropathy, DeQuervain's Tenosynovitis, obesity, depression/history conversion Disorder HST 10/15/20- AHI 26/ hr, desaturation to 78%, body weight 255 lbs CPAP ordered 10/30/20 auto 5-20/[employer]- needs f/u window 12/24-2/21/23 Body weight today- Covid vax-3 Phizer Flu vax-had Pleural lipoma resected in November, with post-op loculated pleural effusion (Dr Roxan Hockey). She is pending f/u and CXR with him tomorrow/ 11/29. She has only had CPAP to use in the last 3 or 4 days.  Some initial difficulty with water coming from the humidifier, which we discussed.  She understands we will need to see her again and the insurance company required window between 31 and 90 days after she gets her machine.  So far the AutoPap is comfortable for her. She says she is breathing much better now as she recovers from resection of her lipoma. CTachest PE 12/06/20 IMPRESSION: Loculated right-sided pleural effusion with associated lower lobe consolidation Likely postoperative seroma along the lateral chest wall as described  No evidence of pulmonary emboli. Mild left basilar atelectasis.  02/20/21- 46 yo female Smoker followed for OSA, complicated by HTN, Pleural Lipoma resected, Loculated R Pleural Effusion, Tobacco use,  DM 1/neuropathy, DeQuervain's Tenosynovitis, obesity, depression/history conversion Disorder CPAP 5-20/ [facility]   [facility] Download- Body weight today-236 lbs Covid vax-4 Phizer Flu vax-had -----Patient is doing good, no concerns She reports she has  gotten used to CPAP and sleeps better with it than without it.  We have requested download from [employer].  She is using nasal pillows mask we discussed flexibility of mask options. She continues working with endocrinology on her diabetes and says control is better since she got a pump.  Weight loss remains a long-term goal. She has followed with thoracic surgery after resection of a lipoma. CXR 01/30/21- IMPRESSION: Persistent small loculated right pleural effusion and adjacent basilar atelectasis.  ROS-see HPI   + = positive Constitutional:    weight loss, night sweats, fevers, chills, +fatigue, lassitude. HEENT:    headaches, difficulty swallowing, tooth/dental problems, sore throat,       sneezing, itching, ear ache, nasal congestion, post nasal drip, snoring CV:    chest pain, orthopnea, PND, swelling in lower extremities, anasarca,                                   dizziness, palpitations Resp:  + shortness of breath with exertion or at rest.                productive cough,   non-productive cough, coughing up of blood.              change in color of mucus.  wheezing.   Skin:    rash or lesions. GI:  No-   heartburn, indigestion, abdominal pain, nausea, vomiting, diarrhea,                 change in bowel habits, loss of appetite GU: dysuria, change in color of urine, no urgency or frequency.   flank pain. MS:   joint pain, stiffness, decreased range of motion, back pain. Neuro-     nothing unusual Psych:  change in mood  or affect. + depression or anxiety.   memory loss.  OBJ- Physical Exam General- Alert, Oriented, Affect-appropriate, Distress- none acute, + obese Skin- rash-none, lesions- none, excoriation- none Lymphadenopathy- none Head- atraumatic            Eyes- Gross vision intact, PERRLA, conjunctivae and secretions clear            Ears- Hearing, canals-normal            Nose- Clear, no-Septal dev, mucus, polyps, erosion, perforation             Throat- Mallampati IV ,  mucosa clear , drainage- none, tonsils- atrophic, + teeth Neck- flexible , trachea midline, no stridor , thyroid nl, carotid no bruit Chest - symmetrical excursion , unlabored           Heart/CV- RRR , no murmur , no gallop  , no rub, nl s1 s2                           - JVD- none , edema- none, stasis changes- none, varices- none           Lung- clear to P&A, wheeze- none, cough- none , dullness-none, rub- none           Chest wall-  Abd-  Br/ Gen/ Rectal- Not done, not indicated Extrem- cyanosis- none, clubbing, none, atrophy- none, strength- nl Neuro- grossly intact to observation

## 2021-02-20 ENCOUNTER — Encounter: Payer: Self-pay | Admitting: Internal Medicine

## 2021-02-20 ENCOUNTER — Other Ambulatory Visit: Payer: Self-pay

## 2021-02-20 ENCOUNTER — Ambulatory Visit: Payer: Medicaid Other | Admitting: Internal Medicine

## 2021-02-20 DIAGNOSIS — G4733 Obstructive sleep apnea (adult) (pediatric): Secondary | ICD-10-CM | POA: Diagnosis not present

## 2021-02-20 DIAGNOSIS — E669 Obesity, unspecified: Secondary | ICD-10-CM

## 2021-02-20 NOTE — Assessment & Plan Note (Signed)
She describes good compliance and control.  She does feel she benefits from CPAP. Plan-pending download, continue auto 5-20

## 2021-02-20 NOTE — Patient Instructions (Signed)
Glad you are doing well with your CPAP. Please let us know if you have problems or concerns  We can continue auto 5-20

## 2021-02-20 NOTE — Assessment & Plan Note (Signed)
Continue to emphasize diet and exercise.  This will help diabetes control as well as OSA.

## 2021-02-25 ENCOUNTER — Ambulatory Visit: Payer: Medicaid Other | Admitting: Family Medicine

## 2021-02-28 ENCOUNTER — Ambulatory Visit: Payer: Medicaid Other | Admitting: Family Medicine

## 2021-03-24 ENCOUNTER — Other Ambulatory Visit: Payer: Self-pay | Admitting: Family Medicine

## 2021-03-24 DIAGNOSIS — E1065 Type 1 diabetes mellitus with hyperglycemia: Secondary | ICD-10-CM | POA: Diagnosis not present

## 2021-03-28 DIAGNOSIS — H5213 Myopia, bilateral: Secondary | ICD-10-CM | POA: Diagnosis not present

## 2021-04-23 ENCOUNTER — Other Ambulatory Visit: Payer: Self-pay | Admitting: *Deleted

## 2021-04-23 DIAGNOSIS — Z9889 Other specified postprocedural states: Secondary | ICD-10-CM

## 2021-04-23 NOTE — Progress Notes (Signed)
Patient called stating she is experiencing a shooting pain when bending over or raising her arms on her operative side. Patient states she was on Lyrica at one point for neuropathy after surgery but ran out of medication. Patient states the neuropathic pain has increased since returning to work. Appt scheduled for patient to discuss symptoms with Dr. Roxan Hockey. Patient aware of appt date and time.  ?

## 2021-04-24 DIAGNOSIS — E1065 Type 1 diabetes mellitus with hyperglycemia: Secondary | ICD-10-CM | POA: Diagnosis not present

## 2021-04-29 DIAGNOSIS — H02403 Unspecified ptosis of bilateral eyelids: Secondary | ICD-10-CM | POA: Diagnosis not present

## 2021-05-07 ENCOUNTER — Ambulatory Visit
Admission: RE | Admit: 2021-05-07 | Discharge: 2021-05-07 | Disposition: A | Discharge status: Home / Self Care | Payer: Medicaid Other | Source: Ambulatory Visit | Attending: Thoracic Surgery (Cardiothoracic Vascular Surgery) | Admitting: Thoracic Surgery (Cardiothoracic Vascular Surgery)

## 2021-05-07 ENCOUNTER — Ambulatory Visit (INDEPENDENT_AMBULATORY_CARE_PROVIDER_SITE_OTHER): Payer: Medicaid Other | Admitting: Thoracic Surgery (Cardiothoracic Vascular Surgery)

## 2021-05-07 VITALS — BP 117/75 | HR 103 | Resp 20 | Ht 67.0 in | Wt 237.0 lb

## 2021-05-07 DIAGNOSIS — Z9889 Other specified postprocedural states: Secondary | ICD-10-CM | POA: Diagnosis not present

## 2021-05-07 DIAGNOSIS — R079 Chest pain, unspecified: Secondary | ICD-10-CM | POA: Diagnosis not present

## 2021-05-07 MED ORDER — PREGABALIN 25 MG PO CAPS: 25.0000 mg | ORAL_CAPSULE | Freq: Two times a day (BID) | ORAL | 3 refills | Status: DC

## 2021-05-07 MED ORDER — LIDOCAINE 5 % EX PTCH
1.0000 | MEDICATED_PATCH | CUTANEOUS | 1 refills | Status: DC
Start: 2021-05-07 — End: 2021-11-19

## 2021-05-07 NOTE — Progress Notes (Signed)
? ?   ?[address] ?      [place] [address] ?            [phone]   ? ? ?HPI: Barbara Clark returns with some increasing pain at her surgical site ? ?Barbara Clark is a 46 year old woman who had a combined intra and extrapleural resection of a chest wall mass in November 2022.  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  Her past medical history significant for type 1 diabetes with neuropathy, hypertension, anxiety, depression, and sleep apnea.  She had some pain issues and numbness in her chest after the procedure.  She had been on Lyrica 25 mg twice daily, but ran out of that medication several weeks ago. ? ?Recently she has been back at work and has noticed increasing pain at the surgical site, particularly with movement.  She does still have some numbness and mild tingling sensations as well. ? ?Past Medical History:  ?Diagnosis Date  ? Abnormality of gait 12/26/2012  ? Anxiety   ? Chronic leg pain 04/27/2014  ? Depression   ? Diabetes mellitus   ? Type I  ? DKA (diabetic ketoacidosis) ([facility]) 09/03/2017  ? Dyspnea   ? HSV-2 (herpes simplex virus 2) infection 2012  ? serology  ? Hypertension   ? Limb weakness 08/04/2013  ? Menorrhagia 04/27/2014  ? PONV (postoperative nausea and vomiting)   ? Right leg weakness 02/20/2014  ? Sleep apnea 10/15/2020  ? Stuttering 02/02/2013  ? ? ?Current Outpatient Medications  ?Medication Sig Dispense Refill  ? ACCU-CHEK GUIDE test strip     ? Alpha-Lipoic Acid 100 MG CAPS Take by mouth.    ? Ascorbic Acid (VITAMIN C) 100 MG tablet Take 100 mg by mouth daily.    ? atorvastatin (LIPITOR) 10 MG tablet TAKE 1 TABLET BY MOUTH EVERYDAY AT BEDTIME 90 tablet 2  ? Chromium 1 MG CAPS Take by mouth.    ? insulin aspart (NOVOLOG) 100 UNIT/ML injection Inject 0-100 Units into the skin continuous. Via pump    ? Insulin Human (INSULIN PUMP) SOLN Inject into the skin. Patient uses novolog 1.6 units/hr in insulin pump.    ? lidocaine (LIDODERM) 5 % Place 1 patch onto the skin daily.  Remove & Discard patch within 12 hours or as directed by MD 10 patch 1  ? losartan (COZAAR) 25 MG tablet TAKE 1 TABLET BY MOUTH EVERYDAY AT BEDTIME 90 tablet 1  ? Magnesium 100 MG CAPS Take by mouth.    ? Multiple Vitamin (MULTIVITAMIN) capsule Take 1 capsule by mouth daily.    ? Omega-3 Fatty Acids (FISH OIL) 500 MG CAPS Take by mouth.    ? pregabalin (LYRICA) 25 MG capsule Take 1 capsule (25 mg total) by mouth 2 (two) times daily. 60 capsule 1  ? pregabalin (LYRICA) 25 MG capsule Take 1 capsule (25 mg total) by mouth 2 (two) times daily. 60 capsule 3  ? vitamin B-12 (CYANOCOBALAMIN) 500 MCG tablet Take 1,000 mcg by mouth daily. Gummies    ? ?No current facility-administered medications for this visit.  ? ? ?Physical Exam ?BP 117/75 (BP Location: Right Arm, Patient Position: Sitting)   Pulse (!) 103   Resp 20   Ht '5\' 7"'$  (1.702 m)   Wt 237 lb (107.5 kg)   SpO2 97% Comment: RA  BMI 37.12 kg/m?  ?46 year old woman appears tired ?Incisions well-healed ?Lungs clear with equal breath sounds bilaterally ? ?Diagnostic Tests: ?I personally reviewed her  chest x-ray.  No significant abnormal findings. ? ?Impression: ?Barbara Clark is a 46 year old woman who had a combined intra and extrapleural resection for a large chest wall lipoma about 4 months ago.  She had some intercostal neuralgia postoperatively which was well controlled with Lyrica.  Unfortunately she ran out of that medication a few weeks ago. ? ?She has been having some increased pain at the surgical site since she has been back at work and been more active.  To some degree this is to be expected.  She may still have a component of intercostal neuralgia I recommended that we try resuming the Lyrica 25 mg twice daily.  I gave her a prescription for a month with 2 refills and then I will see her back in about 3 months anyway for scheduled follow-up. ? ?Also gave her prescription for a Lidoderm patch she can apply to the site to see if that helps at all.  We  just did 10 of those to see how she does with it but if it helps we can certainly give her more if needed. ? ?We will try to avoid narcotics for now. ? ?Plan: ?Lyrica 25 twice daily ?Lidoderm patch as needed ?Call if pain worsens ?Return as scheduled in July. ? ?[name], MD ?[facility] ?[phone] ? ? ? ?

## 2021-05-12 DIAGNOSIS — E1065 Type 1 diabetes mellitus with hyperglycemia: Secondary | ICD-10-CM | POA: Diagnosis not present

## 2021-05-12 DIAGNOSIS — Z8639 Personal history of other endocrine, nutritional and metabolic disease: Secondary | ICD-10-CM | POA: Diagnosis not present

## 2021-05-12 DIAGNOSIS — E1042 Type 1 diabetes mellitus with diabetic polyneuropathy: Secondary | ICD-10-CM | POA: Diagnosis not present

## 2021-05-12 DIAGNOSIS — Z794 Long term (current) use of insulin: Secondary | ICD-10-CM | POA: Diagnosis not present

## 2021-05-18 ENCOUNTER — Other Ambulatory Visit: Payer: Self-pay | Admitting: Family Medicine

## 2021-05-26 ENCOUNTER — Ambulatory Visit: Payer: Medicaid Other | Admitting: Family Medicine

## 2021-05-26 DIAGNOSIS — E1065 Type 1 diabetes mellitus with hyperglycemia: Secondary | ICD-10-CM | POA: Diagnosis not present

## 2021-05-26 DIAGNOSIS — Z8639 Personal history of other endocrine, nutritional and metabolic disease: Secondary | ICD-10-CM | POA: Diagnosis not present

## 2021-05-26 DIAGNOSIS — Z794 Long term (current) use of insulin: Secondary | ICD-10-CM | POA: Diagnosis not present

## 2021-05-26 DIAGNOSIS — E1042 Type 1 diabetes mellitus with diabetic polyneuropathy: Secondary | ICD-10-CM | POA: Diagnosis not present

## 2021-05-26 DIAGNOSIS — B353 Tinea pedis: Secondary | ICD-10-CM | POA: Diagnosis not present

## 2021-05-29 ENCOUNTER — Ambulatory Visit (INDEPENDENT_AMBULATORY_CARE_PROVIDER_SITE_OTHER): Payer: Medicaid Other | Admitting: Student

## 2021-05-29 ENCOUNTER — Encounter: Payer: Self-pay | Admitting: Student

## 2021-05-29 VITALS — BP 112/76 | HR 78 | Ht 67.0 in | Wt 234.2 lb

## 2021-05-29 DIAGNOSIS — R600 Localized edema: Secondary | ICD-10-CM

## 2021-05-29 DIAGNOSIS — Z30432 Encounter for removal of intrauterine contraceptive device: Secondary | ICD-10-CM | POA: Diagnosis not present

## 2021-05-29 NOTE — Patient Instructions (Addendum)
It was wonderful to meet you today. Thank you for allowing me to be a part of your care. Below is a short summary of what we discussed at your visit today: ? ?Your leg swelling is consistent with venous insufficiency from prolonged standing. ? ?I recommend using compression socks and raising your legs above chest level in the evening or after work. ? ?Also incorporate leg exercise ? ?We will draw labs today to check electrolyte and your kidney. ? ?I have also put in referral for to see OB/Gyn for movable of your IUD. ? ?Follow up in a month ? ?If you have any questions or concerns, please do not hesitate to contact us via phone or MyChart message.  ? ?Alen Bleacher, MD ?[facility]  ?

## 2021-05-29 NOTE — Progress Notes (Addendum)
? ? ?  SUBJECTIVE:  ? ?CHIEF COMPLAINT / HPI: BLE edema  ? ?46 year old female presents with complain of BLE swelling started about 2-3 months ago. 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 Denies any calf pain but reports intermittent cramping on her lower extrimenities . Swelling are worse in the evening after work but improved with keeping legs raised in the evening after work.  ? ?OB/GYN Referral ?She expressed interest in getting referral for an OB/GYN for a myomectomy she was initially supposed to do back in 2021.According to patient her IUD couldn't be removed at the time and was scheduled for a myomectomy however there was no follow up and the procedure was never done. ? ?PERTINENT  PMH / PSH: T1DM, OSA ? ?OBJECTIVE:  ? ?BP 112/76   Pulse 78   Ht '5\' 7"'$  (1.702 m)   Wt 234 lb 3.2 oz (106.2 kg)   SpO2 100%   BMI 36.68 kg/m?   ? ? ?Physical Exam ?General: Alert, well appearing, NAD ?Cardiovascular: RRR, No Murmurs, Normal S2/S2 ?Respiratory: CTAB, No wheezing or Rales ?Extremities: No edema on extremities   ? ? ?ASSESSMENT/PLAN:  ? ?Venous insufficiency ?Patient's symptoms are consistent with venous insufficiency due to prolonged standing at work.  ?-Recommend use of compression socks ?-Keep legs raised in the evening after work ?-Encouraged incorporating leg exercise to her workout ?-Will obtain BMP to check kidney function ?-Follow up in a month ? ?OB/GYN Referral ?Per chart review patient saw Dr. Vivien Rota back in 2021.  Placed an ambulatory OB/GYN referral. ? ? ? ?Alen Bleacher, MD ?[place]  ? ? ?

## 2021-05-30 LAB — BASIC METABOLIC PANEL
BUN/Creatinine Ratio: 9 (ref 9–23)
BUN: 7 mg/dL (ref 6–24)
CO2: 22 mmol/L (ref 20–29)
Calcium: 9.4 mg/dL (ref 8.7–10.2)
Chloride: 99 mmol/L (ref 96–106)
Creatinine, Ser: 0.75 mg/dL (ref 0.57–1.00)
Glucose: 164 mg/dL — ABNORMAL HIGH (ref 70–99)
Potassium: 4.6 mmol/L (ref 3.5–5.2)
Sodium: 137 mmol/L (ref 134–144)
eGFR: 101 mL/min/{1.73_m2} (ref >59)

## 2021-06-04 DIAGNOSIS — H02403 Unspecified ptosis of bilateral eyelids: Secondary | ICD-10-CM | POA: Diagnosis not present

## 2021-06-09 ENCOUNTER — Encounter: Payer: Self-pay | Admitting: Student

## 2021-06-09 ENCOUNTER — Ambulatory Visit (INDEPENDENT_AMBULATORY_CARE_PROVIDER_SITE_OTHER): Payer: Medicaid Other | Admitting: Student

## 2021-06-09 VITALS — BP 102/67 | HR 98 | Temp 97.9°F | Ht 67.0 in | Wt 234.6 lb

## 2021-06-09 DIAGNOSIS — A084 Viral intestinal infection, unspecified: Secondary | ICD-10-CM | POA: Diagnosis not present

## 2021-06-09 NOTE — Patient Instructions (Addendum)
It was great to see you! Thank you for allowing me to participate in your care!  It looks like you have a GI virus that is causing your symptoms. These are self limited, and should get better with time. Be sure to continually drink fluids/stay hydrated. Eat what you can tolerate on your stomach  Our plans for today:  - Symptom management The GI virus should go away if a few days, just be sure to stay hydrated! Drink often/as frequently as you can manage.  Take tylenol as needed for fevers - Work note  Will write you out of work for today and tomorrow, hoping you're able to go back to work come Wednesday   Take care and seek immediate care sooner if you develop any concerns.   Dr. Holley Bouche, MD [place]

## 2021-06-09 NOTE — Progress Notes (Signed)
  SUBJECTIVE:   CHIEF COMPLAINT / HPI:   Not feeling well  Started Saturday, ate something at training, got home around 3 pm, slept all day, and woke up feeling woozy, and felt warm. Took some nightquil and went back to sleep. Went to training next day, and they noted other people had similar symptoms, and she was sent home.   Today: Started  vomiting x2 today, no blood in emesis. Stomach continues to hurt and she continues to feel warm. Nose has been stuffy. Feeling fatgiued and like she want's to lay down and sleep. Has been trying to stay hydrated, but it's not doing anything, still having dark urine shortly after, and decreased appetite. Bowel movements were sometimes loose, sometimes normal, no blood in stool, no stool for last 2 days.    PERTINENT  PMH / PSH: DM  Past Medical History:  Diagnosis Date   Abnormality of gait 12/26/2012   Anxiety    Chronic leg pain 04/27/2014   Depression    Diabetes mellitus    Type I   DKA (diabetic ketoacidosis) ([facility]) 09/03/2017   Dyspnea    HSV-2 (herpes simplex virus 2) infection 2012   serology   Hypertension    Limb weakness 08/04/2013   Menorrhagia 04/27/2014   PONV (postoperative nausea and vomiting)    Right leg weakness 02/20/2014   Sleep apnea 10/15/2020   Stuttering 02/02/2013    Past Surgical History:  Procedure Laterality Date   DENTAL RESTORATION/EXTRACTION WITH X-RAY Left 11/06/2020   EXAM UNDER ANESTHESIA WITH MANIPULATION OF SHOULDER  03/2020   EYE SURGERY Bilateral 2021   cataracts   INTERCOSTAL NERVE BLOCK Right 11/28/2020   Procedure: INTERCOSTAL NERVE BLOCK;  Surgeon: [name], MD;  Location: [facility] OR;  Service: Thoracic;  Laterality: Right;   TUBAL LIGATION      OBJECTIVE:  BP 102/67   Pulse 98   Temp 97.9 F (36.6 C)   Ht '5\' 7"'$  (1.702 m)   Wt 234 lb 9.6 oz (106.4 kg)   SpO2 100%   BMI 36.74 kg/m   General: NAD, tired, ill appearing Cardiac: RRR, no murmurs auscultated. HEENT:  MMM Respiratory: CTAB, normal effort, no wheezes, rales or rhonchi Abdomen: soft, non-tender, non-distended, hypoactive bowel sounds Extremities: warm and well perfused, no edema or cyanosis. Skin: warm and dry, no rashes noted Neuro: alert, no obvious focal deficits, speech normal Psych: Normal affect and mood  ASSESSMENT/PLAN:  Viral Gastroenteritis It sounds like patient is suffering from a GI viral illness. Symptoms began after work exposure with people having similar issues. She notes GI upset and vomiting, no blood in emesis, and no stools for last 2 days, but they were mixed loose before then. Patient likely suffering from viral gastroenteritis, as opposed to some bacterial. Less likely bacterial, given lack of blood in stools, a degree of symptoms, patient without bowel movements at this time, thus dose not appear to have dysentery.  -Symptom management -Continued hydration -Follow up in 1-2 wks if not improving  No problem-specific Assessment & Plan notes found for this encounter.   No orders of the defined types were placed in this encounter.  No orders of the defined types were placed in this encounter.  No follow-ups on file. '@SIGNNOTE'$ @

## 2021-06-20 ENCOUNTER — Emergency Department (HOSPITAL_COMMUNITY): Payer: Medicaid Other

## 2021-06-20 ENCOUNTER — Encounter (HOSPITAL_COMMUNITY): Payer: Self-pay

## 2021-06-20 ENCOUNTER — Emergency Department (HOSPITAL_COMMUNITY)
Admission: EM | Admit: 2021-06-20 | Discharge: 2021-06-20 | Disposition: A | Discharge status: Home / Self Care | Payer: Medicaid Other | Attending: Emergency Medicine | Admitting: Emergency Medicine

## 2021-06-20 DIAGNOSIS — Z794 Long term (current) use of insulin: Secondary | ICD-10-CM | POA: Diagnosis not present

## 2021-06-20 DIAGNOSIS — S59901A Unspecified injury of right elbow, initial encounter: Secondary | ICD-10-CM | POA: Insufficient documentation

## 2021-06-20 DIAGNOSIS — W010XXA Fall on same level from slipping, tripping and stumbling without subsequent striking against object, initial encounter: Secondary | ICD-10-CM | POA: Insufficient documentation

## 2021-06-20 DIAGNOSIS — W19XXXA Unspecified fall, initial encounter: Secondary | ICD-10-CM

## 2021-06-20 DIAGNOSIS — E109 Type 1 diabetes mellitus without complications: Secondary | ICD-10-CM | POA: Diagnosis not present

## 2021-06-20 DIAGNOSIS — Y990 Civilian activity done for income or pay: Secondary | ICD-10-CM | POA: Insufficient documentation

## 2021-06-20 DIAGNOSIS — M25521 Pain in right elbow: Secondary | ICD-10-CM

## 2021-06-20 DIAGNOSIS — Z79899 Other long term (current) drug therapy: Secondary | ICD-10-CM | POA: Diagnosis not present

## 2021-06-20 MED ORDER — ACETAMINOPHEN 500 MG PO TABS
1000.0000 mg | ORAL_TABLET | Freq: Once | ORAL | Status: AC
  Administered 2021-06-20: 1000 mg via ORAL
  Filled 2021-06-20: qty 2

## 2021-06-20 NOTE — ED Notes (Signed)
Patient transported to X-ray 

## 2021-06-20 NOTE — Discharge Instructions (Addendum)
As we discussed today it is very important that you frequently take your sling off to move your right shoulder.  Please try to wear the sling only as needed for comfort.  When you are home and resting please try not to wear it.  Please take Ibuprofen (Advil, motrin) and Tylenol (acetaminophen) to relieve your pain.    You may take up to 600 MG (3 pills) of normal strength ibuprofen every 8 hours as needed.   You make take tylenol, up to 1,000 mg (two extra strength pills) every 8 hours as needed.   It is safe to take ibuprofen and tylenol at the same time as they work differently.   Do not take more than 3,000 mg tylenol in a 24 hour period (not more than one dose every 8 hours.  Please check all medication labels as many medications such as pain and cold medications may contain tylenol.  Do not drink alcohol while taking these medications.  Do not take other NSAID'S while taking ibuprofen (such as aleve or naproxen).  Please take ibuprofen with food to decrease stomach upset.

## 2021-06-20 NOTE — ED Provider Notes (Signed)
[facility] DEPT Provider Note   CSN: [id number] Arrival date & time: 06/20/21  1307     History  Chief Complaint  Patient presents with   Arm Pain    Right   Fall    Barbara Clark is a 46 y.o. female who presents today for evaluation of pain in her right elbow. She reports that shortly prior to arrival she was at work.  She had a mechanical nonsyncopal trip and fall causing her to hit her right elbow on the metal corner on a object and then on the ground.  She denies striking her head or any other injuries.  She reports no medications taken prior to arrival.  She states her pain worsens with any movement.  Ice has provided mild relief.    HPI     Home Medications Prior to Admission medications   Medication Sig Start Date End Date Taking? Authorizing Provider  ACCU-CHEK GUIDE test strip  09/08/19   [provider]  Alpha-Lipoic Acid 100 MG CAPS Take by mouth.    [provider]  Ascorbic Acid (VITAMIN C) 100 MG tablet Take 100 mg by mouth daily.    [provider]  atorvastatin (LIPITOR) 10 MG tablet TAKE 1 TABLET BY MOUTH EVERYDAY AT BEDTIME 03/24/21   [name], MD  Chromium 1 MG CAPS Take by mouth.    [provider]  insulin aspart (NOVOLOG) 100 UNIT/ML injection Inject 0-100 Units into the skin continuous. Via pump 06/14/18   [provider]  Insulin Human (INSULIN PUMP) SOLN Inject into the skin. Patient uses novolog 1.6 units/hr in insulin pump.    [provider]  lidocaine (LIDODERM) 5 % Place 1 patch onto the skin daily. Remove & Discard patch within 12 hours or as directed by MD 05/07/21   [name], MD  losartan (COZAAR) 25 MG tablet TAKE 1 TABLET BY MOUTH EVERYDAY AT BEDTIME 05/19/21   McDiarmid, Blane Ohara, MD  Magnesium 100 MG CAPS Take by mouth.    [provider]  Multiple Vitamin (MULTIVITAMIN) capsule Take 1 capsule by mouth daily.    [provider]   Omega-3 Fatty Acids (FISH OIL) 500 MG CAPS Take by mouth.    [provider]  pregabalin (LYRICA) 25 MG capsule Take 1 capsule (25 mg total) by mouth 2 (two) times daily. 05/07/21   [name], MD  vitamin B-12 (CYANOCOBALAMIN) 500 MCG tablet Take 1,000 mcg by mouth daily. Gummies    [provider]      Allergies    Patient has no known allergies.    Review of Systems   Review of Systems See above Physical Exam Updated Vital Signs BP 133/78 (BP Location: Left Arm)   Pulse 89   Temp 98.5 F (36.9 C) (Oral)   Resp 17   LMP 06/18/2021 (Exact Date)   SpO2 97%  Physical Exam Vitals and nursing note reviewed.  Constitutional:      General: She is not in acute distress. HENT:     Head: Normocephalic and atraumatic.  Cardiovascular:     Rate and Rhythm: Normal rate.  Pulmonary:     Effort: Pulmonary effort is normal. No respiratory distress.  Musculoskeletal:     Cervical back: No rigidity.     Comments: Patient has significant pain and tenderness over the posterior aspect of the right elbow.  She has pain with any attempted range of movement of the right elbow.  She  is able to pronate and supinate her right forearm.  Compartments in the right arm are soft and easily compressible.  There is no obvious deformity to the right elbow  Skin:    Comments: No abrasion, laceration, or skin defect noted over right elbow  Neurological:     Mental Status: She is alert. Mental status is at baseline.     Sensory: No sensory deficit (Sensation intact to light touch to right upper extremity).     Comments: Awake and alert, answers all questions appropriately.  Speech is not slurred.    Psychiatric:        Mood and Affect: Mood normal.    ED Results / Procedures / Treatments   Labs (all labs ordered are listed, but only abnormal results are displayed) Labs Reviewed - No data to display  EKG None  Radiology DG Elbow Complete Right  Result Date:  06/20/2021 CLINICAL DATA:  Fall, elbow pain.  Initial encounter. EXAM: RIGHT ELBOW - COMPLETE 3+ VIEW COMPARISON:  None. FINDINGS: No acute osseous or joint abnormality. There may be slight soft tissue swelling over the olecranon. IMPRESSION: No acute osseous or joint abnormality. Electronically Signed   By: Lorin Picket M.D.   On: 06/20/2021 13:39    Procedures Procedures    Medications Ordered in ED Medications  acetaminophen (TYLENOL) tablet 1,000 mg (1,000 mg Oral Given 06/20/21 1434)    ED Course/ Medical Decision Making/ A&P                           Medical Decision Making Amount and/or Complexity of Data Reviewed Radiology: ordered.  Risk OTC drugs.   Patient presents to the emergency room for right elbow pain that occurred after a mechanical nonsyncopal trip and fall. Differential primarily includes fracture, dislocation contusion, abrasion, laceration, sprain,   Co morbidities that complicate the patient evaluation  DM1, body habitus  Imaging Studies ordered:  I ordered imaging studies including x-rays of the right elbow I independently visualized and interpreted imaging which showed no obvious fracture or dislocation    Problem List / ED Course / Critical interventions / Medication management  Right elbow pain I ordered medication including Tylenol for pain, sling for comfort  Return precautions were discussed with patient who states their understanding.  Outpatient follow up with any provider at [facility].  At the time of discharge patient denied any unaddressed complaints or concerns.  Patient is agreeable for discharge home.  Note: Portions of this report may have been transcribed using voice recognition software. Every effort was made to ensure accuracy; however, inadvertent computerized transcription errors may be present          Final Clinical Impression(s) / ED Diagnoses Final diagnoses:  Fall, initial encounter  Right elbow pain    Rx  / DC Orders ED Discharge Orders     None         Lorin Glass, PA-C 06/20/21 2129    Lorelle Gibbs, DO 06/23/21 [phone]

## 2021-06-20 NOTE — ED Triage Notes (Addendum)
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 Pt reports running back and forth and tripped over someone's foot hitting a wall then the floor.   C/o right elbow pain that radiates up arm.  8/10 pain   Pt reports shoulder surgery the beginning of last year.    A/ox4 Ambulatory in triage.

## 2021-06-24 ENCOUNTER — Encounter: Payer: Self-pay | Admitting: *Deleted

## 2021-06-26 DIAGNOSIS — E1065 Type 1 diabetes mellitus with hyperglycemia: Secondary | ICD-10-CM | POA: Diagnosis not present

## 2021-06-30 ENCOUNTER — Telehealth: Payer: Self-pay | Admitting: *Deleted

## 2021-06-30 DIAGNOSIS — Z1231 Encounter for screening mammogram for malignant neoplasm of breast: Secondary | ICD-10-CM

## 2021-06-30 NOTE — Telephone Encounter (Signed)
Patient left a message requesting an order for a mammogram be placed.  Will forward to MD. Barbara Clark

## 2021-07-04 ENCOUNTER — Ambulatory Visit
Admission: RE | Admit: 2021-07-04 | Discharge: 2021-07-04 | Disposition: A | Discharge status: Home / Self Care | Payer: Medicaid Other | Source: Ambulatory Visit | Attending: Family Medicine | Admitting: Family Medicine

## 2021-07-04 ENCOUNTER — Ambulatory Visit (INDEPENDENT_AMBULATORY_CARE_PROVIDER_SITE_OTHER): Payer: Medicaid Other | Admitting: Student

## 2021-07-04 ENCOUNTER — Encounter: Payer: Self-pay | Admitting: Student

## 2021-07-04 VITALS — BP 131/80 | HR 104 | Ht 67.0 in | Wt 236.0 lb

## 2021-07-04 DIAGNOSIS — Z1231 Encounter for screening mammogram for malignant neoplasm of breast: Secondary | ICD-10-CM

## 2021-07-04 DIAGNOSIS — M79642 Pain in left hand: Secondary | ICD-10-CM

## 2021-07-04 NOTE — Patient Instructions (Addendum)
It was wonderful to see you today. Thank you for allowing me to be a part of your care. Below is a short summary of what we discussed at your visit today:  Continue to use the thumb spica brace during the day and you can use cold compress in the evening.   Ibuprofen for pain management  Follow up in two weeks     If you have any questions or concerns, please do not hesitate to contact us via phone or MyChart message.   Alen Bleacher, MD [facility]

## 2021-07-04 NOTE — Progress Notes (Signed)
    SUBJECTIVE:   CHIEF COMPLAINT / HPI:   46 year old female who presents for follow-up for BLE edema in the setting of venous insufficiency.  Patient report swelling on her lower extrimities is much improved and she's no longer concerned about it. However she is complaining of  tenderness and swelling on her left hand. Denies any recent trauma. This has been going on for years and more frequently in the last year. Started having pain this week and noticed swelling. Pain is worse with movement .Got a brace yesterday from [facility] and only used it for few hours. She hasn't tried anything else for pain relieve.    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   OBJECTIVE:   BP 131/80   Pulse (!) 104   Ht '5\' 7"'$  (1.702 m)   Wt 236 lb (107 kg)   LMP 06/18/2021 (Exact Date)   SpO2 98%   BMI 36.96 kg/m    Physical Exam General: Alert, well appearing, NAD Cardiovascular: RRR, No Murmurs, Normal S2/S2 Respiratory: CTAB, No wheezing or Rales Abdomen: No distension or tenderness Extremities: Soft tissue swelling over the thumb side of the wrist and positive Finkelstein-Eichhoff test Skin: Warm and dry    ASSESSMENT/PLAN:   Left hand pain Patient left hand pain and swelling is of unclear etiology, however suspecting De Quavain tendonitis given positive Finkelstein test.  Unlikely carpal tunnel given her presentation and negative Tinel signs. Low signs for snuffbox injury as patient denies any recent trauma. -Patient continues using thumb spica bracelets during the day. -Apply cold compress in the evenings -As needed-ibuprofen for pain -Follow-up in 2 weeks     Alen Bleacher, MD [place]

## 2021-07-09 ENCOUNTER — Other Ambulatory Visit: Payer: Self-pay | Admitting: Family Medicine

## 2021-07-09 DIAGNOSIS — R928 Other abnormal and inconclusive findings on diagnostic imaging of breast: Secondary | ICD-10-CM

## 2021-07-10 ENCOUNTER — Ambulatory Visit
Admission: RE | Admit: 2021-07-10 | Discharge: 2021-07-10 | Disposition: A | Discharge status: Home / Self Care | Payer: Medicaid Other | Source: Ambulatory Visit | Attending: Family Medicine | Admitting: Family Medicine

## 2021-07-10 DIAGNOSIS — N6001 Solitary cyst of right breast: Secondary | ICD-10-CM | POA: Diagnosis not present

## 2021-07-10 DIAGNOSIS — R928 Other abnormal and inconclusive findings on diagnostic imaging of breast: Secondary | ICD-10-CM

## 2021-07-14 ENCOUNTER — Encounter: Payer: Self-pay | Admitting: Student

## 2021-07-14 DIAGNOSIS — G4733 Obstructive sleep apnea (adult) (pediatric): Secondary | ICD-10-CM | POA: Diagnosis not present

## 2021-07-16 DIAGNOSIS — E1042 Type 1 diabetes mellitus with diabetic polyneuropathy: Secondary | ICD-10-CM | POA: Diagnosis not present

## 2021-07-16 DIAGNOSIS — E1065 Type 1 diabetes mellitus with hyperglycemia: Secondary | ICD-10-CM | POA: Diagnosis not present

## 2021-07-16 DIAGNOSIS — Z8639 Personal history of other endocrine, nutritional and metabolic disease: Secondary | ICD-10-CM | POA: Diagnosis not present

## 2021-07-16 DIAGNOSIS — Z794 Long term (current) use of insulin: Secondary | ICD-10-CM | POA: Diagnosis not present

## 2021-07-24 NOTE — Progress Notes (Signed)
    SUBJECTIVE:   CHIEF COMPLAINT / HPI:   Left hand pain: patient has a history of b/l carpal tunnel for >5 years, but she has had left hand pain that has been steadily getting worse over her left thumb for several months. She can now not move her left wrist or left thumb without pain. She wakes up at night and has to shake her hand out for many years, but her pain has now been over her left hand. She works at 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 She has been wearing a cockup splint without improvement. She has been using excedrin for pain. She has a h/o T1DM and previously had CSI in her right hand for carpal tunnel and reported that her "sugars went crazy" and she would like to avoid CSI in future.  IUD: patient has had IUD in place that failed removal in office at [facility] in 12/21/19. She was interested in a myomectomy at the same time as she has uterine fibroids and AUB. Referral placed to OBGYN, she does not yet have appt. Gave patient phone number to call and make appt.  PERTINENT  PMH / PSH: T1DM  OBJECTIVE:   BP 126/74   Pulse 95   Ht '5\' 7"'$  (1.702 m)   Wt 236 lb 6.4 oz (107.2 kg)   SpO2 100%   BMI 37.03 kg/m   Nursing note and vitals reviewed GEN: age appropriate, AAW, resting comfortably in chair, NAD, class II obesity Wrist, L: Inspection yielded mild edema; no erythema, ecchymosis, bony deformity. ROM limited by pain in ulnar deviation. Tender to touch over radius, snuff box and along thumb; exquisitely tender finkelstein's test, patient unable to make fist. Phalen's and tinel's negative. Neuro: AOx3  Ext: no edema Psych: Pleasant and appropriate  ASSESSMENT/PLAN:   Left wrist pain Suspect dequervain's tenosynovitis as patient has floridly positive finkelstein's test.  Patient is returning to work this week and would benefit from greater therapy.  Recommend naproxen 500 mg  twice daily, Voltaren gel 4 times daily as needed.  Also gave patient cock-up splint with thumb spica to help stabilize thumb especially while at work and this next week.  I placed a referral to sports medicine today, they are able to see her, sent patient to see sports medicine for further imaging and management.     Gladys Damme, MD [place]

## 2021-07-25 ENCOUNTER — Encounter: Payer: Self-pay | Admitting: Family Medicine

## 2021-07-25 ENCOUNTER — Ambulatory Visit (INDEPENDENT_AMBULATORY_CARE_PROVIDER_SITE_OTHER): Payer: Medicaid Other | Admitting: Family Medicine

## 2021-07-25 VITALS — Ht 67.0 in | Wt 236.0 lb

## 2021-07-25 VITALS — BP 126/74 | HR 95 | Ht 67.0 in | Wt 236.4 lb

## 2021-07-25 DIAGNOSIS — M654 Radial styloid tenosynovitis [de Quervain]: Secondary | ICD-10-CM

## 2021-07-25 DIAGNOSIS — M25532 Pain in left wrist: Secondary | ICD-10-CM | POA: Insufficient documentation

## 2021-07-25 MED ORDER — DICLOFENAC SODIUM 1 % EX GEL: 2.0000 g | Freq: Four times a day (QID) | CUTANEOUS | 3 refills | Status: DC

## 2021-07-25 MED ORDER — NAPROXEN 500 MG PO TABS: 500.0000 mg | ORAL_TABLET | Freq: Two times a day (BID) | ORAL | 0 refills | Status: DC

## 2021-07-25 MED ORDER — KETOROLAC TROMETHAMINE 60 MG/2ML IM SOLN
15.0000 mg | Freq: Once | INTRAMUSCULAR | Status: AC
  Administered 2021-07-25: 15 mg via INTRA_ARTICULAR

## 2021-07-25 NOTE — Progress Notes (Signed)
Barbara Clark - 46 y.o. female MRN [id number]  Date of birth: 1975-03-10    SUBJECTIVE:      Chief Complaint:/ HPI:    Patient was in clinic today to be evaluated for left wrist pain.  She was previously evaluated by family medicine clinic earlier this morning and sent down here for further management.  Patient first developed bilateral wrist pain approximately 6 years ago.  She was told she had carpal tunnel.  She saw a physician in [facility] who gave her cortisone injections which relieved her pain but unfortunately spiked her sugars as she is a type I diabetic.  She was told by her physician to no longer receive steroid injections due to the increase of sugars.  She has been pain-free in her wrists ever since then up until about 2 to 3 months ago.  She started developing bilateral wrist pain and swelling.  The pain is most significant in the left wrist over the dorsal aspect of the thumb and first MCP joint.  The pain extends approximately up to the level of the distal radius.  She says she has great pain with lifting and gripping.  She thought she had a recurrence of her carpal tunnel so she got a wrist brace to wear during the day but that did not give her any relief.  She is returning to work next week at [employer] and says she is looking for pain relief as soon as possible.  Due to her type 1 diabetes, she does have diabetic neuropathy.  She otherwise denies radiation of the pain, numbness or tingling of the other fingers.  ROS:     See HPI  PERTINENT  PMH / PSH FH / / SH:  Past Medical, Surgical, Social, and Family History Reviewed & Updated in the EMR.  Pertinent findings include:  Type 1 diabetes which patient ports is well controlled  OBJECTIVE: Ht '5\' 7"'$  (1.702 m)   Wt 236 lb (107 kg)   BMI 36.96 kg/m   Physical Exam:  Vital signs are reviewed.  GEN: Alert and oriented, NAD Pulm: Breathing unlabored PSY: normal mood, congruent affect  MSK: Left wrist - upon  inspection, mild edema is present.  She has limited range of motion in flexion and extension due to pain radial aspect of wrist.  Tenderness 1st dorsal compartment.  No carpal tunnel, 1st CMC tenderness.  Sensation is intact to light touch.  Negative Tinel's test.  Negative Phalen test.  Exquisitely positive Finkelstein's test as patient is barely able to make a fist with the left thumb and deviate it ulnarly.  Ultrasound left wrist limited -there is obvious fluid surrounding the abductor pollicis longus and extensor pollicis brevis in the first compartment.  PROCEDURE NOTE  Procedure performed: L Wrist First Compartment injection, ultrasound-guided  Consent obtained and verified. Time-out conducted. Noted no overlying erythema, induration, or other signs of local infection. The APL and EPB tendons were identified with ultrasound. The overlying skin was prepped in a sterile fashion. Topical analgesic spray: Ethyl chloride. Needle: 25g 5/8" Completed without difficulty. Meds: 0.5 cc 1 % lidocaine, '15mg'$  Toradol  Advised to call if fevers/chills, erythema, induration, drainage, or persistent bleeding.   ASSESSMENT & PLAN:  1.  De Quervain's tenosynovitis -Patient's history, exam, and ultrasound findings are consistent with de Quervain's tenosynovitis.  She has significant pain in the left wrist and thumb and needs to return to work next week.  Due to her type 1 diabetes, she is not a candidate  for a steroid injection.  We discussed risk-benefit/alternatives and patient wishes to proceed with an injection of lidocaine/Toradol for pain relief today.  See procedure note for full details.  She was also instructed to continue to wear her thumb spica splint and take the naproxen/apply Voltaren gel that was prescribed from her family medicine physician.  Patient will follow-up in 1 month for reevaluation

## 2021-07-25 NOTE — Patient Instructions (Signed)
You have deQuervain's tenosynovitis of your thumb/wrist. Avoid painful activities as much as possible. Wear the thumb spica brace as often as possible to rest this at least for the next week. Ice 15 minutes at a time 3-4 times a day. Take naproxen that you were prescribed for pain and inflammation. You were given an injection of toradol and lidocaine today (this was not a steroid but a strong anti-inflammatory that won't raise your blood sugar) Follow up with me in 1 month for reevaluation.

## 2021-07-25 NOTE — Assessment & Plan Note (Addendum)
Suspect dequervain's tenosynovitis as patient has floridly positive finkelstein's test.  Patient is returning to work this week and would benefit from greater therapy.  Recommend naproxen 500 mg twice daily, Voltaren gel 4 times daily as needed.  Also gave patient cock-up splint with thumb spica to help stabilize thumb especially while at work and this next week.  I placed a referral to sports medicine today, they are able to see her, sent patient to see sports medicine for further imaging and management.

## 2021-07-25 NOTE — Patient Instructions (Addendum)
It was a pleasure to see you today!  You have dequervain's tenosynovitis I recommend wearing a splint with thumb stabilization: cock up splint with thumb spica I have placed a referral for sports medicine. Go down there now to be seen. To get an appointment with gynecology, call Phone: [phone] I recommend you use voltaren gel and naproxen 500 mg twice a day for pain  Be Well,  Dr. Chauncey Reading

## 2021-07-28 ENCOUNTER — Other Ambulatory Visit: Payer: Self-pay | Admitting: Thoracic Surgery (Cardiothoracic Vascular Surgery)

## 2021-07-28 DIAGNOSIS — R222 Localized swelling, mass and lump, trunk: Secondary | ICD-10-CM

## 2021-07-28 DIAGNOSIS — E1065 Type 1 diabetes mellitus with hyperglycemia: Secondary | ICD-10-CM | POA: Diagnosis not present

## 2021-07-29 ENCOUNTER — Ambulatory Visit: Payer: Medicaid Other | Admitting: Thoracic Surgery (Cardiothoracic Vascular Surgery)

## 2021-07-29 ENCOUNTER — Ambulatory Visit
Admission: RE | Admit: 2021-07-29 | Discharge: 2021-07-29 | Disposition: A | Discharge status: Home / Self Care | Payer: Medicaid Other | Source: Ambulatory Visit | Attending: Thoracic Surgery (Cardiothoracic Vascular Surgery) | Admitting: Thoracic Surgery (Cardiothoracic Vascular Surgery)

## 2021-07-29 ENCOUNTER — Ambulatory Visit: Payer: Medicaid Other | Admitting: Student

## 2021-07-29 VITALS — BP 128/79 | HR 87 | Resp 20 | Ht 67.0 in | Wt 240.0 lb

## 2021-07-29 DIAGNOSIS — M545 Low back pain, unspecified: Secondary | ICD-10-CM | POA: Insufficient documentation

## 2021-07-29 DIAGNOSIS — R222 Localized swelling, mass and lump, trunk: Secondary | ICD-10-CM | POA: Diagnosis not present

## 2021-07-29 DIAGNOSIS — Z794 Long term (current) use of insulin: Secondary | ICD-10-CM | POA: Insufficient documentation

## 2021-07-29 DIAGNOSIS — E1065 Type 1 diabetes mellitus with hyperglycemia: Secondary | ICD-10-CM | POA: Insufficient documentation

## 2021-07-29 DIAGNOSIS — Z92241 Personal history of systemic steroid therapy: Secondary | ICD-10-CM | POA: Insufficient documentation

## 2021-07-29 DIAGNOSIS — Z8639 Personal history of other endocrine, nutritional and metabolic disease: Secondary | ICD-10-CM | POA: Insufficient documentation

## 2021-07-29 DIAGNOSIS — Z9889 Other specified postprocedural states: Secondary | ICD-10-CM | POA: Diagnosis not present

## 2021-07-29 DIAGNOSIS — E1042 Type 1 diabetes mellitus with diabetic polyneuropathy: Secondary | ICD-10-CM | POA: Diagnosis not present

## 2021-07-29 NOTE — Progress Notes (Signed)
[address]       [place] [address]             [phone]     HPI: [name] returns for scheduled follow-up visit.  Barbara Clark is a 46 year old woman who had a combined intra and extrapleural resection of a chest wall mass in November 2022.  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  Past medical history significant for type 1 diabetes with neuropathy, hypertension, anxiety, depression, and sleep apnea.  She had some paresthesias and numbness after her surgery.  She was on Lyrica 25 mg twice daily.  She says she still has some numbness but really does not have pain over there.  She is not taking Lyrica anymore.  No respiratory issues.  She been having some issues with her left thumb and wrist.  Past Medical History:  Diagnosis Date   Abnormality of gait 12/26/2012   Anxiety    Chronic leg pain 04/27/2014   Depression    Diabetes mellitus    Type I   DKA (diabetic ketoacidosis) ([place]) 09/03/2017   Dyspnea    HSV-2 (herpes simplex virus 2) infection 2012   serology   Hypertension    Limb weakness 08/04/2013   Menorrhagia 04/27/2014   PONV (postoperative nausea and vomiting)    Right leg weakness 02/20/2014   Sleep apnea 10/15/2020   Stuttering 02/02/2013    Current Outpatient Medications  Medication Sig Dispense Refill   ACCU-CHEK GUIDE test strip      Alpha-Lipoic Acid 100 MG CAPS Take by mouth.     Ascorbic Acid (VITAMIN C) 100 MG tablet Take 100 mg by mouth daily.     atorvastatin (LIPITOR) 10 MG tablet TAKE 1 TABLET BY MOUTH EVERYDAY AT BEDTIME 90 tablet 2   Chromium 1 MG CAPS Take by mouth.     diclofenac Sodium (VOLTAREN) 1 % GEL Apply 2 g topically 4 (four) times daily. 50 g 3   DULoxetine (CYMBALTA) 60 MG capsule Take 60 mg by mouth daily.     insulin aspart (NOVOLOG) 100 UNIT/ML injection Inject 0-100 Units into the skin continuous. Via pump     Insulin Human (INSULIN PUMP) SOLN Inject into the skin. Patient uses novolog 1.6 units/hr in  insulin pump.     lidocaine (LIDODERM) 5 % Place 1 patch onto the skin daily. Remove & Discard patch within 12 hours or as directed by MD 10 patch 1   losartan (COZAAR) 25 MG tablet TAKE 1 TABLET BY MOUTH EVERYDAY AT BEDTIME 90 tablet 0   Magnesium 100 MG CAPS Take by mouth.     Multiple Vitamin (MULTIVITAMIN) capsule Take 1 capsule by mouth daily.     naproxen (NAPROSYN) 500 MG tablet Take 1 tablet (500 mg total) by mouth 2 (two) times daily with a meal. 30 tablet 0   Omega-3 Fatty Acids (FISH OIL) 500 MG CAPS Take by mouth.     vitamin B-12 (CYANOCOBALAMIN) 500 MCG tablet Take 1,000 mcg by mouth daily. Gummies     pregabalin (LYRICA) 25 MG capsule Take 1 capsule (25 mg total) by mouth 2 (two) times daily. (Patient not taking: Reported on 07/29/2021) 60 capsule 3   No current facility-administered medications for this visit.    Physical Exam BP 128/79   Pulse 87   Resp 20   Ht '5\' 7"'$  (1.702 m)   Wt 240 lb (108.9 kg)   SpO2 100% Comment: RA  BMI 37.59  kg/m  46 year old woman in no acute distress Alert and oriented x3 with no focal deficits, flat affect Lungs clear bilaterally Incisions well-healed  Diagnostic Tests: I personally viewed her chest x-ray.  Shows no active disease.  Impression: Barbara Clark is a 46 year old woman with a history of type 1 diabetes with neuropathy, hypertension, anxiety, depression, sleep apnea, and a lipoma of the chest wall.  She had a resection of the chest wall lipoma in November 2022.  The lipoma had both intra and extrapleural components.  Her surgery went well.  She had some paresthesias postoperatively and required Lyrica for a while.  Currently she still has some numbness in that nerve distribution but is not having any significant pain.  She stopped taking Lyrica.  She had a benign lipoma but we really did not have any significant margin given the nature of the lesion.  Unlikely to recur but I do think we should keep an eye on that area to make  sure nothing recurs.  Plan:  Return in 1 year with PA and lateral chest x-ray  [name], MD [facility] [phone]

## 2021-08-11 ENCOUNTER — Ambulatory Visit (INDEPENDENT_AMBULATORY_CARE_PROVIDER_SITE_OTHER): Payer: Medicaid Other | Admitting: Podiatry

## 2021-08-11 DIAGNOSIS — Z91199 Patient's noncompliance with other medical treatment and regimen due to unspecified reason: Secondary | ICD-10-CM

## 2021-08-11 NOTE — Progress Notes (Signed)
   Complete physical exam  Patient: Barbara Clark   DOB: 11/08/1996   46 y.o. Female  MRN: [id number]  Subjective:    No chief complaint on file.   Barbara Clark is a 46 y.o. female who presents today for a complete physical exam. She reports consuming a {diet types:17450} diet. {types:19826} She generally feels {DESC; WELL/FAIRLY WELL/POORLY:18703}. She reports sleeping {DESC; WELL/FAIRLY WELL/POORLY:18703}. She {does/does not:200015} have additional problems to discuss today.    Most recent fall risk assessment:    07/16/2021   10:42 AM  Fall Risk   Falls in the past year? 0  Number falls in past yr: 0  Injury with Fall? 0  Risk for fall due to : No Fall Risks  Follow up Falls evaluation completed     Most recent depression screenings:    07/16/2021   10:42 AM 06/06/2020   10:46 AM  PHQ 2/9 Scores  PHQ - 2 Score 0 0  PHQ- 9 Score 5     {VISON DENTAL STD PSA (Optional):27386}  {History (Optional):23778}  Patient Care Team: Jessup, Joy, NP as PCP - General (Nurse Practitioner)   Outpatient Medications Prior to Visit  Medication Sig   fluticasone (FLONASE) 50 MCG/ACT nasal spray Place 2 sprays into both nostrils in the morning and at bedtime. After 7 days, reduce to once daily.   norgestimate-ethinyl estradiol (SPRINTEC 28) 0.25-35 MG-MCG tablet Take 1 tablet by mouth daily.   Nystatin POWD Apply liberally to affected area 2 times per day   spironolactone (ALDACTONE) 100 MG tablet Take 1 tablet (100 mg total) by mouth daily.   No facility-administered medications prior to visit.    ROS        Objective:     There were no vitals taken for this visit. {Vitals History (Optional):23777}  Physical Exam   No results found for any visits on 08/21/21. {Show previous labs (optional):23779}    Assessment & Plan:    Routine Health Maintenance and Physical Exam  Immunization History  Administered Date(s) Administered   DTaP 01/22/1999, 03/20/1999,  05/29/1999, 02/12/2000, 08/28/2003   Hepatitis A 06/24/2007, 06/29/2008   Hepatitis B 11/09/1998, 12/17/1998, 05/29/1999   HiB (PRP-OMP) 01/22/1999, 03/20/1999, 05/29/1999, 02/12/2000   IPV 01/22/1999, 03/20/1999, 11/17/1999, 08/28/2003   Influenza,inj,Quad PF,6+ Mos 09/29/2013   Influenza-Unspecified 12/30/2011   MMR 11/16/2000, 08/28/2003   Meningococcal Polysaccharide 06/29/2011   Pneumococcal Conjugate-13 02/12/2000   Pneumococcal-Unspecified 05/29/1999, 08/12/1999   Tdap 06/29/2011   Varicella 11/17/1999, 06/24/2007    Health Maintenance  Topic Date Due   HIV Screening  Never done   Hepatitis C Screening  Never done   INFLUENZA VACCINE  08/19/2021   PAP-Cervical Cytology Screening  08/21/2021 (Originally 11/08/2019)   PAP SMEAR-Modifier  08/21/2021 (Originally 11/08/2019)   TETANUS/TDAP  08/21/2021 (Originally 06/28/2021)   HPV VACCINES  Discontinued   COVID-19 Vaccine  Discontinued    Discussed health benefits of physical activity, and encouraged her to engage in regular exercise appropriate for her age and condition.  Problem List Items Addressed This Visit   None Visit Diagnoses     Annual physical exam    -  Primary   Cervical cancer screening       Need for Tdap vaccination          No follow-ups on file.     Joy Jessup, NP   

## 2021-08-12 ENCOUNTER — Other Ambulatory Visit (HOSPITAL_COMMUNITY)
Admission: RE | Admit: 2021-08-12 | Discharge: 2021-08-12 | Disposition: A | Discharge status: Home / Self Care | Payer: Medicaid Other | Source: Ambulatory Visit | Attending: Family Medicine | Admitting: Family Medicine

## 2021-08-12 ENCOUNTER — Encounter: Payer: Self-pay | Admitting: Family Medicine

## 2021-08-12 ENCOUNTER — Ambulatory Visit (INDEPENDENT_AMBULATORY_CARE_PROVIDER_SITE_OTHER): Payer: Medicaid Other | Admitting: Family Medicine

## 2021-08-12 DIAGNOSIS — Z124 Encounter for screening for malignant neoplasm of cervix: Secondary | ICD-10-CM | POA: Insufficient documentation

## 2021-08-12 DIAGNOSIS — E1040 Type 1 diabetes mellitus with diabetic neuropathy, unspecified: Secondary | ICD-10-CM

## 2021-08-12 DIAGNOSIS — N2889 Other specified disorders of kidney and ureter: Secondary | ICD-10-CM | POA: Diagnosis not present

## 2021-08-12 LAB — POCT URINALYSIS DIP (MANUAL ENTRY)
Bilirubin, UA: NEGATIVE
Glucose, UA: 500 mg/dL — AB
Ketones, POC UA: NEGATIVE mg/dL
Leukocytes, UA: NEGATIVE
Nitrite, UA: POSITIVE — AB
Protein Ur, POC: NEGATIVE mg/dL
Spec Grav, UA: 1.015 (ref 1.010–1.025)
Urobilinogen, UA: 1 E.U./dL
pH, UA: 6 (ref 5.0–8.0)

## 2021-08-12 LAB — POCT UA - MICROSCOPIC ONLY
RBC, Urine, Miroscopic: NONE SEEN (ref 0–2)
WBC, Ur, HPF, POC: NONE SEEN (ref 0–5)

## 2021-08-12 NOTE — Progress Notes (Signed)
    SUBJECTIVE:   CHIEF COMPLAINT / HPI:   Urine odor: She is c/o about her urine having a very concentrated odor. This improves after drinking. She denies dysuria or blood in her urine. She feels this is related to some of the medications she is on. She takes the Daily Diabetes Health pack MVI, which consists of MVI, Magnesium, Vit C, Alpha Lipoic acid, and Chromium. She held this medication today to see if it was the cause of her concerns.  DM1: Compliant with urology f/u.  HM: Due for PAP  PERTINENT  PMH / PSH: PMHX reviewed.  OBJECTIVE:   BP 102/63   Pulse 94   Ht '5\' 7"'$  (1.702 m)   Wt 238 lb 9.6 oz (108.2 kg)   SpO2 99%   BMI 37.37 kg/m   Physical Exam Vitals and nursing note reviewed. Exam conducted with a chaperone present Lavell Anchors).  Cardiovascular:     Rate and Rhythm: Normal rate and regular rhythm.     Heart sounds: Normal heart sounds. No murmur heard. Pulmonary:     Effort: Pulmonary effort is normal. No respiratory distress.     Breath sounds: Normal breath sounds. No wheezing.  Abdominal:     General: Bowel sounds are normal. There is no distension.     Palpations: Abdomen is soft. There is no mass.     Tenderness: There is no abdominal tenderness.  Genitourinary:    Vagina: Normal.     Cervix: Normal.     Comments: PAP completed      ASSESSMENT/PLAN:   Concentrated urine/Odor: Uncertain if this is related to her OTC supplements. UA shows cloudy urine, glucose 500, + Nitrite and trace RBC Urine sent for culture. I called to discuss her urine result. I will contact her with culture result. Glucosuria due to DM. F/U with endo.   Cervical Cancer Screening: PAP completed STD screening completed per her request. I will contact her soon with her results.  DM neuropathy, type I diabetes mellitus Stable. Continue f/u with Endocrinology.  Diabetes mellitus type I ([place]) Follow up with endocrinology     Andrena Mews, MD [place]

## 2021-08-12 NOTE — Assessment & Plan Note (Signed)
Stable. Continue f/u with Endocrinology.

## 2021-08-12 NOTE — Assessment & Plan Note (Signed)
Follow up with endocrinology

## 2021-08-12 NOTE — Patient Instructions (Signed)
Pap Test Why am I having this test? A Pap test, also called a Pap smear, is a screening test to check for signs of: Infection. Cancer of the cervix. The cervix is the lower part of the uterus that opens into the vagina. Changes that may be a sign that cancer is developing (precancerous changes). Women need this test on a regular basis. In general, you should have a Pap test every 3 years until you reach menopause or age 44. Women aged 30-60 may choose to have their Pap test done at the same time as an HPV (human papillomavirus) test every 5 years (instead of every 3 years). Your health care provider may recommend having Pap tests more or less often depending on your medical conditions and past Pap test results. What is being tested? Cervical cells are tested for signs of infection or abnormalities. What kind of sample is taken?  Your health care provider will collect a sample of cells from the surface of your cervix. This will be done using a small cotton swab, plastic spatula, or brush that is inserted into your vagina using a tool called a speculum. This sample is often collected during a pelvic exam, when you are lying on your back on an exam table with your feet in footrests (stirrups). In some cases, fluids (secretions) from the cervix or vagina may also be collected. How do I prepare for this test? Be aware of where you are in your menstrual cycle. If you are menstruating on the day of the test, you may be asked to reschedule. You may need to reschedule if you have a known vaginal infection on the day of the test. Follow instructions from your health care provider about: Changing or stopping your regular medicines. Some medicines can cause abnormal test results, such as vaginal medicines and tetracycline. Avoiding douching 2-3 days before or the day of the test. Tell a health care provider about: Any allergies you have. All medicines you are taking, including vitamins, herbs, eye drops,  creams, and over-the-counter medicines. Any bleeding problems you have. Any surgeries you have had. Any medical conditions you have. Whether you are pregnant or may be pregnant. How are the results reported? Your test results will be reported as either abnormal or normal. What do the results mean? A normal test result means that you do not have signs of cancer of the cervix. An abnormal result may mean that you have: Cancer. A Pap test by itself is not enough to diagnose cancer. You will have more tests done if cancer is suspected. Precancerous changes in your cervix. Inflammation of the cervix. An STI (sexually transmitted infection). A fungal infection. A parasite infection. Talk with your health care provider about what your results mean. In some cases, your health care provider may do more testing to confirm the results. Questions to ask your health care provider Ask your health care provider, or the department that is doing the test: When will my results be ready? How will I get my results? What are my treatment options? What other tests do I need? What are my next steps? Summary In general, women should have a Pap test every 3 years until they reach menopause or age 65. Your health care provider will collect a sample of cells from the surface of your cervix. This will be done using a small cotton swab, plastic spatula, or brush. In some cases, fluids (secretions) from the cervix or vagina may also be collected. This information is not  intended to replace advice given to you by your health care provider. Make sure you discuss any questions you have with your health care provider. Document Revised: 04/05/2020 Document Reviewed: 04/05/2020 Elsevier Patient Education  2023 Elsevier Inc.  

## 2021-08-13 LAB — CERVICOVAGINAL ANCILLARY ONLY
Chlamydia: NEGATIVE
Comment: NEGATIVE
Comment: NEGATIVE
Comment: NORMAL
Neisseria Gonorrhea: NEGATIVE
Trichomonas: NEGATIVE

## 2021-08-14 LAB — URINE CULTURE

## 2021-08-15 LAB — CYTOLOGY - PAP
Comment: NEGATIVE
Diagnosis: NEGATIVE
High risk HPV: NEGATIVE

## 2021-08-20 ENCOUNTER — Encounter: Payer: Self-pay | Admitting: Family Medicine

## 2021-08-23 ENCOUNTER — Other Ambulatory Visit: Payer: Self-pay | Admitting: Family Medicine

## 2021-08-24 ENCOUNTER — Other Ambulatory Visit: Payer: Self-pay | Admitting: Family Medicine

## 2021-08-24 DIAGNOSIS — M654 Radial styloid tenosynovitis [de Quervain]: Secondary | ICD-10-CM

## 2021-08-25 ENCOUNTER — Ambulatory Visit: Payer: Medicaid Other | Admitting: Family Medicine

## 2021-08-25 ENCOUNTER — Encounter: Payer: Self-pay | Admitting: Family Medicine

## 2021-08-25 VITALS — BP 121/76 | Ht 67.0 in | Wt 243.0 lb

## 2021-08-25 DIAGNOSIS — M654 Radial styloid tenosynovitis [de Quervain]: Secondary | ICD-10-CM

## 2021-08-25 NOTE — Progress Notes (Signed)
PCP: [name], MD  Subjective:   HPI: Patient is a 46 y.o. female here for left wrist pain.  7/7: Patient was in clinic today to be evaluated for left wrist pain.  She was previously evaluated by family medicine clinic earlier this morning and sent down here for further management.  Patient first developed bilateral wrist pain approximately 6 years ago.  She was told she had carpal tunnel.  She saw a physician in [facility] who gave her cortisone injections which relieved her pain but unfortunately spiked her sugars as she is a type I diabetic.  She was told by her physician to no longer receive steroid injections due to the increase of sugars.  She has been pain-free in her wrists ever since then up until about 2 to 3 months ago.  She started developing bilateral wrist pain and swelling.  The pain is most significant in the left wrist over the dorsal aspect of the thumb and first MCP joint.  The pain extends approximately up to the level of the distal radius.  She says she has great pain with lifting and gripping.  She thought she had a recurrence of her carpal tunnel so she got a wrist brace to wear during the day but that did not give her any relief.  She is returning to work next week at [employer] and says she is looking for pain relief as soon as possible.  Due to her type 1 diabetes, she does have diabetic neuropathy.  She otherwise denies radiation of the pain, numbness or tingling of the other fingers.  8/7: Patient reports she's doing better following toradol 1st compartment injection and thumb spica brace. She did have to use the brace 2-3 days this week because of pain but overall much improved. Using voltaren gel and icing. No new injuries.  Past Medical History:  Diagnosis Date   Abnormality of gait 12/26/2012   Anxiety    Chronic leg pain 04/27/2014   Depression    Diabetes mellitus    Type I   DKA (diabetic ketoacidosis) ([place]) 09/03/2017   Dyspnea    HSV-2  (herpes simplex virus 2) infection 2012   serology   Hypertension    Limb weakness 08/04/2013   Menorrhagia 04/27/2014   PONV (postoperative nausea and vomiting)    Right leg weakness 02/20/2014   Sleep apnea 10/15/2020   Stuttering 02/02/2013    Current Outpatient Medications on File Prior to Visit  Medication Sig Dispense Refill   ACCU-CHEK GUIDE test strip      Alpha-Lipoic Acid 100 MG CAPS Take by mouth.     Ascorbic Acid (VITAMIN C) 100 MG tablet Take 100 mg by mouth daily. (Patient not taking: Reported on 08/12/2021)     atorvastatin (LIPITOR) 10 MG tablet TAKE 1 TABLET BY MOUTH EVERYDAY AT BEDTIME 90 tablet 2   Chromium 1 MG CAPS Take by mouth.     diclofenac Sodium (VOLTAREN) 1 % GEL Apply 2 g topically 4 (four) times daily. 50 g 3   DULoxetine (CYMBALTA) 60 MG capsule Take 60 mg by mouth daily.     insulin aspart (NOVOLOG) 100 UNIT/ML injection Inject 0-100 Units into the skin continuous. Via pump     Insulin Human (INSULIN PUMP) SOLN Inject into the skin. Patient uses novolog 1.6 units/hr in insulin pump.     lidocaine (LIDODERM) 5 % Place 1 patch onto the skin daily. Remove & Discard patch within 12 hours or as directed by MD 10  patch 1   losartan (COZAAR) 25 MG tablet TAKE 1 TABLET BY MOUTH EVERYDAY AT BEDTIME 90 tablet 0   Magnesium 100 MG CAPS Take by mouth. (Patient not taking: Reported on 08/12/2021)     Multiple Vitamin (MULTIVITAMIN) capsule Take 1 capsule by mouth daily. (Patient not taking: Reported on 08/12/2021)     naproxen (NAPROSYN) 500 MG tablet Take 1 tablet (500 mg total) by mouth 2 (two) times daily with a meal. 30 tablet 0   Omega-3 Fatty Acids (FISH OIL) 500 MG CAPS Take by mouth.     pregabalin (LYRICA) 25 MG capsule Take 1 capsule (25 mg total) by mouth 2 (two) times daily. (Patient not taking: Reported on 07/29/2021) 60 capsule 3   vitamin B-12 (CYANOCOBALAMIN) 500 MCG tablet Take 1,000 mcg by mouth daily. Gummies (Patient not taking: Reported on 08/12/2021)      No current facility-administered medications on file prior to visit.    Past Surgical History:  Procedure Laterality Date   DENTAL RESTORATION/EXTRACTION WITH X-RAY Left 11/06/2020   EXAM UNDER ANESTHESIA WITH MANIPULATION OF SHOULDER  03/2020   EYE SURGERY Bilateral 2021   cataracts   INTERCOSTAL NERVE BLOCK Right 11/28/2020   Procedure: INTERCOSTAL NERVE BLOCK;  Surgeon: [name], MD;  Location: [facility] OR;  Service: Thoracic;  Laterality: Right;   TUBAL LIGATION      No Known Allergies  BP 121/76   Ht '5\' 7"'$  (1.702 m)   Wt 243 lb (110.2 kg)   BMI 38.06 kg/m       No data to display              No data to display              Objective:  Physical Exam:  Gen: NAD, comfortable in exam room  Left wrist: No deformity, swelling, bruising. FROM with 5/5 strength. Tenderness to palpation mildly 1st dorsal compartment.  No 1st CMC, carpal tunnel tenderness. NVI distally. Mildly positive finkelsteins.  Negative tinels.   Assessment & Plan:  1. Left deQuervain's tenosynovitis - improving following 1st dorsal compartment injection of toradol and thumb spica brace.  Continue with brace, icing, voltaren gel.  Consider steroid injection but need to take care because of her Type 1 diabetes.  F/u prn if she continues to do well.

## 2021-08-25 NOTE — Patient Instructions (Signed)
I'm glad you're doing better. Continue using the brace when you need to as you have been. Icing, voltaren gel if needed. Call me if this gets worse and would consider steroid injection into the compartment with a close monitoring of your sugars. If that injection didn't help enough the next step beyond that is to see a Copy. Otherwise follow up as needed.

## 2021-08-28 DIAGNOSIS — E1065 Type 1 diabetes mellitus with hyperglycemia: Secondary | ICD-10-CM | POA: Diagnosis not present

## 2021-09-29 DIAGNOSIS — E1065 Type 1 diabetes mellitus with hyperglycemia: Secondary | ICD-10-CM | POA: Diagnosis not present

## 2021-10-29 DIAGNOSIS — E1065 Type 1 diabetes mellitus with hyperglycemia: Secondary | ICD-10-CM | POA: Diagnosis not present

## 2021-10-30 DIAGNOSIS — G4733 Obstructive sleep apnea (adult) (pediatric): Secondary | ICD-10-CM | POA: Diagnosis not present

## 2021-10-31 DIAGNOSIS — Z8639 Personal history of other endocrine, nutritional and metabolic disease: Secondary | ICD-10-CM | POA: Diagnosis not present

## 2021-10-31 DIAGNOSIS — E1042 Type 1 diabetes mellitus with diabetic polyneuropathy: Secondary | ICD-10-CM | POA: Diagnosis not present

## 2021-10-31 DIAGNOSIS — E1065 Type 1 diabetes mellitus with hyperglycemia: Secondary | ICD-10-CM | POA: Diagnosis not present

## 2021-10-31 DIAGNOSIS — Z794 Long term (current) use of insulin: Secondary | ICD-10-CM | POA: Diagnosis not present

## 2021-11-19 ENCOUNTER — Encounter: Payer: Self-pay | Admitting: Podiatry

## 2021-11-19 ENCOUNTER — Ambulatory Visit: Payer: Medicaid Other | Admitting: Podiatry

## 2021-11-19 DIAGNOSIS — M2011 Hallux valgus (acquired), right foot: Secondary | ICD-10-CM

## 2021-11-19 DIAGNOSIS — E119 Type 2 diabetes mellitus without complications: Secondary | ICD-10-CM

## 2021-11-19 DIAGNOSIS — M2012 Hallux valgus (acquired), left foot: Secondary | ICD-10-CM

## 2021-11-19 DIAGNOSIS — G4733 Obstructive sleep apnea (adult) (pediatric): Secondary | ICD-10-CM | POA: Diagnosis not present

## 2021-11-19 DIAGNOSIS — E1142 Type 2 diabetes mellitus with diabetic polyneuropathy: Secondary | ICD-10-CM

## 2021-11-19 NOTE — Patient Instructions (Signed)
Shoe and Sneaker Recommendations:  *Purchase shoes/sneakers with mesh (soft, stretchable) uppers. Avoid leather sneakers.*   1. New Balance Sneakers 600 Series or Higher (*Purchase sneakers with mesh (soft, stretchable) uppers. Avoid leather sneakers.*)  A. [email or web address].com B. Omega Sports C. Fleet Feet,  D. [employer] and [place]. [employer] Goods F. REI,  G. [employer]arel in [place]    2.  [place] Beast: *Purchase sneakers with mesh (soft, stretchable) uppers. Avoid leather sneakers.* ( A. Omega Sports B. Fleet Feet,  C. [employer] Outdoors D. Dick[employer] Sporting [place] and Apparel in [place] Diabetes Mellitus and Foot Care Diabetes, also called diabetes mellitus, may cause problems with your feet and legs because of poor blood flow (circulation). Poor circulation may make your skin: Become thinner and drier. Break more easily. Heal more slowly. Peel and crack. You may also have nerve damage (neuropathy). This can cause decreased feeling in your legs and feet. This means that you may not notice minor injuries to your feet that could lead to more serious problems. Finding and treating problems early is the best way to prevent future foot problems. How to care for your feet Foot hygiene  Wash your feet daily with warm water and mild soap. Do not use hot water. Then, pat your feet and the areas between your toes until they are fully dry. Do not soak your feet. This can dry your skin. Trim your toenails straight across. Do not dig under them or around the cuticle. File the edges of your nails with an emery board or nail file. Apply a moisturizing lotion or petroleum jelly to the skin on your feet and to dry, brittle toenails. Use lotion that does not contain alcohol and is unscented. Do not apply lotion between your toes. Shoes and socks Wear clean socks or stockings every day. Make sure they are not too tight. Do not  wear knee-high stockings. These may decrease blood flow to your legs. Wear shoes that fit well and have enough cushioning. Always look in your shoes before you put them on to be sure there are no objects inside. To break in new shoes, wear them for just a few hours a day. This prevents injuries on your feet. Wounds, scrapes, corns, and calluses  Check your feet daily for blisters, cuts, bruises, sores, and redness. If you cannot see the bottom of your feet, use a mirror or ask someone for help. Do not cut off corns or calluses or try to remove them with medicine. If you find a minor scrape, cut, or break in the skin on your feet, keep it and the skin around it clean and dry. You may clean these areas with mild soap and water. Do not clean the area with peroxide, alcohol, or iodine. If you have a wound, scrape, corn, or callus on your foot, look at it several times a day to make sure it is healing and not infected. Check for: Redness, swelling, or pain. Fluid or blood. Warmth. Pus or a bad smell. General tips Do not cross your legs. This may decrease blood flow to your feet. Do not use heating pads or hot water bottles on your feet. They may burn your skin. If you have lost feeling in your feet or legs, you may not know this is happening until it is too late. Protect your feet from hot and cold by wearing shoes, such as at the beach or on hot pavement. Schedule  a complete foot exam at least once a year or more often if you have foot problems. Report any cuts, sores, or bruises to your health care provider right away. Where to find more information American Diabetes Association: diabetes.org Association of Diabetes Care & Education Specialists: diabeteseducator.org Contact a health care provider if: You have a condition that increases your risk of infection, and you have any cuts, sores, or bruises on your feet. You have an injury that is not healing. You have redness on your legs or feet. You  feel burning or tingling in your legs or feet. You have pain or cramps in your legs and feet. Your legs or feet are numb. Your feet always feel cold. You have pain around any toenails. Get help right away if: You have a wound, scrape, corn, or callus on your foot and: You have signs of infection. You have a fever. You have a red line going up your leg. This information is not intended to replace advice given to you by your health care provider. Make sure you discuss any questions you have with your health care provider. Document Revised: 07/09/2021 Document Reviewed: 07/09/2021 Elsevier Patient Education  [place] A bunion (hallux valgus) is a bump that forms slowly on the inner side of the big toe joint. It occurs when the big toe turns toward the second toe. Bunions may be small at first, but they often get larger over time. They can make walking painful. What are the causes? This condition may be caused by: Wearing narrow or pointed shoes that force the big toe to press against the other toes. Abnormal foot development that causes the foot to roll inward. Changes in the foot that are caused by certain diseases, such as rheumatoid arthritis or polio. A foot injury. What increases the risk? The following factors may make you more likely to develop this condition: Wearing shoes that squeeze the toes together. Having certain diseases, such as: Rheumatoid arthritis. Polio. Cerebral palsy. Having family members who have bunions. Being born with abnormally shaped feet (a foot deformity), such as flat feet or low arches. Doing activities that put a lot of pressure on the feet, such as ballet dancing. What are the signs or symptoms?  The main symptom of this condition is a bump on your big toe that you can notice. Other symptoms may include: Pain. Redness and inflammation around your big toe. Thick or hardened skin on your big toe or between your toes. Stiffness  or loss of motion in your big toe. Trouble with walking. How is this diagnosed? This condition may be diagnosed based on your symptoms, medical history, and activities. You may also have tests and imaging, such as: X-rays. These allow your health care provider to check the position of the bones in your foot and look for damage to your joint. They also help your health care provider determine the severity of your bunion and the best way to treat it. Joint aspiration. In this test, a sample of fluid is removed from the toe joint. This test may be done if you are in a lot of pain. It helps rule out diseases that cause painful swelling of the joints, such as arthritis or gout. How is this treated? Treatment depends on the severity of your symptoms. The goal of treatment is to relieve symptoms and prevent your bunion from getting worse. Your health care provider may recommend: Wearing shoes that have a wide toe box, or using  bunion pads to cushion the affected area. Taping your toes together to keep them in a normal position. Placing a device inside your shoe (orthotic device) to help reduce pressure on your toe joint. Taking medicine to ease pain and inflammation. Putting ice or heat on the affected area. Doing stretching exercises. Surgery, for severe cases. Follow these instructions at home: Managing pain, stiffness, and swelling     If directed, put ice on the painful area. To do this: Put ice in a plastic bag. Place a towel between your skin and the bag. Leave the ice on for 20 minutes, 2-3 times a day. Remove the ice if your skin turns bright red. This is very important. If you cannot feel pain, heat, or cold, you have a greater risk of damage to the area. If directed, apply heat to the affected area before you exercise. Use the heat source that your health care provider recommends, such as a moist heat pack or a heating pad. Place a towel between your skin and the heat source. Leave the  heat on for 20-30 minutes. Remove the heat if your skin turns bright red. This is especially important if you are unable to feel pain, heat, or cold. You have a greater risk of getting burned. General instructions Do exercises as told by your health care provider. Support your toe joint with proper footwear, shoe padding, or taping as told by your health care provider. Take over-the-counter and prescription medicines only as told by your health care provider. Do not use any products that contain nicotine or tobacco, such as cigarettes, e-cigarettes, and chewing tobacco. If you need help quitting, ask your health care provider. Keep all follow-up visits. This is important. Contact a health care provider if: Your symptoms get worse. Your symptoms do not improve in 2 weeks. Get help right away if: You have severe pain and trouble with walking. Summary A bunion is a bump on the inner side of the big toe joint that forms when the big toe turns toward the second toe. Bunions can make walking painful. Treatment depends on the severity of your symptoms. Support your toe joint with proper footwear, shoe padding, or taping as told by your health care provider. This information is not intended to replace advice given to you by your health care provider. Make sure you discuss any questions you have with your health care provider. Document Revised: 05/12/2019 Document Reviewed: 05/12/2019 Elsevier Patient Education  [place].

## 2021-11-19 NOTE — Progress Notes (Signed)
ANNUAL DIABETIC FOOT EXAM  Subjective: Barbara Clark presents today for annual diabetic foot examination.  Chief Complaint  Patient presents with   Nail Problem    Diabetic foot care [id number] A1C-7 OR 8 PCP-[place] PCP VST-Early 2023    Patient confirms h/o diabetes.  Patient relates 26 year h/o diabetes.  Patient denies any h/o foot wounds.  Patient endorses numbness, tingling, and burning in great toes right >left.  Patient has been diagnosed with neuropathy and it is managed with duloxetine.  Risk factors: diabetes, neuropathy, HTN, h/o tobacco use in remission.  Barbara Feil, MD is patient's PCP. Last visit was August 12, 2021.  Past Medical History:  Diagnosis Date   Abnormality of gait 12/26/2012   Anxiety    Chronic leg pain 04/27/2014   Depression    Diabetes mellitus    Type I   DKA (diabetic ketoacidosis) ([place]) 09/03/2017   Dyspnea    HSV-2 (herpes simplex virus 2) infection 2012   serology   Hypertension    Limb weakness 08/04/2013   Menorrhagia 04/27/2014   PONV (postoperative nausea and vomiting)    Right leg weakness 02/20/2014   Sleep apnea 10/15/2020   Stuttering 02/02/2013   Patient Active Problem List   Diagnosis Date Noted   Acute midline low back pain without sciatica 07/29/2021   History of corticosteroid therapy 07/29/2021   History of endocrine disorder 07/29/2021   Long term (current) use of insulin ([place]) 07/29/2021   Left wrist pain 07/25/2021   Neuropathy 01/29/2021   Mass of right chest wall 11/28/2020   OSA (obstructive sleep apnea) 10/15/2020   Cellulitis 06/11/2020   Right rotator cuff tear 05/10/2020   Pleural mass 05/10/2020   Encounter for administration of COVID-19 vaccine 03/15/2020   Chronic cough 12/22/2019   Cataract 09/26/2019   De Quervain's tenosynovitis, bilateral 12/20/2018   Carpal tunnel syndrome on right 11/01/2018   Obesity (BMI 30-39.9) 10/12/2017   Back pain, lumbosacral 07/06/2017   Vaginitis  03/27/2016   Right shoulder pain 05/07/2015   Psychologic conversion disorder, 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   DM neuropathy, type I diabetes mellitus ([place]) 03/13/2014   Diabetes mellitus type I ([place]) 03/18/2006   Major depressive disorder, recurrent episode ([place]) 03/18/2006   Past Surgical History:  Procedure Laterality Date   DENTAL RESTORATION/EXTRACTION WITH X-RAY Left 11/06/2020   EXAM UNDER ANESTHESIA WITH MANIPULATION OF SHOULDER  03/2020   EYE SURGERY Bilateral 2021   cataracts   INTERCOSTAL NERVE BLOCK Right 11/28/2020   Procedure: INTERCOSTAL NERVE BLOCK;  Surgeon: Barbara Nakayama, MD;  Location: [facility] OR;  Service: Thoracic;  Laterality: Right;   TUBAL LIGATION     Current Outpatient Medications on File Prior to Visit  Medication Sig Dispense Refill   ACCU-CHEK GUIDE test strip      Alpha-Lipoic Acid 100 MG CAPS Take by mouth.     Ascorbic Acid (VITAMIN C) 100 MG tablet Take 100 mg by mouth daily. (Patient not taking: Reported on 08/12/2021)     atorvastatin (LIPITOR) 10 MG tablet TAKE 1 TABLET BY MOUTH EVERYDAY AT BEDTIME 90 tablet 2   Chromium 1 MG CAPS Take by mouth.     DULoxetine (CYMBALTA) 60 MG capsule Take 60 mg by mouth daily.     insulin aspart (NOVOLOG) 100 UNIT/ML injection Inject 0-100 Units into the skin continuous. Via pump     Insulin Human (INSULIN PUMP) SOLN Inject into the skin. Patient uses novolog 1.6 units/hr in insulin pump.  LANTUS 100 UNIT/ML injection Inject into the skin.     losartan (COZAAR) 25 MG tablet TAKE 1 TABLET BY MOUTH EVERYDAY AT BEDTIME 90 tablet 1   Magnesium 100 MG CAPS Take by mouth. (Patient not taking: Reported on 08/12/2021)     Multiple Vitamin (MULTIVITAMIN) capsule Take 1 capsule by mouth daily. (Patient not taking: Reported on 08/12/2021)     Omega-3 Fatty Acids (FISH OIL) 500 MG CAPS Take by mouth.     pregabalin (LYRICA) 25 MG capsule Take 1 capsule (25 mg total) by mouth 2 (two) times daily. (Patient not taking: Reported on  07/29/2021) 60 capsule 3   vitamin B-12 (CYANOCOBALAMIN) 500 MCG tablet Take 1,000 mcg by mouth daily. Gummies (Patient not taking: Reported on 08/12/2021)     No current facility-administered medications on file prior to visit.    No Known Allergies Social History   Occupational History   Not on file  Tobacco Use   Smoking status: Former    Packs/day: 1.00    Years: 12.00    Total pack years: 12.00    Types: Cigarettes    Quit date: 07/14/2017    Years since quitting: 4.3   Smokeless tobacco: Never  Vaping Use   Vaping Use: Never used  Substance and Sexual Activity   Alcohol use: Not Currently    Comment: rare but on occasion   Drug use: No   Sexual activity: Yes    Birth control/protection: Surgical, I.U.D.   Family History  Problem Relation Age of Onset   Diabetes Mother    Hypertension Mother    Diabetes Maternal Grandmother    Immunization History  Administered Date(s) Administered   Influenza Split 02/11/2012   Influenza,inj,Quad PF,6+ Mos 02/20/2014, 10/26/2014, 09/20/2015, 09/14/2017, 10/07/2018, 12/22/2019, 09/06/2020   MMR 03/20/2020   PFIZER Comirnaty(Gray Top)Covid-19 Tri-Sucrose Vaccine 03/15/2020, 04/05/2020, 09/06/2020   PNEUMOCOCCAL CONJUGATE-20 01/29/2021   Pfizer Covid-19 Vaccine Bivalent Booster 62yr & up 01/08/2021   Pneumococcal Polysaccharide-23 02/11/2012   Tdap 09/02/2010, 02/16/2020    Review of Systems: Negative except as noted in the HPI.   Objective: There were no vitals filed for this visit.  Barbara Nodarseis a pleasant 46y.o. female in NAD. AAO X 3.  Vascular Examination: Capillary refill time immediate b/l.Vascular status intact b/l with palpable pedal pulses. Pedal hair present b/l. No edema. No pain with calf compression b/l. Skin temperature gradient WNL b/l. No cyanosis or clubbing noted b/l LE.  Neurological Examination: Sensation grossly intact b/l with 10 gram monofilament. Vibratory sensation intact b/l.   Dermatological  Examination: Pedal skin with normal turgor, texture and tone b/l. Toenails 1-5 b/l thick, discolored, elongated with subungual debris and pain on dorsal palpation. No open wounds b/l lower extremities. No interdigital macerations noted b/l LE. No hyperkeratotic nor porokeratotic lesions present on today's visit.  Musculoskeletal Examination: Normal muscle strength 5/5 to all lower extremity muscle groups bilaterally. Hallux valgus with bunion deformity noted b/l lower extremities right >left. No pain, crepitus or joint limitation noted with ROM b/l LE.  Patient ambulates independently without assistive aids.  Radiographs: None  Footwear Assessment: Does the patient wear appropriate shoes? Yes. Does the patient need inserts/orthotics? No.  ADA Risk Categorization: Low Risk :  Patient has all of the following: Intact protective sensation No prior foot ulcer  No severe deformity Pedal pulses present  Assessment: 1. Hallux valgus, acquired, bilateral   2. Diabetic polyneuropathy associated with type 2 diabetes mellitus ([place]   3. Encounter for diabetic  foot exam [facility])     Plan: -Patient was evaluated and treated. All patient's and/or POA's questions/concerns answered on today's visit. -. -Diabetic foot examination performed today. -Discussed and educated patient on diabetic foot care, especially with  regards to the vascular, neurological and musculoskeletal systems. -Discussed diabetic foot care principles. Literature dispensed on today. -Shoe recommendations given for New Balance or [employer]. -Patient scheduled to see Dr. Jacqualyn Posey in our office for follow up of bilateral bunion deformity. -Patient/POA to call should there be question/concern in the interim. Return if symptoms worsen or fail to improve.  Marzetta Board, DPM

## 2021-11-27 ENCOUNTER — Ambulatory Visit (INDEPENDENT_AMBULATORY_CARE_PROVIDER_SITE_OTHER): Payer: Medicaid Other

## 2021-11-27 ENCOUNTER — Ambulatory Visit: Payer: Medicaid Other | Admitting: Podiatry

## 2021-11-27 ENCOUNTER — Encounter: Payer: Self-pay | Admitting: Podiatry

## 2021-11-27 DIAGNOSIS — M216X1 Other acquired deformities of right foot: Secondary | ICD-10-CM | POA: Diagnosis not present

## 2021-11-27 DIAGNOSIS — M216X2 Other acquired deformities of left foot: Secondary | ICD-10-CM

## 2021-11-27 DIAGNOSIS — R609 Edema, unspecified: Secondary | ICD-10-CM

## 2021-11-27 DIAGNOSIS — M21619 Bunion of unspecified foot: Secondary | ICD-10-CM

## 2021-11-27 MED ORDER — DICLOFENAC SODIUM 1 % EX GEL: 2.0000 g | Freq: Four times a day (QID) | CUTANEOUS | 2 refills | Status: DC

## 2021-11-27 NOTE — Progress Notes (Signed)
Subjective: Chief Complaint  Patient presents with   Bunions    Bunion Pain Right ober left x 2 months. Pt states she purchased a new pair of shoes that caused pain that had not resolved. Sharp aching pain with edema tingling and numbness.    46 year old presents the office today with above concerns.  She states that she has pain in the bunion area. She keeps hitting the area, but no specific injury. No treatment. She has noticed some feet swelling but not sure if that is from weight gain. She has been staning a lot at work. She started wearing a compression socks which help some.    Objective: AAO x3, NAD DP/PT pulses palpable bilaterally, CRT less than 3 seconds Some mild lower extremity edema is present.  Calf is supple. Moderate bunion present on the right foot.  There is no pain or crepitation with first MPJ range of motion.  No hypermobility first ray.  No edema, erythema.  No other areas of discomfort noted bilaterally.  MMT 5/5. No pain with calf compression, swelling, warmth, erythema  Assessment: 46 year old female with bunion, capsulitis; swelling  Plan: -All treatment options discussed with the patient including all alternatives, risks, complications.  -X-rays were obtained reviewed.  Moderate bunion present right foot.  No evidence of acute fracture. -Discussed conservative as well as surgical treatment options.  Reviewed conservative care.  Discussed Voltaren gel locally.  Dispensed a bunion pad.  Discussed changing shoes to avoid pressure.  In the future consider surgical intervention if needed. -Prescription for compression socks given. -Patient encouraged to call the office with any questions, concerns, change in symptoms.   Trula Slade DPM

## 2021-11-27 NOTE — Patient Instructions (Signed)

## 2021-11-28 DIAGNOSIS — E1065 Type 1 diabetes mellitus with hyperglycemia: Secondary | ICD-10-CM | POA: Diagnosis not present

## 2021-11-29 ENCOUNTER — Other Ambulatory Visit: Payer: Self-pay | Admitting: Family Medicine

## 2021-12-19 DIAGNOSIS — G4733 Obstructive sleep apnea (adult) (pediatric): Secondary | ICD-10-CM | POA: Diagnosis not present

## 2021-12-27 ENCOUNTER — Other Ambulatory Visit: Payer: Self-pay | Admitting: Family Medicine

## 2021-12-29 DIAGNOSIS — E1065 Type 1 diabetes mellitus with hyperglycemia: Secondary | ICD-10-CM | POA: Diagnosis not present

## 2022-01-07 ENCOUNTER — Encounter: Payer: Self-pay | Admitting: Family Medicine

## 2022-01-07 ENCOUNTER — Ambulatory Visit (INDEPENDENT_AMBULATORY_CARE_PROVIDER_SITE_OTHER): Payer: Medicaid Other | Admitting: Family Medicine

## 2022-01-07 VITALS — BP 115/78 | HR 101 | Ht 67.0 in | Wt 238.8 lb

## 2022-01-07 DIAGNOSIS — T8332XA Displacement of intrauterine contraceptive device, initial encounter: Secondary | ICD-10-CM | POA: Diagnosis not present

## 2022-01-07 NOTE — Progress Notes (Signed)
   GYNECOLOGY PROBLEM  VISIT ENCOUNTER NOTE  Subjective:   Barbara Clark is a 46 y.o. [id number] female here for a problem GYN visit.  Current complaints: Attempted IUD removal in 2021-- no successful, US showed in uterus. It was recommended hysteroscopic removal and possible myomectomy at that time but patient was scared/nervous.     Denies abnormal vaginal bleeding, discharge, pelvic pain, problems with intercourse or other gynecologic concerns.    Gynecologic History Patient's last menstrual period was 01/02/2022 (exact date). Contraception: IUD  Health Maintenance Due  Topic Date Due   Diabetic kidney evaluation - Urine ACR  09/19/2016   OPHTHALMOLOGY EXAM  09/12/2020   INFLUENZA VACCINE  08/19/2021   COVID-19 Vaccine (5 - 2023-24 season) 09/19/2021   The following portions of the patient's history were reviewed and updated as appropriate: allergies, current medications, past family history, past medical history, past social history, past surgical history and problem list.  Review of Systems Pertinent items are noted in HPI.   Objective:  BP 115/78   Pulse (!) 101   Ht '5\' 7"'$  (1.702 m)   Wt 238 lb 12.8 oz (108.3 kg)   LMP 01/02/2022 (Exact Date)   BMI 37.40 kg/m  Gen: well appearing, NAD HEENT: no scleral icterus CV: RR Lung: Normal WOB Ext: warm well perfused  Assessment and Plan:   1. Intrauterine contraceptive device threads lost, initial encounter Reviewed need for updated imaging Discussed next appt with surgeon to discuss procedure for removal Patient has the goal of IUD removal and does not want hysterectomy.  - US PELVIC COMPLETE WITH TRANSVAGINAL; Future   Please refer to After Visit Summary for other counseling recommendations.   Return in about 4 weeks (around 02/04/2022) for With GYN surgeon for pre-op  hysterscopic removal of IUD. Future Appointments  Date Time Provider [place]  01/15/2022  8:30 AM [facility] 3 [facility]  02/04/2022  8:35 AM Griffin Basil, MD [facility]  02/19/2022  9:00 AM Deneise Lever, MD [facility] None     Caren Macadam, MD, MPH, ABFM Attending [place] for [facility]

## 2022-01-07 NOTE — Patient Instructions (Addendum)
Ultrasound Appointment on Thursday, December 28th, 2023 at 8:30AM at [facility].  Please arrive at 8:15 with a full bladder; enter the hospital through Entrance C.

## 2022-01-07 NOTE — Progress Notes (Signed)
Patient has a Social worker and is being treated for depression/anxiety. VD RN

## 2022-01-15 ENCOUNTER — Ambulatory Visit (HOSPITAL_COMMUNITY)
Admission: RE | Admit: 2022-01-15 | Discharge: 2022-01-15 | Disposition: A | Discharge status: Home / Self Care | Payer: Medicaid Other | Source: Ambulatory Visit | Attending: Family Medicine | Admitting: Family Medicine

## 2022-01-15 DIAGNOSIS — T8332XA Displacement of intrauterine contraceptive device, initial encounter: Secondary | ICD-10-CM | POA: Diagnosis not present

## 2022-01-15 DIAGNOSIS — N888 Other specified noninflammatory disorders of cervix uteri: Secondary | ICD-10-CM | POA: Diagnosis not present

## 2022-01-15 DIAGNOSIS — D251 Intramural leiomyoma of uterus: Secondary | ICD-10-CM | POA: Diagnosis not present

## 2022-01-15 DIAGNOSIS — H524 Presbyopia: Secondary | ICD-10-CM | POA: Diagnosis not present

## 2022-01-15 LAB — HM DIABETES EYE EXAM

## 2022-01-29 DIAGNOSIS — E1065 Type 1 diabetes mellitus with hyperglycemia: Secondary | ICD-10-CM | POA: Diagnosis not present

## 2022-02-04 ENCOUNTER — Encounter: Payer: Self-pay | Admitting: Obstetrics and Gynecology

## 2022-02-04 ENCOUNTER — Other Ambulatory Visit: Payer: Self-pay

## 2022-02-04 ENCOUNTER — Other Ambulatory Visit (HOSPITAL_COMMUNITY)
Admission: RE | Admit: 2022-02-04 | Discharge: 2022-02-04 | Disposition: A | Discharge status: Home / Self Care | Payer: Medicaid Other | Source: Ambulatory Visit | Attending: Obstetrics and Gynecology | Admitting: Obstetrics and Gynecology

## 2022-02-04 ENCOUNTER — Ambulatory Visit (INDEPENDENT_AMBULATORY_CARE_PROVIDER_SITE_OTHER): Payer: Medicaid Other | Admitting: Obstetrics and Gynecology

## 2022-02-04 VITALS — BP 123/79 | HR 91 | Ht 67.0 in | Wt 240.4 lb

## 2022-02-04 DIAGNOSIS — T8332XD Displacement of intrauterine contraceptive device, subsequent encounter: Secondary | ICD-10-CM

## 2022-02-04 DIAGNOSIS — T8332XA Displacement of intrauterine contraceptive device, initial encounter: Secondary | ICD-10-CM | POA: Insufficient documentation

## 2022-02-04 DIAGNOSIS — Z113 Encounter for screening for infections with a predominantly sexual mode of transmission: Secondary | ICD-10-CM | POA: Diagnosis not present

## 2022-02-04 DIAGNOSIS — G4733 Obstructive sleep apnea (adult) (pediatric): Secondary | ICD-10-CM | POA: Diagnosis not present

## 2022-02-04 DIAGNOSIS — N898 Other specified noninflammatory disorders of vagina: Secondary | ICD-10-CM | POA: Diagnosis not present

## 2022-02-04 NOTE — Progress Notes (Signed)
  CC: lost IUD Subjective:    Patient ID: Barbara Clark, female    DOB: 1975/06/18, 47 y.o.   MRN: [id number]  HPI 47 yo G3P#, SVD x 3< seen for management options for fibroid uterus and retained IUD.  Pt had started workup a few years ago, but stopped because she was afraid of having procedures or a hysterectomy.  Recent scan shows 3 medium to large sized fibroids and retained IUD .  Discussed treatment options including hysteroscopic removal of IUD followed by Sonata procedure, UFE or hysterectomy (likely robotic TLH).  Pt desires hysteroscopic removal and Sonata.  Procedure described in detail and Sonata consent signed.   Review of Systems     Objective:   Physical Exam Vitals:   02/04/22 0840  BP: 123/79  Pulse: 91   SSE: normal cervix, vaginal swabs taken, no IUD threads seen.   Bimanual exam shows moderately enlarged uterus.   CLINICAL DATA:  Missing IUD strings, LMP 01/06/2022, G3P3   EXAM: TRANSABDOMINAL AND TRANSVAGINAL ULTRASOUND OF PELVIS   TECHNIQUE: Both transabdominal and transvaginal ultrasound examinations of the pelvis were performed. Transabdominal technique was performed for global imaging of the pelvis including uterus, ovaries, adnexal regions, and pelvic cul-de-sac. It was necessary to proceed with endovaginal exam following the transabdominal exam to visualize the IUD and endometrium. Degradation of image quality secondary to bowel and body habitus.   COMPARISON:  10/11/2019   FINDINGS: Uterus   Measurements: 16.0 x 7.8 x 10.9 cm = volume: 650 mL. Anteverted. Heterogeneous myometrium. Fundal intramural leiomyoma 6.5 x 6.5 x 6.3 cm. Additional leiomyomata 5.4 cm posteriorly and 4.3 cm anteriorly. Nabothian cysts in cervix.   Endometrium   Thickness: 9 mm. No endometrial fluid or mass. IUD identified within endometrial canal at the mid upper uterus, though position of the limbs is poorly established, uncertain if extend to fundal aspect  of endometrial complex.   Right ovary   Measurements: 3.1 x 2.3 x 3.2 cm = volume: 11.6 mL. Limited visualization on transabdominal imaging due to bowel, nonvisualization on trans vaginal imaging. No gross mass.   Left ovary   Measurements: 2.6 x 1.6 x 2.6 cm = volume: 5.7 mL. Normal morphology without gross mass   Other findings   No free pelvic fluid or adnexal masses.   IMPRESSION: IUD identified within endometrial canal at mid upper portions, uncertain if extends to the fundal extent of the endometrial complex.   Three uterine leiomyomata as above.       Assessment & Plan:   1. Intrauterine contraceptive device threads lost, subsequent encounter Will attempt hysteroscopic removal  2. Routine screening for STI (sexually transmitted infection) Per pt request  - Cervicovaginal ancillary only( [place]) - Hepatitis B surface antigen - Hepatitis C antibody - RPR - HIV Antibody (routine testing w rflx)  3. Vaginal itching  - Cervicovaginal ancillary only( [place])  4. Uterine fibroids:   Will schedule for Sonata procedure with IUD removal.  Pt advised she will need medical clearance prior to procedure.    Griffin Basil, MD Faculty Attending, [facility] for [facility]

## 2022-02-05 ENCOUNTER — Telehealth: Payer: Self-pay

## 2022-02-05 DIAGNOSIS — E1042 Type 1 diabetes mellitus with diabetic polyneuropathy: Secondary | ICD-10-CM | POA: Diagnosis not present

## 2022-02-05 DIAGNOSIS — Z794 Long term (current) use of insulin: Secondary | ICD-10-CM | POA: Diagnosis not present

## 2022-02-05 DIAGNOSIS — Z8639 Personal history of other endocrine, nutritional and metabolic disease: Secondary | ICD-10-CM | POA: Diagnosis not present

## 2022-02-05 DIAGNOSIS — E1065 Type 1 diabetes mellitus with hyperglycemia: Secondary | ICD-10-CM | POA: Diagnosis not present

## 2022-02-05 LAB — CERVICOVAGINAL ANCILLARY ONLY
Bacterial Vaginitis (gardnerella): POSITIVE — AB
Chlamydia: NEGATIVE
Comment: NEGATIVE
Comment: NEGATIVE
Comment: NEGATIVE
Comment: NORMAL
Neisseria Gonorrhea: NEGATIVE
Trichomonas: NEGATIVE

## 2022-02-05 LAB — RPR: RPR Ser Ql: NONREACTIVE

## 2022-02-05 LAB — HEPATITIS B SURFACE ANTIGEN: Hepatitis B Surface Ag: NEGATIVE

## 2022-02-05 LAB — HIV ANTIBODY (ROUTINE TESTING W REFLEX): HIV Screen 4th Generation wRfx: NONREACTIVE

## 2022-02-05 LAB — HEPATITIS C ANTIBODY: Hep C Virus Ab: NONREACTIVE

## 2022-02-05 MED ORDER — METRONIDAZOLE 500 MG PO TABS: 500.0000 mg | ORAL_TABLET | Freq: Two times a day (BID) | ORAL | 0 refills | Status: DC

## 2022-02-05 NOTE — Telephone Encounter (Signed)
Call placed to pt. Spoke with pt. Pt given results per Dr Elgie Congo. Pt verbalized understanding and agreeable to plan of care. Rx Flagyl sent to pharmacy on file.   Colletta Maryland, RNC

## 2022-02-05 NOTE — Telephone Encounter (Signed)
-----  Message from Griffin Basil, MD sent at 02/05/2022  2:02 PM EST ----- Treat BV as seen on swab

## 2022-02-09 ENCOUNTER — Encounter: Payer: Self-pay | Admitting: Obstetrics and Gynecology

## 2022-02-11 DIAGNOSIS — E1065 Type 1 diabetes mellitus with hyperglycemia: Secondary | ICD-10-CM | POA: Diagnosis not present

## 2022-02-17 ENCOUNTER — Other Ambulatory Visit: Payer: Self-pay

## 2022-02-17 DIAGNOSIS — N898 Other specified noninflammatory disorders of vagina: Secondary | ICD-10-CM

## 2022-02-17 MED ORDER — FLUCONAZOLE 150 MG PO TABS: 150.0000 mg | ORAL_TABLET | Freq: Every day | ORAL | 0 refills | Status: DC

## 2022-02-17 NOTE — Progress Notes (Deleted)
HPI female Smoker followed for OSA, complicated by HTN, Pleural Lipoma resected, Loculated R Pleural Effusion,  DM 1/neuropathy, DeQuervain's Tenosynovitis, obesity, depression/history conversion Disorder HST 10/15/20- AHI 26/ hr, desaturation to 78%, body weight 255 lbs  ==============================================================    02/20/21- 47 yo female Smoker followed for OSA, complicated by HTN, Pleural Lipoma resected, Loculated R Pleural Effusion, Tobacco use,  DM 1/neuropathy, DeQuervain's Tenosynovitis, obesity, depression/history conversion Disorder CPAP 5-20/ [employer]   Luna Download- Body weight today-236 lbs Covid vax-4 Phizer Flu vax-had -----Patient is doing good, no concerns She reports she has gotten used to CPAP and sleeps better with it than without it.  We have requested download from [employer].  She is using nasal pillows mask we discussed flexibility of mask options. She continues working with endocrinology on her diabetes and says control is better since she got a pump.  Weight loss remains a long-term goal. She has followed with thoracic surgery after resection of a lipoma. CXR 01/30/21- IMPRESSION: Persistent small loculated right pleural effusion and adjacent basilar atelectasis.  02/19/22- 47 yo female Smoker followed for OSA, complicated by HTN, Pleural Lipoma resected, Loculated R Pleural Effusion, Tobacco use,  DM 1/neuropathy, DeQuervain's Tenosynovitis, obesity, depression/history conversion Disorder CPAP 5-20/ [employer]   Luna Download compliance- Body weight today- Covid vax-4 Phizer Flu vax-  ROS-see HPI   + = positive Constitutional:    weight loss, night sweats, fevers, chills, +fatigue, lassitude. HEENT:    headaches, difficulty swallowing, tooth/dental problems, sore throat,       sneezing, itching, ear ache, nasal congestion, post nasal drip, snoring CV:    chest pain, orthopnea, PND, swelling in lower extremities, anasarca,                                    dizziness, palpitations Resp:  + shortness of breath with exertion or at rest.                productive cough,   non-productive cough, coughing up of blood.              change in color of mucus.  wheezing.   Skin:    rash or lesions. GI:  No-   heartburn, indigestion, abdominal pain, nausea, vomiting, diarrhea,                 change in bowel habits, loss of appetite GU: dysuria, change in color of urine, no urgency or frequency.   flank pain. MS:   joint pain, stiffness, decreased range of motion, back pain. Neuro-     nothing unusual Psych:  change in mood or affect. + depression or anxiety.   memory loss.  OBJ- Physical Exam General- Alert, Oriented, Affect-appropriate, Distress- none acute, + obese Skin- rash-none, lesions- none, excoriation- none Lymphadenopathy- none Head- atraumatic            Eyes- Gross vision intact, PERRLA, conjunctivae and secretions clear            Ears- Hearing, canals-normal            Nose- Clear, no-Septal dev, mucus, polyps, erosion, perforation             Throat- Mallampati IV , mucosa clear , drainage- none, tonsils- atrophic, + teeth Neck- flexible , trachea midline, no stridor , thyroid nl, carotid no bruit Chest - symmetrical excursion , unlabored  Heart/CV- RRR , no murmur , no gallop  , no rub, nl s1 s2                           - JVD- none , edema- none, stasis changes- none, varices- none           Lung- clear to P&A, wheeze- none, cough- none , dullness-none, rub- none           Chest wall-  Abd-  Br/ Gen/ Rectal- Not done, not indicated Extrem- cyanosis- none, clubbing, none, atrophy- none, strength- nl Neuro- grossly intact to observation

## 2022-02-19 ENCOUNTER — Ambulatory Visit: Payer: Medicaid Other | Admitting: Internal Medicine

## 2022-02-19 DIAGNOSIS — G4733 Obstructive sleep apnea (adult) (pediatric): Secondary | ICD-10-CM | POA: Diagnosis not present

## 2022-02-26 DIAGNOSIS — F4310 Post-traumatic stress disorder, unspecified: Secondary | ICD-10-CM | POA: Diagnosis not present

## 2022-03-02 DIAGNOSIS — E1065 Type 1 diabetes mellitus with hyperglycemia: Secondary | ICD-10-CM | POA: Diagnosis not present

## 2022-03-13 DIAGNOSIS — E1065 Type 1 diabetes mellitus with hyperglycemia: Secondary | ICD-10-CM | POA: Diagnosis not present

## 2022-03-13 DIAGNOSIS — F4310 Post-traumatic stress disorder, unspecified: Secondary | ICD-10-CM | POA: Diagnosis not present

## 2022-03-16 ENCOUNTER — Ambulatory Visit (INDEPENDENT_AMBULATORY_CARE_PROVIDER_SITE_OTHER): Payer: Medicaid Other | Admitting: Family Medicine

## 2022-03-16 ENCOUNTER — Encounter: Payer: Self-pay | Admitting: Family Medicine

## 2022-03-16 VITALS — BP 133/63 | HR 93 | Ht 67.0 in | Wt 242.4 lb

## 2022-03-16 DIAGNOSIS — N939 Abnormal uterine and vaginal bleeding, unspecified: Secondary | ICD-10-CM | POA: Diagnosis not present

## 2022-03-16 HISTORY — DX: Abnormal uterine and vaginal bleeding, unspecified: N93.9

## 2022-03-16 NOTE — Assessment & Plan Note (Signed)
-  history of fibroids which is contributing but also likely contraception, she is seeking to have IUD removed and has discussed this with gynecology. I encouraged her to call their office to schedule a visit to have the IUD removal performed at her earliest convenience. May also benefit from fibroid treatment as well. -reassuringly not demonstrating signs of symptomatic anemia, consider CBC In the future if symptomatic and bleeding continues -instructed to follow up with gynecology at her earliest convenience which patient voiced understanding of this

## 2022-03-16 NOTE — Progress Notes (Signed)
    SUBJECTIVE:   CHIEF COMPLAINT / HPI:   Patient presents with irregular uterine bleeding for the past 1-2 months. She has an IUD in place and was wanting it out. Her PCP attempted to remove this but was unable to due to the strings breaking prior to this. She was referred to gynecologist who is working out scheduling a visit to remove this but has not gotten back to her in a few weeks. She was also told that she had fibroids and will need treatment for this. LMP was earlier this month. She has a normal, typical period for 5-7 days initially. Then started to get another cycle (2 in a month) that she did not see when she wiped but wore thin panty liners and noticed some spotting those times. LMP was earlier this month. Denies weakness, fatigue or dizziness. Denies noticing any clots in her pad during cycles.   OBJECTIVE:   BP 133/63   Pulse 93   Ht 5' 7"$  (1.702 m)   Wt 242 lb 6.4 oz (110 kg)   SpO2 100%   BMI 37.97 kg/m   General: Patient well-appearing, in no acute distress. CV: RRR, no murmurs or gallops auscultated Resp: CTAB, no wheezing, rales or rhonchi noted Ext: no LE edema noted bilaterally   ASSESSMENT/PLAN:   Abnormal uterine bleeding -history of fibroids which is contributing but also likely contraception, she is seeking to have IUD removed and has discussed this with gynecology. I encouraged her to call their office to schedule a visit to have the IUD removal performed at her earliest convenience. May also benefit from fibroid treatment as well. -reassuringly not demonstrating signs of symptomatic anemia, consider CBC In the future if symptomatic and bleeding continues -instructed to follow up with gynecology at her earliest convenience which patient voiced understanding of this      Donney Dice, [place]

## 2022-03-16 NOTE — Patient Instructions (Signed)
It was great seeing you today!  Please call the gynecologist to schedule a visit with Dr. Elgie Congo to remove the IUD, I believe your symptoms will improve once this is removed.   Please follow up at your next scheduled appointment, if anything arises between now and then, please don't hesitate to contact our office.   Thank you for allowing [place] to be a part of your medical care!  Thank you, Dr. Larae Grooms  Also a reminder of our clinic's no-show policy. Please make sure to arrive at least 15 minutes prior to your scheduled appointment time. Please try to cancel before 24 hours if you are not able to make it. If you no-show for 2 appointments then you will be receiving a warning letter. If you no-show after 3 visits, then you may be at risk of being dismissed from our clinic. This is to ensure that everyone is able to be seen in a timely manner. Thank you, we appreciate your assistance with this!

## 2022-03-20 ENCOUNTER — Telehealth: Payer: Self-pay

## 2022-03-20 DIAGNOSIS — G4733 Obstructive sleep apnea (adult) (pediatric): Secondary | ICD-10-CM | POA: Diagnosis not present

## 2022-03-20 NOTE — Telephone Encounter (Signed)
Schedule patient for physical on 03/22 '@830'$ 

## 2022-03-20 NOTE — Telephone Encounter (Signed)
-----   Message from [name], MD sent at 03/17/2022  7:39 AM EST ----- Regarding: Annual physical appointment Hello team,  Please contact patient and help her schedule annual physical with me. Thanks.  Dr. Johnette Abraham

## 2022-03-22 ENCOUNTER — Encounter: Payer: Self-pay | Admitting: Family Medicine

## 2022-03-22 NOTE — Progress Notes (Unsigned)
HPI female Smoker followed for OSA, complicated by HTN, Pleural Lipoma resected, Loculated R Pleural Effusion,  DM 1/neuropathy, DeQuervain's Tenosynovitis, obesity, depression/history conversion Disorder HST 10/15/20- AHI 26/ hr, desaturation to 78%, body weight 255 lbs  ==============================================================   02/20/21- 47 yo female Smoker followed for OSA, complicated by HTN, Pleural Lipoma resected, Loculated R Pleural Effusion, Tobacco use,  DM 1/neuropathy, DeQuervain's Tenosynovitis, obesity, depression/history conversion Disorder CPAP 5-20/ [employer]   Luna Download- Body weight today-236 lbs Covid vax-4 Phizer Flu vax-had -----Patient is doing good, no concerns She reports she has gotten used to CPAP and sleeps better with it than without it.  We have requested download from [employer].  She is using nasal pillows mask we discussed flexibility of mask options. She continues working with endocrinology on her diabetes and says control is better since she got a pump.  Weight loss remains a long-term goal. She has followed with thoracic surgery after resection of a lipoma. CXR 01/30/21- IMPRESSION: Persistent small loculated right pleural effusion and adjacent basilar atelectasis.  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  She says she comes home so tired she does falls right into bed without taking time to put CPAP on.  She also admits that she is somewhat afraid at night that she might stop breathing.  I discussed the safety and role of CPAP and trying to help  her with these issues.  Compliance goals were discussed.  She has lost 15 pounds since her original sleep study 2 years ago.  We are going to update home sleep test  ROS-see HPI   + = positive Constitutional:    weight loss, night sweats, fevers, chills, +fatigue, lassitude. HEENT:    headaches, difficulty swallowing, tooth/dental problems, sore throat,       sneezing, itching, ear ache, nasal congestion, post nasal drip, snoring CV:    chest pain, orthopnea, PND, swelling in lower extremities, anasarca,                                   dizziness, palpitations Resp:  + shortness of breath with exertion or at rest.                productive cough,   non-productive cough, coughing up of blood.              change in color of mucus.  wheezing.   Skin:    rash or lesions. GI:  No-   heartburn, indigestion, abdominal pain, nausea, vomiting, diarrhea,                 change in bowel habits, loss of appetite GU: dysuria, change in color of urine, no urgency or frequency.   flank pain. MS:   joint pain, stiffness, decreased range of motion, back pain. Neuro-     nothing unusual Psych:  change in mood or affect. + depression or anxiety.   memory loss.  OBJ- Physical Exam General- Alert, Oriented, Affect-appropriate, Distress- none acute, + obese, + looks tired Skin- rash-none, lesions- none, excoriation- none Lymphadenopathy- none  Head- atraumatic            Eyes- Gross vision intact, PERRLA, conjunctivae and secretions clear            Ears- Hearing, canals-normal            Nose- Clear, no-Septal dev, mucus, polyps, erosion, perforation             Throat- Mallampati IV , mucosa clear , drainage- none, tonsils- atrophic, + teeth Neck- flexible , trachea midline, no stridor , thyroid nl, carotid no bruit Chest - symmetrical excursion , unlabored           Heart/CV- RRR , no murmur , no gallop  , no rub, nl s1 s2                           - JVD- none , edema- none, stasis changes- none,  varices- none           Lung- clear to P&A, wheeze- none, cough- none , dullness-none, rub- none           Chest wall-  Abd-  Br/ Gen/ Rectal- Not done, not indicated Extrem- cyanosis- none, clubbing, none, atrophy- none, strength- nl Neuro- grossly intact to observation

## 2022-03-23 ENCOUNTER — Ambulatory Visit (INDEPENDENT_AMBULATORY_CARE_PROVIDER_SITE_OTHER): Payer: Medicaid Other | Admitting: Student

## 2022-03-23 VITALS — BP 114/64 | HR 87 | Ht 67.0 in | Wt 242.0 lb

## 2022-03-23 DIAGNOSIS — T22211A Burn of second degree of right forearm, initial encounter: Secondary | ICD-10-CM

## 2022-03-23 DIAGNOSIS — Z23 Encounter for immunization: Secondary | ICD-10-CM | POA: Diagnosis not present

## 2022-03-23 HISTORY — DX: Burn of second degree of right forearm, initial encounter: T22.211A

## 2022-03-23 NOTE — Progress Notes (Signed)
    SUBJECTIVE:   CHIEF COMPLAINT / HPI:   Burn  Burnt R forearm a little over a week ago when she hit the heat lamp while working at [employer]. Was applying neosporin and band aids, now applying gauze with tape. Was draining clear but stopped draining a couple days ago. Pain has improved but still throbbing, feeling the pain down into her hand. No fever. It did blister, that leaked and the skin peeled off.    OBJECTIVE:   BP 114/64   Pulse 87   Ht '5\' 7"'$  (1.702 m)   Wt 242 lb (109.8 kg)   SpO2 100%   BMI 37.90 kg/m    General: NAD, pleasant, able to participate in exam Cardiac: Well-perfused Respiratory: Breathing comfortably on room air Skin: Burn on right forearm, healing appropriately with mild surrounding edema but no erythema or warmth.  See photo Neuro: alert, no obvious focal deficits Psych: Normal affect and mood    ASSESSMENT/PLAN:   Second degree burn of right forearm The burn is healing very well with no sign of infection.  Patient is able to move her wrist and hand without pain. Pulses and sensation are present.  Advised she can continue using Neosporin if she would like and to bandage it when she goes to work and let it breathe when she goes home.     Dr. Precious Gilding, [place]

## 2022-03-23 NOTE — Patient Instructions (Signed)
It was great to see you! Thank you for allowing me to participate in your care!   Our plans for today:  - You burn is not infected. You have done a good job taking care of it.  - Continue applying neosporin if you would like and wear a bandage at work.    Take care and seek immediate care sooner if you develop any concerns.   Dr. Precious Gilding, DO [facility]

## 2022-03-23 NOTE — Assessment & Plan Note (Signed)
The burn is healing very well with no sign of infection.  Patient is able to move her wrist and hand without pain. Pulses and sensation are present.  Advised she can continue using Neosporin if she would like and to bandage it when she goes to work and let it breathe when she goes home.

## 2022-03-24 ENCOUNTER — Ambulatory Visit (INDEPENDENT_AMBULATORY_CARE_PROVIDER_SITE_OTHER): Payer: Medicaid Other | Admitting: Internal Medicine

## 2022-03-24 ENCOUNTER — Encounter: Payer: Self-pay | Admitting: Internal Medicine

## 2022-03-24 VITALS — BP 120/68 | HR 87 | Ht 67.0 in | Wt 240.5 lb

## 2022-03-24 DIAGNOSIS — G4733 Obstructive sleep apnea (adult) (pediatric): Secondary | ICD-10-CM

## 2022-03-24 DIAGNOSIS — E669 Obesity, unspecified: Secondary | ICD-10-CM

## 2022-03-24 NOTE — Patient Instructions (Signed)
Order- schedule home sleep test off CPAP    dx OSA  Your CPAP is very safe, and when you wear it, it is giving great protection from apneas.  Our goal is to wear it at least 4 hours per night, and hopefully all the time, any time you sleep.

## 2022-03-24 NOTE — Assessment & Plan Note (Signed)
Has benefited from weight loss.  Encouraged her to continue with diet/exercise.

## 2022-03-24 NOTE — Assessment & Plan Note (Signed)
From CPAP when used.  Discussed barriers to compliance and emphasized compliance goals and safety of CPAP. Plan-update HST since she has lost weight from original study.

## 2022-03-25 NOTE — Addendum Note (Signed)
Addended by: Meredith Staggers A on: 03/25/2022 11:46 AM   Modules accepted: Orders

## 2022-03-26 ENCOUNTER — Ambulatory Visit: Payer: Medicaid Other

## 2022-03-26 DIAGNOSIS — Z794 Long term (current) use of insulin: Secondary | ICD-10-CM | POA: Diagnosis not present

## 2022-03-26 DIAGNOSIS — E1042 Type 1 diabetes mellitus with diabetic polyneuropathy: Secondary | ICD-10-CM | POA: Diagnosis not present

## 2022-03-26 DIAGNOSIS — Z8639 Personal history of other endocrine, nutritional and metabolic disease: Secondary | ICD-10-CM | POA: Diagnosis not present

## 2022-03-26 DIAGNOSIS — E1065 Type 1 diabetes mellitus with hyperglycemia: Secondary | ICD-10-CM | POA: Diagnosis not present

## 2022-03-27 DIAGNOSIS — F4310 Post-traumatic stress disorder, unspecified: Secondary | ICD-10-CM | POA: Diagnosis not present

## 2022-04-01 DIAGNOSIS — E1065 Type 1 diabetes mellitus with hyperglycemia: Secondary | ICD-10-CM | POA: Diagnosis not present

## 2022-04-02 ENCOUNTER — Telehealth: Payer: Self-pay

## 2022-04-02 NOTE — Telephone Encounter (Signed)
Called patient to discuss potential surgery dates, patient opted for 04/23

## 2022-04-10 ENCOUNTER — Ambulatory Visit (INDEPENDENT_AMBULATORY_CARE_PROVIDER_SITE_OTHER): Payer: Medicaid Other | Admitting: Family Medicine

## 2022-04-10 ENCOUNTER — Encounter: Payer: Self-pay | Admitting: Family Medicine

## 2022-04-10 VITALS — BP 122/82 | HR 81 | Ht 67.0 in | Wt 241.4 lb

## 2022-04-10 DIAGNOSIS — M25562 Pain in left knee: Secondary | ICD-10-CM | POA: Diagnosis not present

## 2022-04-10 DIAGNOSIS — E1040 Type 1 diabetes mellitus with diabetic neuropathy, unspecified: Secondary | ICD-10-CM

## 2022-04-10 DIAGNOSIS — F339 Major depressive disorder, recurrent, unspecified: Secondary | ICD-10-CM

## 2022-04-10 DIAGNOSIS — Z1231 Encounter for screening mammogram for malignant neoplasm of breast: Secondary | ICD-10-CM

## 2022-04-10 DIAGNOSIS — Z0000 Encounter for general adult medical examination without abnormal findings: Secondary | ICD-10-CM

## 2022-04-10 DIAGNOSIS — T300 Burn of unspecified body region, unspecified degree: Secondary | ICD-10-CM

## 2022-04-10 MED ORDER — DICLOFENAC SODIUM 1 % EX GEL: CUTANEOUS | 2 refills | Status: AC

## 2022-04-10 NOTE — Assessment & Plan Note (Signed)
F/U endocrinology

## 2022-04-10 NOTE — Patient Instructions (Signed)

## 2022-04-10 NOTE — Assessment & Plan Note (Signed)
Stable. Good follow up with her therapist.

## 2022-04-10 NOTE — Assessment & Plan Note (Signed)
F/U with endocrinology

## 2022-04-10 NOTE — Progress Notes (Signed)
Subjective:     Barbara Clark is a 47 y.o. female and is here for a comprehensive physical exam. The patient reports problems - Left knee pain medially x 1.5 months. Denies any injury, however, she stands on her feet for hours at work. She took a long bus trip to [place] a few weeks prior. Pain is sharp pain in nature with ambulation, better while sitting.  She wears knee brace with ambulation and gym.   Also wanted to get her right forearm burn injury checked. She noticed a new blister around the healing scr a few days ago. She denies any known irritation of the area.  Counselor, doing well with her anxiety and depression.  DM: sees an endocrinologist. She declined A1C which was recently done by her endocrinologist. She declined provision of her retina eye exam, only want to disclose to her endocrinologist.   Social History   Socioeconomic History   Marital status: Single    Spouse name: Not on file   Number of children: 3   Years of education: Not on file   Highest education level: Not on file  Occupational History   Not on file  Tobacco Use   Smoking status: Former    Packs/day: 1.00    Years: 12.00    Additional pack years: 0.00    Total pack years: 12.00    Types: Cigarettes    Quit date: 07/14/2017    Years since quitting: 4.7   Smokeless tobacco: Never  Vaping Use   Vaping Use: Never used  Substance and Sexual Activity   Alcohol use: Not Currently    Comment: rare but on occasion   Drug use: No   Sexual activity: Yes    Birth control/protection: I.U.D., Condom, Surgical    Comment: BTL in 2004  Other Topics Concern   Not on file  Social History Narrative   Pt lives with her two children. Has some college.    Social Determinants of Health   Financial Resource Strain: Not on file  Food Insecurity: Food Insecurity Present (01/07/2022)   Hunger Vital Sign    Worried About Running Out of Food in the Last Year: Never true    Ran Out of Food in the Last Year:  Sometimes true  Transportation Needs: No Transportation Needs (01/07/2022)   PRAPARE - Hydrologist (Medical): No    Lack of Transportation (Non-Medical): No  Physical Activity: Not on file  Stress: Not on file  Social Connections: Not on file  Intimate Partner Violence: Not on file   Health Maintenance  Topic Date Due   Diabetic kidney evaluation - Urine ACR  09/19/2016   OPHTHALMOLOGY EXAM  09/12/2020   COVID-19 Vaccine (5 - 2023-24 season) 09/19/2021   Diabetic kidney evaluation - eGFR measurement  05/30/2022   FOOT EXAM  11/20/2022   PAP SMEAR-Modifier  08/13/2026   DTaP/Tdap/Td (3 - Td or Tdap) 02/15/2030   INFLUENZA VACCINE  Completed   Hepatitis C Screening  Completed   HIV Screening  Completed   HPV VACCINES  Aged Out    The following portions of the patient's history were reviewed and updated as appropriate: allergies, current medications, past family history, past medical history, past social history, past surgical history, and problem list.  Review of Systems Pertinent items are noted in HPI. Pertinent items noted in HPI and remainder of comprehensive ROS otherwise negative.   Objective:    BP 122/82   Pulse 81  Ht 5\' 7"  (1.702 m)   Wt 241 lb 6 oz (109.5 kg)   SpO2 99%   BMI 37.80 kg/m  General appearance: alert and cooperative Head: Normocephalic, without obvious abnormality, atraumatic Eyes: conjunctivae/corneas clear. PERRL, EOM's intact. Fundi benign. Ears: normal TM's and external ear canals both ears Throat: lips, mucosa, and tongue normal; teeth and gums normal Neck: no adenopathy, no carotid bruit, no JVD, supple, symmetrical, trachea midline, and thyroid not enlarged, symmetric, no tenderness/mass/nodules Lungs: clear to auscultation bilaterally Heart: regular rate and rhythm, S1, S2 normal, no murmur, click, rub or gallop Abdomen: soft, non-tender; bowel sounds normal; no masses,  no organomegaly Extremities:  extremities normal, atraumatic, no cyanosis or edema Skin: Burnt scar with a small area of single reddish blister on her right forearm dorsally Lymph nodes: Cervical, supraclavicular, and axillary nodes normal. Neurologic: Alert and oriented X 3, normal strength and tone. Normal symmetric reflexes. Normal coordination and gait    Assessment:    Healthy female exam.     Left knee pain Burns injury Plan:  Normal appearing. Up to date with her DM maintenance with her endocrinologist. Vaccines reviewed. Up to date with PAP and mammogram is due in June. Mammogram ordered. She will contact breast center to schedule her appointment.  Burns injury - no signs of infection. Delayed healing in the settings of DM discussed. Triple A/B ointment with soft white gauze dressing completed today. Monitor closely for now.  Left knee pain: No swelling, erythema or signs of effusion. Likely ligament inflammation from overuse vs. OA. Conservative management for now. Voltaren gel prn escribed. Consider knee xray in the future. She agreed with the plan.     See After Visit Summary for Counseling Recommendations

## 2022-04-11 LAB — BASIC METABOLIC PANEL
BUN/Creatinine Ratio: 13 (ref 9–23)
BUN: 9 mg/dL (ref 6–24)
CO2: 21 mmol/L (ref 20–29)
Calcium: 9 mg/dL (ref 8.7–10.2)
Chloride: 105 mmol/L (ref 96–106)
Creatinine, Ser: 0.7 mg/dL (ref 0.57–1.00)
Glucose: 118 mg/dL — ABNORMAL HIGH (ref 70–99)
Potassium: 4.6 mmol/L (ref 3.5–5.2)
Sodium: 141 mmol/L (ref 134–144)
eGFR: 109 mL/min/{1.73_m2} (ref >59)

## 2022-04-11 LAB — LIPID PANEL
Chol/HDL Ratio: 2.6 ratio (ref 0.0–4.4)
Cholesterol, Total: 98 mg/dL — ABNORMAL LOW (ref 100–199)
HDL: 37 mg/dL — ABNORMAL LOW (ref >39)
LDL Chol Calc (NIH): 51 mg/dL (ref 0–99)
Triglycerides: 34 mg/dL (ref 0–149)
VLDL Cholesterol Cal: 10 mg/dL (ref 5–40)

## 2022-04-13 ENCOUNTER — Telehealth: Payer: Self-pay | Admitting: Family Medicine

## 2022-04-13 ENCOUNTER — Other Ambulatory Visit: Payer: Self-pay | Admitting: Family Medicine

## 2022-04-13 DIAGNOSIS — E1065 Type 1 diabetes mellitus with hyperglycemia: Secondary | ICD-10-CM | POA: Diagnosis not present

## 2022-04-13 DIAGNOSIS — E1069 Type 1 diabetes mellitus with other specified complication: Secondary | ICD-10-CM

## 2022-04-13 NOTE — Telephone Encounter (Signed)
Lab result discussed. LDL at goal. I reminded her to schedule mammogram appointment for June. She mentioned that she did not give her urine for Microalbumin test the last time and will like to come in for that.  Appointment made for tomorrow for UACR. Future order placed.

## 2022-04-14 ENCOUNTER — Other Ambulatory Visit: Payer: Self-pay

## 2022-04-17 DIAGNOSIS — F4310 Post-traumatic stress disorder, unspecified: Secondary | ICD-10-CM | POA: Diagnosis not present

## 2022-05-01 ENCOUNTER — Encounter (HOSPITAL_BASED_OUTPATIENT_CLINIC_OR_DEPARTMENT_OTHER): Payer: Self-pay | Admitting: Obstetrics and Gynecology

## 2022-05-01 ENCOUNTER — Other Ambulatory Visit: Payer: Self-pay

## 2022-05-01 DIAGNOSIS — E1065 Type 1 diabetes mellitus with hyperglycemia: Secondary | ICD-10-CM | POA: Diagnosis not present

## 2022-05-01 NOTE — Progress Notes (Signed)
Spoke w/ via phone for pre-op interview---pt Lab needs dos----     EKG, I stat, urine preg          Lab results------none COVID test -----patient states asymptomatic no test needed Arrive at -------1100 am 05-12-2022 NPO after MN NO Solid Food.  Clear liquids from MN until---1000 am Med rec completed Medications to take morning of surgery -----duloxetine Diabetic medication -----insulin pump run at basal rate Patient instructed no nail polish to be worn day of surgery Patient instructed to bring photo id and insurance card day of surgery Patient aware to have Driver (ride ) / caregiver  mother or daughter will arrange   for 24 hours after surgery  Patient Special Instructions -----bring insulin pump supplies dos Pre-Op special Istructions -----bring cpap mask tubing and machine and leave in car Patient verbalized understanding of instructions that were given at this phone interview. Patient denies shortness of breath, chest pain, fever, cough at this phone interview.

## 2022-05-06 ENCOUNTER — Ambulatory Visit (INDEPENDENT_AMBULATORY_CARE_PROVIDER_SITE_OTHER): Payer: Medicaid Other | Admitting: Obstetrics and Gynecology

## 2022-05-06 ENCOUNTER — Encounter: Payer: Self-pay | Admitting: Obstetrics and Gynecology

## 2022-05-06 ENCOUNTER — Other Ambulatory Visit: Payer: Self-pay

## 2022-05-06 VITALS — BP 108/66 | HR 85 | Ht 67.0 in | Wt 239.9 lb

## 2022-05-06 DIAGNOSIS — Z01818 Encounter for other preprocedural examination: Secondary | ICD-10-CM | POA: Diagnosis not present

## 2022-05-06 MED ORDER — MISOPROSTOL 100 MCG PO TABS
ORAL_TABLET | ORAL | 0 refills | Status: DC
Start: 2022-05-06 — End: 2022-05-12

## 2022-05-06 NOTE — Progress Notes (Signed)
OB/GYN Pre-Op History and Physical  Barbara Clark is a 46 y.o. G3P3003 presenting for preoperative visit.  Pt is scheduled for Sonata fibroid ablation.  Risks and benefits of the procedure given including bleeding, infection, involvement of other organs as well as uterine perforation. Pt is a type 1 diabetic with an insulin pump.  Pt advised to check with her endocrinologist regarding lowering basal rate on the pump and having clear liquids (apple juice) as needed to maintain blood sugar.      Past Medical History:  Diagnosis Date   Anxiety    Chronic leg pain 04/27/2014   left knee   Depression    Diabetes mellitus Type 1    Diabetic neuropathy    hands and feet   DKA (diabetic ketoacidosis) 09/03/2017   HSV-2 (herpes simplex virus 2) infection 2012   serology   Menorrhagia 04/27/2014   PONV (postoperative nausea and vomiting)    Sleep apnea 10/15/2020   Wears glasses     Past Surgical History:  Procedure Laterality Date   DENTAL RESTORATION/EXTRACTION WITH X-RAY Left 11/06/2020   EXAM UNDER ANESTHESIA WITH MANIPULATION OF SHOULDER  03/2020   EYE SURGERY Bilateral 2021   cataracts   INTERCOSTAL NERVE BLOCK Right 11/28/2020   Procedure: INTERCOSTAL NERVE BLOCK;  Surgeon: Hendrickson, Steven C, MD;  Location: [facility] OR;  Service: Thoracic;  Laterality: Right;   TUBAL LIGATION  2004    OB History  Gravida Para Term Preterm AB Living  3 3 3     3  SAB IAB Ectopic Multiple Live Births               # Outcome Date GA Lbr Len/2nd Weight Sex Delivery Anes PTL Lv  3 Term      Vag-Spont     2 Term      Vag-Spont     1 Term      Vag-Spont       Social History   Socioeconomic History   Marital status: Single    Spouse name: Not on file   Number of children: 3   Years of education: Not on file   Highest education level: Not on file  Occupational History   Not on file  Tobacco Use   Smoking status: Former    Packs/day: 1.00    Years: 12.00    Additional pack years:  0.00    Total pack years: 12.00    Types: Cigarettes    Quit date: 06/13/2017    Years since quitting: 4.8   Smokeless tobacco: Never  Vaping Use   Vaping Use: Never used  Substance and Sexual Activity   Alcohol use: Not Currently   Drug use: No   Sexual activity: Yes    Birth control/protection: I.U.D., Condom, Surgical    Comment: BTL in 2004  Other Topics Concern   Not on file  Social History Narrative   Pt lives with her two children. Has some college.    Social Determinants of Health   Financial Resource Strain: Not on file  Food Insecurity: Food Insecurity Present (01/07/2022)   Hunger Vital Sign    Worried About Running Out of Food in the Last Year: Never true    Ran Out of Food in the Last Year: Sometimes true  Transportation Needs: No Transportation Needs (01/07/2022)   PRAPARE - Transportation    Lack of Transportation (Medical): No    Lack of Transportation (Non-Medical): No  Physical Activity: Not on file  Stress: Not   on file  Social Connections: Not on file    Family History  Problem Relation Age of Onset   Diabetes Mother    Hypertension Mother    Diabetes Maternal Grandmother     (Not in a hospital admission)   No Known Allergies  Review of Systems: Negative except for what is mentioned in HPI.     Physical Exam: BP 108/66   Pulse 85   Ht  (1.702 m)   Wt 239 lb 14.4 oz (108.8 kg)   LMP 03/20/2022 (Approximate)   BMI 37.57 kg/m  CONSTITUTIONAL: Well-developed, well-nourished female in no acute distress.  HENT:  Normocephalic, atraumatic, External right and left ear normal. Oropharynx is clear and moist EYES: Conjunctivae and EOM are normal. Pupils are equal, round, and reactive to light. No scleral icterus.  NECK: Normal range of motion, supple, no masses SKIN: Skin is warm and dry. No rash noted. Not diaphoretic. No erythema. No pallor. NEUROLGIC: Alert and oriented to person, place, and time. Normal reflexes, muscle tone  coordination. No cranial nerve deficit noted. PSYCHIATRIC: Normal mood and affect. Normal behavior. Normal judgment and thought content. CARDIOVASCULAR: Normal heart rate noted, regular rhythm RESPIRATORY: Effort and breath sounds normal, no problems with respiration noted ABDOMEN: Soft, nontender, nondistended,  PELVIC: Deferred MUSCULOSKELETAL: Normal range of motion. No edema and no tenderness. 2+ distal pulses.   Pertinent Labs/Studies:   CLINICAL DATA:  Missing IUD strings, LMP 01/06/2022, G3P3   EXAM: TRANSABDOMINAL AND TRANSVAGINAL ULTRASOUND OF PELVIS   TECHNIQUE: Both transabdominal and transvaginal ultrasound examinations of the pelvis were performed. Transabdominal technique was performed for global imaging of the pelvis including uterus, ovaries, adnexal regions, and pelvic cul-de-sac. It was necessary to proceed with endovaginal exam following the transabdominal exam to visualize the IUD and endometrium. Degradation of image quality secondary to bowel and body habitus.   COMPARISON:  10/11/2019   FINDINGS: Uterus   Measurements: 16.0 x 7.8 x 10.9 cm = volume: 650 mL. Anteverted. Heterogeneous myometrium. Fundal intramural leiomyoma 6.5 x 6.5 x 6.3 cm. Additional leiomyomata 5.4 cm posteriorly and 4.3 cm anteriorly. Nabothian cysts in cervix.   Endometrium   Thickness: 9 mm. No endometrial fluid or mass. IUD identified within endometrial canal at the mid upper uterus, though position of the limbs is poorly established, uncertain if extend to fundal aspect of endometrial complex.   Right ovary   Measurements: 3.1 x 2.3 x 3.2 cm = volume: 11.6 mL. Limited visualization on transabdominal imaging due to bowel, nonvisualization on trans vaginal imaging. No gross mass.   Left ovary   Measurements: 2.6 x 1.6 x 2.6 cm = volume: 5.7 mL. Normal morphology without gross mass   Other findings   No free pelvic fluid or adnexal masses.   IMPRESSION: IUD  identified within endometrial canal at mid upper portions, uncertain if extends to the fundal extent of the endometrial complex.   Three uterine leiomyomata as above.      Assessment and Plan :Barbara Clark is a 47 y.o. G3P3003 here for preop visit for Sonata fibroid ablation and hysteroscopic removal of IUD.   Plan for hysteroscopic IUD retrieval and Sonata ablation NPO, clear liquids if needed to maintain blood sugar Admission labs ordered VS Q4 Misoprostol cervical ripening prior to procedure.   Mariel Aloe, M.D. Attending Obstetrician & Gynecologist, [facility] for [employer], [facility]

## 2022-05-06 NOTE — H&P (View-Only) (Signed)
OB/GYN Pre-Op History and Physical  Barbara Clark is a 47 y.o. [id number] presenting for preoperative visit.  Pt is scheduled for Sonata fibroid ablation.  Risks and benefits of the procedure given including bleeding, infection, involvement of other organs as well as uterine perforation. Pt is a type 1 diabetic with an insulin pump.  Pt advised to check with her endocrinologist regarding lowering basal rate on the pump and having clear liquids (apple juice) as needed to maintain blood sugar.      Past Medical History:  Diagnosis Date   Anxiety    Chronic leg pain 04/27/2014   left knee   Depression    Diabetes mellitus Type 1    Diabetic neuropathy    hands and feet   DKA (diabetic ketoacidosis) 09/03/2017   HSV-2 (herpes simplex virus 2) infection 2012   serology   Menorrhagia 04/27/2014   PONV (postoperative nausea and vomiting)    Sleep apnea 10/15/2020   Wears glasses     Past Surgical History:  Procedure Laterality Date   DENTAL RESTORATION/EXTRACTION WITH X-RAY Left 11/06/2020   EXAM UNDER ANESTHESIA WITH MANIPULATION OF SHOULDER  03/2020   EYE SURGERY Bilateral 2021   cataracts   INTERCOSTAL NERVE BLOCK Right 11/28/2020   Procedure: INTERCOSTAL NERVE BLOCK;  Surgeon: Loreli Slot, MD;  Location: [facility] OR;  Service: Thoracic;  Laterality: Right;   TUBAL LIGATION  2004    OB History  Gravida Para Term Preterm AB Living  SAB IAB Ectopic Multiple Live Births               # Outcome Date GA Lbr Len/2nd Weight Sex Delivery Anes PTL Lv  3 Term      Vag-Spont     2 Term      Vag-Spont     1 Term      Vag-Spont       Social History   Socioeconomic History   Marital status: Single    Spouse name: Not on file   Number of children: 3   Years of education: Not on file   Highest education level: Not on file  Occupational History   Not on file  Tobacco Use   Smoking status: Former    Packs/day: 1.00    Years: 12.00    Additional pack years:  0.00    Total pack years: 12.00    Types: Cigarettes    Quit date: 06/13/2017    Years since quitting: 4.8   Smokeless tobacco: Never  Vaping Use   Vaping Use: Never used  Substance and Sexual Activity   Alcohol use: Not Currently   Drug use: No   Sexual activity: Yes    Birth control/protection: I.U.D., Condom, Surgical    Comment: BTL in 2004  Other Topics Concern   Not on file  Social History Narrative   Pt lives with her two children. Has some college.    Social Determinants of Health   Financial Resource Strain: Not on file  Food Insecurity: Food Insecurity Present (01/07/2022)   Hunger Vital Sign    Worried About Running Out of Food in the Last Year: Never true    Ran Out of Food in the Last Year: Sometimes true  Transportation Needs: No Transportation Needs (01/07/2022)   PRAPARE - Administrator, Civil Service (Medical): No    Lack of Transportation (Non-Medical): No  Physical Activity: Not on file  Stress: Not  on file  Social Connections: Not on file    Family History  Problem Relation Age of Onset   Diabetes Mother    Hypertension Mother    Diabetes Maternal Grandmother     (Not in a hospital admission)   No Known Allergies  Review of Systems: Negative except for what is mentioned in HPI.     Physical Exam: BP 108/66   Pulse 85   Ht  (1.702 m)   Wt 239 lb 14.4 oz (108.8 kg)   LMP 03/20/2022 (Approximate)   BMI 37.57 kg/m  CONSTITUTIONAL: Well-developed, well-nourished female in no acute distress.  HENT:  Normocephalic, atraumatic, External right and left ear normal. Oropharynx is clear and moist EYES: Conjunctivae and EOM are normal. Pupils are equal, round, and reactive to light. No scleral icterus.  NECK: Normal range of motion, supple, no masses SKIN: Skin is warm and dry. No rash noted. Not diaphoretic. No erythema. No pallor. NEUROLGIC: Alert and oriented to person, place, and time. Normal reflexes, muscle tone  coordination. No cranial nerve deficit noted. PSYCHIATRIC: Normal mood and affect. Normal behavior. Normal judgment and thought content. CARDIOVASCULAR: Normal heart rate noted, regular rhythm RESPIRATORY: Effort and breath sounds normal, no problems with respiration noted ABDOMEN: Soft, nontender, nondistended,  PELVIC: Deferred MUSCULOSKELETAL: Normal range of motion. No edema and no tenderness. 2+ distal pulses.   Pertinent Labs/Studies:   CLINICAL DATA:  Missing IUD strings, LMP 01/06/2022, G3P3   EXAM: TRANSABDOMINAL AND TRANSVAGINAL ULTRASOUND OF PELVIS   TECHNIQUE: Both transabdominal and transvaginal ultrasound examinations of the pelvis were performed. Transabdominal technique was performed for global imaging of the pelvis including uterus, ovaries, adnexal regions, and pelvic cul-de-sac. It was necessary to proceed with endovaginal exam following the transabdominal exam to visualize the IUD and endometrium. Degradation of image quality secondary to bowel and body habitus.   COMPARISON:  10/11/2019   FINDINGS: Uterus   Measurements: 16.0 x 7.8 x 10.9 cm = volume: 650 mL. Anteverted. Heterogeneous myometrium. Fundal intramural leiomyoma 6.5 x 6.5 x 6.3 cm. Additional leiomyomata 5.4 cm posteriorly and 4.3 cm anteriorly. Nabothian cysts in cervix.   Endometrium   Thickness: 9 mm. No endometrial fluid or mass. IUD identified within endometrial canal at the mid upper uterus, though position of the limbs is poorly established, uncertain if extend to fundal aspect of endometrial complex.   Right ovary   Measurements: 3.1 x 2.3 x 3.2 cm = volume: 11.6 mL. Limited visualization on transabdominal imaging due to bowel, nonvisualization on trans vaginal imaging. No gross mass.   Left ovary   Measurements: 2.6 x 1.6 x 2.6 cm = volume: 5.7 mL. Normal morphology without gross mass   Other findings   No free pelvic fluid or adnexal masses.   IMPRESSION: IUD  identified within endometrial canal at mid upper portions, uncertain if extends to the fundal extent of the endometrial complex.   Three uterine leiomyomata as above.      Assessment and Plan :Barbara Clark is a 47 y.o. G3P3003 here for preop visit for Sonata fibroid ablation and hysteroscopic removal of IUD.   Plan for hysteroscopic IUD retrieval and Sonata ablation NPO, clear liquids if needed to maintain blood sugar Admission labs ordered VS Q4 Misoprostol cervical ripening prior to procedure.   Mariel Aloe, M.D. Attending Obstetrician & Gynecologist, [facility] for [employer], [facility]

## 2022-05-08 DIAGNOSIS — G4733 Obstructive sleep apnea (adult) (pediatric): Secondary | ICD-10-CM | POA: Diagnosis not present

## 2022-05-12 ENCOUNTER — Encounter (HOSPITAL_BASED_OUTPATIENT_CLINIC_OR_DEPARTMENT_OTHER): Payer: Self-pay | Admitting: Obstetrics and Gynecology

## 2022-05-12 ENCOUNTER — Ambulatory Visit (HOSPITAL_BASED_OUTPATIENT_CLINIC_OR_DEPARTMENT_OTHER): Payer: Medicaid Other | Admitting: Anesthesiology

## 2022-05-12 ENCOUNTER — Other Ambulatory Visit: Payer: Self-pay

## 2022-05-12 ENCOUNTER — Encounter (HOSPITAL_BASED_OUTPATIENT_CLINIC_OR_DEPARTMENT_OTHER)
Admission: RE | Disposition: A | Discharge status: Home / Self Care | Payer: Self-pay | Source: Home / Self Care | Attending: Obstetrics and Gynecology

## 2022-05-12 ENCOUNTER — Ambulatory Visit (HOSPITAL_COMMUNITY)
Admission: RE | Admit: 2022-05-12 | Discharge: 2022-05-12 | Disposition: A | Discharge status: Home / Self Care | Payer: Medicaid Other | Attending: Obstetrics and Gynecology | Admitting: Obstetrics and Gynecology

## 2022-05-12 DIAGNOSIS — Z794 Long term (current) use of insulin: Secondary | ICD-10-CM | POA: Insufficient documentation

## 2022-05-12 DIAGNOSIS — T8332XA Displacement of intrauterine contraceptive device, initial encounter: Secondary | ICD-10-CM | POA: Diagnosis not present

## 2022-05-12 DIAGNOSIS — E119 Type 2 diabetes mellitus without complications: Secondary | ICD-10-CM

## 2022-05-12 DIAGNOSIS — F419 Anxiety disorder, unspecified: Secondary | ICD-10-CM

## 2022-05-12 DIAGNOSIS — T8332XD Displacement of intrauterine contraceptive device, subsequent encounter: Secondary | ICD-10-CM

## 2022-05-12 DIAGNOSIS — E109 Type 1 diabetes mellitus without complications: Secondary | ICD-10-CM | POA: Insufficient documentation

## 2022-05-12 DIAGNOSIS — D259 Leiomyoma of uterus, unspecified: Secondary | ICD-10-CM

## 2022-05-12 DIAGNOSIS — Z87891 Personal history of nicotine dependence: Secondary | ICD-10-CM | POA: Insufficient documentation

## 2022-05-12 DIAGNOSIS — Y762 Prosthetic and other implants, materials and accessory obstetric and gynecological devices associated with adverse incidents: Secondary | ICD-10-CM | POA: Insufficient documentation

## 2022-05-12 DIAGNOSIS — D251 Intramural leiomyoma of uterus: Secondary | ICD-10-CM

## 2022-05-12 DIAGNOSIS — G4730 Sleep apnea, unspecified: Secondary | ICD-10-CM | POA: Diagnosis not present

## 2022-05-12 DIAGNOSIS — D252 Subserosal leiomyoma of uterus: Secondary | ICD-10-CM

## 2022-05-12 DIAGNOSIS — N939 Abnormal uterine and vaginal bleeding, unspecified: Secondary | ICD-10-CM

## 2022-05-12 DIAGNOSIS — Z9641 Presence of insulin pump (external) (internal): Secondary | ICD-10-CM | POA: Insufficient documentation

## 2022-05-12 DIAGNOSIS — Z01818 Encounter for other preprocedural examination: Secondary | ICD-10-CM

## 2022-05-12 HISTORY — DX: Presence of spectacles and contact lenses: Z97.3

## 2022-05-12 HISTORY — PX: HYSTEROSCOPY WITH D & C: SHX1775

## 2022-05-12 HISTORY — DX: Type 2 diabetes mellitus with diabetic neuropathy, unspecified: E11.40

## 2022-05-12 LAB — POCT I-STAT, CHEM 8
BUN: 7 mg/dL (ref 6–20)
Calcium, Ion: 1.17 mmol/L (ref 1.15–1.40)
Chloride: 99 mmol/L (ref 98–111)
Creatinine, Ser: 0.6 mg/dL (ref 0.44–1.00)
Glucose, Bld: 217 mg/dL — ABNORMAL HIGH (ref 70–99)
HCT: 40 % (ref 36.0–46.0)
Hemoglobin: 13.6 g/dL (ref 12.0–15.0)
Potassium: 3.9 mmol/L (ref 3.5–5.1)
Sodium: 137 mmol/L (ref 135–145)
TCO2: 24 mmol/L (ref 22–32)

## 2022-05-12 LAB — CBC
HCT: 37.6 % (ref 36.0–46.0)
Hemoglobin: 12.3 g/dL (ref 12.0–15.0)
MCH: 30.5 pg (ref 26.0–34.0)
MCHC: 32.7 g/dL (ref 30.0–36.0)
MCV: 93.3 fL (ref 80.0–100.0)
Platelets: 410 10*3/uL — ABNORMAL HIGH (ref 150–400)
RBC: 4.03 MIL/uL (ref 3.87–5.11)
RDW: 12 % (ref 11.5–15.5)
WBC: 9 10*3/uL (ref 4.0–10.5)
nRBC: 0 % (ref 0.0–0.2)

## 2022-05-12 LAB — POCT PREGNANCY, URINE: Preg Test, Ur: NEGATIVE

## 2022-05-12 LAB — TYPE AND SCREEN
ABO/RH(D): B POS
Antibody Screen: NEGATIVE

## 2022-05-12 LAB — GLUCOSE, CAPILLARY
Glucose-Capillary: 173 mg/dL — ABNORMAL HIGH (ref 70–99)
Glucose-Capillary: 175 mg/dL — ABNORMAL HIGH (ref 70–99)

## 2022-05-12 SURGERY — RADIOFREQUENCY ABLATION, LEIOMYOMA, UTERUS, TRANSCERVICAL APPROACH, WITH US GUIDANCE
Anesthesia: General | Site: Vagina

## 2022-05-12 MED ORDER — MIDAZOLAM HCL 2 MG/2ML IJ SOLN
INTRAMUSCULAR | Status: AC
  Filled 2022-05-12: qty 2

## 2022-05-12 MED ORDER — CEFAZOLIN SODIUM-DEXTROSE 2-4 GM/100ML-% IV SOLN
INTRAVENOUS | Status: AC
  Filled 2022-05-12: qty 100

## 2022-05-12 MED ORDER — POVIDONE-IODINE 10 % EX SWAB: 2.0000 | Freq: Once | CUTANEOUS | Status: DC

## 2022-05-12 MED ORDER — PROMETHAZINE HCL 25 MG/ML IJ SOLN: 6.2500 mg | INTRAMUSCULAR | Status: DC | PRN

## 2022-05-12 MED ORDER — PHENYLEPHRINE 80 MCG/ML (10ML) SYRINGE FOR IV PUSH (FOR BLOOD PRESSURE SUPPORT)
PREFILLED_SYRINGE | INTRAVENOUS | Status: DC | PRN
  Administered 2022-05-12: 80 ug via INTRAVENOUS
  Administered 2022-05-12 (×2): 240 ug via INTRAVENOUS
  Administered 2022-05-12: 160 ug via INTRAVENOUS

## 2022-05-12 MED ORDER — GLYCOPYRROLATE PF 0.2 MG/ML IJ SOSY
PREFILLED_SYRINGE | INTRAMUSCULAR | Status: AC
  Filled 2022-05-12: qty 1

## 2022-05-12 MED ORDER — PROPOFOL 10 MG/ML IV BOLUS
INTRAVENOUS | Status: DC | PRN
  Administered 2022-05-12: 180 mg via INTRAVENOUS

## 2022-05-12 MED ORDER — PHENYLEPHRINE HCL (PRESSORS) 10 MG/ML IV SOLN
INTRAVENOUS | Status: AC
  Filled 2022-05-12: qty 1

## 2022-05-12 MED ORDER — KETOROLAC TROMETHAMINE 30 MG/ML IJ SOLN
INTRAMUSCULAR | Status: DC | PRN
  Administered 2022-05-12: 30 mg via INTRAVENOUS

## 2022-05-12 MED ORDER — KETOROLAC TROMETHAMINE 30 MG/ML IJ SOLN
INTRAMUSCULAR | Status: AC
  Filled 2022-05-12: qty 2

## 2022-05-12 MED ORDER — AMISULPRIDE (ANTIEMETIC) 5 MG/2ML IV SOLN
INTRAVENOUS | Status: AC
  Filled 2022-05-12: qty 4

## 2022-05-12 MED ORDER — LIDOCAINE 2% (20 MG/ML) 5 ML SYRINGE
INTRAMUSCULAR | Status: DC | PRN
  Administered 2022-05-12: 100 mg via INTRAVENOUS

## 2022-05-12 MED ORDER — LACTATED RINGERS IV SOLN: INTRAVENOUS | Status: DC

## 2022-05-12 MED ORDER — LIDOCAINE HCL 1 % IJ SOLN
INTRAMUSCULAR | Status: DC | PRN
  Administered 2022-05-12: 6 mL

## 2022-05-12 MED ORDER — DEXAMETHASONE SODIUM PHOSPHATE 10 MG/ML IJ SOLN
INTRAMUSCULAR | Status: DC | PRN
  Administered 2022-05-12: 4 mg via INTRAVENOUS

## 2022-05-12 MED ORDER — FENTANYL CITRATE (PF) 100 MCG/2ML IJ SOLN: 25.0000 ug | INTRAMUSCULAR | Status: DC | PRN

## 2022-05-12 MED ORDER — PROPOFOL 10 MG/ML IV BOLUS
INTRAVENOUS | Status: AC
  Filled 2022-05-12: qty 20

## 2022-05-12 MED ORDER — FENTANYL CITRATE (PF) 100 MCG/2ML IJ SOLN
INTRAMUSCULAR | Status: DC | PRN
  Administered 2022-05-12: 25 ug via INTRAVENOUS
  Administered 2022-05-12: 100 ug via INTRAVENOUS

## 2022-05-12 MED ORDER — STERILE WATER FOR IRRIGATION IR SOLN
Status: DC | PRN
  Administered 2022-05-12: 500 mL

## 2022-05-12 MED ORDER — ACETAMINOPHEN 500 MG PO TABS
ORAL_TABLET | ORAL | Status: AC
  Filled 2022-05-12: qty 2

## 2022-05-12 MED ORDER — SOD CITRATE-CITRIC ACID 500-334 MG/5ML PO SOLN: 30.0000 mL | ORAL | Status: DC

## 2022-05-12 MED ORDER — SCOPOLAMINE 1 MG/3DAYS TD PT72
1.0000 | MEDICATED_PATCH | TRANSDERMAL | Status: DC
  Administered 2022-05-12: 1.5 mg via TRANSDERMAL

## 2022-05-12 MED ORDER — PHENYLEPHRINE HCL-NACL 20-0.9 MG/250ML-% IV SOLN
INTRAVENOUS | Status: DC | PRN
  Administered 2022-05-12: 40 ug/min via INTRAVENOUS

## 2022-05-12 MED ORDER — SODIUM CHLORIDE 0.9 % IR SOLN
Status: DC | PRN
  Administered 2022-05-12: 1500 mL

## 2022-05-12 MED ORDER — OXYCODONE-ACETAMINOPHEN 5-325 MG PO TABS: 1.0000 | ORAL_TABLET | Freq: Four times a day (QID) | ORAL | 0 refills | Status: DC | PRN

## 2022-05-12 MED ORDER — ACETAMINOPHEN 500 MG PO TABS
1000.0000 mg | ORAL_TABLET | ORAL | Status: AC
  Administered 2022-05-12: 1000 mg via ORAL

## 2022-05-12 MED ORDER — PHENYLEPHRINE 80 MCG/ML (10ML) SYRINGE FOR IV PUSH (FOR BLOOD PRESSURE SUPPORT)
PREFILLED_SYRINGE | INTRAVENOUS | Status: AC
  Filled 2022-05-12: qty 10

## 2022-05-12 MED ORDER — ONDANSETRON HCL 4 MG/2ML IJ SOLN
INTRAMUSCULAR | Status: DC | PRN
  Administered 2022-05-12: 4 mg via INTRAVENOUS

## 2022-05-12 MED ORDER — ACETAMINOPHEN 500 MG PO TABS: 1000.0000 mg | ORAL_TABLET | Freq: Once | ORAL | Status: DC

## 2022-05-12 MED ORDER — AMISULPRIDE (ANTIEMETIC) 5 MG/2ML IV SOLN
10.0000 mg | Freq: Once | INTRAVENOUS | Status: AC | PRN
  Administered 2022-05-12: 10 mg via INTRAVENOUS

## 2022-05-12 MED ORDER — ONDANSETRON HCL 4 MG/2ML IJ SOLN
INTRAMUSCULAR | Status: AC
  Filled 2022-05-12: qty 2

## 2022-05-12 MED ORDER — GLYCOPYRROLATE PF 0.2 MG/ML IJ SOSY
PREFILLED_SYRINGE | INTRAMUSCULAR | Status: DC | PRN
  Administered 2022-05-12: .2 mg via INTRAVENOUS

## 2022-05-12 MED ORDER — CEFAZOLIN SODIUM-DEXTROSE 2-4 GM/100ML-% IV SOLN
2.0000 g | INTRAVENOUS | Status: AC
  Administered 2022-05-12: 2 g via INTRAVENOUS

## 2022-05-12 MED ORDER — DEXAMETHASONE SODIUM PHOSPHATE 10 MG/ML IJ SOLN
INTRAMUSCULAR | Status: AC
  Filled 2022-05-12: qty 1

## 2022-05-12 MED ORDER — FENTANYL CITRATE (PF) 100 MCG/2ML IJ SOLN
INTRAMUSCULAR | Status: AC
  Filled 2022-05-12: qty 2

## 2022-05-12 MED ORDER — SCOPOLAMINE 1 MG/3DAYS TD PT72
MEDICATED_PATCH | TRANSDERMAL | Status: AC
  Filled 2022-05-12: qty 1

## 2022-05-12 SURGICAL SUPPLY — 22 items
CATH ROBINSON RED A/P 16FR (CATHETERS) ×3 IMPLANT
CNTNR URN SCR LID CUP LEK RST (MISCELLANEOUS) IMPLANT
CONT SPEC 4OZ STRL OR WHT (MISCELLANEOUS) ×2
DEVICE MYOSURE LITE (MISCELLANEOUS) IMPLANT
DEVICE MYOSURE REACH (MISCELLANEOUS) IMPLANT
ELECT DISPERSIVE SONATA (ELECTRODE) ×6 IMPLANT
GAUZE 4X4 16PLY ~~LOC~~+RFID DBL (SPONGE) IMPLANT
GLOVE BIO SURGEON STRL SZ8 (GLOVE) ×3 IMPLANT
GOWN STRL REUS W/TWL LRG LVL3 (GOWN DISPOSABLE) ×3 IMPLANT
HANDPIECE RFA SONATA (MISCELLANEOUS) ×3 IMPLANT
IV NS IRRIG 3000ML ARTHROMATIC (IV SOLUTION) IMPLANT
KIT PROCEDURE FLUENT (KITS) IMPLANT
KIT TURNOVER CYSTO (KITS) ×3 IMPLANT
MYOSURE XL FIBROID (MISCELLANEOUS)
PACK VAGINAL MINOR WOMEN LF (CUSTOM PROCEDURE TRAY) ×3 IMPLANT
PAD OB MATERNITY 4.3X12.25 (PERSONAL CARE ITEMS) ×3 IMPLANT
PAD PREP 24X48 CUFFED NSTRL (MISCELLANEOUS) ×3 IMPLANT
SEAL ROD LENS SCOPE MYOSURE (ABLATOR) IMPLANT
SLEEVE SCD COMPRESS KNEE MED (STOCKING) ×6 IMPLANT
SYSTEM TISS REMOVAL MYOSURE XL (MISCELLANEOUS) IMPLANT
TOWEL OR 17X24 6PK STRL BLUE (TOWEL DISPOSABLE) ×3 IMPLANT
WATER STERILE IRR 500ML POUR (IV SOLUTION) IMPLANT

## 2022-05-12 NOTE — Anesthesia Procedure Notes (Signed)
Procedure Name: LMA Insertion Date/Time: 05/12/2022 1:00 PM  Performed by: Bishop Limbo, CRNAPre-anesthesia Checklist: Patient identified, Emergency Drugs available, Suction available and Patient being monitored Patient Re-evaluated:Patient Re-evaluated prior to induction Oxygen Delivery Method: Circle System Utilized Preoxygenation: Pre-oxygenation with 100% oxygen Induction Type: IV induction Ventilation: Mask ventilation without difficulty LMA: LMA inserted LMA Size: 4.0 Number of attempts: 1 Placement Confirmation: positive ETCO2 Tube secured with: Tape Dental Injury: Teeth and Oropharynx as per pre-operative assessment

## 2022-05-12 NOTE — Transfer of Care (Signed)
Immediate Anesthesia Transfer of Care Note  Patient: Barbara Clark  Procedure(s) Performed: Radio Frequency Ablation with Sonata (Uterus) HYSTEROSCOPIC IUD Removal (Vagina )  Patient Location: PACU  Anesthesia Type:General  Level of Consciousness: drowsy, patient cooperative, and responds to stimulation  Airway & Oxygen Therapy: Patient Spontanous Breathing  Post-op Assessment: Report given to RN and Post -op Vital signs reviewed and stable  Post vital signs: Reviewed and stable  Last Vitals:  Vitals Value Taken Time  BP 130/71 05/12/22 1430  Temp 37.3 C 05/12/22 1421  Pulse 83 05/12/22 1430  Resp 16 05/12/22 1430  SpO2 100 % 05/12/22 1430  Vitals shown include unvalidated device data.  Last Pain:  Vitals:   05/12/22 1143  TempSrc: Oral         Complications: No notable events documented.

## 2022-05-12 NOTE — Discharge Instructions (Signed)
  Post Anesthesia Home Care Instructions  Activity: Get plenty of rest for the remainder of the day. A responsible individual must stay with you for 24 hours following the procedure.  For the next 24 hours, DO NOT: -Drive a car -Advertising copywriter -Drink alcoholic beverages -Take any medication unless instructed by your physician -Make any legal decisions or sign important papers.  Meals: Start with liquid foods such as gelatin or soup. Progress to regular foods as tolerated. Avoid greasy, spicy, heavy foods. If nausea and/or vomiting occur, drink only clear liquids until the nausea and/or vomiting subsides. Call your physician if vomiting continues.  Special Instructions/Symptoms: Your throat may feel dry or sore from the anesthesia or the breathing tube placed in your throat during surgery. If this causes discomfort, gargle with warm salt water. The discomfort should disappear within 24 hours.  If you had a scopolamine patch placed behind your ear for the management of post- operative nausea and/or vomiting:  1. The medication in the patch is effective for 72 hours, after which it should be removed.  Wrap patch in a tissue and discard in the trash. Wash hands thoroughly with soap and water. 2. You may remove the patch earlier than 72 hours if you experience unpleasant side effects which may include dry mouth, dizziness or visual disturbances. 3. Avoid touching the patch. Wash your hands with soap and water after contact with the patch.    No acetaminophen/Tylenol until after 6 pm today if needed. Do not combine with Percocet (oxycodone-acetaminophen) No ibuprofen, Advil, Aleve, Motrin, ketorolac, meloxicam, naproxen, or other NSAIDS until after 8 pm today if needed.    D & C Home care Instructions:   Personal hygiene:  Used sanitary napkins for vaginal drainage not tampons. Shower or tub bathe the day after your procedure. No douching until bleeding stops. Always wipe from front to  back after  Elimination.  Activity: Do not drive or operate any equipment today. The effects of the anesthesia are still present and drowsiness may result. Rest today, not necessarily flat bed rest, just take it easy. You may resume your normal activity in one to 2 days.  Sexual activity: No intercourse for one week or as indicated by your physician  Diet: Eat a light diet as desired this evening. You may resume a regular diet tomorrow.  Return to work: One to 2 days.  General Expectations of your surgery: Vaginal bleeding should be no heavier than a normal period. Spotting may continue up to 10 days. Mild cramps may continue for a couple of days. You may have a regular period in 2-6 weeks.  Unexpected observations call your doctor if these occur: persistent or heavy bleeding. Severe abdominal cramping or pain. Elevation of temperature greater than 100F. Inability to urinate.  Call for an appointment in three weeks.

## 2022-05-12 NOTE — Anesthesia Preprocedure Evaluation (Addendum)
Anesthesia Evaluation  Patient identified by MRN, date of birth, ID band Patient awake    Reviewed: Allergy & Precautions, NPO status , Patient's Chart, lab work & pertinent test results  History of Anesthesia Complications (+) PONV and history of anesthetic complications  Airway Mallampati: III  TM Distance: >3 FB Neck ROM: Full    Dental no notable dental hx. (+) Dental Advisory Given   Pulmonary sleep apnea , former smoker   Pulmonary exam normal        Cardiovascular negative cardio ROS Normal cardiovascular exam     Neuro/Psych  PSYCHIATRIC DISORDERS Anxiety Depression    negative neurological ROS     GI/Hepatic negative GI ROS, Neg liver ROS,,,  Endo/Other  diabetes    Renal/GU negative Renal ROS     Musculoskeletal negative musculoskeletal ROS (+)    Abdominal   Peds  Hematology negative hematology ROS (+)   Anesthesia Other Findings   Reproductive/Obstetrics                             Anesthesia Physical Anesthesia Plan  ASA: 2  Anesthesia Plan: General   Post-op Pain Management: Tylenol PO (pre-op)* and Toradol IV (intra-op)*   Induction: Intravenous  PONV Risk Score and Plan: 4 or greater and Ondansetron, Dexamethasone, Midazolam and Scopolamine patch - Pre-op  Airway Management Planned: LMA  Additional Equipment:   Intra-op Plan:   Post-operative Plan: Extubation in OR  Informed Consent: I have reviewed the patients History and Physical, chart, labs and discussed the procedure including the risks, benefits and alternatives for the proposed anesthesia with the patient or authorized representative who has indicated his/her understanding and acceptance.     Dental advisory given  Plan Discussed with: Anesthesiologist and CRNA  Anesthesia Plan Comments:        Anesthesia Quick Evaluation

## 2022-05-12 NOTE — Op Note (Signed)
PREOPERATIVE DIAGNOSIS: Symptomatic uterine fibroids, malpositioned IUD POSTOPERATIVE DIAGNOSIS: The same PROCEDURE: Transcervical Sonata uterine fibroid radiofrequency ablation, Hysteroscopic removal of foreign body. SURGEON:  Dr. Mariel Aloe   INDICATIONS: 47 y.o. [phone]  here for scheduled surgery for the aforementioned diagnoses.   Risks of surgery were discussed with the patient including but not limited to: bleeding which may require transfusion; infection which may require antibiotics; injury to uterus or surrounding organs; intrauterine scarring which may impair future fertility; need for additional procedures including laparotomy or laparoscopy; and other postoperative/anesthesia complications. Written informed consent was obtained.       FINDINGS: Retained IUD and strings.  Diffuse proliferative endometrium.  Normal ostia bilaterally.  ANESTHESIA:   General, paracervical block with 10 ml of 0.5% Marcaine INTRAVENOUS FLUIDS:  500 ml of LR FLUID DEFICITS:  10ml of NS ESTIMATED BLOOD LOSS:  Less than 10 ml UOP: 100 ml SPECIMENS: retained IUD COMPLICATIONS:  None immediate.  FINDINGS:  A 14 week size uterus.   Fibroid A: 6 cm Fibroid  B: 4 cm  PROCEDURE DETAILS:  The patient was then taken to the operating room where general anesthesia was administered and was found to be adequate.  After an adequate timeout was performed, she was placed in the dorsal lithotomy position and examined; then prepped and draped in the sterile manner.   Her bladder was catheterized for an unmeasured amount of clear, yellow urine. A speculum was then placed in the patient's vagina and a single tooth tenaculum was applied to the anterior lip of the cervix.   A paracervical block using 30 ml of 0.5% Marcaine was administered.  The uterus was sounded to 10 cm and the cervix was dilated manually with metal dilators to accommodate the 5 mm diagnostic hysteroscope.  The hysteroscope was then inserted under  direct visualization using NS as a suspension medium.  The uterine cavity was carefully examined with the findings as noted above.   The IUD and its strings were easily visualized.  A hysteroscopic grasper was used to retrieve the IUD.       The cervix was then easily dilated to a #27 Pratt dilator.   The Sonata handpiece was then inserted and a complete uterine survey was performed with the ultrasound.  Sonata fibroid ablation was then performed.  Fibroid 1 was  measured to be 6 cm.  3 ablation cycles were performed.  Ablation 1: 4.2 x 3.4 cm, 5 min 30 seconds Ablation 2: 3.5 x 2.7,  4 min 7 seconds Ablation 3: 2.8 x 2.0, 2 minutes and 36 seconds  Fibroid 2 measured to be 4 cm.  Ablation 1: 3.1 x 2.3 cm, 3 minutes 12 seconds Ablation 2: 3.4 x 2.5 cm, 3 minutes and 42 seconds  All cycles were carried out under direct ultrasound intrauterine guidance and visualization with the ablation guide noted to be within the serosa at all times.  The fibroids appeared ablated with u/s guidance and outgassing noted by U/S appearance.  Sponge, needle, and instrument account was correct x 2.  All instruments were removed from the vagina.  Hemostasis was assured.  The patient was then awakened and taken to the recovery room I stable condition, tolerating the procedure well.  The patient will be discharged to home as per PACU criteria.  Routine postoperative instructions given.  She was received toradol before waking up and got a prescription for percocet 5/325 mg to go home.  She will follow up in the clinic in 2-3 weeks  for postoperative evaluation.   Mariel Aloe, MD, FACOG Obstetrician & Gynecologist, [facility] for [facility], [facility]

## 2022-05-12 NOTE — Anesthesia Postprocedure Evaluation (Signed)
Anesthesia Post Note  Patient: Barbara Clark  Procedure(s) Performed: Radio Frequency Ablation with Sonata (Uterus) HYSTEROSCOPIC IUD Removal (Vagina )     Patient location during evaluation: PACU Anesthesia Type: General Level of consciousness: sedated Pain management: pain level controlled Vital Signs Assessment: post-procedure vital signs reviewed and stable Respiratory status: spontaneous breathing and respiratory function stable Cardiovascular status: stable Postop Assessment: no apparent nausea or vomiting Anesthetic complications: no   No notable events documented.  Last Vitals:  Vitals:   05/12/22 1545 05/12/22 1600  BP: 135/75 127/72  Pulse: 86 88  Resp: 16 16  Temp: 36.5 C   SpO2: 96% 97%    Last Pain:  Vitals:   05/12/22 1545  TempSrc:   PainSc: 0-No pain                 Satara Virella DANIEL

## 2022-05-12 NOTE — Interval H&P Note (Signed)
History and Physical Interval Note:  05/12/2022 12:40 PM  Barbara Clark  has presented today for surgery, with the diagnosis of Fibroids Mising Strings.  The various methods of treatment have been discussed with the patient and family. After consideration of risks, benefits and other options for treatment, the patient has consented to  Procedure(s): Radio Frequency Ablation with Sonata (N/A) HYSTEROSCOPIC IUD Removal (N/A) as a surgical intervention.  The patient's history has been reviewed, patient examined, no change in status, stable for surgery.  I have reviewed the patient's chart and labs.  Questions were answered to the patient's satisfaction.     Warden Fillers

## 2022-05-13 LAB — SURGICAL PATHOLOGY, GROSS ONLY (NOT ARMC)

## 2022-05-14 ENCOUNTER — Encounter (HOSPITAL_BASED_OUTPATIENT_CLINIC_OR_DEPARTMENT_OTHER): Payer: Self-pay | Admitting: Obstetrics and Gynecology

## 2022-05-14 DIAGNOSIS — E1065 Type 1 diabetes mellitus with hyperglycemia: Secondary | ICD-10-CM | POA: Diagnosis not present

## 2022-05-15 DIAGNOSIS — F4310 Post-traumatic stress disorder, unspecified: Secondary | ICD-10-CM | POA: Diagnosis not present

## 2022-05-20 DIAGNOSIS — G4733 Obstructive sleep apnea (adult) (pediatric): Secondary | ICD-10-CM | POA: Diagnosis not present

## 2022-05-25 DIAGNOSIS — Z8639 Personal history of other endocrine, nutritional and metabolic disease: Secondary | ICD-10-CM | POA: Diagnosis not present

## 2022-05-25 DIAGNOSIS — Z794 Long term (current) use of insulin: Secondary | ICD-10-CM | POA: Diagnosis not present

## 2022-05-25 DIAGNOSIS — E1065 Type 1 diabetes mellitus with hyperglycemia: Secondary | ICD-10-CM | POA: Diagnosis not present

## 2022-05-25 DIAGNOSIS — E1042 Type 1 diabetes mellitus with diabetic polyneuropathy: Secondary | ICD-10-CM | POA: Diagnosis not present

## 2022-05-26 DIAGNOSIS — F4310 Post-traumatic stress disorder, unspecified: Secondary | ICD-10-CM | POA: Diagnosis not present

## 2022-06-01 ENCOUNTER — Ambulatory Visit (INDEPENDENT_AMBULATORY_CARE_PROVIDER_SITE_OTHER): Payer: Medicaid Other | Admitting: Obstetrics and Gynecology

## 2022-06-01 VITALS — BP 139/84 | HR 86 | Wt 242.0 lb

## 2022-06-01 DIAGNOSIS — Z4889 Encounter for other specified surgical aftercare: Secondary | ICD-10-CM

## 2022-06-01 DIAGNOSIS — E1065 Type 1 diabetes mellitus with hyperglycemia: Secondary | ICD-10-CM | POA: Diagnosis not present

## 2022-06-01 NOTE — Progress Notes (Signed)
    Subjective:    Barbara Clark is a 47 y.o. female who presents to the clinic status post Sonata fibroid ablation on 05/12/22. The patient is not having any pain.  Eating a regular diet without difficulty. Bowel movements are normal. No other significant postoperative concerns.  The following portions of the patient's history were reviewed and updated as appropriate: allergies, current medications, past family history, past medical history, past social history, past surgical history, and problem list..  Last pap smear was normal on 08/12/21.  Review of Systems Pertinent items are noted in HPI.   Objective:   BP 139/84   Pulse 86   Wt 242 lb (109.8 kg)   LMP 03/20/2022 (Approximate)   BMI 37.90 kg/m  Constitutional:  Well-developed, well-nourished female in no acute distress.   Skin: Skin is warm and dry, no rash noted, not diaphoretic,no erythema, no pallor.  Cardiovascular: Normal heart rate noted  Respiratory: Effort and breath sounds normal, no problems with respiration noted  Abdomen: Soft, bowel sounds active, non-tender, no abnormal masses  Incision: Healing well, no drainage, no erythema, no hernia, no seroma, no swelling, no dehiscence, incision well approximated  Pelvic:   Deferred   Surgical pathology () IUD confirmed removed  Assessment:   Doing well postoperatively.  Operative findings again reviewed. Pathology report discussed.   Plan:   1. Continue any current medications. 2. Wound care discussed. 3. Activity restrictions: none 4. Anticipated return to work: now. 5. Follow up as needed 6.  Routine preventative health maintenance measures emphasized. Please refer to After Visit Summary for other counseling recommendations.    Mariel Aloe, MD, FACOG Attending Obstetrician & Gynecologist Center for [facility], [facility]

## 2022-06-03 ENCOUNTER — Encounter: Payer: Self-pay | Admitting: Obstetrics and Gynecology

## 2022-06-15 ENCOUNTER — Other Ambulatory Visit: Payer: Self-pay | Admitting: Family Medicine

## 2022-06-16 ENCOUNTER — Other Ambulatory Visit: Payer: Self-pay

## 2022-06-16 DIAGNOSIS — E1065 Type 1 diabetes mellitus with hyperglycemia: Secondary | ICD-10-CM | POA: Diagnosis not present

## 2022-06-16 MED ORDER — LOSARTAN POTASSIUM 25 MG PO TABS: 25.0000 mg | ORAL_TABLET | Freq: Every day | ORAL | 1 refills | Status: DC

## 2022-06-20 DIAGNOSIS — G4733 Obstructive sleep apnea (adult) (pediatric): Secondary | ICD-10-CM | POA: Diagnosis not present

## 2022-06-22 ENCOUNTER — Ambulatory Visit (HOSPITAL_BASED_OUTPATIENT_CLINIC_OR_DEPARTMENT_OTHER): Payer: Medicaid Other | Attending: Internal Medicine | Admitting: Internal Medicine

## 2022-06-22 VITALS — Ht 67.0 in | Wt 243.0 lb

## 2022-06-22 DIAGNOSIS — G4733 Obstructive sleep apnea (adult) (pediatric): Secondary | ICD-10-CM | POA: Insufficient documentation

## 2022-06-25 ENCOUNTER — Ambulatory Visit (INDEPENDENT_AMBULATORY_CARE_PROVIDER_SITE_OTHER): Payer: Medicaid Other | Admitting: Obstetrics and Gynecology

## 2022-06-25 VITALS — BP 123/80 | HR 105 | Wt 244.0 lb

## 2022-06-25 DIAGNOSIS — N939 Abnormal uterine and vaginal bleeding, unspecified: Secondary | ICD-10-CM

## 2022-06-25 DIAGNOSIS — D252 Subserosal leiomyoma of uterus: Secondary | ICD-10-CM | POA: Diagnosis not present

## 2022-06-25 DIAGNOSIS — D251 Intramural leiomyoma of uterus: Secondary | ICD-10-CM | POA: Diagnosis not present

## 2022-06-25 MED ORDER — MEGESTROL ACETATE 40 MG PO TABS
40.0000 mg | ORAL_TABLET | Freq: Two times a day (BID) | ORAL | 5 refills | Status: DC
Start: 2022-06-25 — End: 2023-10-07

## 2022-06-25 NOTE — Progress Notes (Signed)
  CC: DUB Subjective:    Patient ID: Barbara Clark, female    DOB: 1975-07-24, 47 y.o.   MRN: [id number]  HPI Pt seen.  Pt notes after procedure and postop she has had continued vaginal bleeding.  Pt notes she usually bleeds enough to use 4-5 pads/day.  Patient notes increased fatigue; however, she also notes that the bleeding has stopped as of today.  Explained that she is still within the early treatment window, but the continued bleeding is concerning.   Review of Systems     Objective:   Physical Exam Vitals:   06/25/22 1547  BP: 123/80  Pulse: (!) 105         Assessment & Plan:   1. Abnormal uterine bleeding Will check CBC to evaluate for new anemia due to fatigue symptoms.  Will check pelvic ultrasound to evaluate for any fibroid shrinkage. Rx for megace sent to counteract any continued bleeding.   - CBC - US PELVIC COMPLETE WITH TRANSVAGINAL; Future - megestrol (MEGACE) 40 MG tablet; Take 1 tablet (40 mg total) by mouth 2 (two) times daily. Can increase to two tablets twice a day in the event of heavy bleeding  Dispense: 60 tablet; Refill: 5  2. Intramural and subserous leiomyoma of uterus If bleeding continues may need to consider definitive therapy with hysterectomy.   - US PELVIC COMPLETE WITH TRANSVAGINAL; Future   Warden Fillers, MD Faculty Attending, Center for [facility]

## 2022-06-25 NOTE — Progress Notes (Signed)
Pt states she has been having some heavy bleeding since her procedure.  Pt has had severe fatigue and has started taking Iron supplement. Bleeding has since stopped as of today.

## 2022-06-26 LAB — CBC
Hematocrit: 35.2 % (ref 34.0–46.6)
Hemoglobin: 11.7 g/dL (ref 11.1–15.9)
MCH: 29.8 pg (ref 26.6–33.0)
MCHC: 33.2 g/dL (ref 31.5–35.7)
MCV: 90 fL (ref 79–97)
Platelets: 432 10*3/uL (ref 150–450)
RBC: 3.92 x10E6/uL (ref 3.77–5.28)
RDW: 12 % (ref 11.7–15.4)
WBC: 7.9 10*3/uL (ref 3.4–10.8)

## 2022-07-02 DIAGNOSIS — E1065 Type 1 diabetes mellitus with hyperglycemia: Secondary | ICD-10-CM | POA: Diagnosis not present

## 2022-07-03 DIAGNOSIS — F4310 Post-traumatic stress disorder, unspecified: Secondary | ICD-10-CM | POA: Diagnosis not present

## 2022-07-05 DIAGNOSIS — G4733 Obstructive sleep apnea (adult) (pediatric): Secondary | ICD-10-CM | POA: Diagnosis not present

## 2022-07-05 NOTE — Procedures (Signed)
    Patient Name: Barbara Clark, [place] Date: 06/22/2022 Gender: Female D.O.B: May 02, 1975 Age (years): 40 Referring Provider: Jetty Duhamel MD, ABSM Height (inches): 67 Interpreting Physician: Jetty Duhamel MD, ABSM Weight (lbs): 243 RPSGT: Ulyess Mort BMI: 38 MRN: [id number] Neck Size: 18.00  CLINICAL INFORMATION Sleep Study Type: NPSG Indication for sleep study: Daytime Fatigue, Depression, Diabetes, Hypertension, Obesity, Snoring Epworth Sleepiness Score: 14  Most recent polysomnogram dated 10/15/2020 revealed an AHI of 26/h.  SLEEP STUDY TECHNIQUE As per the AASM Manual for the Scoring of Sleep and Associated Events v2.3 (April 2016) with a hypopnea requiring 4% desaturations.  The channels recorded and monitored were frontal, central and occipital EEG, electrooculogram (EOG), submentalis EMG (chin), nasal and oral airflow, thoracic and abdominal wall motion, anterior tibialis EMG, snore microphone, electrocardiogram, and pulse oximetry.  MEDICATIONS Medications self-administered by patient taken the night of the study : ATORVASTATIN, insulin pump novalog, losartan potassium  SLEEP ARCHITECTURE The study was initiated at 10:04:28 PM and ended at 4:39:06 AM.  Sleep onset time was 15.6 minutes and the sleep efficiency was 62.3%. The total sleep time was 246 minutes.  Stage REM latency was 87.5 minutes.  The patient spent 6.1% of the night in stage N1 sleep, 75.8% in stage N2 sleep, 0.8% in stage N3 and 17.3% in REM.  Alpha intrusion was absent.  Supine sleep was 43.31%.  RESPIRATORY PARAMETERS The overall apnea/hypopnea index (AHI) was 49.0 per hour. There were 8 total apneas, including 8 obstructive, 0 central and 0 mixed apneas. There were 193 hypopneas and 52 RERAs.  The AHI during Stage REM sleep was 93.2 per hour.  AHI while supine was 70.4 per hour.  The mean oxygen saturation was 94.0%. The minimum SpO2 during sleep was 74.0%.  moderate snoring was noted  during this study.  CARDIAC DATA The 2 lead EKG demonstrated sinus rhythm. The mean heart rate was 85.9 beats per minute. Other EKG findings include: None.  LEG MOVEMENT DATA The total PLMS were 0 with a resulting PLMS index of 0.0. Associated arousal with leg movement index was 0.0 .  IMPRESSIONS - Severe obstructive sleep apnea occurred during this study (AHI = 49.0/h). - Moderate oxygen desaturation was noted during this study (Min O2 = 74.0%, Mean 94%). - The patient snored with moderate snoring volume. - No cardiac abnormalities were noted during this study. - Clinically significant periodic limb movements did not occur during sleep. No significant associated arousals.  DIAGNOSIS - Obstructive Sleep Apnea (G47.33)  RECOMMENDATIONS - Suggest CPAP titration sleep study or autopap. Other options would be based on clinical judgment. - Avoid alcohol, sedatives and other CNS depressants that may worsen sleep apnea and disrupt normal sleep architecture. - Sleep hygiene should be reviewed to assess factors that may improve sleep quality. - Weight management and regular exercise should be initiated or continued if appropriate.  [Electronically signed] 07/05/2022 10:52 AM  Jetty Duhamel MD, ABSM Diplomate, American Board of Sleep Medicine NPI: [id number]                         Jetty Duhamel Diplomate, American Board of Sleep Medicine  ELECTRONICALLY SIGNED ON:  07/05/2022, 10:48 AM [facility] PH: [phone]   FX: [phone] ACCREDITED BY THE AMERICAN ACADEMY OF SLEEP MEDICINE

## 2022-07-09 ENCOUNTER — Ambulatory Visit (HOSPITAL_BASED_OUTPATIENT_CLINIC_OR_DEPARTMENT_OTHER)
Admission: RE | Admit: 2022-07-09 | Discharge: 2022-07-09 | Disposition: A | Discharge status: Home / Self Care | Payer: Medicaid Other | Source: Ambulatory Visit | Attending: Obstetrics and Gynecology | Admitting: Obstetrics and Gynecology

## 2022-07-09 DIAGNOSIS — D251 Intramural leiomyoma of uterus: Secondary | ICD-10-CM | POA: Diagnosis not present

## 2022-07-09 DIAGNOSIS — N939 Abnormal uterine and vaginal bleeding, unspecified: Secondary | ICD-10-CM | POA: Insufficient documentation

## 2022-07-09 DIAGNOSIS — D252 Subserosal leiomyoma of uterus: Secondary | ICD-10-CM | POA: Diagnosis not present

## 2022-07-13 ENCOUNTER — Ambulatory Visit
Admission: RE | Admit: 2022-07-13 | Discharge: 2022-07-13 | Disposition: A | Discharge status: Home / Self Care | Payer: Medicaid Other | Source: Ambulatory Visit | Attending: Family Medicine | Admitting: Family Medicine

## 2022-07-13 DIAGNOSIS — Z1231 Encounter for screening mammogram for malignant neoplasm of breast: Secondary | ICD-10-CM

## 2022-07-14 ENCOUNTER — Ambulatory Visit (INDEPENDENT_AMBULATORY_CARE_PROVIDER_SITE_OTHER): Payer: Medicaid Other | Admitting: Family Medicine

## 2022-07-14 ENCOUNTER — Ambulatory Visit (HOSPITAL_COMMUNITY)
Admission: RE | Admit: 2022-07-14 | Discharge: 2022-07-14 | Disposition: A | Discharge status: Home / Self Care | Payer: Medicaid Other | Source: Ambulatory Visit | Attending: Family Medicine | Admitting: Family Medicine

## 2022-07-14 ENCOUNTER — Encounter: Payer: Self-pay | Admitting: Family Medicine

## 2022-07-14 VITALS — BP 106/67 | HR 83 | Ht 67.0 in | Wt 241.2 lb

## 2022-07-14 DIAGNOSIS — M25562 Pain in left knee: Secondary | ICD-10-CM | POA: Insufficient documentation

## 2022-07-14 DIAGNOSIS — G8929 Other chronic pain: Secondary | ICD-10-CM

## 2022-07-14 DIAGNOSIS — M25469 Effusion, unspecified knee: Secondary | ICD-10-CM

## 2022-07-14 DIAGNOSIS — M25561 Pain in right knee: Secondary | ICD-10-CM | POA: Insufficient documentation

## 2022-07-14 DIAGNOSIS — M25473 Effusion, unspecified ankle: Secondary | ICD-10-CM | POA: Diagnosis not present

## 2022-07-14 DIAGNOSIS — M7989 Other specified soft tissue disorders: Secondary | ICD-10-CM | POA: Diagnosis not present

## 2022-07-14 DIAGNOSIS — M1712 Unilateral primary osteoarthritis, left knee: Secondary | ICD-10-CM | POA: Diagnosis not present

## 2022-07-14 HISTORY — DX: Effusion, unspecified ankle: M25.473

## 2022-07-14 MED ORDER — IBUPROFEN 400 MG PO TABS: 400.0000 mg | ORAL_TABLET | Freq: Three times a day (TID) | ORAL | 0 refills | Status: DC | PRN

## 2022-07-14 NOTE — Patient Instructions (Signed)
Knee Exercises Ask your health care provider which exercises are safe for you. Do exercises exactly as told by your health care provider and adjust them as directed. It is normal to feel mild stretching, pulling, tightness, or discomfort as you do these exercises. Stop right away if you feel sudden pain or your pain gets worse. Do not begin these exercises until told by your health care provider. Stretching and range-of-motion exercises These exercises warm up your muscles and joints and improve the movement and flexibility of your knee. These exercises also help to relieve pain and swelling. Knee extension, prone  Lie on your abdomen (prone position) on a bed. Place your left / right knee just beyond the edge of the surface so your knee is not on the bed. You can put a towel under your left / right thigh just above your kneecap for comfort. Relax your leg muscles and allow gravity to straighten your knee (extension). You should feel a stretch behind your left / right knee. Hold this position for __________ seconds. Scoot up so your knee is supported between repetitions. Repeat __________ times. Complete this exercise __________ times a day. Knee flexion, active  Lie on your back with both legs straight. If this causes back discomfort, bend your left / right knee so your foot is flat on the floor. Slowly slide your left / right heel back toward your buttocks. Stop when you feel a gentle stretch in the front of your knee or thigh (flexion). Hold this position for __________ seconds. Slowly slide your left / right heel back to the starting position. Repeat __________ times. Complete this exercise __________ times a day. Quadriceps stretch, prone  Lie on your abdomen on a firm surface, such as a bed or padded floor. Bend your left / right knee and hold your ankle. If you cannot reach your ankle or pant leg, loop a belt around your foot and grab the belt instead. Gently pull your heel toward your  buttocks. Your knee should not slide out to the side. You should feel a stretch in the front of your thigh and knee (quadriceps). Hold this position for __________ seconds. Repeat __________ times. Complete this exercise __________ times a day. Hamstring, supine  Lie on your back (supine position). Loop a belt or towel over the ball of your left / right foot. The ball of your foot is on the walking surface, right under your toes. Straighten your left / right knee and slowly pull on the belt to raise your leg until you feel a gentle stretch behind your knee (hamstring). Do not let your knee bend while you do this. Keep your other leg flat on the floor. Hold this position for __________ seconds. Repeat __________ times. Complete this exercise __________ times a day. Strengthening exercises These exercises build strength and endurance in your knee. Endurance is the ability to use your muscles for a long time, even after they get tired. Quadriceps, isometric This exercise strengthens the muscles in front of your thigh (quadriceps) without moving your knee joint (isometric). Lie on your back with your left / right leg extended and your other knee bent. Put a rolled towel or small pillow under your knee if told by your health care provider. Slowly tense the muscles in the front of your left / right thigh. You should see your kneecap slide up toward your hip or see increased dimpling just above the knee. This motion will push the back of the knee toward the floor.   For __________ seconds, hold the muscle as tight as you can without increasing your pain. Relax the muscles slowly and completely. Repeat __________ times. Complete this exercise __________ times a day. Straight leg raises This exercise strengthens the muscles in front of your thigh (quadriceps) and the muscles that move your hips (hip flexors). Lie on your back with your left / right leg extended and your other knee bent. Tense the  muscles in the front of your left / right thigh. You should see your kneecap slide up or see increased dimpling just above the knee. Your thigh may even shake a bit. Keep these muscles tight as you raise your leg 4-6 inches (10-15 cm) off the floor. Do not let your knee bend. Hold this position for __________ seconds. Keep these muscles tense as you lower your leg. Relax your muscles slowly and completely after each repetition. Repeat __________ times. Complete this exercise __________ times a day. Hamstring, isometric  Lie on your back on a firm surface. Bend your left / right knee about __________ degrees. Dig your left / right heel into the surface as if you are trying to pull it toward your buttocks. Tighten the muscles in the back of your thighs (hamstring) to "dig" as hard as you can without increasing any pain. Hold this position for __________ seconds. Release the tension gradually and allow your muscles to relax completely for __________ seconds after each repetition. Repeat __________ times. Complete this exercise __________ times a day. Hamstring curls If told by your health care provider, do this exercise while wearing ankle weights. Begin with __________lb / kg weights. Then increase the weight by 1 lb (0.5 kg) increments. Do not wear ankle weights that are more than __________lb / kg. Lie on your abdomen with your legs straight. Bend your left / right knee as far as you can without feeling pain. Keep your hips flat against the floor. Hold this position for __________ seconds. Slowly lower your leg to the starting position. Repeat __________ times. Complete this exercise __________ times a day. Squats This exercise strengthens the muscles in front of your thigh and knee (quadriceps). Stand in front of a table, with your feet and knees pointing straight ahead. You may rest your hands on the table for balance but not for support. Slowly bend your knees and lower your hips like you  are going to sit in a chair. Keep your weight over your heels, not over your toes. Keep your lower legs upright so they are parallel with the table legs. Do not let your hips go lower than your knees. Do not bend lower than told by your health care provider. If your knee pain increases, do not bend as low. Hold the squat position for __________ seconds. Slowly push with your legs to return to standing. Do not use your hands to pull yourself to standing. Repeat __________ times. Complete this exercise __________ times a day. Wall slides This exercise strengthens the muscles in front of your thigh and knee (quadriceps). Lean your back against a smooth wall or door, and walk your feet out 18-24 inches (46-61 cm) from it. Place your feet hip-width apart. Slowly slide down the wall or door until your knees bend __________ degrees. Keep your knees over your heels, not over your toes. Keep your knees in line with your hips. Hold this position for __________ seconds. Repeat __________ times. Complete this exercise __________ times a day. Straight leg raises, side-lying This exercise strengthens the muscles that rotate   the leg at the hip and move it away from your body (hip abductors). Lie on your side with your left / right leg in the top position. Lie so your head, shoulder, knee, and hip line up. You may bend your bottom knee to help you keep your balance. Roll your hips slightly forward so your hips are stacked directly over each other and your left / right knee is facing forward. Leading with your heel, lift your top leg 4-6 inches (10-15 cm). You should feel the muscles in your outer hip lifting. Do not let your foot drift forward. Do not let your knee roll toward the ceiling. Hold this position for __________ seconds. Slowly return your leg to the starting position. Let your muscles relax completely after each repetition. Repeat __________ times. Complete this exercise __________ times a  day. Straight leg raises, prone This exercise stretches the muscles that move your hips away from the front of the pelvis (hip extensors). Lie on your abdomen on a firm surface. You can put a pillow under your hips if that is more comfortable. Tense the muscles in your buttocks and lift your left / right leg about 4-6 inches (10-15 cm). Keep your knee straight as you lift your leg. Hold this position for __________ seconds. Slowly lower your leg to the starting position. Let your leg relax completely after each repetition. Repeat __________ times. Complete this exercise __________ times a day. This information is not intended to replace advice given to you by your health care provider. Make sure you discuss any questions you have with your health care provider. Document Revised: 09/17/2020 Document Reviewed: 09/17/2020 Elsevier Patient Education  2024 Elsevier Inc.  

## 2022-07-14 NOTE — Progress Notes (Signed)
    SUBJECTIVE:   CHIEF COMPLAINT / HPI:   Knee pain & Swelling: C/O has been experiencing worsening left knee swelling and pain and right knee pain for the past few weeks. This is not a new problem. She started exercising and going to the Gym recently and felt this might have contributed to the worsening of her symptoms. Hence, she cut back on exercising with no improvement. She denies any direct trauma or injuries to her knees. Voltaren gel does not improve her pain. She got OTC Biofreeze spray with improvement. Her pain is currently 7/10 in severity but can be more than 10/10 in severity at its' worst.  Ankle welling: She also has intermittent ankle swelling, which worsens with prolonged standing or after eating salt-containing food like pickles. She is using compression socks with some improvement.   PERTINENT  PMH / PSH: PMHx reviewed  OBJECTIVE:   BP 106/67   Pulse 83   Ht 5\' 7"  (1.702 m)   Wt 241 lb 3.2 oz (109.4 kg)   LMP 07/10/2022   SpO2 99%   BMI 37.78 kg/m   Physical Exam Vitals and nursing note reviewed.  Cardiovascular:     Rate and Rhythm: Normal rate and regular rhythm.     Heart sounds: Normal heart sounds. No murmur heard. Pulmonary:     Effort: Pulmonary effort is normal. No respiratory distress.     Breath sounds: Normal breath sounds. No wheezing.  Musculoskeletal:     Right knee: No swelling, deformity or effusion. Normal range of motion.     Left knee: Swelling present. No deformity or effusion. Normal range of motion.     Right foot: Normal range of motion. No swelling.     Left foot: Normal range of motion. No swelling.     Comments: Mild swelling of the lateral border of her left knee and mild to moderate tenderness of the medial border of her left knee      ASSESSMENT/PLAN:   Knee pain Acute on chronic B/L knee pain with left knee swelling.  ?? OA vs over use. Xray of her knees ordered. Ibuprofen 400 mg prn pain ordered. Home knee exercise  information provided. COnsider PT eval if symptoms persists. She agreed with the plan.   Ankle swelling ?? Due to prolonged standing at work. Currently asymptomatic. She will  plan to cut back on salt intake. Take break at work to elevate her LL intermittently. Continue compression stockings. F/U if no improvement.    COVID-19 vaccine update discussed. She will get this at the pharmacy.  Janit Pagan, MD [facility]

## 2022-07-14 NOTE — Assessment & Plan Note (Signed)
??   Due to prolonged standing at work. Currently asymptomatic. She will  plan to cut back on salt intake. Take break at work to elevate her LL intermittently. Continue compression stockings. F/U if no improvement.

## 2022-07-14 NOTE — Assessment & Plan Note (Signed)
Acute on chronic B/L knee pain with left knee swelling.  ?? OA vs over use. Xray of her knees ordered. Ibuprofen 400 mg prn pain ordered. Home knee exercise information provided. COnsider PT eval if symptoms persists. She agreed with the plan.

## 2022-07-17 ENCOUNTER — Telehealth: Payer: Self-pay | Admitting: Family Medicine

## 2022-07-17 DIAGNOSIS — E1065 Type 1 diabetes mellitus with hyperglycemia: Secondary | ICD-10-CM | POA: Diagnosis not present

## 2022-07-17 DIAGNOSIS — M170 Bilateral primary osteoarthritis of knee: Secondary | ICD-10-CM

## 2022-07-17 DIAGNOSIS — F4310 Post-traumatic stress disorder, unspecified: Secondary | ICD-10-CM | POA: Diagnosis not present

## 2022-07-17 NOTE — Addendum Note (Signed)
Addended by: Janit Pagan T on: 07/17/2022 02:44 PM   Modules accepted: Orders

## 2022-07-17 NOTE — Telephone Encounter (Addendum)
Result discussed. PT recommended. She agreed with the plan.  DG Knee Complete 4 Views Right  Result Date: 07/17/2022 CLINICAL DATA:  Anterior right knee pain. EXAM: RIGHT KNEE - COMPLETE 4+ VIEW COMPARISON:  Right knee radiographs 10/30/2014 FINDINGS: Minimal medial compartment joint space narrowing. Minimal chronic enthesopathic change at the quadriceps insertion on the patella, new from prior. Minimal new chronic enthesopathic change at the patellar tendon origin off the patella. No joint effusion. No acute fracture or dislocation. IMPRESSION: Minimal medial compartment joint space narrowing. Electronically Signed   By: Neita Garnet M.D.   On: 07/17/2022 14:20   DG Knee Complete 4 Views Left  Result Date: 07/17/2022 CLINICAL DATA:  Chronic knee pain and swelling. Anteromedial left knee pain and swelling. EXAM: LEFT KNEE - COMPLETE 4+ VIEW COMPARISON:  Left knee radiographs 12/16/2015 FINDINGS: Mild medial coronoid joint space narrowing is similar to prior. Minimal peripheral medial aspect of medial tibial plateau degenerative spurring appears new. No joint effusion. No acute fracture or dislocation. IMPRESSION: Minimal medial compartment osteoarthritis. No acute fracture. Electronically Signed   By: Neita Garnet M.D.   On: 07/17/2022 14:14

## 2022-07-21 ENCOUNTER — Encounter: Payer: Self-pay | Admitting: Family Medicine

## 2022-07-21 DIAGNOSIS — M1712 Unilateral primary osteoarthritis, left knee: Secondary | ICD-10-CM

## 2022-07-21 DIAGNOSIS — M1711 Unilateral primary osteoarthritis, right knee: Secondary | ICD-10-CM

## 2022-07-24 ENCOUNTER — Ambulatory Visit: Payer: Medicaid Other | Admitting: Internal Medicine

## 2022-07-24 DIAGNOSIS — E1042 Type 1 diabetes mellitus with diabetic polyneuropathy: Secondary | ICD-10-CM | POA: Diagnosis not present

## 2022-07-24 DIAGNOSIS — Z794 Long term (current) use of insulin: Secondary | ICD-10-CM | POA: Diagnosis not present

## 2022-07-24 DIAGNOSIS — E1065 Type 1 diabetes mellitus with hyperglycemia: Secondary | ICD-10-CM | POA: Diagnosis not present

## 2022-07-24 DIAGNOSIS — Z8639 Personal history of other endocrine, nutritional and metabolic disease: Secondary | ICD-10-CM | POA: Diagnosis not present

## 2022-07-27 ENCOUNTER — Encounter: Payer: Self-pay | Admitting: Family Medicine

## 2022-07-29 LAB — HM DIABETES EYE EXAM

## 2022-07-30 ENCOUNTER — Other Ambulatory Visit: Payer: Self-pay | Admitting: Thoracic Surgery (Cardiothoracic Vascular Surgery)

## 2022-07-30 DIAGNOSIS — R222 Localized swelling, mass and lump, trunk: Secondary | ICD-10-CM

## 2022-07-31 ENCOUNTER — Encounter: Payer: Self-pay | Admitting: Family Medicine

## 2022-07-31 ENCOUNTER — Ambulatory Visit (HOSPITAL_COMMUNITY)
Admission: RE | Admit: 2022-07-31 | Discharge: 2022-07-31 | Disposition: A | Discharge status: Home / Self Care | Payer: MEDICAID | Source: Ambulatory Visit | Attending: Family Medicine | Admitting: Family Medicine

## 2022-07-31 ENCOUNTER — Encounter (HOSPITAL_COMMUNITY): Payer: Self-pay

## 2022-07-31 DIAGNOSIS — M1711 Unilateral primary osteoarthritis, right knee: Secondary | ICD-10-CM

## 2022-07-31 DIAGNOSIS — M1712 Unilateral primary osteoarthritis, left knee: Secondary | ICD-10-CM | POA: Diagnosis not present

## 2022-08-04 ENCOUNTER — Ambulatory Visit: Payer: MEDICAID | Admitting: Thoracic Surgery (Cardiothoracic Vascular Surgery)

## 2022-08-06 ENCOUNTER — Ambulatory Visit (HOSPITAL_COMMUNITY)
Admission: RE | Admit: 2022-08-06 | Discharge: 2022-08-06 | Disposition: A | Discharge status: Home / Self Care | Payer: MEDICAID | Source: Ambulatory Visit | Attending: Family Medicine | Admitting: Family Medicine

## 2022-08-06 DIAGNOSIS — M1712 Unilateral primary osteoarthritis, left knee: Secondary | ICD-10-CM | POA: Insufficient documentation

## 2022-08-06 DIAGNOSIS — M1711 Unilateral primary osteoarthritis, right knee: Secondary | ICD-10-CM | POA: Diagnosis present

## 2022-08-07 DIAGNOSIS — F4310 Post-traumatic stress disorder, unspecified: Secondary | ICD-10-CM | POA: Diagnosis not present

## 2022-08-08 ENCOUNTER — Encounter: Payer: Self-pay | Admitting: Family Medicine

## 2022-08-08 ENCOUNTER — Other Ambulatory Visit: Payer: Self-pay | Admitting: Family Medicine

## 2022-08-08 DIAGNOSIS — M1711 Unilateral primary osteoarthritis, right knee: Secondary | ICD-10-CM

## 2022-08-08 DIAGNOSIS — M1712 Unilateral primary osteoarthritis, left knee: Secondary | ICD-10-CM

## 2022-08-08 DIAGNOSIS — M25462 Effusion, left knee: Secondary | ICD-10-CM

## 2022-08-08 DIAGNOSIS — M7652 Patellar tendinitis, left knee: Secondary | ICD-10-CM

## 2022-08-14 NOTE — Progress Notes (Signed)
HPI female Smoker followed for OSA, complicated by HTN, Pleural Lipoma resected, Loculated R Pleural Effusion,  DM 1/neuropathy, DeQuervain's Tenosynovitis, obesity, depression/history conversion Disorder HST 10/15/20- AHI 26/ hr, desaturation to 78%, body weight 255 lbs HST 06/22/22- AHI 49/ hr, desaturation to 74%, body weight 243 lbs ==============================================================   03/23/21- 47 yo female Smoker followed for OSA, complicated by HTN, Pleural Lipoma resected, Loculated R Pleural Effusion, Tobacco use,  DM 1/neuropathy, DeQuervain's Tenosynovitis, obesity, depression/history conversion Disorder CPAP 5-20/ [employer]   [employer]  ([employer] download) Download compliance 13.3%, AHI 0.9/ hr    Body weight today- 240 lbs Covid vax-4 Phizer Flu vax-had Working as a Production designer, 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  She says she comes home so tired she does falls right into bed without taking time to put CPAP on.  She also admits that she is somewhat afraid at night that she might stop breathing.  I discussed the safety and role of CPAP and trying to help her with these issues.  Compliance goals were discussed.  She has lost 15 pounds since her original sleep study 2 years ago.  We are going to update home sleep test  08/17/22- 47 yo female former Smoker followed for OSA, complicated by HTN, Pleural Lipoma resected, Loculated R Pleural Effusion, Tobacco use,  DM 1/neuropathy, DeQuervain's Tenosynovitis, obesity, depression/history conversion Disorder HST 06/22/22- AHI 49/ hr, desaturation to 74%, body weight 243 lbs CPAP 5-20/ [employer]   [employer]  ([employer] download) Download compliance 33%, AHI 0.7/ hr     avg 7-9 Body weight today-243 lbs Download reviewed. Compliance goals and comfort issues addressed. Using nasal pillows, she says her nose gets congested and she can't breathe. We will reduce pressure.   Question need for PFT.  ROS-see HPI   + =  positive Constitutional:    weight loss, night sweats, fevers, chills, +fatigue, lassitude. HEENT:    headaches, difficulty swallowing, tooth/dental problems, sore throat,       sneezing, itching, ear ache, nasal congestion, post nasal drip, snoring CV:    chest pain, orthopnea, PND, swelling in lower extremities, anasarca,                                   dizziness, palpitations Resp:  + shortness of breath with exertion or at rest.                productive cough,   non-productive cough, coughing up of blood.              change in color of mucus.  wheezing.   Skin:    rash or lesions. GI:  No-   heartburn, indigestion, abdominal pain, nausea, vomiting, diarrhea,                 change in bowel habits, loss of appetite GU: dysuria, change in color of urine, no urgency or frequency.   flank pain. MS:   joint pain, stiffness, decreased range of motion, back pain. Neuro-     nothing unusual Psych:  change in mood or affect. + depression or anxiety.   memory loss.  OBJ- Physical Exam General- Alert, Oriented, Affect-appropriate, Distress- none acute, + obese, + looks tired Skin- rash-none, lesions- none, excoriation- none Lymphadenopathy- none Head- atraumatic            Eyes- Gross vision intact, PERRLA, conjunctivae and secretions clear  Ears- Hearing, canals-normal            Nose- Clear, no-Septal dev, mucus, polyps, erosion, perforation             Throat- Mallampati IV , mucosa clear , drainage- none, tonsils- atrophic, + teeth Neck- flexible , trachea midline, no stridor , thyroid nl, carotid no bruit Chest - symmetrical excursion , unlabored           Heart/CV- RRR , no murmur , no gallop  , no rub, nl s1 s2                           - JVD- none , edema- none, stasis changes- none, varices- none           Lung- clear to P&A, wheeze- none, cough- none , dullness-none, rub- none           Chest wall-  Abd-  Br/ Gen/ Rectal- Not done, not indicated Extrem- cyanosis-  none, clubbing, none, atrophy- none, strength- nl Neuro- grossly intact to observation

## 2022-08-17 ENCOUNTER — Ambulatory Visit: Payer: MEDICAID | Admitting: Internal Medicine

## 2022-08-17 ENCOUNTER — Encounter: Payer: Self-pay | Admitting: Internal Medicine

## 2022-08-17 VITALS — BP 124/66 | HR 87 | Temp 98.8°F | Ht 67.0 in | Wt 243.0 lb

## 2022-08-17 DIAGNOSIS — G4733 Obstructive sleep apnea (adult) (pediatric): Secondary | ICD-10-CM | POA: Diagnosis not present

## 2022-08-17 NOTE — Patient Instructions (Signed)
Order- DME [employer]- please reduce autopap to 4-10            Please refit mask of choice for comfort- try full face mask  Please call if we can help

## 2022-08-21 DIAGNOSIS — E1065 Type 1 diabetes mellitus with hyperglycemia: Secondary | ICD-10-CM | POA: Diagnosis not present

## 2022-08-26 ENCOUNTER — Other Ambulatory Visit: Payer: Self-pay

## 2022-08-26 ENCOUNTER — Ambulatory Visit: Payer: MEDICAID | Admitting: Family Medicine

## 2022-08-26 VITALS — BP 126/82 | Ht 67.0 in | Wt 244.0 lb

## 2022-08-26 DIAGNOSIS — M25462 Effusion, left knee: Secondary | ICD-10-CM | POA: Diagnosis not present

## 2022-08-26 DIAGNOSIS — Z794 Long term (current) use of insulin: Secondary | ICD-10-CM | POA: Diagnosis not present

## 2022-08-26 DIAGNOSIS — M1712 Unilateral primary osteoarthritis, left knee: Secondary | ICD-10-CM | POA: Diagnosis not present

## 2022-08-26 DIAGNOSIS — E1042 Type 1 diabetes mellitus with diabetic polyneuropathy: Secondary | ICD-10-CM | POA: Diagnosis not present

## 2022-08-26 DIAGNOSIS — E1065 Type 1 diabetes mellitus with hyperglycemia: Secondary | ICD-10-CM | POA: Diagnosis not present

## 2022-08-26 DIAGNOSIS — Z8639 Personal history of other endocrine, nutritional and metabolic disease: Secondary | ICD-10-CM | POA: Diagnosis not present

## 2022-08-26 HISTORY — DX: Effusion, left knee: M25.462

## 2022-08-26 MED ORDER — METHYLPREDNISOLONE ACETATE 40 MG/ML IJ SUSP
40.0000 mg | Freq: Once | INTRAMUSCULAR | Status: AC
  Administered 2022-08-26: 40 mg via INTRA_ARTICULAR

## 2022-08-26 NOTE — Progress Notes (Signed)
PCP: Doreene Eland, MD  SUBJECTIVE:   HPI:  Patient is a 47 y.o. female here with chief complaint of left knee effusion.  She reports that she has been increasing her exercise since November of last year though in January started noting some bilateral knee pain with an effusion on the left.  She noted that it would come and go over the past few months though over the past month has been fairly consistent in both sides and pain.  She denies any known injury to the knee.  She has tried Voltaren gel without improvement though does state that oral ibuprofen has been helpful.  Pain ranges from a 7 to a 10 out of 10, currently a 9 out of 10 in severity.  Has not been to the gym for the past few days so swelling is somewhat lower than previous.  Her primary care doctor got MRIs of her knees b/l which showed normal right knee, however left knee with large effusion and medial compartment OA and degenerative changes to medial meniscus. Ligaments were intact.  ROS:     See HPI. No fevers/chill/erythema of either LE.  PERTINENT  PMH / PSH FH / / SH:  Past Medical, Surgical, Social, and Family History Reviewed & Updated in the EMR.  Pertinent findings include:  T1DM Diabetic Neuropathy DKA  No Known Allergies   OBJECTIVE:  BP 126/82   Ht 5\' 7"  (1.702 m)   Wt 244 lb (110.7 kg)   BMI 38.22 kg/m   PHYSICAL EXAM:  GEN: Alert and Oriented, NAD, comfortable in exam room RESP: Unlabored respirations, symmetric chest rise PSY: normal mood, congruent affect   MSK EXAM: Knee, Left: Inspection: +Effusion. No erythema, ecchymosis, deformity. Palpation: +asymmetric warmth. TTP along medial and lateral joint line (M>L) and posterior knee. TTP at pes anserine. No TTP of quad tendon, patella, tibial tuberosity, distal hamstrings, crepitus with flexion/extension. ROM: 0 deg extension, 140 deg flexion, normal hamstring flexibility Strength: 5/5 flexion and extension Special Tests:  Patellar  Grind/Clarke's: Negative Patellar Glide: Appropriate medial/lateral glide negative w/o apprehension ACL: Negative Anterior Drawer/Lachman PCL: Negative Posterior Drawer, Negative Sag Sign MCL/LCL: Stable and Painless with Valgus/Varus stress at 0/15d Meniscus: Positive Thessaly and McMurray medially    ASSESSMENT & PLAN:  1. Primary osteoarthritis of left knee 2. Effusion of left knee History, exam, and recent MRI results consistent with flared osteoarthritis in the left knee resulting in moderate left knee effusion.  She has tried conservative management and is currently getting physical therapy without significant improvement in her pain and swelling of the left knee.  Given the extent to which her knee pain and swelling are limiting her functionality we discussed consideration of knee aspiration and corticosteroid injection which she is amenable to today. This was done under ultrasound guidance as discussed below. She does have T1DM with insulin pump in place and discussed that CSI may lead to a bump in her blood sugar for up to a week. Advised she monitor this and modify her insulin pump accordingly and she expressed understanding. Follow-up on an as-needed basis moving forward and pending response to corticosteroid injection can consider this in the future. - [place] LIMITED JOINT SPACE STRUCTURES LOW LEFT; Future  Left Knee Aspiration/Injection Procedure: After informed written consent timeout was performed, patient was lying supine on exam table.  Left knee was prepped with alcohol swab x2.  Utilizing superolateral approach with ultrasound guidance, 3 mL of lidocaine was used for local anesthesia.  Then using an  18g needle on 60cc syringe, 5 mL of clear straw-colored fluid was aspirated from the left knee.  Knee was then injected with 3:1 lidocaine:depomedrol.  Patient tolerated procedure well without immediate complications.  Glean Salen, MD PGY-4, Sports Medicine Fellow [facility]

## 2022-08-31 DIAGNOSIS — F4310 Post-traumatic stress disorder, unspecified: Secondary | ICD-10-CM | POA: Diagnosis not present

## 2022-09-02 ENCOUNTER — Ambulatory Visit: Payer: MEDICAID | Attending: Family Medicine

## 2022-09-02 DIAGNOSIS — M25562 Pain in left knee: Secondary | ICD-10-CM | POA: Diagnosis present

## 2022-09-02 DIAGNOSIS — M170 Bilateral primary osteoarthritis of knee: Secondary | ICD-10-CM | POA: Diagnosis not present

## 2022-09-02 DIAGNOSIS — M6281 Muscle weakness (generalized): Secondary | ICD-10-CM | POA: Insufficient documentation

## 2022-09-02 DIAGNOSIS — G8929 Other chronic pain: Secondary | ICD-10-CM | POA: Diagnosis present

## 2022-09-02 DIAGNOSIS — R262 Difficulty in walking, not elsewhere classified: Secondary | ICD-10-CM | POA: Diagnosis present

## 2022-09-02 NOTE — Therapy (Signed)
OUTPATIENT PHYSICAL THERAPY LOWER EXTREMITY EVALUATION   Patient Name: Barbara Clark MRN: [id number] DOB:1975/11/02, 47 y.o., female Today's Date: 09/03/2022  END OF SESSION:  PT End of Session - 09/03/22 0600     Visit Number 1    Number of Visits 13    Date for PT Re-Evaluation 10/23/22    Authorization Type [employer]    PT Start Time 0720    PT Stop Time 0803    PT Time Calculation (min) 43 min    Activity Tolerance Patient tolerated treatment well    Behavior During Therapy [facility] for tasks assessed/performed             Past Medical History:  Diagnosis Date   Anxiety    Chronic leg pain 04/27/2014   left knee   Depression    Diabetes mellitus Type 1    Diabetic neuropathy (HCC)    hands and feet   DKA (diabetic ketoacidosis) (HCC) 09/03/2017   HSV-2 (herpes simplex virus 2) infection 2012   serology   Menorrhagia 04/27/2014   PONV (postoperative nausea and vomiting)    Sleep apnea 10/15/2020   Wears glasses    Past Surgical History:  Procedure Laterality Date   DENTAL RESTORATION/EXTRACTION WITH X-RAY Left 11/06/2020   EXAM UNDER ANESTHESIA WITH MANIPULATION OF SHOULDER  03/2020   EYE SURGERY Bilateral 2021   cataracts   HYSTEROSCOPY WITH D & C N/A 05/12/2022   Procedure: HYSTEROSCOPIC IUD Removal;  Surgeon: Warden Fillers, MD;  Location: [facility];  Service: Gynecology;  Laterality: N/A;   INTERCOSTAL NERVE BLOCK Right 11/28/2020   Procedure: INTERCOSTAL NERVE BLOCK;  Surgeon: Loreli Slot, MD;  Location: [facility] OR;  Service: Thoracic;  Laterality: Right;   TUBAL LIGATION  2004   Patient Active Problem List   Diagnosis Date Noted   Effusion of left knee 08/26/2022   Ankle swelling 07/14/2022   Intramural and subserous leiomyoma of uterus 05/12/2022   Malpositioned IUD, subsequent encounter 05/12/2022   Second degree burn of right forearm 03/23/2022   Abnormal uterine bleeding 03/16/2022   Mass of right chest wall  11/28/2020   OSA (obstructive sleep apnea) 10/15/2020   Right rotator cuff tear 05/10/2020   Pleural mass 05/10/2020   Cataract 09/26/2019   De Quervain's tenosynovitis, bilateral 12/20/2018   Carpal tunnel syndrome on right 11/01/2018   Obesity (BMI 30-39.9) 10/12/2017   Back pain, lumbosacral 07/06/2017   Psychologic conversion disorder, 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   Knee pain 04/27/2014   DM neuropathy, type I diabetes mellitus (HCC) 03/13/2014   Diabetes mellitus type I (HCC) 03/18/2006   Major depressive disorder, recurrent episode (HCC) 03/18/2006    PCP: Doreene Eland, MD   REFERRING PROVIDER: Doreene Eland, MD   REFERRING DIAG: M17.0 (ICD-10-CM) - Primary osteoarthritis of both knees  THERAPY DIAG:  Chronic pain of left knee - Plan: PT plan of care cert/re-cert  Muscle weakness (generalized) - Plan: PT plan of care cert/re-cert  Difficulty in walking, not elsewhere classified - Plan: PT plan of care cert/re-cert  Rationale for Evaluation and Treatment: Rehabilitation  ONSET DATE: Nov 2023 on and off  SUBJECTIVE:   SUBJECTIVE STATEMENT: Pt reports a chronic Hx of L knee pain on/off since Nov 2023. Pt thinks she was going to the gym too much 6-7x per week. A week ago the L knee was drained and she received an injection. It now feels better, but still hurts at times, and develop swelling and  pain with prolonged walking and standing.  PERTINENT HISTORY: High BMI, DM 1, diabetic neuropathy  PAIN:  Are you having pain? Yes: NPRS scale: 0/10 Pain location: L knee, medial Pain description: sharp Aggravating factors: Prolonged standing and walking Relieving factors: Rest and elevation On eval pain range 0-10/10  PRECAUTIONS: None  RED FLAGS: None   WEIGHT BEARING RESTRICTIONS: No  FALLS:  Has patient fallen in last 6 months? 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 Large joint effusion with synovitis. 2. Mild partial-thickness cartilage loss of the medial tibiofemoral compartment. 3. Degenerative changes of the medial meniscus without discrete tear. 4. Cruciate and collateral ligaments are intact. 5. No evidence of fracture or osteonecrosis. 6. Tendinosis of the patellar tendon without tear.  PATIENT SURVEYS:  FOTO: Perceived function   53%, predicted   64%   COGNITION: Overall cognitive status: Within functional limits for tasks assessed     SENSATION: WFL  EDEMA:  Swelling of L knee present (not measured c pt pesenting in jeans)  MUSCLE LENGTH: Hamstrings: Right WNLs deg; Left WNLs deg Maisie Fus test: Right NT deg; Left NT deg  POSTURE: increased thoracic kyphosis  PALPATION: TTP to the medial joint space and joint line  LOWER EXTREMITY ROM:  Active ROM Right eval Left eval  Hip flexion    Hip extension    Hip abduction    Hip adduction    Hip internal rotation    Hip external rotation    Knee flexion WNLs WNLs  Knee extension WNLs WNLs  Ankle dorsiflexion    Ankle plantarflexion    Ankle inversion    Ankle eversion     (Blank rows = not tested)  LOWER EXTREMITY MMT:  MMT Right eval Left eval  Hip flexion 5 4  Hip extension 5 4  Hip abduction 5 4  Hip adduction 5 5  Hip internal rotation 5 5  Hip external rotation 5 4  Knee flexion 5 5  Knee extension 5 5  Ankle dorsiflexion    Ankle plantarflexion    Ankle inversion    Ankle eversion     (Blank rows = not tested)  LOWER EXTREMITY SPECIAL TESTS:  Knee special tests: Anterior drawer test: negative, Posterior drawer test: negative, McMurray's test: negative, Patellafemoral apprehension test: negative, Patellafemoral grind test: negative, and Patella tap  test (ballotable patella): negative  FUNCTIONAL TESTS:  14.8  s use of armrests  GAIT: Distance walked: 200" Assistive device utilized: None Level of assistance: Complete Independence Comments: Gait pattern presents WNLs on eval with pt reporting no pain at time of eval   TODAY'S TREATMENT:   [facility] Adult PT Treatment:                                                DATE: 09/02/22 Therapeutic Exercise: Developed, instructed in, and pt completed therex as noted in HEP  Self Care: RICE for symptom management  PATIENT EDUCATION:  Education details: Eval findings, POC, HEP, self care  Person educated: Patient Education method: Explanation, Demonstration, Tactile cues, Verbal cues, and Handouts Education comprehension: verbalized understanding, returned demonstration, verbal cues required, tactile cues required, and needs further education  HOME EXERCISE PROGRAM: Access Code: [facility] URL: [email or web address] Date: 09/02/2022 Prepared by: Joellyn Rued  Exercises - Active Straight Leg Raise with Quad Set  - 1 x daily - 7 x weekly - 3 sets - 10 reps - 3 hold - Sidelying Hip Abduction  - 1 x daily - 7 x weekly - 3 sets - 10 reps - 3 hold - Clamshell with Resistance  - 1 x daily - 7 x weekly - 3 sets - 10 reps - 3 hold - Bridge with Arms at [employer] and Feet on [employer]  - 1 x daily - 7 x weekly - 3 sets - 10 reps - 3 hold  ASSESSMENT:  CLINICAL IMPRESSION: Patient is a 47 y.o. female  who was seen today for physical therapy evaluation and treatment for M17.0 (ICD-10-CM) - Primary osteoarthritis of both knees. Pt presents with intermittent L knee pain ranging from 0-10/10. Pt has degenerative changes of the L knee as noted above in her MRI, L knee swelling is present, and the L hip is weaker than the R. A HEP was initiated. Pt will benefit from skilled PT  1-2x per week to address impairments to optimize function of the L knee with less pain.   OBJECTIVE IMPAIRMENTS: decreased activity tolerance, difficulty walking, decreased strength, increased edema, obesity, and pain.   ACTIVITY LIMITATIONS: bending, sitting, standing, squatting, stairs, and locomotion level  PARTICIPATION LIMITATIONS: meal prep, cleaning, laundry, shopping, and occupation  PERSONAL FACTORS: High BMI, DM 1, diabetic neuropathy are also affecting patient's functional outcome.   REHAB POTENTIAL: Good  CLINICAL DECISION MAKING: Stable/uncomplicated  EVALUATION COMPLEXITY: Low   GOALS:  SHORT TERM GOALS: Target date: 09/25/22  Pt will be Ind in an initial HEP  Baseline: started Goal status: INITIAL  2.  Pt will voice understanding of measures to assist in pain reduction  Baseline: started Goal status: INITIAL  LONG TERM GOALS: Target date: 10/23/22  Pt will be Ind in a final HEP to maintain achieved LOF  Baseline: started Goal status: INITIAL  2.  Increase L hip strength to 4+ or greater for improved L knee/LE function Baseline: 4/5 Goal status: INITIAL  3.  Pt will report a 50% or greater improvement in L knee pain for improved function and QOL Baseline:  Goal status: INITIAL  4.  Improve 5xSTS to 11" or less as indication of improved functional mobility  Baseline: 14.8 sec s use of armrests Goal status: INITIAL  5.  Pt's FOTO score will improved to the predicted value of 64% as indication of improved function  Baseline: 53% Goal status: INITIAL  PLAN:  PT FREQUENCY:  1 to 2x/week  PT DURATION: 6 weeks  PLANNED INTERVENTIONS: Therapeutic exercises, Therapeutic activity, Neuromuscular re-education, Balance training, Gait training, Patient/Family education, Self Care, Joint mobilization, Aquatic Therapy, Dry Needling, Electrical stimulation, Cryotherapy, Moist heat, Taping, Vasopneumatic device, Ultrasound, Ionotophoresis 4mg /ml Dexamethasone, Manual  therapy, and Re-evaluation  PLAN FOR NEXT SESSION: Review FOTO; assess response to HEP; progress therex as indicated; use of modalities, manual therapy; TPDN as indicated; consider an unloading brace.      MS, PT 09/03/22 1:49 PM  Check all possible CPT codes: 43329 - PT Re-evaluation, 97110- Therapeutic Exercise, [phone]- Neuro Re-education, [phone] - Gait Training, [phone] -  Manual Therapy, 97530 - Therapeutic Activities, [phone] - Self Care, 60454 - Electrical stimulation (Manual), [id number] - Ultrasound, and [id number] - Aquatic therapy    Check all conditions that are expected to impact treatment: {Conditions expected to impact treatment:Morbid obesity and Diabetes mellitus   If treatment provided at initial evaluation, no treatment charged due to lack of authorization.

## 2022-09-04 NOTE — Assessment & Plan Note (Signed)
Severe OSA, Treatment options and safety issues addressed. Plan- reduce autopap to 4-10, seeking better tolerance.

## 2022-09-08 ENCOUNTER — Ambulatory Visit
Admission: RE | Admit: 2022-09-08 | Discharge: 2022-09-08 | Disposition: A | Discharge status: Home / Self Care | Payer: MEDICAID | Source: Ambulatory Visit | Attending: Thoracic Surgery (Cardiothoracic Vascular Surgery) | Admitting: Thoracic Surgery (Cardiothoracic Vascular Surgery)

## 2022-09-08 ENCOUNTER — Ambulatory Visit: Payer: MEDICAID | Admitting: Thoracic Surgery (Cardiothoracic Vascular Surgery)

## 2022-09-08 VITALS — BP 129/70 | HR 80 | Resp 20 | Ht 67.0 in | Wt 243.0 lb

## 2022-09-08 DIAGNOSIS — D171 Benign lipomatous neoplasm of skin and subcutaneous tissue of trunk: Secondary | ICD-10-CM | POA: Diagnosis not present

## 2022-09-08 DIAGNOSIS — R222 Localized swelling, mass and lump, trunk: Secondary | ICD-10-CM

## 2022-09-08 NOTE — Progress Notes (Addendum)
[address]       [place] [address]             [phone]    This visit was conducted as a telephone visit at the patient's request.  She had to leave the office to get to work earlier today.  Patient location: Work MD location: Office  HPI: Barbara Clark is a 47 year old woman with a past medical history significant for type 1 diabetes with neuropathy, hypertension, anxiety, depression, sleep apnea, and a chest wall lipoma.  She had chest wall lipoma that required a combined intra and extrapleural approach for resection in November 2022.  She had paresthesias and numbness initially and was treated with Lyrica 25 mg twice daily.  I last saw her in July 2023.  She still has some numbness but was not having any pain and she had discontinued Lyrica.  In the interim since her last visit she has been doing well.  She does not have any significant issues related to her surgery.  She has been having some knee pain and had an effusion.  Past Medical History:  Diagnosis Date   Anxiety    Chronic leg pain 04/27/2014   left knee   Depression    Diabetes mellitus Type 1    Diabetic neuropathy (HCC)    hands and feet   DKA (diabetic ketoacidosis) (HCC) 09/03/2017   HSV-2 (herpes simplex virus 2) infection 2012   serology   Menorrhagia 04/27/2014   PONV (postoperative nausea and vomiting)    Sleep apnea 10/15/2020   Wears glasses      Current Outpatient Medications  Medication Sig Dispense Refill   ACCU-CHEK GUIDE test strip      atorvastatin (LIPITOR) 10 MG tablet TAKE 1 TABLET BY MOUTH EVERYDAY AT BEDTIME 90 tablet 1   diclofenac Sodium (VOLTAREN) 1 % GEL Rub into affected area of knee 4 times daily prn 100 g 2   DULoxetine (CYMBALTA) 60 MG capsule Take 60 mg by mouth daily.     ibuprofen (ADVIL) 400 MG tablet Take 1 tablet (400 mg total) by mouth every 8 (eight) hours as needed for moderate pain. 90 tablet 0   insulin aspart (NOVOLOG) 100 UNIT/ML injection  Inject 0-100 Units into the skin continuous. Via pump     insulin glargine (LANTUS) 100 UNIT/ML injection Inject 45 Units into the skin daily. Back up if insulin pump not working     Insulin Human (INSULIN PUMP) SOLN Inject into the skin. Patient uses novolog 1.6 units/hr in insulin pump.     losartan (COZAAR) 25 MG tablet Take 1 tablet (25 mg total) by mouth daily. 90 tablet 1   megestrol (MEGACE) 40 MG tablet Take 1 tablet (40 mg total) by mouth 2 (two) times daily. Can increase to two tablets twice a day in the event of heavy bleeding 60 tablet 5   No current facility-administered medications for this visit.    Physical Exam BP 129/70   Pulse 80   Resp 20   Ht 5\' 7"  (1.702 m)   Wt 243 lb (110.2 kg)   LMP 09/01/2022 (Approximate)   SpO2 97% Comment: RA  BMI 38.52 kg/m  47 year old woman in no acute distress Alert and oriented x 3 No exam due to telephone visit  Diagnostic Tests: CHEST - 2 VIEW   COMPARISON:  07/29/2021   FINDINGS: Cardiac silhouette is unremarkable. No pneumothorax or pleural effusion. The lungs are clear. Chronic right upper  lateral rib deformities. Osseous structures are otherwise intact.   IMPRESSION: No acute cardiopulmonary process.     Electronically Signed   By: Layla Maw M.D.   On: 09/08/2022 14:53 I personally reviewed the chest x-ray images.  Stable upper lateral rib deformity.  Impression: Barbara Clark is a 47 year old woman with a past medical history significant for type 1 diabetes with neuropathy, hypertension, anxiety, depression, sleep apnea, and a chest wall lipoma.    Chest wall lipoma-benign and complete resection grossly although there was lipoma present at inked margins on the specimen.  No evidence of recurrence.  Given the benign appearance pathology I do not think there is any need to do CT scan at this point but may consider one in the future.  No significant issues related to prior surgery.  Plan: Return in 1 year  with PA and lateral chest x-ray  I spent 7 minutes in review of records, images, and in consultation by telephone with Barbara Clark today. Barbara Slot, MD [facility] [phone]  4 minutes on telephone  Salvatore Decent. Dorris Fetch, MD [facility] [phone]

## 2022-09-11 ENCOUNTER — Ambulatory Visit: Payer: MEDICAID | Admitting: Physical Therapy

## 2022-09-11 ENCOUNTER — Encounter: Payer: Self-pay | Admitting: Physical Therapy

## 2022-09-11 DIAGNOSIS — G8929 Other chronic pain: Secondary | ICD-10-CM

## 2022-09-11 DIAGNOSIS — M25562 Pain in left knee: Secondary | ICD-10-CM | POA: Diagnosis not present

## 2022-09-11 DIAGNOSIS — R262 Difficulty in walking, not elsewhere classified: Secondary | ICD-10-CM

## 2022-09-11 DIAGNOSIS — M6281 Muscle weakness (generalized): Secondary | ICD-10-CM

## 2022-09-11 NOTE — Therapy (Signed)
OUTPATIENT PHYSICAL THERAPY LOWER EXTREMITY TREATMENT   Patient Name: Barbara Clark MRN: [id number] DOB:Feb 04, 1975, 47 y.o., female Today's Date: 09/11/2022  END OF SESSION:  PT End of Session - 09/11/22 0723     Visit Number 2    Number of Visits 13    Date for PT Re-Evaluation 10/23/22    Authorization Type [employer]    PT Start Time 0720    PT Stop Time 0808    PT Time Calculation (min) 48 min             Past Medical History:  Diagnosis Date   Anxiety    Chronic leg pain 04/27/2014   left knee   Depression    Diabetes mellitus Type 1    Diabetic neuropathy (HCC)    hands and feet   DKA (diabetic ketoacidosis) (HCC) 09/03/2017   HSV-2 (herpes simplex virus 2) infection 2012   serology   Menorrhagia 04/27/2014   PONV (postoperative nausea and vomiting)    Sleep apnea 10/15/2020   Wears glasses    Past Surgical History:  Procedure Laterality Date   DENTAL RESTORATION/EXTRACTION WITH X-RAY Left 11/06/2020   EXAM UNDER ANESTHESIA WITH MANIPULATION OF SHOULDER  03/2020   EYE SURGERY Bilateral 2021   cataracts   HYSTEROSCOPY WITH D & C N/A 05/12/2022   Procedure: HYSTEROSCOPIC IUD Removal;  Surgeon: Warden Fillers, MD;  [facility]: [facility];  Service: Gynecology;  Laterality: N/A;   INTERCOSTAL NERVE BLOCK Right 11/28/2020   Procedure: INTERCOSTAL NERVE BLOCK;  Surgeon: Loreli Slot, MD;  Location: [facility] OR;  Service: Thoracic;  Laterality: Right;   TUBAL LIGATION  2004   Patient Active Problem List   Diagnosis Date Noted   Effusion of left knee 08/26/2022   Ankle swelling 07/14/2022   Intramural and subserous leiomyoma of uterus 05/12/2022   Malpositioned IUD, subsequent encounter 05/12/2022   Second degree burn of right forearm 03/23/2022   Abnormal uterine bleeding 03/16/2022   Mass of right chest wall 11/28/2020   OSA (obstructive sleep apnea) 10/15/2020   Right rotator cuff tear 05/10/2020   Pleural mass 05/10/2020    Cataract 09/26/2019   De Quervain's tenosynovitis, bilateral 12/20/2018   Carpal tunnel syndrome on right 11/01/2018   Obesity (BMI 30-39.9) 10/12/2017   Back pain, lumbosacral 07/06/2017   Psychologic conversion disorder, 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   Knee pain 04/27/2014   DM neuropathy, type I diabetes mellitus (HCC) 03/13/2014   Diabetes mellitus type I (HCC) 03/18/2006   Major depressive disorder, recurrent episode (HCC) 03/18/2006    PCP: Doreene Eland, MD   REFERRING PROVIDER: Doreene Eland, MD   REFERRING DIAG: M17.0 (ICD-10-CM) - Primary osteoarthritis of both knees  THERAPY DIAG:  Chronic pain of left knee  Muscle weakness (generalized)  Difficulty in walking, not elsewhere classified  Rationale for Evaluation and Treatment: Rehabilitation  ONSET DATE: Nov 2023 on and off  SUBJECTIVE:   SUBJECTIVE STATEMENT: Pt reports no pain today however yesterday she was in pain. The pain comes and goes with no specific aggravator and increases with standing and walking if she is already in pain.   EVAL: Pt reports a chronic Hx of L knee pain on/off since Nov 2023. Pt thinks she was going to the gym too much 6-7x per week. A week ago the L knee was drained and she received an injection. It now feels better, but still hurts at times, and develop swelling and pain with prolonged walking and  standing.  PERTINENT HISTORY: High BMI, DM 1, diabetic neuropathy  PAIN:  Are you having pain? Yes: NPRS scale: 0/10 Pain location: L knee, medial Pain description: sharp Aggravating factors: Prolonged standing and walking Relieving factors: Rest and elevation On eval pain range 0-10/10  PRECAUTIONS: None  RED FLAGS: None   WEIGHT BEARING RESTRICTIONS: No  FALLS:  Has patient fallen in last 6 months? No  LIVING ENVIRONMENT: Lives with: lives with their family Lives in: House/apartment Able too access and be mobile within home  OCCUPATION: Production designer, theatre/television/film of a food chain  store  PLOF: Independent  PATIENT GOALS: Less pain  NEXT MD VISIT: Not at this time  OBJECTIVE:   DIAGNOSTIC FINDINGS: 07/31/22 L knee MRI  IMPRESSION: 1. Large joint effusion with synovitis. 2. Mild partial-thickness cartilage loss of the medial tibiofemoral compartment. 3. Degenerative changes of the medial meniscus without discrete tear. 4. Cruciate and collateral ligaments are intact. 5. No evidence of fracture or osteonecrosis. 6. Tendinosis of the patellar tendon without tear.  PATIENT SURVEYS:  FOTO: Perceived function   53%, predicted   64%   COGNITION: Overall cognitive status: Within functional limits for tasks assessed     SENSATION: WFL  EDEMA:  Swelling of L knee present (not measured c pt pesenting in jeans)  MUSCLE LENGTH: Hamstrings: Right WNLs deg; Left WNLs deg Maisie Fus test: Right NT deg; Left NT deg  POSTURE: increased thoracic kyphosis  PALPATION: TTP to the medial joint space and joint line  LOWER EXTREMITY ROM:  Active ROM Right eval Left eval  Hip flexion    Hip extension    Hip abduction    Hip adduction    Hip internal rotation    Hip external rotation    Knee flexion WNLs WNLs  Knee extension WNLs WNLs  Ankle dorsiflexion    Ankle plantarflexion    Ankle inversion    Ankle eversion     (Blank rows = not tested)  LOWER EXTREMITY MMT:  MMT Right eval Left eval  Hip flexion 5 4  Hip extension 5 4  Hip abduction 5 4  Hip adduction 5 5  Hip internal rotation 5 5  Hip external rotation 5 4  Knee flexion 5 5  Knee extension 5 5  Ankle dorsiflexion    Ankle plantarflexion    Ankle inversion    Ankle eversion     (Blank rows = not tested)  LOWER EXTREMITY SPECIAL TESTS:  Knee special tests: Anterior drawer test: negative, Posterior drawer test: negative, McMurray's test: negative, Patellafemoral apprehension test: negative, Patellafemoral grind test: negative, and Patella tap test (ballotable patella):  negative  FUNCTIONAL TESTS:  14.8  s use of armrests  GAIT: Distance walked: 200" Assistive device utilized: None Level of assistance: Complete Independence Comments: Gait pattern presents WNLs on eval with pt reporting no pain at time of eval   TODAY'S TREATMENT:      [facility] Adult PT Treatment:                                                DATE: 09/11/22 Therapeutic Exercise: Rec Bike L2 x 5 minutes  QS into towel SLR x 10 Hip abdct 10 x 2  Clam side 10 x 2  Supine bridge with feet on ball  Knee flexion with feet on ball  Supine h/s stretch with strap +HEP  Modalities: Ice pack medial knee concurrent with HEP update and education on frequent ice applications to reduce pain and inflammation    [facility] Adult PT Treatment:                                                DATE: 09/02/22 Therapeutic Exercise: Developed, instructed in, and pt completed therex as noted in HEP  Self Care: RICE for symptom management                                                                                                                             PATIENT EDUCATION:  Education details: Eval findings, POC, HEP, self care  Person educated: Patient Education method: Explanation, Demonstration, Tactile cues, Verbal cues, and Handouts Education comprehension: verbalized understanding, returned demonstration, verbal cues required, tactile cues required, and needs further education  HOME EXERCISE PROGRAM: Access Code: [facility] URL: [email or web address] Date: 09/02/2022 Prepared by: Joellyn Rued  Exercises - Active Straight Leg Raise with Quad Set  - 1 x daily - 7 x weekly - 3 sets - 10 reps - 3 hold - Sidelying Hip Abduction  - 1 x daily - 7 x weekly - 3 sets - 10 reps - 3 hold - Clamshell with Resistance  - 1 x daily - 7 x weekly - 3 sets - 10 reps - 3 hold - Bridge with Arms at [employer] and Feet on [employer]  - 1 x daily - 7 x weekly - 3 sets - 10 reps - 3 hold 09/11/22 -  Hooklying Hamstring Stretch with Strap  - 1 x daily - 7 x weekly - 1 sets - 3 reps - 30 hold  ASSESSMENT:  CLINICAL IMPRESSION: Pt reports compliance with HEP and intermittent pain. Reviewed HEP and she demonstrated independence. Pt with increased pain after HEP. Added hamstring stretch. She is tender medial knee, along VMO and pes anserine. Ice applied and end of session to medial knee and encouraged pt to ice more often at home. She may benefit from a round of iontophoresis.   EVAL: Patient is a 47 y.o. female  who was seen today for physical therapy evaluation and treatment for M17.0 (ICD-10-CM) - Primary osteoarthritis of both knees. Pt presents with intermittent L knee pain ranging from 0-10/10. Pt has degenerative changes of the L knee as noted above in her MRI, L knee swelling is present, and the L hip is weaker than the R. A HEP was initiated. Pt will benefit from skilled PT 1-2x per week to address impairments to optimize function of the L knee with less pain.   OBJECTIVE IMPAIRMENTS: decreased activity tolerance, difficulty walking, decreased strength, increased edema, obesity, and pain.   ACTIVITY LIMITATIONS: bending, sitting, standing, squatting, stairs, and locomotion level  PARTICIPATION LIMITATIONS: meal prep, cleaning, laundry, shopping, and occupation  PERSONAL  FACTORS: High BMI, DM 1, diabetic neuropathy are also affecting patient's functional outcome.   REHAB POTENTIAL: Good  CLINICAL DECISION MAKING: Stable/uncomplicated  EVALUATION COMPLEXITY: Low   GOALS:  SHORT TERM GOALS: Target date: 09/25/22  Pt will be Ind in an initial HEP  Baseline: started Goal status: MET 09/11/22  2.  Pt will voice understanding of measures to assist in pain reduction  Baseline: started 09/11/22: education on benefits of ice applications Goal status: ONGOING  LONG TERM GOALS: Target date: 10/23/22  Pt will be Ind in a final HEP to maintain achieved LOF  Baseline: started Goal  status: INITIAL  2.  Increase L hip strength to 4+ or greater for improved L knee/LE function Baseline: 4/5 Goal status: INITIAL  3.  Pt will report a 50% or greater improvement in L knee pain for improved function and QOL Baseline:  Goal status: INITIAL  4.  Improve 5xSTS to 11" or less as indication of improved functional mobility  Baseline: 14.8 sec s use of armrests Goal status: INITIAL  5.  Pt's FOTO score will improved to the predicted value of 64% as indication of improved function  Baseline: 53% Goal status: INITIAL  PLAN:  PT FREQUENCY:  1 to 2x/week  PT DURATION: 6 weeks  PLANNED INTERVENTIONS: Therapeutic exercises, Therapeutic activity, Neuromuscular re-education, Balance training, Gait training, Patient/Family education, Self Care, Joint mobilization, Aquatic Therapy, Dry Needling, Electrical stimulation, Cryotherapy, Moist heat, Taping, Vasopneumatic device, Ultrasound, Ionotophoresis 4mg /ml Dexamethasone, Manual therapy, and Re-evaluation  PLAN FOR NEXT SESSION: Review FOTO; assess response to HEP; progress therex as indicated; use of modalities, manual therapy; TPDN as indicated; consider an unloading brace.    Allen Ralls MS, PT 09/11/22 8:58 AM  Check all possible CPT codes: 16109 - PT Re-evaluation, 97110- Therapeutic Exercise, [phone]- Neuro Re-education, [phone] - Gait Training, [phone] - Manual Therapy, [phone] - Therapeutic Activities, [phone] - Self Care, [phone] - Electrical stimulation (Manual), [id number] - Ultrasound, and [id number] - Aquatic therapy    Check all conditions that are expected to impact treatment: {Conditions expected to impact treatment:Morbid obesity and Diabetes mellitus   If treatment provided at initial evaluation, no treatment charged due to lack of authorization.

## 2022-09-15 ENCOUNTER — Encounter: Payer: Self-pay | Admitting: Internal Medicine

## 2022-09-15 ENCOUNTER — Ambulatory Visit: Payer: MEDICAID | Admitting: Physical Therapy

## 2022-09-16 NOTE — Therapy (Signed)
OUTPATIENT PHYSICAL THERAPY LOWER EXTREMITY TREATMENT   Patient Name: Barbara Clark MRN: [id number] DOB:Feb 16, 1975, 47 y.o., female Today's Date: 09/18/2022  END OF SESSION:  PT End of Session - 09/18/22 0808     Visit Number 3    Number of Visits 13    Date for PT Re-Evaluation 10/23/22    Authorization Type [employer]    PT Start Time 0805    PT Stop Time 0845    PT Time Calculation (min) 40 min    Activity Tolerance Patient tolerated treatment well    Behavior During Therapy [facility] for tasks assessed/performed              Past Medical History:  Diagnosis Date   Anxiety    Chronic leg pain 04/27/2014   left knee   Depression    Diabetes mellitus Type 1    Diabetic neuropathy (HCC)    hands and feet   DKA (diabetic ketoacidosis) (HCC) 09/03/2017   HSV-2 (herpes simplex virus 2) infection 2012   serology   Menorrhagia 04/27/2014   PONV (postoperative nausea and vomiting)    Sleep apnea 10/15/2020   Wears glasses    Past Surgical History:  Procedure Laterality Date   DENTAL RESTORATION/EXTRACTION WITH X-RAY Left 11/06/2020   EXAM UNDER ANESTHESIA WITH MANIPULATION OF SHOULDER  03/2020   EYE SURGERY Bilateral 2021   cataracts   HYSTEROSCOPY WITH D & C N/A 05/12/2022   Procedure: HYSTEROSCOPIC IUD Removal;  Surgeon: Warden Fillers, MD;  Location: [facility] [place];  Service: Gynecology;  Laterality: N/A;   INTERCOSTAL NERVE BLOCK Right 11/28/2020   Procedure: INTERCOSTAL NERVE BLOCK;  Surgeon: Loreli Slot, MD;  Location: [facility] OR;  Service: Thoracic;  Laterality: Right;   TUBAL LIGATION  2004   Patient Active Problem List   Diagnosis Date Noted   Effusion of left knee 08/26/2022   Ankle swelling 07/14/2022   Intramural and subserous leiomyoma of uterus 05/12/2022   Malpositioned IUD, subsequent encounter 05/12/2022   Second degree burn of right forearm 03/23/2022   Abnormal uterine bleeding 03/16/2022   Mass of right chest wall  11/28/2020   OSA (obstructive sleep apnea) 10/15/2020   Right rotator cuff tear 05/10/2020   Pleural mass 05/10/2020   Cataract 09/26/2019   De Quervain's tenosynovitis, bilateral 12/20/2018   Carpal tunnel syndrome on right 11/01/2018   Obesity (BMI 30-39.9) 10/12/2017   Back pain, lumbosacral 07/06/2017   Psychologic conversion disorder, 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 The swelling and pain is now intermittent. Due to heavy work schedule, pt reports limited completion of HEP this week.  EVAL: Pt reports a chronic Hx of L knee pain on/off since Nov 2023. Pt thinks she was going to the gym too much 6-7x per week. A week ago the L knee was drained and she received an injection. It now feels better, but still hurts  at times, and develop swelling and pain with prolonged walking and standing.  PERTINENT HISTORY: High BMI, DM 1, diabetic neuropathy  PAIN:  Are you having pain? Yes: NPRS scale: 0/10 Pain location: L knee, medial Pain description: sharp Aggravating factors: Prolonged standing and walking Relieving factors: Rest and elevation On eval pain range 0-10/10  PRECAUTIONS: None  RED FLAGS: None   WEIGHT BEARING RESTRICTIONS: No  FALLS:  Has patient fallen in last 6 months? No  LIVING ENVIRONMENT: Lives with: lives with their family Lives in: House/apartment Able too  access and be mobile within home  OCCUPATION: Production designer, theatre/television/film of a food chain store  PLOF: Independent  PATIENT GOALS: Less pain  NEXT MD VISIT: Not at this time  OBJECTIVE:   DIAGNOSTIC FINDINGS: 07/31/22 L knee MRI  IMPRESSION: 1. Large joint effusion with synovitis. 2. Mild partial-thickness cartilage loss of the medial tibiofemoral compartment. 3. Degenerative changes of the medial meniscus without discrete tear. 4. Cruciate and collateral ligaments are intact. 5. No evidence of fracture or osteonecrosis. 6. Tendinosis of the patellar tendon without tear.  PATIENT SURVEYS:  FOTO: Perceived function   53%, predicted   64%   COGNITION: Overall cognitive status: Within functional limits for tasks assessed     SENSATION: WFL  EDEMA:  Swelling of L knee present (not measured c pt pesenting in jeans)  MUSCLE LENGTH: Hamstrings: Right WNLs deg; Left WNLs deg Maisie Fus test: Right NT deg; Left NT deg  POSTURE: increased thoracic kyphosis  PALPATION: TTP to the medial joint space and joint line  LOWER EXTREMITY ROM:  Active ROM Right eval Left eval  Hip flexion    Hip extension    Hip abduction    Hip adduction    Hip internal rotation    Hip external rotation    Knee flexion WNLs WNLs  Knee extension WNLs WNLs  Ankle dorsiflexion    Ankle plantarflexion    Ankle inversion    Ankle eversion     (Blank rows = not tested)  LOWER EXTREMITY MMT:  MMT Right eval Left eval  Hip flexion 5 4  Hip extension 5 4  Hip abduction 5 4  Hip adduction 5 5  Hip internal rotation 5 5  Hip external rotation 5 4  Knee flexion 5 5  Knee extension 5 5  Ankle dorsiflexion    Ankle plantarflexion    Ankle inversion    Ankle eversion     (Blank rows = not tested)  LOWER EXTREMITY SPECIAL TESTS:  Knee special tests: Anterior drawer test: negative, Posterior drawer test: negative, McMurray's test: negative, Patellafemoral apprehension test: negative, Patellafemoral grind test:  negative, and Patella tap test (ballotable patella): negative  FUNCTIONAL TESTS:  14.8  s use of armrests  GAIT: Distance walked: 200" Assistive device utilized: None Level of assistance: Complete Independence Comments: Gait pattern presents WNLs on eval with pt reporting no pain at time of eval   TODAY'S TREATMENT:   [facility] Adult PT Treatment:                                                DATE: 09/18/22 Therapeutic Exercise: NuStep L4 x 5 minutes UE/LE QS into towel x10 5" in ER SLR c QSx 10 in ER Hip abdct 10 x 2  Clam side 10 x 2  Supine bridge with feet on ball  Knee flexion with feet on ball  Supine h/s stretch with strap  Modalities: Ice pack medial knee concurrent with HEP update and education on frequent ice applications to reduce pain and inflammation  [facility] Adult PT Treatment:                                                DATE: 09/11/22 Therapeutic Exercise: Rec Bike L2 x 5 minutes  QS into towel SLR x 10 Hip abdct 10 x 2  Clam side 10 x 2  Supine bridge with feet on ball x8, pt experienced l knee pain and ex was stopped Knee flexion with feet on ball x15 Seated h/s stretch was tolerated better than with strap  [facility] Adult PT Treatment:                                                DATE: 09/02/22 Therapeutic Exercise: Developed, instructed in, and pt completed therex as noted in HEP  Self Care: RICE for symptom management                                                                                                                             PATIENT EDUCATION:  Education details: Eval findings, POC, HEP, self care  Person educated: Patient Education method: Explanation, Demonstration, Tactile cues, Verbal cues, and Handouts Education comprehension: verbalized understanding, returned demonstration, verbal cues required, tactile cues required, and needs further education  HOME EXERCISE PROGRAM: Access Code: [facility] URL:  [email or web address] Date: 09/02/2022 Prepared by: Joellyn Rued  Exercises - Active Straight Leg Raise with Quad Set  - 1 x daily - 7 x weekly - 3 sets - 10 reps - 3 hold - Sidelying Hip Abduction  - 1 x daily - 7 x weekly - 3 sets - 10 reps - 3 hold - Clamshell with Resistance  - 1 x daily - 7 x weekly - 3 sets - 10 reps - 3 hold - Bridge with Arms at [employer] and Feet on [employer]  - 1 x daily - 7 x weekly - 3 sets - 10 reps - 3 hold 09/11/22 - Hooklying Hamstring Stretch with Strap  - 1 x daily - 7 x weekly - 1 sets - 3 reps - 30 hold  ASSESSMENT:  CLINICAL IMPRESSION: Pt appears to responding positively to PT with report of her knee pain being more intermittent. PT was completed for knee/LE strengthening and flexibility. Pt reports she is using a cold pack at night to help manage th pain and swelling. The pt is making appropriate progress re: L knee pain and meeting goals to date. Pt experienced increased L knee pain over the PT session, but due  to needing to get to work, pt is going to use a ice pack there for pain and swelling. Pt will continue to benefit from skilled PT to address impairments for improved function with less pain. Pt may benefit from iontophoresis.   EVAL: Patient is a 47 y.o. female  who was seen today for physical therapy evaluation and treatment for M17.0 (ICD-10-CM) - Primary osteoarthritis of both knees. Pt presents with intermittent L knee pain ranging from 0-10/10. Pt has degenerative changes of the L knee as noted above in her MRI, L knee swelling is present, and the L hip is weaker than the R. A HEP was initiated. Pt will benefit from skilled PT 1-2x per week to address impairments to optimize function of the L knee with less pain.   OBJECTIVE IMPAIRMENTS: decreased activity tolerance, difficulty walking, decreased strength, increased edema, obesity, and pain.   ACTIVITY LIMITATIONS: bending, sitting, standing, squatting, stairs, and locomotion  level  PARTICIPATION LIMITATIONS: meal prep, cleaning, laundry, shopping, and occupation  PERSONAL FACTORS: High BMI, DM 1, diabetic neuropathy are also affecting patient's functional outcome.   REHAB POTENTIAL: Good  CLINICAL DECISION MAKING: Stable/uncomplicated  EVALUATION COMPLEXITY: Low   GOALS:  SHORT TERM GOALS: Target date: 09/25/22  Pt will be Ind in an initial HEP  Baseline: started Goal status: MET 09/11/22  2.  Pt will voice understanding of measures to assist in pain reduction  Baseline: started 09/11/22: education on benefits of ice applications Goal status: MET 09/18/22  LONG TERM GOALS: Target date: 10/23/22  Pt will be Ind in a final HEP to maintain achieved LOF  Baseline: started Goal status: INITIAL  2.  Increase L hip strength to 4+ or greater for improved L knee/LE function Baseline: 4/5 Goal status: INITIAL  3.  Pt will report a 50% or greater improvement in L knee pain for improved function and QOL Baseline:  Goal status: INITIAL  4.  Improve 5xSTS to 11" or less as indication of improved functional mobility  Baseline: 14.8 sec s use of armrests Goal status: INITIAL  5.  Pt's FOTO score will improved to the predicted value of 64% as indication of improved function  Baseline: 53% Goal status: INITIAL  PLAN:  PT FREQUENCY:  1 to 2x/week  PT DURATION: 6 weeks  PLANNED INTERVENTIONS: Therapeutic exercises, Therapeutic activity, Neuromuscular re-education, Balance training, Gait training, Patient/Family education, Self Care, Joint mobilization, Aquatic Therapy, Dry Needling, Electrical stimulation, Cryotherapy, Moist heat, Taping, Vasopneumatic device, Ultrasound, Ionotophoresis 4mg /ml Dexamethasone, Manual therapy, and Re-evaluation  PLAN FOR NEXT SESSION: Review FOTO; assess response to HEP; progress therex as indicated; use of modalities, manual therapy; TPDN as indicated; consider an unloading brace.    Dalayna Lauter MS, PT 09/18/22 8:48  AM  Check all possible CPT codes: 54098 - PT Re-evaluation, 97110- Therapeutic Exercise, [phone]- Neuro Re-education, [phone] - Gait Training, [phone] - Manual Therapy, [phone] - Therapeutic Activities, [phone] - Self Care, [phone] - Electrical stimulation (Manual), [id number] - Ultrasound, and [id number] - Aquatic therapy    Check all conditions that are expected to impact treatment: {Conditions expected to impact treatment:Morbid obesity and Diabetes mellitus   If treatment provided at initial evaluation, no treatment charged due to lack of authorization.

## 2022-09-18 ENCOUNTER — Ambulatory Visit: Payer: MEDICAID

## 2022-09-18 DIAGNOSIS — R262 Difficulty in walking, not elsewhere classified: Secondary | ICD-10-CM

## 2022-09-18 DIAGNOSIS — M25562 Pain in left knee: Secondary | ICD-10-CM | POA: Diagnosis not present

## 2022-09-18 DIAGNOSIS — G8929 Other chronic pain: Secondary | ICD-10-CM

## 2022-09-18 DIAGNOSIS — M6281 Muscle weakness (generalized): Secondary | ICD-10-CM
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
## 2022-09-23 ENCOUNTER — Ambulatory Visit: Payer: MEDICAID | Admitting: Physical Therapy

## 2022-09-25 ENCOUNTER — Ambulatory Visit: Payer: MEDICAID

## 2022-09-28 ENCOUNTER — Ambulatory Visit: Payer: MEDICAID | Admitting: Physical Therapy

## 2022-09-30 ENCOUNTER — Ambulatory Visit: Payer: MEDICAID | Admitting: Physical Therapy

## 2022-10-01 DIAGNOSIS — E1065 Type 1 diabetes mellitus with hyperglycemia: Secondary | ICD-10-CM | POA: Diagnosis not present

## 2022-10-05 ENCOUNTER — Ambulatory Visit: Payer: MEDICAID | Admitting: Physical Therapy

## 2022-10-07 ENCOUNTER — Ambulatory Visit: Payer: MEDICAID

## 2022-10-16 DIAGNOSIS — F4310 Post-traumatic stress disorder, unspecified: Secondary | ICD-10-CM | POA: Diagnosis not present

## 2022-10-27 DIAGNOSIS — G4733 Obstructive sleep apnea (adult) (pediatric): Secondary | ICD-10-CM | POA: Diagnosis not present

## 2022-11-02 DIAGNOSIS — E1065 Type 1 diabetes mellitus with hyperglycemia: Secondary | ICD-10-CM | POA: Diagnosis not present

## 2022-11-13 DIAGNOSIS — F4310 Post-traumatic stress disorder, unspecified: Secondary | ICD-10-CM | POA: Diagnosis not present

## 2022-11-20 IMAGING — CR DG CHEST 2V
2 series · 2 of 2 positions shown · non-contrast
Comparison: 01/30/2021

CLINICAL DATA: Status post chest wall mass resection last [REDACTED].
Chest pain.

EXAM:
CHEST - 2 VIEW

[w chest pa]
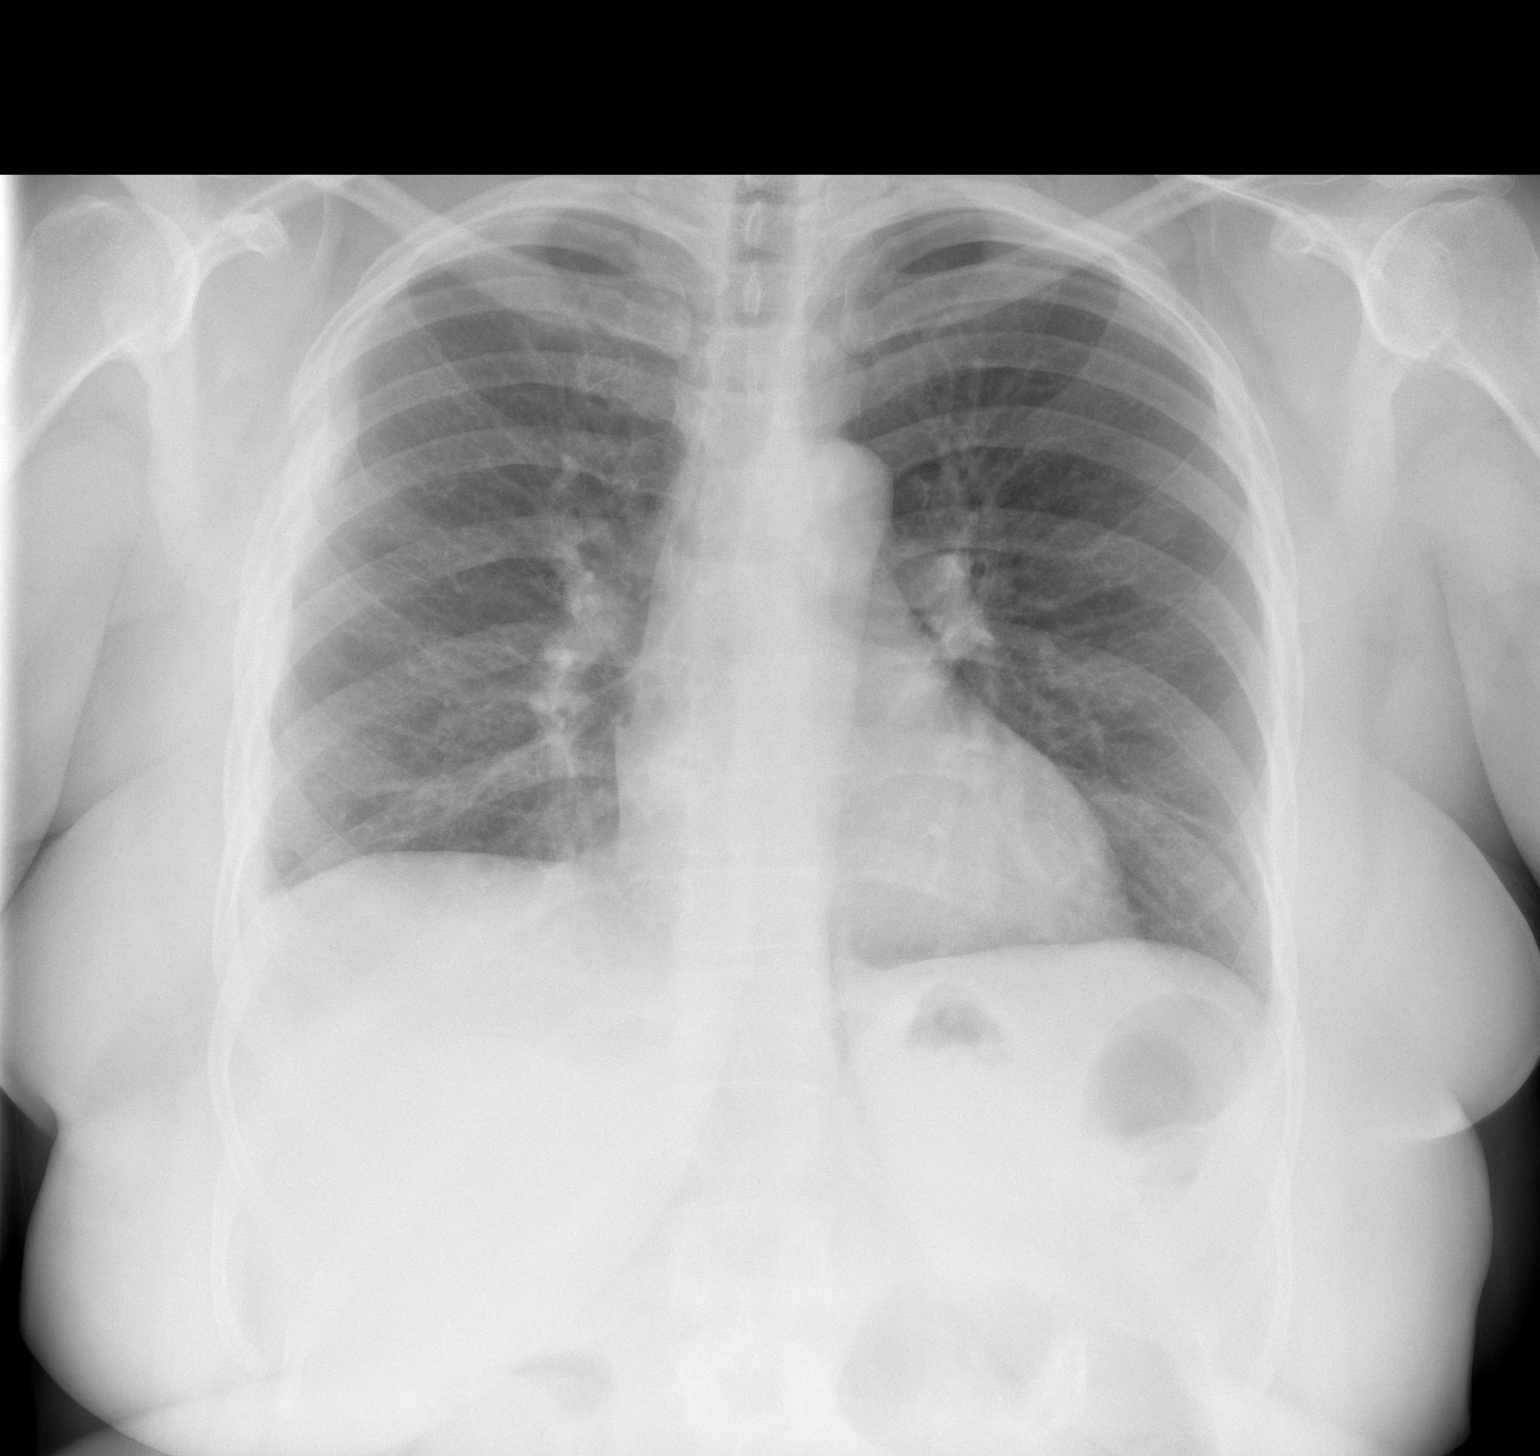

[w chest lat]
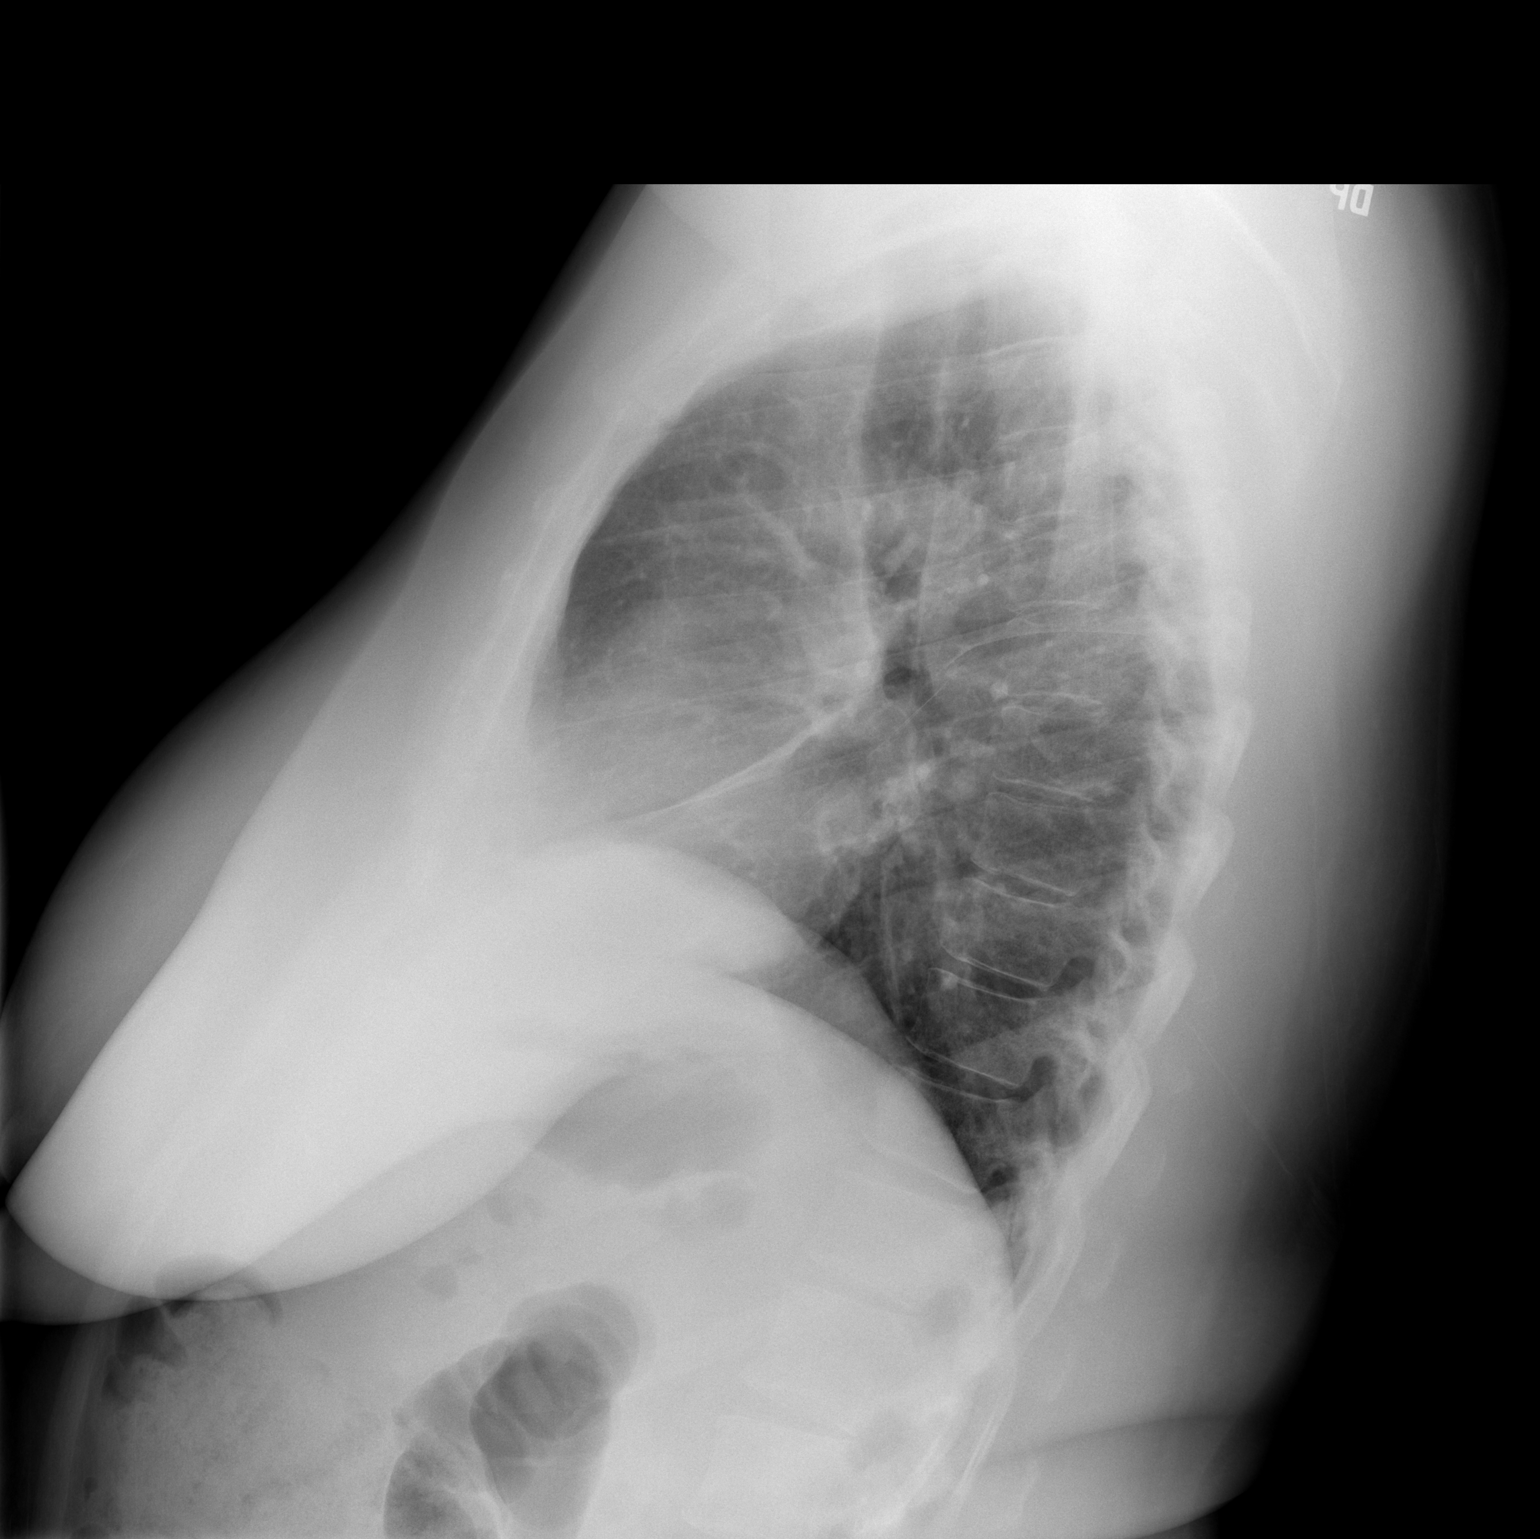

[2 of 2 positions shown; findings below may reference images not displayed]

FINDINGS: Superolateral right chest wall deformity is likely the site of prior
surgery. Midline trachea. Normal heart size and mediastinal
contours. Minimal right minor fissure thickening is unchanged. Mild
right hemidiaphragm elevation with minimal right infrahilar volume
loss.
IMPRESSION: Right-sided postsurgical changes with minimal postoperative volume
loss. No acute process or explanation for chest pain.

## 2022-11-27 DIAGNOSIS — E1042 Type 1 diabetes mellitus with diabetic polyneuropathy: Secondary | ICD-10-CM | POA: Diagnosis not present

## 2022-11-27 DIAGNOSIS — Z8639 Personal history of other endocrine, nutritional and metabolic disease: Secondary | ICD-10-CM | POA: Diagnosis not present

## 2022-11-27 DIAGNOSIS — Z794 Long term (current) use of insulin: Secondary | ICD-10-CM | POA: Diagnosis not present

## 2022-11-27 DIAGNOSIS — E1065 Type 1 diabetes mellitus with hyperglycemia: Secondary | ICD-10-CM | POA: Diagnosis not present

## 2022-11-27 DIAGNOSIS — Z23 Encounter for immunization: Secondary | ICD-10-CM | POA: Diagnosis not present

## 2022-11-30 ENCOUNTER — Encounter: Payer: Self-pay | Admitting: Family Medicine

## 2022-11-30 ENCOUNTER — Ambulatory Visit: Payer: MEDICAID | Admitting: Family Medicine

## 2022-11-30 ENCOUNTER — Telehealth: Payer: Self-pay

## 2022-11-30 VITALS — BP 108/72 | HR 98 | Temp 98.4°F | Ht 67.0 in | Wt 247.6 lb

## 2022-11-30 DIAGNOSIS — Z23 Encounter for immunization: Secondary | ICD-10-CM

## 2022-11-30 DIAGNOSIS — J069 Acute upper respiratory infection, unspecified: Secondary | ICD-10-CM | POA: Diagnosis not present

## 2022-11-30 NOTE — Telephone Encounter (Signed)
Patient calls nurse line requesting to schedule appointment for fever, dizziness, decreased appetite and mild cough.   She reports that symptoms started on Friday after receiving flu vaccine. Tmax 102.   She denies redness, warmth or pain around injection site.   Scheduled patient this afternoon at 2:50 for further evaluation.   Veronda Prude, RN

## 2022-11-30 NOTE — Patient Instructions (Signed)
It was wonderful to see you today.  Please bring ALL of your medications with you to every visit.   Today we talked about:  You most likely have a viral illness causing the fevers and congestion. Continue taking tylenol as needed and you can alternate with ibuprofen. For the congestion you can take mucinex. You can cut your losartan in half for the next few days while you are eating and drinking less since you feel light headed. Please follow up on Monday and keep checking your temperature at home.    Thank you for choosing [facility].   Please call [phone] with any questions about today's appointment.  Lockie Mola, MD  Family Medicine

## 2022-11-30 NOTE — Progress Notes (Signed)
    SUBJECTIVE:   CHIEF COMPLAINT / HPI:   URI Symptoms  Patient reports she has been having myalgias, fever (101, 102), congestion, decreased appetite since Saturday, 2 days ago. She got her flu vaccine on Friday. She works at [employer] and says that many people have been sick around her. Denies shortness of breath. Has been taking tylenol daily. Has not been taking any other medications for risk of making her sugars elevated. She has had decreased po but has been eating and drinking little amounts often so that she does not cause her blood sugar to drop since she has an insulin pump. Says her sugars have been running slightly higher around 140s. No vomiting. No polyuria or polydipsia.  PERTINENT  PMH / PSH: T1DM, OSA  OBJECTIVE:   BP 108/72   Pulse 98   Temp 98.4 F (36.9 C) (Oral)   Ht 5\' 7"  (1.702 m)   Wt 247 lb 9.6 oz (112.3 kg)   SpO2 98%   BMI 38.78 kg/m   General: sick appearing though not in acute distress HEENT: congested, rhinorrhea, erythematous oropharynx CV: RRR, radial pulses equal and palpable, no BLE edema  Resp: Normal work of breathing on room air, CTAB Abd: Soft, non tender, non distended    ASSESSMENT/PLAN:   Assessment & Plan Viral URI Most likely due to viral uri rather than side effects from vaccine given timeline. Unlikely bacterial infection at this time. However, if patient continues to be febrile and does develop shortness of breath would be concerned for development of pneumonia or other bacterial infection.  Concern for DKA with T1DM if patient is unable to maintain po.  - Counseled regarding small frequent hydration  - Continue tylenol and ibuprofen as needed.  - Work note written for patient       Lockie Mola, MD [facility]

## 2022-12-03 ENCOUNTER — Ambulatory Visit: Payer: MEDICAID | Admitting: Internal Medicine

## 2022-12-07 ENCOUNTER — Ambulatory Visit: Payer: Self-pay | Admitting: Family Medicine

## 2022-12-07 NOTE — Progress Notes (Deleted)
    SUBJECTIVE:   CHIEF COMPLAINT / HPI:   Fu URI, T1DM  Seen in access to care on 11/11. Concern was for elevated CBG to the 140s in the setting of decreased po intake with viral URI  PERTINENT  PMH / PSH: ***  OBJECTIVE:   There were no vitals taken for this visit.  ***  ASSESSMENT/PLAN:   No problem-specific Assessment & Plan notes found for this encounter.     Gerrit Heck, DO [facility]

## 2022-12-23 DIAGNOSIS — E1065 Type 1 diabetes mellitus with hyperglycemia: Secondary | ICD-10-CM | POA: Diagnosis not present

## 2023-01-03 IMAGING — CR DG ELBOW COMPLETE 3+V*R*
4 series · 4 of 4 positions shown · non-contrast
Comparison: None.

CLINICAL DATA: Fall, elbow pain.  Initial encounter.

EXAM:
RIGHT ELBOW - COMPLETE 3+ VIEW

[x elbow ap right]
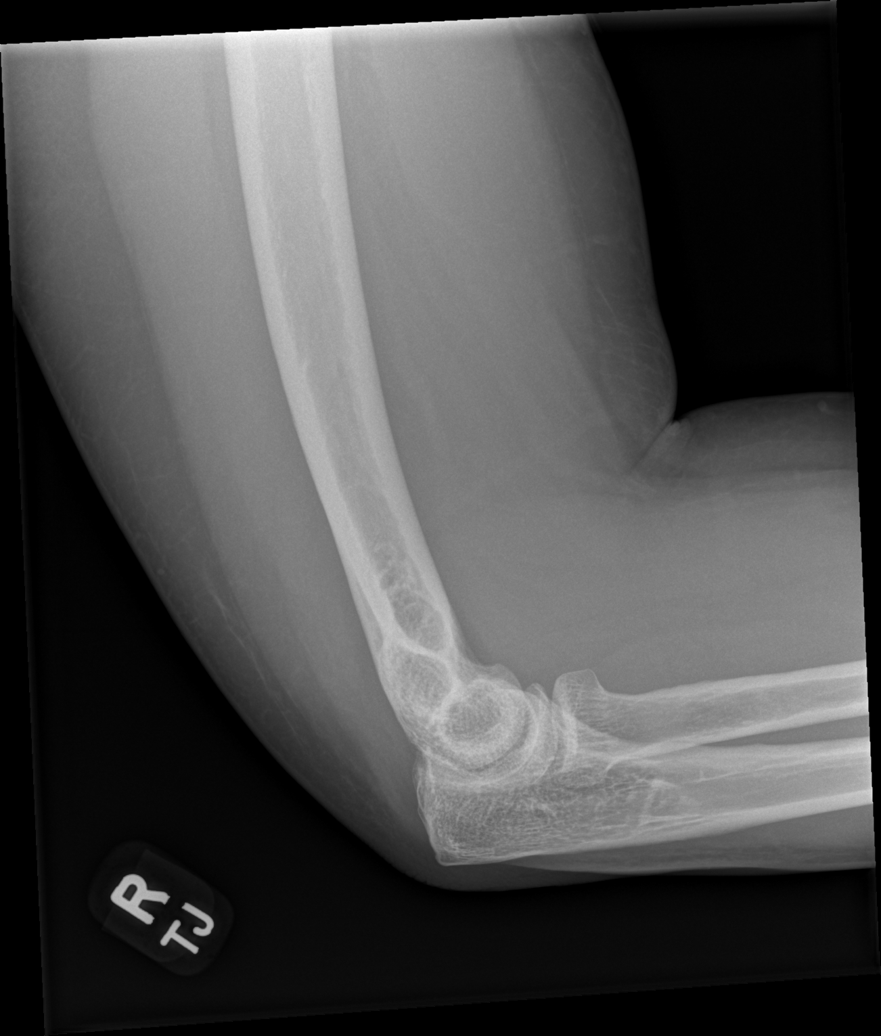

[x elbow obl right (1 of 2)]
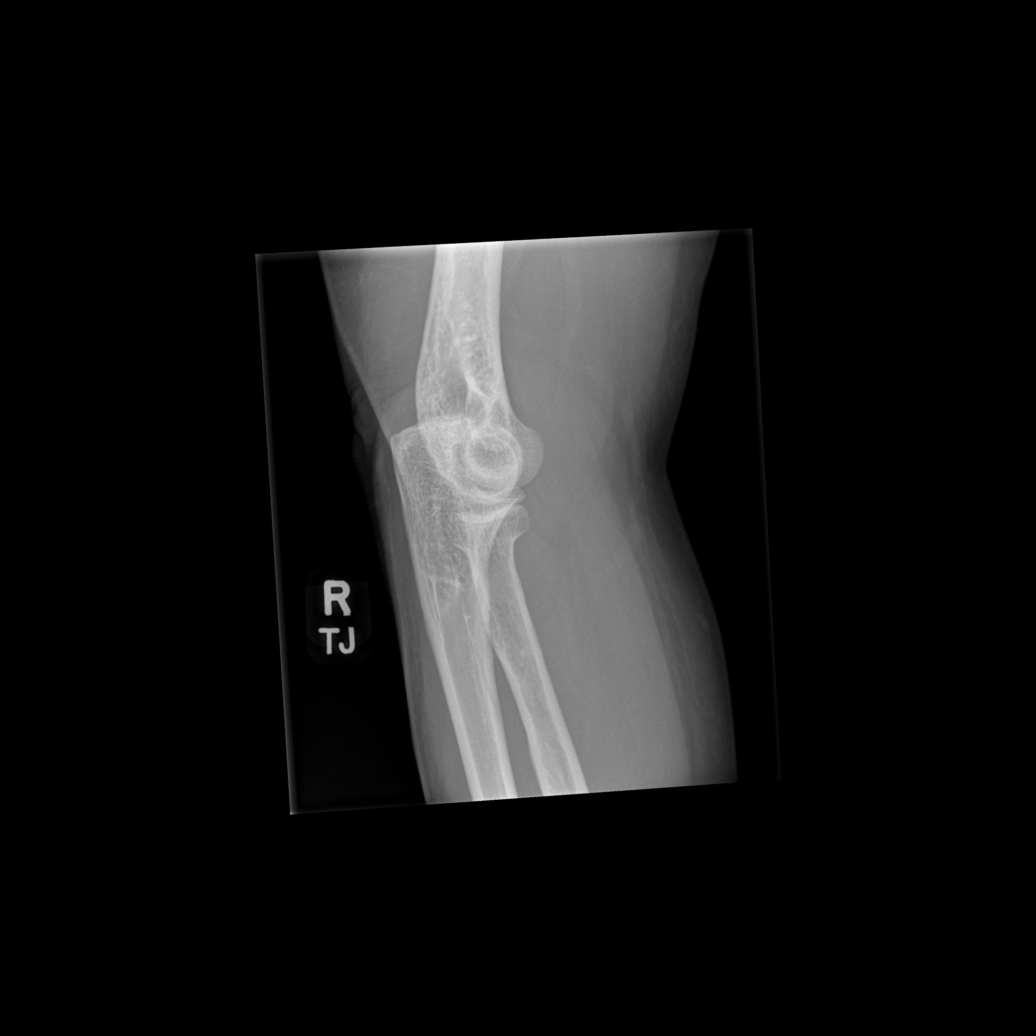

[x elbow obl right (2 of 2)]
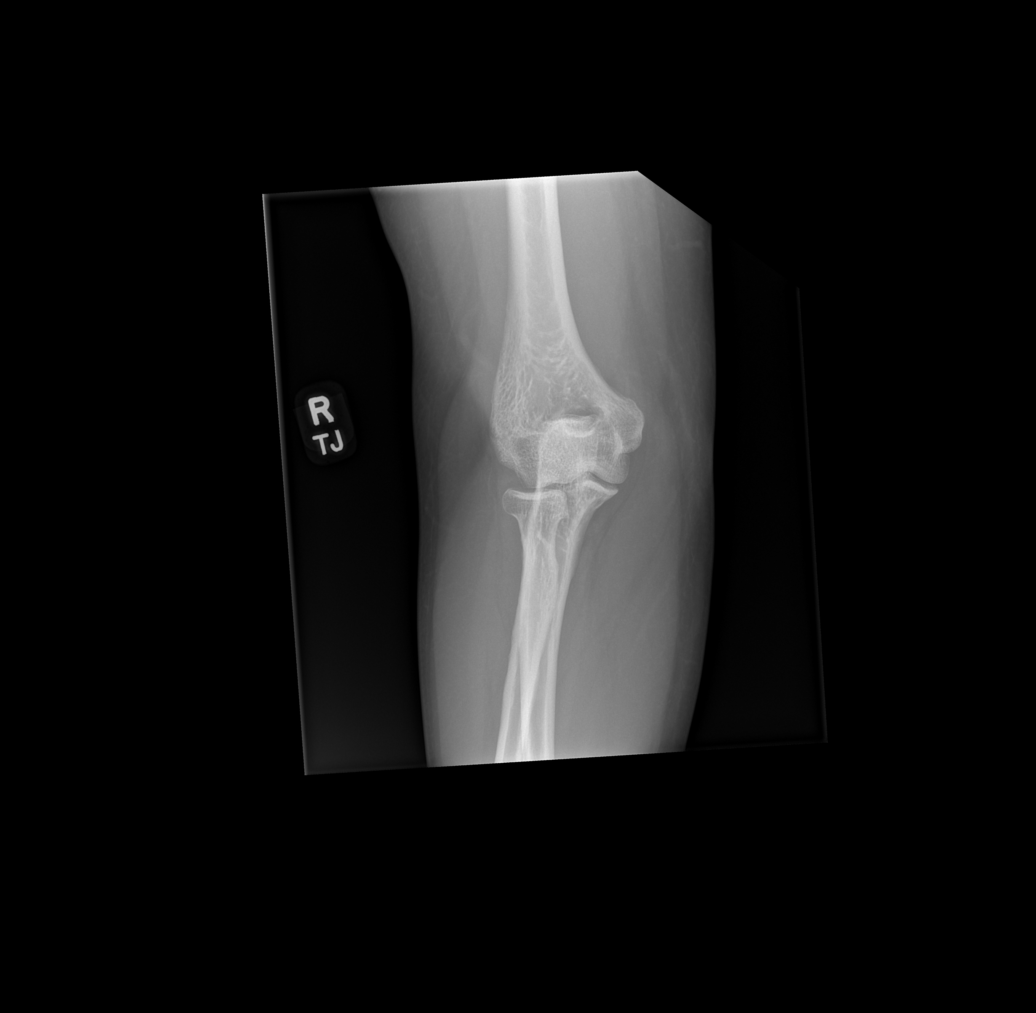

[x elbow lat right]
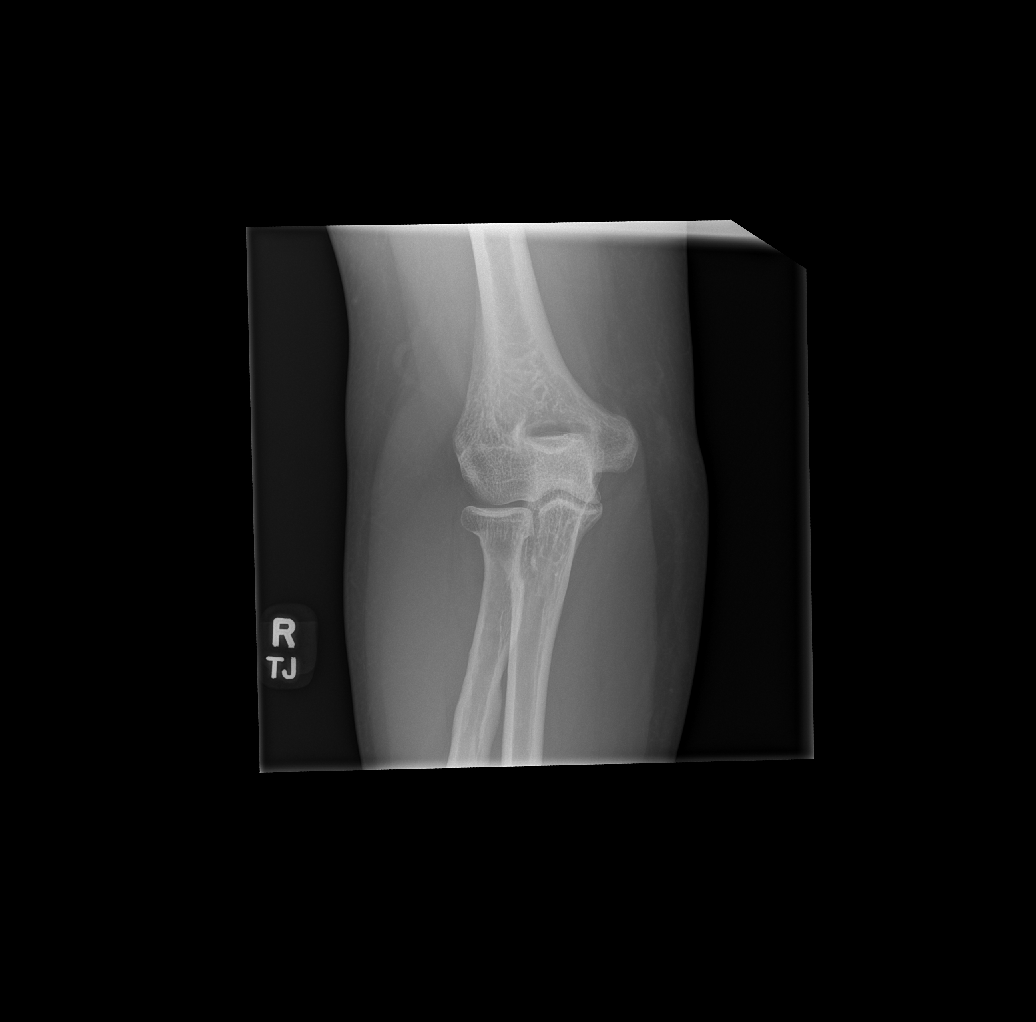

[4 of 4 positions shown; findings below may reference images not displayed]

FINDINGS: No acute osseous or joint abnormality. There may be slight soft
tissue swelling over the olecranon.
IMPRESSION: No acute osseous or joint abnormality.

## 2023-01-15 ENCOUNTER — Ambulatory Visit (INDEPENDENT_AMBULATORY_CARE_PROVIDER_SITE_OTHER): Payer: MEDICAID | Admitting: Family Medicine

## 2023-01-15 ENCOUNTER — Other Ambulatory Visit (HOSPITAL_COMMUNITY)
Admission: RE | Admit: 2023-01-15 | Discharge: 2023-01-15 | Disposition: A | Discharge status: Home / Self Care | Payer: MEDICAID | Source: Ambulatory Visit | Attending: Family Medicine | Admitting: Family Medicine

## 2023-01-15 ENCOUNTER — Encounter: Payer: Self-pay | Admitting: Family Medicine

## 2023-01-15 VITALS — BP 106/62 | HR 81 | Wt 245.0 lb

## 2023-01-15 DIAGNOSIS — Z113 Encounter for screening for infections with a predominantly sexual mode of transmission: Secondary | ICD-10-CM | POA: Insufficient documentation

## 2023-01-15 DIAGNOSIS — N898 Other specified noninflammatory disorders of vagina: Secondary | ICD-10-CM | POA: Diagnosis not present

## 2023-01-15 LAB — POCT WET PREP (WET MOUNT)
Clue Cells Wet Prep Whiff POC: POSITIVE
Trichomonas Wet Prep HPF POC: ABSENT
WBC, Wet Prep HPF POC: NONE SEEN

## 2023-01-15 NOTE — Progress Notes (Signed)
    SUBJECTIVE:   CHIEF COMPLAINT / HPI:   Vaginal odor Patient has noticed vaginal odor for the last few days.  Denies itching, discharge, burning with urination.  She has had BV and yeast infections in the past which resolved with treatment. She would like to be tested for these as well as STI screening, HIV, RPR.  PERTINENT  PMH / PSH:  T1DM  OBJECTIVE:   BP 106/62   Pulse 81   Wt 245 lb (111.1 kg)   LMP 12/20/2022   SpO2 96%   BMI 38.37 kg/m   General: well-appearing, pleasant, no acute distress. Extremities: no peripheral edema. Moves all extremities equally. GU Exam:  External exam: Normal-appearing female external genitalia.   Vaginal exam notable for normal vaginal mucosa without lesions or erythema.  Cervix without discharge or obvious lesion. Chaperoned exam, CMA Paige.   ASSESSMENT/PLAN:   Vaginal odor No evidence of yeast or trichomonas on wet prep. Does have few clue cells and positive whiff test, however does not meet Amsel's criteria for BV without discharge. - no treatment indicated at this time - pt will continue to monitor and return if symptoms worsen or do not resolve - will also collect HIV and RPR labs today per pt request     Cyndia Skeeters, DO [facility] Center

## 2023-01-15 NOTE — Assessment & Plan Note (Addendum)
No evidence of yeast or trichomonas on wet prep. Does have few clue cells and positive whiff test, however does not meet Amsel's criteria for BV without discharge. - no treatment indicated at this time - pt will continue to monitor and return if symptoms worsen or do not resolve - will also collect HIV and RPR labs today per pt request

## 2023-01-15 NOTE — Patient Instructions (Addendum)
Dear Barbara Clark  Today we discussed the following concerns and plans:  Vaginal odor: - You were negative for BV and yeast. - I will let you know the results of your other tests when they come back.  If you have any concerns, please call the clinic or schedule an appointment.  It was a pleasure to take care of you today. Be well!  Cyndia Skeeters, DO [facility], PGY-1

## 2023-01-16 LAB — RPR: RPR Ser Ql: NONREACTIVE

## 2023-01-16 LAB — HIV ANTIBODY (ROUTINE TESTING W REFLEX): HIV Screen 4th Generation wRfx: NONREACTIVE

## 2023-01-18 LAB — CERVICOVAGINAL ANCILLARY ONLY
Chlamydia: NEGATIVE
Comment: NEGATIVE
Comment: NORMAL
Neisseria Gonorrhea: NEGATIVE

## 2023-01-22 ENCOUNTER — Other Ambulatory Visit: Payer: Self-pay | Admitting: Family Medicine

## 2023-01-25 DIAGNOSIS — E1065 Type 1 diabetes mellitus with hyperglycemia: Secondary | ICD-10-CM | POA: Diagnosis not present

## 2023-02-10 NOTE — Progress Notes (Signed)
HPI female Smoker followed for OSA, complicated by HTN, Pleural Lipoma resected, Loculated R Pleural Effusion,  DM 1/neuropathy, DeQuervain's Tenosynovitis, obesity, depression/history conversion Disorder HST 10/15/20- AHI 26/ hr, desaturation to 78%, body weight 255 lbs HST 06/22/22- AHI 49/ hr, desaturation to 74%, body weight 243 lbs ==============================================================    08/17/22- 48 yo female former Smoker followed for OSA, complicated by HTN, Pleural Lipoma resected, Loculated R Pleural Effusion, Tobacco use,  DM 1/neuropathy, DeQuervain's Tenosynovitis, obesity, depression/history conversion Disorder HST 06/22/22- AHI 49/ hr, desaturation to 74%, body weight 243 lbs CPAP 5-20/ [employer]   [employer]  ([employer] download) Download compliance 33%, AHI 0.7/ hr     avg 7-9 Body weight today-243 lbs Download reviewed. Compliance goals and comfort issues addressed. Using nasal pillows, she says her nose gets congested and she can't breathe. We will reduce pressure.   Question need for PFT.  02/12/23- 48 yo female former Smoker followed for OSA, complicated by HTN, Pleural Lipoma resected 2022, Loculated R Pleural Effusion,  DM 1/neuropathy, DeQuervain's Tenosynovitis, obesity, depression/history conversion Disorder CPAP 4-10/ [employer]   [employer]3 [employer]  ([employer] download) Download compliance   16%, AHI 1/hr    Body weight today- 245Lbs Discussed the use of AI scribe software for clinical note transcription with the patient, who gave verbal consent to proceed.  History of Present Illness   The patient, with a history of sleep apnea, presents with difficulty tolerating her CPAP mask due to feelings of claustrophobia. She has tried both the nasal pillows and the full face mask, but has been unable to consistently use either due to discomfort and difficulty breathing. She has been using an old mask, but reports that it is not effective and she often removes it during the night  due to inability to breathe. She reports occasional shortness of breath during the day, but denies coughing or wheezing. She had a clear chest x-ray in August. .She is due to see her surgeon next month for a follow-up after chest wall lipoma removal.    CXR 09/08/22 IMPRESSION: No acute cardiopulmonary process.  ROS-see HPI   + = positive Constitutional:    weight loss, night sweats, fevers, chills, +fatigue, lassitude. HEENT:    headaches, difficulty swallowing, tooth/dental problems, sore throat,       sneezing, itching, ear ache, nasal congestion, post nasal drip, snoring CV:    chest pain, orthopnea, PND, swelling in lower extremities, anasarca,                                   dizziness, palpitations Resp:  + shortness of breath with exertion or at rest.                productive cough,   non-productive cough, coughing up of blood.              change in color of mucus.  wheezing.   Skin:    rash or lesions. GI:  No-   heartburn, indigestion, abdominal pain, nausea, vomiting, diarrhea,                 change in bowel habits, loss of appetite GU: dysuria, change in color of urine, no urgency or frequency.   flank pain. MS:   joint pain, stiffness, decreased range of motion, back pain. Neuro-     nothing unusual Psych:  change in mood or affect. + depression or anxiety.  memory loss.  OBJ- Physical Exam General- Alert, Oriented, Affect-appropriate, Distress- none acute, + obese, + looks tired Skin- rash-none, lesions- none, excoriation- none Lymphadenopathy- none Head- atraumatic            Eyes- Gross vision intact, PERRLA, conjunctivae and secretions clear            Ears- Hearing, canals-normal            Nose- Clear, no-Septal dev, mucus, polyps, erosion, perforation             Throat- Mallampati IV , mucosa clear , drainage- none, tonsils- atrophic, + teeth Neck- flexible , trachea midline, no stridor , thyroid nl, carotid no bruit Chest - symmetrical excursion ,  unlabored           Heart/CV- RRR , no murmur , no gallop  , no rub, nl s1 s2                           - JVD- none , edema- none, stasis changes- none, varices- none           Lung- clear to P&A, wheeze- none, cough- none , dullness-none, rub- none           Chest wall-  Abd-  Br/ Gen/ Rectal- Not done, not indicated Extrem- cyanosis- none, clubbing, none, atrophy- none, strength- nl Neuro- grossly intact to observation  Assessment and Plan    Obstructive Sleep Apnea   Patient reports discomfort with current CPAP mask leading to inconsistent use. No coughing or wheezing. Lungs sound clear on examination.   -Request home care company to refit patient with a mask of her choice.   -Encourage consistent use of CPAP for optimal benefit.   -Consider alternatives to CPAP if consistent use cannot be achieved with new mask.   Lipoma -follow up with Thoracic Surgery as planned

## 2023-02-12 ENCOUNTER — Ambulatory Visit (INDEPENDENT_AMBULATORY_CARE_PROVIDER_SITE_OTHER): Payer: MEDICAID | Admitting: Internal Medicine

## 2023-02-12 ENCOUNTER — Encounter: Payer: Self-pay | Admitting: Internal Medicine

## 2023-02-12 VITALS — BP 110/70 | HR 89 | Ht 67.0 in | Wt 245.6 lb

## 2023-02-12 DIAGNOSIS — G4733 Obstructive sleep apnea (adult) (pediatric): Secondary | ICD-10-CM

## 2023-02-12 DIAGNOSIS — E1065 Type 1 diabetes mellitus with hyperglycemia: Secondary | ICD-10-CM | POA: Diagnosis not present

## 2023-02-12 NOTE — Patient Instructions (Signed)
Order- DME [employer]- please refit mask of choice for comfort and compliance. Continue auto 4-10

## 2023-03-02 ENCOUNTER — Encounter: Payer: Self-pay | Admitting: Internal Medicine

## 2023-04-06 ENCOUNTER — Ambulatory Visit (INDEPENDENT_AMBULATORY_CARE_PROVIDER_SITE_OTHER): Payer: MEDICAID

## 2023-04-06 DIAGNOSIS — Z111 Encounter for screening for respiratory tuberculosis: Secondary | ICD-10-CM

## 2023-04-06 NOTE — Progress Notes (Signed)
 Patients presents to nurse clinic for PPD placement. PPD placed in left ventral forearm with no complication.  Patient to return on 3/20 to have site read.

## 2023-04-07 DIAGNOSIS — E1065 Type 1 diabetes mellitus with hyperglycemia: Secondary | ICD-10-CM | POA: Diagnosis not present

## 2023-04-08 ENCOUNTER — Ambulatory Visit

## 2023-04-08 DIAGNOSIS — Z111 Encounter for screening for respiratory tuberculosis: Secondary | ICD-10-CM

## 2023-04-08 LAB — TB SKIN TEST
Induration: 0 mm
TB Skin Test: NEGATIVE

## 2023-04-08 NOTE — Progress Notes (Signed)
PPD Reading Note PPD read and results entered in EpicCare. Result: 0 mm induration. Interpretation: Negative Allergic reaction: No  

## 2023-04-09 ENCOUNTER — Encounter: Payer: Self-pay | Admitting: Family Medicine

## 2023-04-13 ENCOUNTER — Ambulatory Visit: Admitting: Family Medicine

## 2023-04-13 ENCOUNTER — Encounter: Payer: Self-pay | Admitting: Family Medicine

## 2023-04-13 VITALS — BP 104/60 | HR 84 | Ht 67.0 in | Wt 245.1 lb

## 2023-04-13 DIAGNOSIS — K649 Unspecified hemorrhoids: Secondary | ICD-10-CM | POA: Diagnosis not present

## 2023-04-13 DIAGNOSIS — N3000 Acute cystitis without hematuria: Secondary | ICD-10-CM

## 2023-04-13 DIAGNOSIS — E1040 Type 1 diabetes mellitus with diabetic neuropathy, unspecified: Secondary | ICD-10-CM | POA: Diagnosis not present

## 2023-04-13 DIAGNOSIS — R300 Dysuria: Secondary | ICD-10-CM | POA: Diagnosis not present

## 2023-04-13 DIAGNOSIS — N393 Stress incontinence (female) (male): Secondary | ICD-10-CM | POA: Diagnosis not present

## 2023-04-13 LAB — POCT URINALYSIS DIP (MANUAL ENTRY)
Bilirubin, UA: NEGATIVE
Blood, UA: NEGATIVE
Glucose, UA: NEGATIVE mg/dL
Leukocytes, UA: NEGATIVE
Nitrite, UA: POSITIVE — AB
Spec Grav, UA: 1.025 (ref 1.010–1.025)
Urobilinogen, UA: 1 U/dL
pH, UA: 6 (ref 5.0–8.0)

## 2023-04-13 LAB — POCT UA - MICROSCOPIC ONLY: WBC, Ur, HPF, POC: NONE SEEN (ref 0–5)

## 2023-04-13 MED ORDER — CEFADROXIL 500 MG PO CAPS
500.0000 mg | ORAL_CAPSULE | Freq: Two times a day (BID) | ORAL | 0 refills | Status: AC
Start: 2023-04-13 — End: 2023-04-18

## 2023-04-13 NOTE — Assessment & Plan Note (Signed)
 Likely secondary to pelvic floor dysfunction, opted for home exercises. -Provided pelvic floor therapy information

## 2023-04-13 NOTE — Progress Notes (Signed)
    SUBJECTIVE:   CHIEF COMPLAINT / HPI:   Urinary frequency and urgency In the past 2 weeks having worsening urgency, reports she has been unable to make it to the bathroom before her urine starts.  Reports she cannot hold her urine at times.  Has had leakage of urine for years, particularly with coughing and sneezing.  Denies hematuria. Denies N/V and fever.  SVD x 3  Hemorrhoids Feels like they are hanging out. Some blood with wiping. Using a lot of toilet paper to feel like she is getting clean.  Daily BM but having to strain at times.  Denies significant pain or discomfort.  PERTINENT  PMH / PSH: OSA, T1DM, MDD  OBJECTIVE:   BP 104/60   Pulse 84   Ht 5\' 7"  (1.702 m)   Wt 245 lb 2 oz (111.2 kg)   LMP 03/18/2023   SpO2 98%   BMI 38.39 kg/m    General: Alert, no apparent distress, well groomed HEENT: Normocephalic, atraumatic, moist mucus membranes, neck supple Respiratory: Normal respiratory effort GI: Non-distended Skin: No rashes, no jaundice Psych: Appropriate mood and affect  ASSESSMENT/PLAN:   Assessment & Plan Acute cystitis without hematuria Nitrite positive, will treat for UTI.  If persistent symptoms after treatment of UTI, may be underlying urgency incontinence. -Cefadroxil 500 mg twice daily x 5 days -Follow-up urine culture DM neuropathy, type I diabetes mellitus (HCC) ACR today.  Sees endocrinology for T1DM management Stress incontinence Likely secondary to pelvic floor dysfunction, opted for home exercises. -Provided pelvic floor therapy information Hemorrhoids, unspecified hemorrhoid type Declined exam today, per history did not sound thrombosed.  Recommend supportive care with stool softeners as needed to prevent straining.  Can utilize OTC Preparation H for symptomatic relief. -Due for colonoscopy for colon cancer screening, and follow-up with GI   Dr. Elberta Fortis, DO [facility]

## 2023-04-13 NOTE — Patient Instructions (Addendum)
 It was wonderful to see you today! Thank you for choosing [facility].   Please bring ALL of your medications with you to every visit.   Today we talked about:  You do have a UTI, please take the cefadroxil twice per day for the next 5 days.  As we discussed I am sending a urine culture and may need to change the antibiotic based on the results.  I will call you to discuss further if this is the case. I do think you have some stress incontinence this likely comes from pelvic floor dysfunction.  I would like you to try to do pelvic floor exercises at home to see if they will improve your symptoms.  You may have a form of urgency incontinence as well but I think we need to treat your UTI first and see if that helps. Hemorrhoids are usually because of straining while going to the bathroom.  You can use stool softeners to prevent you from having to strain such as MiraLAX or taking fiber supplementation such as Metamucil or psyllium husk.  You can also use Preparation H for any discomfort in that area that comes as both wipes and ointment.  If they become very bothersome the last resort would be surgical intervention but as we discussed this is difficult to heal from.  Please follow up as neede for persistent symptoims   We are checking some labs today. If they are abnormal, I will call you. If they are normal, I will send you a MyChart message (if it is active) or a letter in the mail. If you do not hear about your labs in the next 2 weeks, please call the office.  Call the clinic at [phone] if your symptoms worsen or you have any concerns.  Please be sure to schedule follow up at the front desk before you leave today.   Elberta Fortis, DO Family Medicine

## 2023-04-13 NOTE — Assessment & Plan Note (Signed)
 Declined exam today, per history did not sound thrombosed.  Recommend supportive care with stool softeners as needed to prevent straining.  Can utilize OTC Preparation H for symptomatic relief. -Due for colonoscopy for colon cancer screening, and follow-up with GI

## 2023-04-13 NOTE — Assessment & Plan Note (Signed)
 ACR today.  Sees endocrinology for T1DM management

## 2023-04-14 LAB — MICROALBUMIN / CREATININE URINE RATIO
Creatinine, Urine: 212.4 mg/dL
Microalb/Creat Ratio: 6 mg/g{creat} (ref 0–29)
Microalbumin, Urine: 13.5 ug/mL

## 2023-04-15 ENCOUNTER — Encounter: Payer: Self-pay | Admitting: Family Medicine

## 2023-04-16 LAB — URINE CULTURE

## 2023-05-07 DIAGNOSIS — E1065 Type 1 diabetes mellitus with hyperglycemia: Secondary | ICD-10-CM | POA: Diagnosis not present

## 2023-06-03 DIAGNOSIS — E669 Obesity, unspecified: Secondary | ICD-10-CM | POA: Diagnosis not present

## 2023-06-03 DIAGNOSIS — E1065 Type 1 diabetes mellitus with hyperglycemia: Secondary | ICD-10-CM | POA: Diagnosis not present

## 2023-06-03 DIAGNOSIS — Z794 Long term (current) use of insulin: Secondary | ICD-10-CM | POA: Diagnosis not present

## 2023-06-03 DIAGNOSIS — Z8639 Personal history of other endocrine, nutritional and metabolic disease: Secondary | ICD-10-CM | POA: Diagnosis not present

## 2023-06-03 DIAGNOSIS — E1042 Type 1 diabetes mellitus with diabetic polyneuropathy: Secondary | ICD-10-CM | POA: Diagnosis not present

## 2023-06-03 LAB — LIPID PANEL
Cholesterol: 109 (ref 0–200)
HDL: 37 (ref 35–70)
LDL Cholesterol: 60
Triglycerides: 46 (ref 40–160)

## 2023-06-03 LAB — COMPREHENSIVE METABOLIC PANEL WITH GFR: eGFR: 99

## 2023-06-03 LAB — BASIC METABOLIC PANEL WITH GFR: Potassium: 4.3 meq/L (ref 3.5–5.1)

## 2023-06-03 LAB — TSH: TSH: 4.6 (ref 0.41–5.90)

## 2023-06-03 LAB — HEMOGLOBIN A1C: Hemoglobin A1C: 8.3

## 2023-06-07 DIAGNOSIS — E1065 Type 1 diabetes mellitus with hyperglycemia: Secondary | ICD-10-CM | POA: Diagnosis not present

## 2023-06-21 ENCOUNTER — Ambulatory Visit (INDEPENDENT_AMBULATORY_CARE_PROVIDER_SITE_OTHER): Admitting: Student

## 2023-06-21 ENCOUNTER — Encounter: Payer: Self-pay | Admitting: Student

## 2023-06-21 VITALS — BP 114/80 | HR 80 | Wt 249.0 lb

## 2023-06-21 DIAGNOSIS — R8290 Unspecified abnormal findings in urine: Secondary | ICD-10-CM

## 2023-06-21 LAB — POCT URINALYSIS DIP (MANUAL ENTRY)
Bilirubin, UA: NEGATIVE
Blood, UA: NEGATIVE
Glucose, UA: NEGATIVE mg/dL
Ketones, POC UA: NEGATIVE mg/dL
Leukocytes, UA: NEGATIVE
Nitrite, UA: NEGATIVE
Protein Ur, POC: NEGATIVE mg/dL
Spec Grav, UA: 1.025 (ref 1.010–1.025)
Urobilinogen, UA: 1 U/dL
pH, UA: 6 (ref 5.0–8.0)

## 2023-06-21 NOTE — Patient Instructions (Signed)
 It was great to see you today! Thank you for choosing [facility] for your primary care.  Today we addressed: Foul-smelling urine generally comes from dietary causes.  Your urine was clean, I would not recommend antibiotics at this time.  If you haven't already, sign up for My Chart to have easy access to your labs results, and communication with your primary care physician.  Return if symptoms worsen or fail to improve. Please arrive 15 minutes before your appointment to ensure smooth check in process.  We appreciate your efforts in making this happen.  Thank you for allowing me to participate in your care, Veronia Goon, DO 06/21/2023, 3:51 PM PGY-3, [facility]

## 2023-06-21 NOTE — Progress Notes (Signed)
  SUBJECTIVE:   CHIEF COMPLAINT / HPI:   Barbara Clark is a 48 year old female with diabetes who presents with concerns of dehydration and possible UTI.  She experiences symptoms of dehydration for the past week, with dark, strong-smelling urine. Her urine darkens if she does not drink fluids for one to two hours. She associates these symptoms with her usual UTI episodes, although this episode is longer than usual. There is no pain or burning during urination and no new incontinence.   She has a history of UTIs, with the last episode treated with cefadroxil  a couple of months ago. She is concerned about recurrence due to her diabetes.  She manages her diabetes with an insulin  pump, maintaining blood sugar levels that sometimes fluctuate postprandially. Her current blood sugar level is 138. Recent A1c testing was ~8.3.  She had a normal creatinine and GFR when checked by her endocrinologist as well.  She takes medication for blood pressure and cholesterol at night. She uses a water  app to track fluid intake, frequently feeling dehydrated despite regular water  consumption.  PERTINENT  PMH / PSH: T1DM, OSA, MDD  OBJECTIVE:  BP 114/80   Pulse 80   Wt 249 lb (112.9 kg)   SpO2 100%   BMI 39.00 kg/m  General: Well-appearing, NAD Abdomen: No suprapubic tenderness Back: No CVA tenderness  ASSESSMENT/PLAN:   Assessment & Plan Foul smelling urine Clean UA, not consistent with UTI.  Likely related to diet rather than infectious origin.  Recommend against antibiotic treatment at this time.  Reassurance provided, patient was relieved during encounter. Return if symptoms worsen or fail to improve. Veronia Goon, DO 06/21/2023, 3:42 PM PGY-3, [facility]

## 2023-07-19 DIAGNOSIS — E1065 Type 1 diabetes mellitus with hyperglycemia: Secondary | ICD-10-CM | POA: Diagnosis not present

## 2023-07-22 DIAGNOSIS — R748 Abnormal levels of other serum enzymes: Secondary | ICD-10-CM | POA: Diagnosis not present

## 2023-07-22 DIAGNOSIS — R946 Abnormal results of thyroid function studies: Secondary | ICD-10-CM | POA: Diagnosis not present

## 2023-07-22 DIAGNOSIS — R899 Unspecified abnormal finding in specimens from other organs, systems and tissues: Secondary | ICD-10-CM | POA: Diagnosis not present

## 2023-07-22 LAB — HEPATIC FUNCTION PANEL
ALT: 12 U/L (ref 7–35)
AST: 11 — AB (ref 13–35)
Alkaline Phosphatase: 135 — AB (ref 25–125)
Alkaline Phosphatase: 140 — AB (ref 25–125)
Bilirubin, Direct: 0.1 (ref 0.01–0.4)
Bilirubin, Total: 0.4

## 2023-07-22 LAB — TSH: TSH: 3.83 (ref 0.41–5.90)

## 2023-07-22 LAB — COMPREHENSIVE METABOLIC PANEL WITH GFR
Albumin: 4 (ref 3.5–5.0)
eGFR: 99

## 2023-07-22 LAB — BASIC METABOLIC PANEL WITH GFR
Creatinine: 0.8 (ref 0.5–1.1)
Potassium: 4.3 meq/L (ref 3.5–5.1)

## 2023-07-29 LAB — LAB REPORT - SCANNED
A1c: 8.3
Albumin, Urine POC: 0.7
Albumin/Creatinine Ratio, Urine, POC: 14.3
Creatinine, POC: 49 mg/dL
EGFR: 99
Free T4: 0.93
TSH: 3.83 (ref 0.41–5.90)

## 2023-07-31 ENCOUNTER — Other Ambulatory Visit: Payer: Self-pay | Admitting: Family Medicine

## 2023-08-02 NOTE — Progress Notes (Signed)
 Scheduled patient physical for 07/29 @810 

## 2023-08-12 ENCOUNTER — Ambulatory Visit: Payer: MEDICAID | Admitting: Internal Medicine

## 2023-08-17 ENCOUNTER — Ambulatory Visit (INDEPENDENT_AMBULATORY_CARE_PROVIDER_SITE_OTHER): Admitting: Family Medicine

## 2023-08-17 ENCOUNTER — Encounter: Payer: Self-pay | Admitting: Family Medicine

## 2023-08-17 VITALS — BP 121/74 | HR 80 | Ht 67.0 in | Wt 249.0 lb

## 2023-08-17 DIAGNOSIS — F325 Major depressive disorder, single episode, in full remission: Secondary | ICD-10-CM

## 2023-08-17 DIAGNOSIS — R748 Abnormal levels of other serum enzymes: Secondary | ICD-10-CM | POA: Diagnosis not present

## 2023-08-17 DIAGNOSIS — E1040 Type 1 diabetes mellitus with diabetic neuropathy, unspecified: Secondary | ICD-10-CM | POA: Diagnosis not present

## 2023-08-17 DIAGNOSIS — Z0000 Encounter for general adult medical examination without abnormal findings: Secondary | ICD-10-CM

## 2023-08-17 DIAGNOSIS — F329 Major depressive disorder, single episode, unspecified: Secondary | ICD-10-CM | POA: Insufficient documentation

## 2023-08-17 DIAGNOSIS — Z1211 Encounter for screening for malignant neoplasm of colon: Secondary | ICD-10-CM

## 2023-08-17 DIAGNOSIS — Z6839 Body mass index (BMI) 39.0-39.9, adult: Secondary | ICD-10-CM

## 2023-08-17 NOTE — Progress Notes (Signed)
 SUBJECTIVE:   Chief compliant/HPI: annual examination  Barbara Clark is a 48 y.o. who presents today for an annual exam. Patient was seen recently by endocrinology and was told about abnormal liver test and need for liver ultrasound. Endorses RUQ discomfort that moves from her back to her RUQ and epigastric. She said it feels like gas. Occurs a few times a month. Denies any change in Bowel habit, no GI or GU concerns.  She uses Lantus  and Novolog  for back up when insulin  pump does not work. She follows closely with endocrinology for her DM1. Endorsed stable neuropathic pain  MDD: Denies any flare and is not taking her Cymbalta which she also use for DM neuropathy. Her psychologist got fired, hence she stopped receiving counseling. She has been seeing this counselor for 6 years. She does not want to reestablish care with another counseling. She is coping well she says.   Weight management: Worried about her weight and asked about Wegovy. She stated that her endocrinologist are trying to get the medication prior auth.   Review of systems form notable for As in the body of hx.   Updated history tabs and problem list: Reviewed.   OBJECTIVE:   BP 121/74   Pulse 80   Ht 5' 7 (1.702 m)   Wt 249 lb (112.9 kg)   LMP 08/04/2023   SpO2 97%   BMI 39.00 kg/m   Physical Exam Vitals and nursing note reviewed.  HENT:     Right Ear: Tympanic membrane and ear canal normal. There is no impacted cerumen.     Left Ear: Tympanic membrane and ear canal normal. There is no impacted cerumen.     Mouth/Throat:     Mouth: Mucous membranes are moist.     Pharynx: No oropharyngeal exudate or posterior oropharyngeal erythema.  Eyes:     Conjunctiva/sclera: Conjunctivae normal.     Pupils: Pupils are equal, round, and reactive to light.  Neck:     Comments: Full neck, no thyroid  nodule - recently normal TSH Cardiovascular:     Rate and Rhythm: Normal rate and regular rhythm.     Heart sounds:  Normal heart sounds. No murmur heard. Pulmonary:     Effort: Pulmonary effort is normal. No respiratory distress.     Breath sounds: Normal breath sounds. No wheezing.  Abdominal:     General: Abdomen is flat. Bowel sounds are normal. There is no distension.     Palpations: Abdomen is soft. There is no mass.     Tenderness: There is no abdominal tenderness.  Musculoskeletal:     Cervical back: Neck supple. No tenderness.     Right lower leg: No edema.     Left lower leg: No edema.     Comments: Sensory exam of the foot is normal, tested with the monofilament. Good pulses, no lesions or ulcers, good peripheral pulses.   Lymphadenopathy:     Cervical: No cervical adenopathy.  Neurological:     General: No focal deficit present.     Mental Status: She is alert and oriented to person, place, and time.  Psychiatric:        Mood and Affect: Mood normal.        Behavior: Behavior normal.      ASSESSMENT/PLAN:   Assessment & Plan Well adult exam  DM neuropathy, type I diabetes mellitus (HCC) On insulin  pump Continue follow up with Endocrinology A1C with endocrinology Will schedule ophthalmology appointment for eye exam Had Bmet  and FLP done in 2025 with endocrinology Lab reviewed  and were good.   Alkaline phosphatase elevation Lab by Endocrinology Alk Phos 147 This is being managed by endocrinology Planning to get RUQ U/S ROI signed for lab release  Major depressive disorder in remission, unspecified whether recurrent (HCC) Stable of meds Discuss reestablishing care with a psychologist since she does not want to be on meds She seems to be doing well overall.   Colon cancer screening  BMI 39.0-39.9,adult Plan to discuss Wegovy with her endocrinologist given her DM1 status Annual Examination  See AVS for age appropriate recommendations.   PHQ score 9, reviewed and discussed. Blood pressure reviewed and at goal yes.  Asked about intimate partner violence and patient  reports no concerns.  The patient currently uses nothing for contraception. Folate recommended as appropriate, minimum of 400 mcg per day.  Advanced directives N/A   Considered the following items based upon USPSTF recommendations: HIV testing: Up to date Hepatitis C: Up to date Hepatitis B: discussed Syphilis if at high risk: No concerns GC/CT not at high risk and not ordered. Lipid panel (nonfasting or fasting) discussed based upon AHA recommendations and not ordered.  Consider repeat every 4-6 years. Had recent lab with endocrinology Reviewed risk factors for latent tuberculosis and not indicated  Colonoscopy discussed. She prefers cologuard. Test ordered  Cervical cancer screening: prior Pap reviewed, repeat due in 2028 Immunizations Hep B series initiated   MyChart Activation:Already signed up   Follow up in 1  year or sooner if indicated.    Otto Fairly, MD [facility]  A1C 8.3 5/15

## 2023-08-17 NOTE — Assessment & Plan Note (Signed)
 On insulin  pump Continue follow up with Endocrinology A1C with endocrinology Will schedule ophthalmology appointment for eye exam Had Bmet and FLP done in 2025 with endocrinology Lab reviewed  and were good.

## 2023-08-17 NOTE — Assessment & Plan Note (Addendum)
 Plan to discuss Wegovy with her endocrinologist given her DM1 status

## 2023-08-17 NOTE — Assessment & Plan Note (Addendum)
 Stable of meds Discuss reestablishing care with a psychologist since she does not want to be on meds She seems to be doing well overall.

## 2023-08-17 NOTE — Patient Instructions (Addendum)
Colorectal Cancer Screening  Colorectal cancer screening is a group of tests that are used to check for colorectal cancer before symptoms develop. Colorectal refers to the colon and rectum. The colon and rectum are located at the end of the digestive tract and carry stool (feces) out of the body. Who should have screening? All adults who are 45-46 years old should have screening. Your health care provider may recommend screening before age 45. You will have tests every 1-10 years, depending on your results and the type of screening test. Screening recommendations for adults who are 76-85 years old vary depending on a person's health. People older than age 85 should no longer get colorectal cancer screening. You may have screening tests starting before age 45, or more often than other people, if you have any of these risk factors: A personal or family history of colorectal cancer or abnormal growths known as polyps in your colon. Inflammatory bowel disease, such as ulcerative colitis or Crohn's disease. A history of having radiation treatment to the abdomen or the area between the hip bones (pelvic area) for cancer. A type of genetic syndrome that is passed from parent to child (hereditary), such as: Lynch syndrome. Familial adenomatous polyposis. Turcot syndrome. Peutz-Jeghers syndrome. MUTYH-associated polyposis (MAP). A personal history of diabetes. Types of tests There are several types of colorectal screening tests. You may have one or more of the following: Guaiac-based fecal occult blood testing. For this test, a stool sample is checked for hidden (occult) blood, which could be a sign of colorectal cancer. Fecal immunochemical test (FIT). For this test, a stool sample is checked for blood, which could be a sign of colorectal cancer. Stool DNA test. For this test, a stool sample is checked for blood and changes in DNA that could lead to colorectal cancer. Sigmoidoscopy. During this test, a  thin, flexible tube with a camera on the end, called a sigmoidoscope, is used to examine the rectum and the lower colon. Colonoscopy. During this test, a long, flexible tube with a camera on the end, called a colonoscope, is used to examine the entire colon and rectum. Also, sometimes a tissue sample is taken to be looked at under a microscope (biopsy) or small polyps are removed during this test. Virtual colonoscopy. Instead of a colonoscope, this type of colonoscopy uses a CT scan to take pictures of the colon and rectum. A CT scan is a type of X-ray that is made using computers. What are the benefits of screening? Screening reduces your risk for colorectal cancer and can help identify cancer at an early stage, when the cancer can be removed or treated more easily. It is common for polyps to form in the lining of the colon, especially as you age. These polyps may be cancerous or become cancerous over time. Screening can identify these polyps. What are the risks of screening? Generally, these are safe tests. However, problems may occur, including: The need for more tests to confirm results from a stool sample test. Stool sample tests have fewer risks than other types of screening tests. Being exposed to low levels of radiation, if you had a test involving X-rays. This may slightly increase your cancer risk. The benefit of detecting cancer outweighs the slight increase in risk. Bleeding, damage to the intestine, or infection caused by a sigmoidoscopy or colonoscopy. A reaction to medicines given during a sigmoidoscopy or colonoscopy. Talk with your health care provider to understand your risk for colorectal cancer and to make a   screening plan that is right for you. Questions to ask your health care provider When should I start colorectal cancer screening? What is my risk for colorectal cancer? How often do I need screening? Which screening tests do I need? How do I get my test results? What do my  results mean? Where to find more information Learn more about colorectal cancer screening from: The American Cancer Society: cancer.org National Cancer Institute: cancer.gov Summary Colorectal cancer screening is a group of tests used to check for colorectal cancer before symptoms develop. All adults who are 45-46 years old should have screening. Your health care provider may recommend screening before age 45. You may have screening tests starting before age 45, or more often than other people, if you have certain risk factors. Screening reduces your risk for colorectal cancer and can help identify cancer at an early stage, when the cancer can be removed or treated more easily. Talk with your health care provider to understand your risk for colorectal cancer and to make a screening plan that is right for you. This information is not intended to replace advice given to you by your health care provider. Make sure you discuss any questions you have with your health care provider. Document Revised: 04/26/2019 Document Reviewed: 04/26/2019 Elsevier Patient Education  2024 Elsevier Inc.  

## 2023-08-18 DIAGNOSIS — E1065 Type 1 diabetes mellitus with hyperglycemia: Secondary | ICD-10-CM | POA: Diagnosis not present

## 2023-08-20 ENCOUNTER — Encounter: Payer: Self-pay | Admitting: Family Medicine

## 2023-08-23 DIAGNOSIS — G4733 Obstructive sleep apnea (adult) (pediatric): Secondary | ICD-10-CM | POA: Diagnosis not present

## 2023-08-23 DIAGNOSIS — Z1211 Encounter for screening for malignant neoplasm of colon: Secondary | ICD-10-CM | POA: Diagnosis not present

## 2023-08-25 ENCOUNTER — Other Ambulatory Visit: Payer: Self-pay | Admitting: Internal Medicine

## 2023-08-25 DIAGNOSIS — R748 Abnormal levels of other serum enzymes: Secondary | ICD-10-CM

## 2023-08-29 LAB — COLOGUARD: COLOGUARD: NEGATIVE

## 2023-08-30 ENCOUNTER — Ambulatory Visit: Payer: Self-pay | Admitting: Family Medicine

## 2023-08-30 ENCOUNTER — Ambulatory Visit
Admission: RE | Admit: 2023-08-30 | Discharge: 2023-08-30 | Disposition: A | Discharge status: Home / Self Care | Source: Ambulatory Visit | Attending: Internal Medicine | Admitting: Internal Medicine

## 2023-08-30 DIAGNOSIS — R748 Abnormal levels of other serum enzymes: Secondary | ICD-10-CM

## 2023-09-01 DIAGNOSIS — Z794 Long term (current) use of insulin: Secondary | ICD-10-CM | POA: Diagnosis not present

## 2023-09-01 DIAGNOSIS — Z8639 Personal history of other endocrine, nutritional and metabolic disease: Secondary | ICD-10-CM | POA: Diagnosis not present

## 2023-09-01 DIAGNOSIS — E1065 Type 1 diabetes mellitus with hyperglycemia: Secondary | ICD-10-CM | POA: Diagnosis not present

## 2023-09-01 DIAGNOSIS — E669 Obesity, unspecified: Secondary | ICD-10-CM | POA: Diagnosis not present

## 2023-09-01 DIAGNOSIS — E1042 Type 1 diabetes mellitus with diabetic polyneuropathy: Secondary | ICD-10-CM | POA: Diagnosis not present

## 2023-09-04 ENCOUNTER — Encounter: Payer: Self-pay | Admitting: Family Medicine

## 2023-09-06 ENCOUNTER — Other Ambulatory Visit: Payer: Self-pay | Admitting: Thoracic Surgery (Cardiothoracic Vascular Surgery)

## 2023-09-06 DIAGNOSIS — D171 Benign lipomatous neoplasm of skin and subcutaneous tissue of trunk: Secondary | ICD-10-CM

## 2023-09-07 NOTE — Progress Notes (Deleted)
       [address] [place] [address]             [phone]            [name] [id number] [birth year]   History of Present Illness: [name] is a 48 year old female with medical history of OSA, type 1 diabetes with neuropathy, anxiety and depression who returns for follow-up of combined intra and extrapleural resection of the chest wall mass on 11/28/2020.  Mass was a benign lipoma.  Initially she had paresthesia and numbness and was treated with Lyrica  25 mg twice daily.  She was able to discontinue this medication in July 2023.       Current Outpatient Medications on File Prior to Visit  Medication Sig Dispense Refill   ACCU-CHEK GUIDE test strip      atorvastatin  (LIPITOR) 10 MG tablet TAKE 1 TABLET BY MOUTH EVERYDAY AT BEDTIME 90 tablet 0   diclofenac  Sodium (VOLTAREN ) 1 % GEL Rub into affected area of knee 4 times daily prn (Patient not taking: Reported on 08/17/2023) 100 g 2   DULoxetine (CYMBALTA) 60 MG capsule Take 60 mg by mouth daily.     ibuprofen  (ADVIL ) 400 MG tablet Take 1 tablet (400 mg total) by mouth every 8 (eight) hours as needed for moderate pain. 90 tablet 0   insulin  aspart (NOVOLOG ) 100 UNIT/ML injection Inject 0-100 Units into the skin continuous. Via pump     insulin  glargine (LANTUS ) 100 UNIT/ML injection Inject 45 Units into the skin daily. Back up if insulin  pump not working     Insulin  Human (INSULIN  PUMP) SOLN Inject into the skin. Patient uses novolog  1.6 units/hr in insulin  pump.     losartan  (COZAAR ) 25 MG tablet TAKE 1 TABLET (25 MG TOTAL) BY MOUTH DAILY. 90 tablet 0   megestrol  (MEGACE ) 40 MG tablet Take 1 tablet (40 mg total) by mouth 2 (two) times daily. Can increase to two tablets twice a day in the event of heavy bleeding 60 tablet 5   No current facility-administered medications on file prior to visit.     ROS: ROS   LMP 08/04/2023   Physical Exam   Imaging:      A/P: Lipoma of  chest wall         Manuelita CHRISTELLA Rough, PA-C 09/07/23

## 2023-09-08 ENCOUNTER — Ambulatory Visit

## 2023-09-08 ENCOUNTER — Ambulatory Visit (HOSPITAL_COMMUNITY): Attending: Family Medicine

## 2023-09-15 DIAGNOSIS — K760 Fatty (change of) liver, not elsewhere classified: Secondary | ICD-10-CM | POA: Diagnosis not present

## 2023-09-15 DIAGNOSIS — E1042 Type 1 diabetes mellitus with diabetic polyneuropathy: Secondary | ICD-10-CM | POA: Diagnosis not present

## 2023-09-15 DIAGNOSIS — Z794 Long term (current) use of insulin: Secondary | ICD-10-CM | POA: Diagnosis not present

## 2023-09-15 DIAGNOSIS — Z8639 Personal history of other endocrine, nutritional and metabolic disease: Secondary | ICD-10-CM | POA: Diagnosis not present

## 2023-09-15 DIAGNOSIS — E1065 Type 1 diabetes mellitus with hyperglycemia: Secondary | ICD-10-CM | POA: Diagnosis not present

## 2023-09-15 DIAGNOSIS — E669 Obesity, unspecified: Secondary | ICD-10-CM | POA: Diagnosis not present

## 2023-09-17 ENCOUNTER — Ambulatory Visit

## 2023-09-17 ENCOUNTER — Other Ambulatory Visit: Payer: Self-pay | Admitting: Thoracic Surgery (Cardiothoracic Vascular Surgery)

## 2023-09-17 DIAGNOSIS — R222 Localized swelling, mass and lump, trunk: Secondary | ICD-10-CM

## 2023-09-21 ENCOUNTER — Ambulatory Visit
Admission: RE | Admit: 2023-09-21 | Discharge: 2023-09-21 | Disposition: A | Discharge status: Home / Self Care | Source: Ambulatory Visit | Attending: Cardiology | Admitting: Cardiology

## 2023-09-21 ENCOUNTER — Ambulatory Visit (HOSPITAL_COMMUNITY)

## 2023-09-21 ENCOUNTER — Ambulatory Visit (INDEPENDENT_AMBULATORY_CARE_PROVIDER_SITE_OTHER)

## 2023-09-21 VITALS — BP 140/76 | HR 96 | Resp 20 | Ht 67.0 in | Wt 249.0 lb

## 2023-09-21 DIAGNOSIS — R222 Localized swelling, mass and lump, trunk: Secondary | ICD-10-CM | POA: Insufficient documentation

## 2023-09-21 DIAGNOSIS — D171 Benign lipomatous neoplasm of skin and subcutaneous tissue of trunk: Secondary | ICD-10-CM | POA: Insufficient documentation

## 2023-09-21 NOTE — Patient Instructions (Signed)
-   Follow-up in 1 year with CT of chest for continued surveillance of recurrence.

## 2023-09-21 NOTE — Progress Notes (Signed)
 [address] [place] [address]             [phone]            Barbara Clark [id number] [birth year]   History of Present Illness: [name] is a 48 year old female with medical history of OSA, type 1 diabetes with neuropathy, anxiety and depression who returns for follow-up of combined intra and extrapleural resection of the chest wall mass on 11/28/2020.  Mass was a benign lipoma.  Initially she had paresthesia and numbness and was treated with Lyrica  25 mg twice daily.  She was able to discontinue this medication in July 2023.  She was seen a year ago in clinic and imaging showed no reoccurrence of lipoma. She presents today for continued follow up.  She reports that she is doing well.  Chest wall lipoma was found after having a MRI scan for chronic cough. The cough resolved after removal of lipoma. She denies current cough, chest pain, chest tightness and shortness of breath.      Current Outpatient Medications on File Prior to Visit  Medication Sig Dispense Refill   ACCU-CHEK GUIDE test strip      atorvastatin  (LIPITOR) 10 MG tablet TAKE 1 TABLET BY MOUTH EVERYDAY AT BEDTIME 90 tablet 0   diclofenac  Sodium (VOLTAREN ) 1 % GEL Rub into affected area of knee 4 times daily prn 100 g 2   insulin  aspart (NOVOLOG ) 100 UNIT/ML injection Inject 0-100 Units into the skin continuous. Via pump     insulin  glargine (LANTUS ) 100 UNIT/ML injection Inject 45 Units into the skin daily. Back up if insulin  pump not working     Insulin  Human (INSULIN  PUMP) SOLN Inject into the skin. Patient uses novolog  1.6 units/hr in insulin  pump.     losartan  (COZAAR ) 25 MG tablet TAKE 1 TABLET (25 MG TOTAL) BY MOUTH DAILY. 90 tablet 0   DULoxetine (CYMBALTA) 60 MG capsule Take 60 mg by mouth daily.     ibuprofen  (ADVIL ) 400 MG tablet Take 1 tablet (400 mg total) by mouth every 8 (eight) hours as needed for moderate pain. 90 tablet 0   megestrol  (MEGACE ) 40 MG tablet  Take 1 tablet (40 mg total) by mouth 2 (two) times daily. Can increase to two tablets twice a day in the event of heavy bleeding 60 tablet 5   No current facility-administered medications on file prior to visit.     ROS: Review of Systems  Constitutional: Negative.  Negative for fever and malaise/fatigue.  Respiratory: Negative.  Negative for cough, shortness of breath and wheezing.   Cardiovascular: Negative.  Negative for chest pain, palpitations and leg swelling.     BP (!) 140/76   Pulse 96   Resp 20   Ht 5' 7 (1.702 m)   Wt 249 lb (112.9 kg)   SpO2 96% Comment: RA  BMI 39.00 kg/m   Physical Exam Constitutional:      Appearance: Normal appearance.  HENT:     Head: Normocephalic and atraumatic.  Cardiovascular:     Rate and Rhythm: Normal rate and regular rhythm.     Heart sounds: Normal heart sounds, S1 normal and S2 normal.  Pulmonary:     Effort: Pulmonary effort is normal.     Breath sounds: Normal breath sounds.  Musculoskeletal:     Cervical back: Normal range of motion.  Skin:    General: Skin is warm and dry.  Neurological:     General: No focal deficit present.     Mental Status: She is alert.      Imaging: EXAM: 2 VIEW(S) XRAY OF THE CHEST 09/21/2023 02:08:47 PM   COMPARISON: 09/08/2022   CLINICAL HISTORY: Chest wall lipoma benign. Chest wall lipoma.   FINDINGS:   LUNGS AND PLEURA: No focal pulmonary opacity. No pulmonary edema. No pleural effusion. No pneumothorax.   HEART AND MEDIASTINUM: No acute abnormality of the cardiac and mediastinal silhouettes.   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   IMPRESSION: 1. No acute findings.   Electronically signed by: Rockey Kilts MD 09/21/2023 03:08 PM EDT RP Workstation: [id number]     A/P: Lipoma of chest wall -We discussed today's chest x-ray.  The chest wall lipoma was benign and completely resected although there  was some lipoma present at inked margins on the specimen.  There is currently no evidence of recurrence.  - Scheduled follow-up in 1 year with a CT scan of chest to monitor for recurrence      Manuelita CHRISTELLA Rough, PA-C 09/21/23

## 2023-09-24 DIAGNOSIS — E1065 Type 1 diabetes mellitus with hyperglycemia: Secondary | ICD-10-CM | POA: Diagnosis not present

## 2023-10-06 NOTE — Progress Notes (Signed)
 HPI female Smoker followed for OSA, complicated by HTN, Pleural Lipoma resected, Loculated R Pleural Effusion,  DM 1/neuropathy, DeQuervain's Tenosynovitis, obesity, depression/history conversion Disorder HST 10/15/20- AHI 26/ hr, desaturation to 78%, body weight 255 lbs HST 06/22/22- AHI 49/ hr, desaturation to 74%, body weight 243 lbs ==============================================================  02/12/23- 48 yo female former Smoker followed for OSA, complicated by HTN, Pleural Lipoma resected 2022, Loculated R Pleural Effusion,  DM 1/neuropathy, DeQuervain's Tenosynovitis, obesity, depression/history conversion Disorder CPAP 4-10/ [employer]   [employer]  ([employer] download) Download compliance   16%, AHI 1/hr    Body weight today- 245Lbs Discussed the use of AI scribe software for clinical note transcription with the patient, who gave verbal consent to proceed.  History of Present Illness   The patient, with a history of sleep apnea, presents with difficulty tolerating her CPAP mask due to feelings of claustrophobia. She has tried both the nasal pillows and the full face mask, but has been unable to consistently use either due to discomfort and difficulty breathing. She has been using an old mask, but reports that it is not effective and she often removes it during the night due to inability to breathe. She reports occasional shortness of breath during the day, but denies coughing or wheezing. She had a clear chest x-ray in August. .She is due to see her surgeon next month for a follow-up after chest wall lipoma removal.    CXR 09/08/22 IMPRESSION: No acute cardiopulmonary process.  Assessment and Plan:    Obstructive Sleep Apnea   Patient reports discomfort with current CPAP mask leading to inconsistent use. No coughing or wheezing. Lungs sound clear on examination.   -Request home care company to refit patient with a mask of her choice.   -Encourage consistent use of CPAP for optimal  benefit.   -Consider alternatives to CPAP if consistent use cannot be achieved with new mask.  Lipoma- pleural -follow up with Thoracic Surgery as planned      10/07/23-  48 yo female former Smoker followed for OSA, complicated by HTN, Pleural Lipoma resected, Loculated R Pleural Effusion, Tobacco use,  DM 1/neuropathy, DeQuervain's Tenosynovitis, obesity, depression/history conversion Disorder HST 06/22/22- AHI 49/ hr, desaturation to 74%, body weight 243 lbs CPAP 5-20/ [employer]   [employer]  ([employer] download) Download compliance  Body weight today- -----Not using CPAP due to mold exposure x 1-2 months Discussed the use of AI scribe software for clinical note transcription with the patient, who gave verbal consent to proceed.  History of Present Illness   Sachi Boulay is a 48 year old female with sleep apnea who presents with difficulty using CPAP due to mold issues in her home.  She has been unable to use her CPAP machine for a couple of months due to mold in her home, which she believes she is inhaling while using the machine. She shows pictures on her phone she believes indicate mold staining on headgear and mask.  Her home has high humidity and mold issues, affecting her sleep and overall comfort. Her sleep is inconsistent, with some nights of adequate rest and others disrupted by stress and environmental factors. She attributes some of her sleep disturbances to stress from her job and concerns about her living environment, particularly with the presence of her newborn grandson in the house. No breathing difficulties have been experienced despite not using the CPAP machine. Her social history includes significant stress related to her living situation, job, and family, including the recent  birth of her first grandson, who lives with her. She is in the process of trying to purchase a new home to improve her living conditions. We discussed interval use of a dehumidifier. We discussed  alternatives and can refer her to learn about fitted oral appliance therapy.     Assessment and Plan:    Obstructive sleep apnea Obstructive sleep apnea with CPAP intolerance due to mold exposure. Sleep quality affected by stress and environment. Discussed fitted mouthpieces and Inspire therapy. She is not comfortable with surgery, making Inspire less suitable. - Refer to a specialist for fitted mouthpiece evaluation.   Environmental mod exposure - Discussed use of dehumidifier to reduce home mold.  -She is seeking a new home and has met with landlord   CXR 09/21/23- LUNGS AND PLEURA: No focal pulmonary opacity. No pulmonary edema. No pleural effusion. No pneumothorax. HEART AND MEDIASTINUM: No acute abnormality of the cardiac and mediastinal silhouettes. 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 IMPRESSION: 1. No acute findings.   ROS-see HPI   + = positive Constitutional:    weight loss, night sweats, fevers, chills, +fatigue, lassitude. HEENT:    headaches, difficulty swallowing, tooth/dental problems, sore throat,       sneezing, itching, ear ache, nasal congestion, post nasal drip, snoring CV:    chest pain, orthopnea, PND, swelling in lower extremities, anasarca,                                   dizziness, palpitations Resp:  + shortness of breath with exertion or at rest.                productive cough,   non-productive cough, coughing up of blood.              change in color of mucus.  wheezing.   Skin:    rash or lesions. GI:  No-   heartburn, indigestion, abdominal pain, nausea, vomiting, diarrhea,                 change in bowel habits, loss of appetite GU: dysuria, change in color of urine, no urgency or frequency.   flank pain. MS:   joint pain, stiffness, decreased range of motion, back pain. Neuro-     nothing unusual Psych:  change in mood or affect. + depression or anxiety.   memory loss.  OBJ-  Physical Exam General- Alert, Oriented, Affect-appropriate, Distress- none acute, + obese,  Skin- rash-none, lesions- none, excoriation- none Lymphadenopathy- none Head- atraumatic            Eyes- Gross vision intact, PERRLA, conjunctivae and secretions clear            Ears- Hearing, canals-normal            Nose- Clear, no-Septal dev, mucus, polyps, erosion, perforation             Throat- Mallampati IV , mucosa clear , drainage- none, tonsils- atrophic, + teeth Neck- flexible , trachea midline, no stridor , thyroid  nl, carotid no bruit Chest - symmetrical excursion , unlabored           Heart/CV- RRR , no murmur , no gallop  , no rub, nl s1 s2                           -  JVD- none , edema- none, stasis changes- none, varices- none           Lung- clear to P&A, wheeze- none, cough- none , dullness-none, rub- none           Chest wall-  Abd-  Br/ Gen/ Rectal- Not done, not indicated Extrem- cyanosis- none, clubbing, none, atrophy- none, strength- nl Neuro- grossly intact to observation

## 2023-10-07 ENCOUNTER — Encounter: Payer: Self-pay | Admitting: Internal Medicine

## 2023-10-07 ENCOUNTER — Ambulatory Visit (INDEPENDENT_AMBULATORY_CARE_PROVIDER_SITE_OTHER): Payer: MEDICAID | Admitting: Internal Medicine

## 2023-10-07 VITALS — BP 128/68 | HR 81 | Temp 98.6°F | Ht 67.0 in | Wt 245.4 lb

## 2023-10-07 DIAGNOSIS — G4733 Obstructive sleep apnea (adult) (pediatric): Secondary | ICD-10-CM | POA: Diagnosis not present

## 2023-10-07 DIAGNOSIS — Z87891 Personal history of nicotine dependence: Secondary | ICD-10-CM

## 2023-10-07 NOTE — Patient Instructions (Signed)
 Order- referral to Orthodontist Dr Oneil Forget   consider oral appliance for OSA  Suggest you look into getting a dehumidifier  for your home to reduce the humidity. That will discourage mold growth.

## 2023-10-10 ENCOUNTER — Encounter: Payer: Self-pay | Admitting: Internal Medicine

## 2023-10-19 DIAGNOSIS — Z1211 Encounter for screening for malignant neoplasm of colon: Secondary | ICD-10-CM | POA: Diagnosis not present

## 2023-10-19 DIAGNOSIS — K760 Fatty (change of) liver, not elsewhere classified: Secondary | ICD-10-CM | POA: Diagnosis not present

## 2023-10-19 DIAGNOSIS — R748 Abnormal levels of other serum enzymes: Secondary | ICD-10-CM | POA: Diagnosis not present

## 2023-10-25 DIAGNOSIS — E1065 Type 1 diabetes mellitus with hyperglycemia: Secondary | ICD-10-CM | POA: Diagnosis not present

## 2023-11-01 ENCOUNTER — Other Ambulatory Visit: Payer: Self-pay

## 2023-11-01 ENCOUNTER — Other Ambulatory Visit: Payer: Self-pay | Admitting: Family Medicine

## 2023-11-01 ENCOUNTER — Encounter: Payer: Self-pay | Admitting: Internal Medicine

## 2023-11-01 DIAGNOSIS — G4733 Obstructive sleep apnea (adult) (pediatric): Secondary | ICD-10-CM

## 2023-11-04 ENCOUNTER — Ambulatory Visit

## 2023-11-04 VITALS — BP 118/75 | HR 88 | Temp 98.2°F | Wt 237.6 lb

## 2023-11-04 DIAGNOSIS — U071 COVID-19: Secondary | ICD-10-CM

## 2023-11-04 DIAGNOSIS — J029 Acute pharyngitis, unspecified: Secondary | ICD-10-CM | POA: Diagnosis not present

## 2023-11-04 LAB — POC SOFIA 2 FLU + SARS ANTIGEN FIA
Influenza A, POC: NEGATIVE
Influenza B, POC: NEGATIVE
SARS Coronavirus 2 Ag: POSITIVE — AB

## 2023-11-04 LAB — POCT RAPID STREP A (OFFICE): Rapid Strep A Screen: NEGATIVE

## 2023-11-04 MED ORDER — PAXLOVID (300/100) 20 X 150 MG & 10 X 100MG PO TBPK: 3.0000 | ORAL_TABLET | Freq: Two times a day (BID) | ORAL | 0 refills | Status: AC

## 2023-11-04 NOTE — Patient Instructions (Signed)
 Good to see you today - Thank you for coming in  Things we discussed today:  You tested positive for COVID.  Unfortunately this virus will have to run its course.  You can take Tylenol  over-the-counter for persistent pain.  I recommend staying hydrated, and is very important that you continue to eat and drink because of your diabetes.  If you develop chest pain, shortness of breath, or other are unable to tolerate food or drink please go to the emergency room.

## 2023-11-04 NOTE — Progress Notes (Signed)
    SUBJECTIVE:   CHIEF COMPLAINT / HPI:   Sore throat x 4 days Dry cough, fevers (Tmax 103 yesterday), decreased appetite and generalized not feeling well No known sick contacts, works at a college Denies shortness of breath, chest pain, ear pain Has insulin  pump for T1DM Has taken Tylenol  and theraflu with minimal improvement Has not received flu shot  PERTINENT  PMH / PSH: OSA, T1DM, MDD, stress incontinence   OBJECTIVE:   BP 118/75   Pulse 88   Temp 98.2 F (36.8 C)   Wt 237 lb 9.6 oz (107.8 kg)   SpO2 100%   BMI 37.21 kg/m   General: no acute distress HEENT: Bilateral Tms clear, landmarks visualized. Nasal congestion bilaterally. Posterior oropharynx erythematous without exudate.  Cardiovascular: RRR, no m/r/g Respiratory: normal work of breathing on RA, CTAB, no wheezes or focal findings  ASSESSMENT/PLAN:   Assessment & Plan COVID Tested positive for SARS coronavirus 2. Flu, strep negative. Rx paxlovid given within 5 day window of symptom onset and T1DM. Advised to not take lipitor today.  Supportive care, discussed importance of PO intake ED precautions discussed     Elyce Prescott, DO [facility]

## 2023-11-05 ENCOUNTER — Encounter: Payer: Self-pay | Admitting: Family Medicine

## 2023-11-05 ENCOUNTER — Other Ambulatory Visit: Payer: Self-pay | Admitting: Family Medicine

## 2023-11-08 NOTE — Telephone Encounter (Signed)
 Called patient.   She reports today is the last day of taking Paxlovid, the course will be finished.   She reports over the weekend her sugars have been running in the mid 300s.  She reports right now her cbg is 144.  She denies any blurry vision, vision changes or weakness.  She reports she did run a fever most of the weekend, however she report she is starting to feel better today.   Patient scheduled for tomorrow for FU with PCP.  Strict ED precautions discussed for worsening symptoms.

## 2023-11-09 ENCOUNTER — Encounter: Payer: Self-pay | Admitting: Family Medicine

## 2023-11-09 ENCOUNTER — Other Ambulatory Visit: Payer: Self-pay | Admitting: Family Medicine

## 2023-11-09 ENCOUNTER — Ambulatory Visit (INDEPENDENT_AMBULATORY_CARE_PROVIDER_SITE_OTHER): Admitting: Family Medicine

## 2023-11-09 VITALS — BP 123/84 | HR 89 | Ht 67.0 in | Wt 236.8 lb

## 2023-11-09 DIAGNOSIS — R509 Fever, unspecified: Secondary | ICD-10-CM | POA: Diagnosis not present

## 2023-11-09 DIAGNOSIS — E1040 Type 1 diabetes mellitus with diabetic neuropathy, unspecified: Secondary | ICD-10-CM | POA: Diagnosis not present

## 2023-11-09 DIAGNOSIS — U071 COVID-19: Secondary | ICD-10-CM | POA: Diagnosis not present

## 2023-11-09 MED ORDER — IBUPROFEN 400 MG PO TABS: 400.0000 mg | ORAL_TABLET | Freq: Three times a day (TID) | ORAL | 0 refills | Status: AC | PRN

## 2023-11-09 NOTE — Assessment & Plan Note (Addendum)
 Type 1 diabetes managed with insulin  pump. Recent insulin  adjustment due to Paxlovid reducing insulin  effectiveness. Current glucose level is 169 mg/dL. Insurance change led to switch from Novolog  to another insulin . She is aware of how to adjust insulin  pump as needed. - Maintain current insulin  regimen. - Follow up with endocrinologist on November 1st or 2nd.

## 2023-11-09 NOTE — Patient Instructions (Signed)
 COVID-19: What to Know COVID-19 is an infection caused by a virus called SARS-CoV-2. This type of virus is called a coronavirus. People with COVID-19 may: Have few to no symptoms. Have mild to moderate symptoms that affect their lungs and breathing. Get very sick. What are the causes?  COVID-19 is caused by a virus. This virus may be in the air as droplets or on surfaces. It can spread from an infected person when they cough, sneeze, speak, sing, or breathe. You may become infected if: You breathe in the infected droplets in the air. You touch an object that has the virus on it. What increases the risk? You are at risk of getting COVID-19 if you have been around someone with the infection. You may be more likely to get very sick if: You are 57 years old or older. You have certain medical conditions, such as: Heart disease. Diabetes. Long-term respiratory disease. Cancer. Pregnancy. You are immunocompromised. This means your body can't fight infections easily. You have a disability that makes it hard for you to move around, you have trouble moving, or you can't move at all. What are the signs or symptoms? People may have different symptoms from COVID-19. The symptoms can also be mild to very bad. They often show up in 5-6 days after being infected. But, they can take up to 14 days to appear. Common symptoms are: Cough. Feeling tired. New loss of taste or smell. Fever. Less common symptoms are: Sore throat. Headache. Body or muscle aches. Diarrhea. A skin rash or fingers or toes that are a different color than usual. Red or irritated eyes. Sometimes, COVID-19 does not cause symptoms. How is this diagnosed? COVID-19 can be diagnosed with tests done in the lab or at home. Fluid from your nose, mouth, or lungs will be used to check for the virus. How is this treated? Treatment for COVID-19 depends on how sick you are. Mild symptoms can be treated at home with rest, fluids, and  over-the-counter medicines. very bad symptoms may be treated in a hospital intensive care unit (ICU). If you have symptoms and are at risk of getting very sick, you may be given a medicine that fights viruses. This medicine is called an antiviral. How is this prevented? To protect yourself from COVID-19: Know your risk factors. Get vaccinated. If your body can't fight infections easily, talk to your provider about treatment to help prevent COVID-19. Stay at least about 3 feet (1 meter) away from other people. Wear mask that fits well when: You can't stay at a distance from people. You're in a place with not a lot of air flow. Try to be in open spaces with good air flow when you are in public. Wash your hands often or use an alcohol-based hand sanitizer. Cover your nose and mouth when you cough or sneeze. If you think you have COVID-19 or have been around someone who has it, stay home and away from other people as told by your provider or health officials. Where to find more information To learn more: Go to [employer]Prom[employer].no Click Health Topics. Type COVID-19 in the search box. Go to VisitDestination.com.br Click Health Topics. Then click All Topics. Type COVID-19 in the search box. Get help right away if: You have trouble breathing or get short of breath. You have pain or pressure in your chest. You're feeling confused. These symptoms may be an emergency. Get help right away. Call 911. Do not wait to see if the symptoms will go away.  Do not drive yourself to the hospital. This information is not intended to replace advice given to you by your health care provider. Make sure you discuss any questions you have with your health care provider. Document Revised: 10/08/2022 Document Reviewed: 09/30/2022 Elsevier Patient Education  2025 [employer].

## 2023-11-09 NOTE — Progress Notes (Signed)
    SUBJECTIVE:   CHIEF COMPLAINT / HPI:   Discussed the use of AI scribe software for clinical note transcription with the patient, who gave verbal consent to proceed.  History of Present Illness   Barbara Clark is a 48 year old female with diabetes who presents with persistent COVID-19 symptoms.  She was diagnosed with COVID-19 last week and completed a course of Paxlovid yesterday. Despite this, she continues to experience symptoms including fever, diarrhea, cough, sneezing, and throat pain. Her fever reached 101F early this morning, and she describes her throat as 'on fire' since the onset of illness. She has been using cough drops and eating Jell-O to soothe her throat. Her symptoms have been fluctuating, with periods of improvement followed by worsening.  She has diabetes managed with an insulin  pump. During her COVID-19 treatment, she adjusted her insulin  pump to increase insulin  delivery due to elevated blood sugar levels. Her current blood sugar is 169 mg/dL. She is no longer on Novolog  due to insurance issues and is using a different insulin  brand, though unspecified. DM managed by her endocrinologist.  She has been taking Tylenol  for fever, but it has not been effective. She cannot use Nyquil due to its impact on her blood sugar and found Theraflu ineffective.  She is also a grandmother to a six-month-old grandson and is cautious about contact due to her illness. She is concerned about mold in her current home, which she believes may be affecting her health, and is in the process of purchasing a new home.       PERTINENT  PMH / PSH: PMHx reviewed  OBJECTIVE:   BP 123/84   Pulse 89   Wt 236 lb 12.8 oz (107.4 kg)   LMP 10/28/2023   SpO2 98%   BMI 37.09 kg/m   Physical Exam   VITALS: BP- 123/84 GEN: No distress. Mildly ill-appearing CHEST: Lungs clear to auscultation, no wheezing, no crackles. CARDIOVASCULAR: Heart sounds normal, no murmurs.     Results   LABS    COVID-19: positive   Influenza: negative   Glucose: 169 mg/dL (89/78/7974)       ASSESSMENT/PLAN:   Assessment & Plan DM neuropathy, type I diabetes mellitus (HCC) Type 1 diabetes managed with insulin  pump. Recent insulin  adjustment due to Paxlovid reducing insulin  effectiveness. Current glucose level is 169 mg/dL. Insurance change led to switch from Novolog  to another insulin . She is aware of how to adjust insulin  pump as needed. - Maintain current insulin  regimen. - Follow up with endocrinologist on November 1st or 2nd. COVID-19 virus infection COVID-19 infection confirmed by positive test. Completed Paxlovid treatment but experiencing rebound symptoms including cough, sneezing, and intermittent breathing difficulties. Symptoms may take days to weeks to resolve. - Advise rest at home. - Provide work letter to stay home for one week. - Instruct to return to work on October 28th. - Schedule follow-up appointment on November 7th. Fever, unspecified fever cause Fever and associated symptoms (diarrhea, sore throat) secondary to COVID-19 infection Persistent fever with a temperature of 101F, sore throat, and diarrhea. Tylenol  ineffective. Negative flu test. Symptoms likely secondary to COVID-19 infection. - Prescribe ibuprofen  400 mg every 6-8 hours as needed for fever and pain. - Advise warm saline gargles for sore throat.    Otto Fairly, MD [facility]

## 2023-11-15 ENCOUNTER — Ambulatory Visit: Admitting: Family Medicine

## 2023-11-15 ENCOUNTER — Encounter: Payer: Self-pay | Admitting: Family Medicine

## 2023-11-15 VITALS — BP 124/76 | Temp 98.4°F | Ht 67.0 in | Wt 243.0 lb

## 2023-11-15 DIAGNOSIS — R197 Diarrhea, unspecified: Secondary | ICD-10-CM

## 2023-11-15 DIAGNOSIS — R059 Cough, unspecified: Secondary | ICD-10-CM

## 2023-11-15 DIAGNOSIS — J019 Acute sinusitis, unspecified: Secondary | ICD-10-CM | POA: Diagnosis present

## 2023-11-15 LAB — POC SOFIA 2 FLU + SARS ANTIGEN FIA
Influenza A, POC: NEGATIVE
Influenza B, POC: NEGATIVE
SARS Coronavirus 2 Ag: NEGATIVE

## 2023-11-15 MED ORDER — AMOXICILLIN-POT CLAVULANATE 875-125 MG PO TABS: 1.0000 | ORAL_TABLET | Freq: Two times a day (BID) | ORAL | 0 refills | Status: AC

## 2023-11-15 NOTE — Progress Notes (Signed)
    SUBJECTIVE:   CHIEF COMPLAINT / HPI: cough, sneezing, diarrhea  Sick symptoms ongoing since 10/19 Was positive for COVID and treated with paxlovid initially Initially had some improvement but since Saturday 10/25 symptoms have returned Primarily cough, sneezing, some congestion, and diarrhea No blood in her stool.  No significant abdominal pain.  Has not checked her temperature, does not think that she has had any fevers Denies any difficulty breathing, chest pain.  Denies headache, vision changes, numbness or weakness  Has been able to remain hydrated but eating a little bit less than normal.  Has not been vomiting at all.  Denies nausea.  PERTINENT  PMH / PSH: OSA, T1DM. Recent positive COVID for which she was treated with paxlovid  OBJECTIVE:   BP 124/76   Temp 98.4 F (36.9 C)   Ht 5' 7 (1.702 m)   Wt 243 lb (110.2 kg)   LMP 10/28/2023   SpO2 99%   BMI 38.06 kg/m    General: NAD, pleasant, able to participate in exam HEENT: TTP b/l frontal and maxillary sinuses Cardiac: RRR, no murmurs auscultated Respiratory: CTAB, normal WOB Abdomen: soft, non-tender, non-distended, normoactive bowel sounds Extremities: warm and well perfused, no edema or cyanosis Skin: warm and dry, no rashes noted Neuro: alert, no obvious focal deficits, speech normal Psych: Normal affect and mood  ASSESSMENT/PLAN:    Assessment & Plan Acute sinusitis, recurrence not specified, unspecified location Cough, unspecified type Diarrhea of presumed infectious origin Given duration of symptoms with initial improvement and now worsening of symptoms with tenderness of sinuses on exam, will treat for acute bacterial sinusitis with Augmentin  Can also try OTC Imodium for diarrhea - suspect this may be related to prior covid infection. No red flags in history or exam Recommended continued hydration Discussed supportive care and return precautions COVID/flu neg today   Payton Coward, MD [facility]

## 2023-11-15 NOTE — Patient Instructions (Addendum)
 Take Augmentin  as prescribed for sinus infection  Try Imodium over-the-counter for diarrhea  Covid/flu negative

## 2023-11-15 NOTE — Addendum Note (Signed)
 Addended byBETHA COWARD, Briunna Leicht on: 11/15/2023 04:46 PM   Modules accepted: Level of Service

## 2023-11-25 DIAGNOSIS — E1065 Type 1 diabetes mellitus with hyperglycemia: Secondary | ICD-10-CM | POA: Diagnosis not present

## 2023-11-26 ENCOUNTER — Ambulatory Visit: Payer: Self-pay

## 2023-11-26 DIAGNOSIS — Z23 Encounter for immunization: Secondary | ICD-10-CM

## 2023-11-26 NOTE — Progress Notes (Signed)
 Patient presents to nurse clinic for flu vaccination. Administered in LD without complication.   Discussed with patient COVID and Hep B vaccination. Patient recently had positive COVID test on 11/04/23. Discussed with patient delaying to 90 days after positive test. Patient agrees to waiting for COVID vaccine.   I also discussed the Hepatitis B vaccine with Dr. Anders. Given patient's prior labs in 2022 and repeat vaccine on 08/17/23, patient does not need additional dose. Informed patient of this during visit.   Chiquita JAYSON English, RN

## 2023-11-29 ENCOUNTER — Encounter: Payer: Self-pay | Admitting: Family Medicine

## 2023-11-30 ENCOUNTER — Telehealth: Payer: Self-pay | Admitting: Family Medicine

## 2023-11-30 NOTE — Telephone Encounter (Deleted)
 Pt Daughter came to drop off an envelope for you will place in your box.

## 2023-11-30 NOTE — Telephone Encounter (Signed)
 error

## 2023-12-24 ENCOUNTER — Encounter: Payer: Self-pay | Admitting: Family Medicine

## 2023-12-24 ENCOUNTER — Telehealth: Admitting: Family Medicine

## 2023-12-24 DIAGNOSIS — J069 Acute upper respiratory infection, unspecified: Secondary | ICD-10-CM

## 2023-12-24 MED ORDER — ONDANSETRON HCL 4 MG PO TABS: 4.0000 mg | ORAL_TABLET | Freq: Three times a day (TID) | ORAL | 0 refills | Status: AC | PRN

## 2023-12-24 NOTE — Progress Notes (Signed)
 [facility] Telemedicine Visit  Patient consented to have virtual visit and was identified by name and date of birth. Method of visit: Video  Encounter participants: Patient: Barbara Clark - located at her home Provider: Lucie Pinal - located at [facility]  Chief Complaint: Cough, sore throat  HPI:  Patient's grandchild visited recently and later came down with very similar symptoms.  She has had several days now of cough, sore throat and generalized bodyaches.  She denies any fevers but does endorse nausea and has vomited 3 times.  She is able to keep down fluids but has little appetite.  No shortness of breath, no difficulty breathing.  ROS: per HPI  Pertinent PMHx: OSA  Exam:  There were no vitals taken for this visit.  Respiratory: Even, unlabored breathing.  Audible upper respiratory sounds with productive sounding cough.  Patient is able to speak in full sentences without becoming short of breath Assessment & Plan Viral URI with cough - Patient with multiple sick contacts now presenting with symptoms of viral illness -Advised ongoing supportive care including adequate hydration, over-the-counter cough and cold remedies, and rest -Informed patient of return precautions including symptoms persisting past the middle of next week, high fever, inability to eat or drink anything, shortness of breath or difficulty speaking in complete sentences -Patient voiced understanding and agreement with the plan   Time spent during visit with patient: 10 minutes

## 2023-12-27 DIAGNOSIS — E1065 Type 1 diabetes mellitus with hyperglycemia: Secondary | ICD-10-CM | POA: Diagnosis not present

## 2024-01-28 ENCOUNTER — Encounter: Payer: Self-pay | Admitting: Student

## 2024-01-28 ENCOUNTER — Ambulatory Visit: Admitting: Student

## 2024-01-28 ENCOUNTER — Encounter: Payer: Self-pay | Admitting: Family Medicine

## 2024-01-28 ENCOUNTER — Ambulatory Visit: Payer: Self-pay | Admitting: Student

## 2024-01-28 VITALS — BP 117/69 | HR 93 | Ht 67.0 in | Wt 246.0 lb

## 2024-01-28 DIAGNOSIS — N3001 Acute cystitis with hematuria: Secondary | ICD-10-CM

## 2024-01-28 DIAGNOSIS — L729 Follicular cyst of the skin and subcutaneous tissue, unspecified: Secondary | ICD-10-CM

## 2024-01-28 DIAGNOSIS — R8290 Unspecified abnormal findings in urine: Secondary | ICD-10-CM

## 2024-01-28 LAB — POCT URINALYSIS DIP (MANUAL ENTRY)
Bilirubin, UA: NEGATIVE
Glucose, UA: NEGATIVE mg/dL
Ketones, POC UA: NEGATIVE mg/dL
Leukocytes, UA: NEGATIVE
Nitrite, UA: POSITIVE — AB
Protein Ur, POC: NEGATIVE mg/dL
Spec Grav, UA: 1.025
Urobilinogen, UA: 1 U/dL
pH, UA: 5.5

## 2024-01-28 LAB — POCT UA - MICROSCOPIC ONLY
RBC, Urine, Miroscopic: >20
WBC, Ur, HPF, POC: NONE SEEN

## 2024-01-28 MED ORDER — NITROFURANTOIN MONOHYD MACRO 100 MG PO CAPS: 100.0000 mg | ORAL_CAPSULE | Freq: Two times a day (BID) | ORAL | 0 refills | Status: AC

## 2024-01-28 NOTE — Progress Notes (Cosign Needed)
" ° ° °  SUBJECTIVE:   CHIEF COMPLAINT / HPI:   Cyst Patient reports recurrent drainage from  pimple on her back.  Additionally, concerning for a large lump.  She wakes up every morning with drainage on her clothing and a foul odor.  No systemic symptoms.  Urinary odor Patient has concern for urinary odor.  Additionally she has increased frequency.  No dysuria.  Prior history of UTIs.  OBJECTIVE:   BP 117/69   Pulse 93   Ht 5' 7 (1.702 m)   Wt 246 lb (111.6 kg)   LMP 01/27/2024   SpO2 98%   BMI 38.53 kg/m    General: NAD, pleasant Cardio: RRR, no MRG.  Respiratory: CTAB, normal wob on RA Skin: ~2x3 cm cyst on patient's back, located on the right side underneath bra line.  Exam performed with chaperone Alexis CMA.  PROCEDURE NOTE:    Incision and Drainage Discussed risks/benefits of procedure, and patient gave consent for excision of cyst.  Prepped skin in usual sterile fashion.  Used 1 cc of 1% lidocaine  with epinephrine  for local anesthesia, #11 blade to make a linear incision, and forceps to break up consolidations. Drained sebaceous material with some sanguinous fluid. Unable to remove cyst wall. Incision was not packed, and was covered with gauze. Patient tolerated procedure well.  Discussed wound care.  Supervising Preceptor: Dr. Donah   ASSESSMENT/PLAN:   Assessment & Plan Cyst of skin - Incision and drainage today, see procedure note above - Unable to remove cyst wall, referral to general surgery Acute cystitis with hematuria - UA/microscopy consistent with urine tract infection - Reviewed prior culture, pansensitive except for amoxicillin . - Sent Macrobid  100 mg twice daily for 5 days - Patient notified through MyChart, which was her preference     Gladis Church, DO [facility] "

## 2024-01-28 NOTE — Patient Instructions (Signed)
 It was great to see you! Thank you for allowing me to participate in your care!   I recommend that you always bring your medications to each appointment as this makes it easy to ensure we are on the correct medications and helps us  not miss when refills are needed.   Incision and Drainage, Care After After incision and drainage, it is common to have: Pain or discomfort around the incision site. Blood, fluid, or pus (drainage) from the incision. Redness and firm skin around the incision site. Follow these instructions at home: Medicines Take over-the-counter and prescription medicines only as told by your health care provider. If you were prescribed antibiotics, take them as told by your provider. Do not stop using the antibiotic even if you start to feel better. Do not apply creams, ointments, or liquids unless you have been told to by your provider. Wound care Follow instructions from your provider about how to take care of your wound. Make sure you: Wash your hands with soap and water  for at least 20 seconds before and after you change your bandage (dressing). If soap and water  are not available, use hand sanitizer. Change your dressing and any packing as told by your provider. If the dressing is dry or stuck when you try to remove it, moisten or wet it with saline or water . This will help you remove it without harming your skin or tissues. If your wound is packed, leave it in place until your provider tells you to remove it. To remove it, moisten or wet the packing with saline or water . Leave stitches (sutures), skin glue, or tape strips in place. These skin closures may need to stay in place for 2 weeks or longer. If tape strip edges start to loosen and curl up, you may trim the loose edges. Do not remove tape strips completely unless your provider tells you to do that. Check your wound every day for signs of infection. Check for: More redness, swelling, or pain. More fluid or  blood. Warmth. Pus or a bad smell. If you were sent home with a drain tube in place, follow instructions from your provider about: How to empty it. How to care for it at home. Be careful when you get rid of used dressings, wound packing, or drainage. Activity Rest the affected area. Return to your normal activities as told by your provider. Ask your provider what activities are safe for you. General instructions Do not use any products that contain nicotine  or tobacco. These products include cigarettes, chewing tobacco, and vaping devices, such as e-cigarettes. These can delay incision healing after surgery. If you need help quitting, ask your provider. Do not take baths, swim, or use a hot tub until your provider approves. Ask your provider if you may take showers. You may only be allowed to take sponge baths. The incision will keep draining. It is normal to have some clear or slightly bloody drainage. The amount of drainage should go down each day. Keep all follow-up visits. Your provider will need to make sure that your incision is healing well and that there are no problems. Your health care provider may give you more instructions. Make sure you know what you can and cannot do Contact a health care provider if: Your cyst or abscess comes back. You have any signs of infection. You notice red streaks that spread away from the incision site. You have a fever or chills. Get help right away if: You have severe pain or bleeding.  You become short of breath. You have chest pain. You have signs of a severe infection. You may notice changes in your incision area, such as: Swelling that makes the skin feel hard. Numbness or tingling. Sudden increase in redness. Your skin color may change from red to purple, and then to dark spots. Blisters, ulcers, or splitting of the skin. These symptoms may be an emergency. Get help right away. Call 911. Do not wait to see if the symptoms will go away. Do  not drive yourself to the hospital. This information is not intended to replace advice given to you by your health care provider. Make sure you discuss any questions you have with your health care provider. Document Revised: 08/25/2021 Document Reviewed: 08/25/2021 Elsevier Patient Education  2024 [employer]   Take care and seek immediate care sooner if you develop any concerns. Please remember to show up 15 minutes before your scheduled appointment time!  Gladis Church, DO [facility]

## 2024-02-17 ENCOUNTER — Encounter: Payer: Self-pay | Admitting: Family Medicine

## 2024-02-17 ENCOUNTER — Ambulatory Visit: Admitting: Family Medicine

## 2024-02-17 ENCOUNTER — Other Ambulatory Visit: Payer: Self-pay | Admitting: Family Medicine

## 2024-02-17 VITALS — BP 130/78 | HR 81 | Ht 67.0 in | Wt 244.0 lb

## 2024-02-17 DIAGNOSIS — N3946 Mixed incontinence: Secondary | ICD-10-CM | POA: Diagnosis present

## 2024-02-17 LAB — POCT URINALYSIS DIP (MANUAL ENTRY)
Bilirubin, UA: NEGATIVE
Glucose, UA: 500 mg/dL — AB
Leukocytes, UA: NEGATIVE
Nitrite, UA: NEGATIVE
Protein Ur, POC: 30 mg/dL — AB
Spec Grav, UA: 1.025
Urobilinogen, UA: 1 U/dL
pH, UA: 6

## 2024-02-17 MED ORDER — MIRABEGRON ER 25 MG PO TB24: 25.0000 mg | ORAL_TABLET | Freq: Every day | ORAL | 0 refills | Status: DC

## 2024-02-17 NOTE — Progress Notes (Signed)
" ° ° °  SUBJECTIVE:   CHIEF COMPLAINT / HPI: continued UTI symptoms  Discussed the use of AI scribe software for clinical note transcription with the patient, who gave verbal consent to proceed.  History of Present Illness Barbara Clark is a 49 year old female with recurrent urinary tract infections who presents with urinary incontinence and dark, odorous urine.  Urinary incontinence - Sudden onset of urinary incontinence over the past week with strong urgency and inability to hold urine - Frequent leakage before reaching the bathroom - Uses pads for protection - Voiding or leakage approximately 10-11 times in the morning - Nocturia, awakening twice nightly to urinate - Long-standing stress incontinence with leakage when laughing or coughing - Performs Kegel exercises without perceived benefit  Urinary characteristics - Urine often dark in color - Strong odor present - Believes symptoms are related to dehydration - Has increased fluid intake  Recurrent urinary tract and bacterial infections - History of recurrent urinary tract infections and other bacterial infections - Temporary improvement with prior antibiotic courses - Recurrence of infections after treatment - Concerned about frequency of infections  Associated symptoms and gynecologic history - No dysuria - No current vaginal discharge - No abdominal pain - Past surgical history includes treatment of uterine fibroids and IUD removal - Three prior pregnancies    PERTINENT  PMH / PSH: Type 1 diabetes, uterine fibroids, history of stress incontinence  OBJECTIVE:   BP 130/78   Pulse 81   Ht 5' 7 (1.702 m)   Wt 244 lb (110.7 kg)   LMP 01/27/2024   SpO2 99%   BMI 38.22 kg/m   Physical Exam General: NAD, well appearing Neuro: A&O Respiratory: normal WOB on RA Extremities: Moving all 4 extremities equally Abdomen: soft, non tender, no rebound or guarding   ASSESSMENT/PLAN:   Assessment & Plan Mixed  incontinence urge and stress While it is possible that patient has repeat urinary tract infection,-unlikely based on today's urinalysis and notably the urinalysis earlier in January was significant for epithelial cells suggesting contamination.  Her symptoms sound more like overactive bladder with additional known stress incontinence.  As she has already trialed pelvic floor physical therapy, reasonable to start medical treatment.  Mirabegron  25 mg daily sent to patient's pharmacy.  Patient agreeable to plan.  Will additionally place urology referral after discussion with patient.  Culture obtained today as well.     Return in about 2 weeks (around 03/02/2024) for f/u.  Ozell Provencal, MD, PGY-3 [facility] 5:18 PM 02/17/2024  [facility]   "

## 2024-02-17 NOTE — Assessment & Plan Note (Signed)
 While it is possible that patient has repeat urinary tract infection,-unlikely based on today's urinalysis and notably the urinalysis earlier in January was significant for epithelial cells suggesting contamination.  Her symptoms sound more like overactive bladder with additional known stress incontinence.  As she has already trialed pelvic floor physical therapy, reasonable to start medical treatment.  Mirabegron  25 mg daily sent to patient's pharmacy.  Patient agreeable to plan.  Will additionally place urology referral after discussion with patient.  Culture obtained today as well.

## 2024-02-17 NOTE — Patient Instructions (Addendum)
 It was great to see you! Thank you for allowing me to participate in your care!  Our plans for today:   VISIT SUMMARY: During your visit, we discussed your recent onset of urinary incontinence and dark, odorous urine. We also reviewed your history of recurrent urinary tract infections and other bacterial infections.  YOUR PLAN: MIXED URINARY INCONTINENCE: You have both urgency and stress incontinence, which may be related to bladder irritation, previous surgeries, and multiple births. -We have ordered a urinalysis and urine culture to rule out infection. -I have ordered the medicine Mirabegron . This can help relax the bladder. You will take this once daily. -I will see you again in 2 weeks. -Continue performing Kegel exercises.  POSSIBLE URINARY TRACT INFECTION: Your urinary symptoms, including urgency and dark, malodorous urine, may be due to a urinary tract infection. -We have ordered a urinalysis and urine culture to confirm if you have an infection. -If the culture shows bacteria, we will start you on the appropriate antibiotic treatment.    Contains text generated by [employer].    Please arrive 15 minutes PRIOR to your next scheduled appointment time! If you do not, this affects OTHER patients' care.  Take care and seek immediate care sooner if you develop any concerns.   Ozell Provencal, MD, PGY-3 [facility] 4:58 PM 02/17/2024  [facility]

## 2024-02-23 ENCOUNTER — Ambulatory Visit: Payer: Self-pay | Admitting: Family Medicine

## 2024-02-23 DIAGNOSIS — N3000 Acute cystitis without hematuria: Secondary | ICD-10-CM

## 2024-02-23 LAB — URINE CULTURE

## 2024-02-23 MED ORDER — CEFADROXIL 500 MG PO CAPS: 500.0000 mg | ORAL_CAPSULE | Freq: Two times a day (BID) | ORAL | 0 refills | Status: AC

## 2024-02-23 NOTE — Telephone Encounter (Signed)
 Called patient regarding urinary culture results. Growing E. coli 50,000-100,000 colony-forming units. Given patient's symptoms and growing singular E. coli strain will treat for UTI in addition to treatment plan per last office note. Discussed with patient, Duricef twice daily for 7 days sent to patient's pharmacy.
